# Patient Record
Sex: Male | Born: 1940 | Race: White | Hispanic: No | Smoking: Former smoker
Health system: Southern US, Community
[De-identification: ages and names within clinical notes are randomized; demographics above are authoritative.]

## PROBLEM LIST (undated history)

## (undated) DIAGNOSIS — M1990 Unspecified osteoarthritis, unspecified site: Secondary | ICD-10-CM

## (undated) DIAGNOSIS — C439 Malignant melanoma of skin, unspecified: Secondary | ICD-10-CM

## (undated) DIAGNOSIS — E785 Hyperlipidemia, unspecified: Secondary | ICD-10-CM

## (undated) DIAGNOSIS — C801 Malignant (primary) neoplasm, unspecified: Secondary | ICD-10-CM

## (undated) DIAGNOSIS — F419 Anxiety disorder, unspecified: Secondary | ICD-10-CM

## (undated) DIAGNOSIS — I4821 Permanent atrial fibrillation: Secondary | ICD-10-CM

## (undated) DIAGNOSIS — E663 Overweight: Secondary | ICD-10-CM

## (undated) DIAGNOSIS — K219 Gastro-esophageal reflux disease without esophagitis: Secondary | ICD-10-CM

## (undated) DIAGNOSIS — I700 Atherosclerosis of aorta: Secondary | ICD-10-CM

## (undated) DIAGNOSIS — I10 Essential (primary) hypertension: Secondary | ICD-10-CM

## (undated) DIAGNOSIS — J302 Other seasonal allergic rhinitis: Secondary | ICD-10-CM

## (undated) DIAGNOSIS — I495 Sick sinus syndrome: Secondary | ICD-10-CM

## (undated) HISTORY — DX: Unspecified osteoarthritis, unspecified site: M19.90

## (undated) HISTORY — DX: Gastro-esophageal reflux disease without esophagitis: K21.9

## (undated) HISTORY — PX: ARM SKIN LESION BIOPSY / EXCISION: SUR471

## (undated) HISTORY — DX: Atherosclerosis of aorta: I70.0

## (undated) HISTORY — DX: Permanent atrial fibrillation: I48.21

## (undated) HISTORY — DX: Essential (primary) hypertension: I10

## (undated) HISTORY — DX: Malignant (primary) neoplasm, unspecified: C80.1

## (undated) HISTORY — DX: Other seasonal allergic rhinitis: J30.2

## (undated) HISTORY — DX: Hyperlipidemia, unspecified: E78.5

## (undated) HISTORY — DX: Anxiety disorder, unspecified: F41.9

## (undated) HISTORY — DX: Overweight: E66.3

## (undated) HISTORY — DX: Sick sinus syndrome: I49.5

## (undated) HISTORY — DX: Malignant melanoma of skin, unspecified: C43.9

---

## 1998-10-28 ENCOUNTER — Encounter (INDEPENDENT_AMBULATORY_CARE_PROVIDER_SITE_OTHER): Payer: Self-pay | Admitting: Specialist

## 1998-10-28 ENCOUNTER — Ambulatory Visit (HOSPITAL_COMMUNITY): Admission: RE | Admit: 1998-10-28 | Discharge: 1998-10-28 | Payer: Self-pay | Admitting: Gastroenterology

## 2001-12-19 ENCOUNTER — Ambulatory Visit (HOSPITAL_COMMUNITY): Admission: RE | Admit: 2001-12-19 | Discharge: 2001-12-19 | Payer: Self-pay | Admitting: Gastroenterology

## 2001-12-19 ENCOUNTER — Encounter (INDEPENDENT_AMBULATORY_CARE_PROVIDER_SITE_OTHER): Payer: Self-pay | Admitting: Specialist

## 2003-03-28 ENCOUNTER — Ambulatory Visit (HOSPITAL_COMMUNITY): Admission: RE | Admit: 2003-03-28 | Discharge: 2003-03-29 | Payer: Self-pay | Admitting: Internal Medicine

## 2003-03-28 HISTORY — PX: PACEMAKER INSERTION: SHX728

## 2004-02-07 ENCOUNTER — Ambulatory Visit: Payer: Self-pay | Admitting: Family Medicine

## 2004-03-20 ENCOUNTER — Ambulatory Visit: Payer: Self-pay

## 2004-06-16 ENCOUNTER — Ambulatory Visit: Payer: Self-pay | Admitting: Internal Medicine

## 2004-07-02 ENCOUNTER — Ambulatory Visit: Payer: Self-pay | Admitting: Family Medicine

## 2004-09-02 ENCOUNTER — Ambulatory Visit: Payer: Self-pay | Admitting: Family Medicine

## 2004-09-15 ENCOUNTER — Ambulatory Visit: Payer: Self-pay | Admitting: Internal Medicine

## 2004-10-29 ENCOUNTER — Ambulatory Visit: Payer: Self-pay | Admitting: Family Medicine

## 2004-10-31 ENCOUNTER — Ambulatory Visit: Payer: Self-pay | Admitting: Internal Medicine

## 2004-12-25 ENCOUNTER — Ambulatory Visit: Payer: Self-pay | Admitting: Family Medicine

## 2005-02-02 ENCOUNTER — Ambulatory Visit (HOSPITAL_COMMUNITY): Admission: RE | Admit: 2005-02-02 | Discharge: 2005-02-02 | Payer: Self-pay | Admitting: Gastroenterology

## 2005-02-02 ENCOUNTER — Encounter (INDEPENDENT_AMBULATORY_CARE_PROVIDER_SITE_OTHER): Payer: Self-pay | Admitting: *Deleted

## 2005-03-03 ENCOUNTER — Ambulatory Visit: Payer: Self-pay | Admitting: Family Medicine

## 2005-03-03 ENCOUNTER — Ambulatory Visit: Payer: Self-pay | Admitting: Internal Medicine

## 2005-03-12 ENCOUNTER — Ambulatory Visit (HOSPITAL_COMMUNITY): Admission: RE | Admit: 2005-03-12 | Discharge: 2005-03-12 | Payer: Self-pay | Admitting: Family Medicine

## 2005-03-23 ENCOUNTER — Ambulatory Visit (HOSPITAL_COMMUNITY): Admission: RE | Admit: 2005-03-23 | Discharge: 2005-03-23 | Payer: Self-pay | Admitting: Family Medicine

## 2005-04-01 ENCOUNTER — Ambulatory Visit: Payer: Self-pay | Admitting: Family Medicine

## 2005-06-04 ENCOUNTER — Ambulatory Visit: Payer: Self-pay | Admitting: Internal Medicine

## 2005-08-05 ENCOUNTER — Ambulatory Visit: Payer: Self-pay | Admitting: Family Medicine

## 2005-08-31 ENCOUNTER — Ambulatory Visit: Payer: Self-pay | Admitting: Internal Medicine

## 2005-09-08 ENCOUNTER — Ambulatory Visit: Payer: Self-pay | Admitting: Family Medicine

## 2005-10-06 ENCOUNTER — Ambulatory Visit: Payer: Self-pay | Admitting: Family Medicine

## 2005-10-19 ENCOUNTER — Ambulatory Visit: Payer: Self-pay

## 2006-01-22 ENCOUNTER — Ambulatory Visit: Payer: Self-pay | Admitting: Family Medicine

## 2006-02-04 ENCOUNTER — Ambulatory Visit: Payer: Self-pay | Admitting: Family Medicine

## 2006-02-10 ENCOUNTER — Ambulatory Visit: Payer: Self-pay | Admitting: Family Medicine

## 2006-02-24 ENCOUNTER — Ambulatory Visit: Payer: Self-pay | Admitting: Family Medicine

## 2006-04-13 ENCOUNTER — Ambulatory Visit: Payer: Self-pay | Admitting: Internal Medicine

## 2006-04-16 ENCOUNTER — Ambulatory Visit: Payer: Self-pay

## 2006-05-20 ENCOUNTER — Ambulatory Visit: Payer: Self-pay | Admitting: Internal Medicine

## 2006-07-19 ENCOUNTER — Ambulatory Visit: Payer: Self-pay | Admitting: Internal Medicine

## 2006-08-10 ENCOUNTER — Ambulatory Visit: Payer: Self-pay | Admitting: Internal Medicine

## 2006-08-16 ENCOUNTER — Ambulatory Visit: Payer: Self-pay | Admitting: Internal Medicine

## 2006-09-13 ENCOUNTER — Ambulatory Visit: Payer: Self-pay | Admitting: Internal Medicine

## 2006-10-15 ENCOUNTER — Ambulatory Visit: Payer: Self-pay | Admitting: Internal Medicine

## 2006-11-13 ENCOUNTER — Ambulatory Visit: Payer: Self-pay | Admitting: Internal Medicine

## 2006-12-06 ENCOUNTER — Ambulatory Visit: Payer: Self-pay | Admitting: Internal Medicine

## 2006-12-27 ENCOUNTER — Ambulatory Visit: Payer: Self-pay | Admitting: Internal Medicine

## 2007-01-03 ENCOUNTER — Ambulatory Visit: Payer: Self-pay | Admitting: Internal Medicine

## 2007-01-31 ENCOUNTER — Ambulatory Visit: Payer: Self-pay | Admitting: Internal Medicine

## 2007-02-28 ENCOUNTER — Ambulatory Visit: Payer: Self-pay | Admitting: Internal Medicine

## 2007-03-29 ENCOUNTER — Ambulatory Visit: Payer: Self-pay | Admitting: Internal Medicine

## 2007-07-15 ENCOUNTER — Ambulatory Visit: Payer: Self-pay | Admitting: Internal Medicine

## 2007-10-18 ENCOUNTER — Ambulatory Visit: Payer: Self-pay | Admitting: Internal Medicine

## 2008-01-27 ENCOUNTER — Ambulatory Visit: Payer: Self-pay | Admitting: Internal Medicine

## 2008-02-17 ENCOUNTER — Ambulatory Visit: Payer: Self-pay | Admitting: Internal Medicine

## 2008-06-15 ENCOUNTER — Encounter: Payer: Self-pay | Admitting: Internal Medicine

## 2008-08-10 ENCOUNTER — Ambulatory Visit: Payer: Self-pay | Admitting: Internal Medicine

## 2008-08-17 ENCOUNTER — Ambulatory Visit: Payer: Self-pay | Admitting: Internal Medicine

## 2008-12-05 DIAGNOSIS — Z6833 Body mass index (BMI) 33.0-33.9, adult: Secondary | ICD-10-CM | POA: Insufficient documentation

## 2008-12-05 DIAGNOSIS — I495 Sick sinus syndrome: Secondary | ICD-10-CM | POA: Insufficient documentation

## 2008-12-05 DIAGNOSIS — Z950 Presence of cardiac pacemaker: Secondary | ICD-10-CM | POA: Insufficient documentation

## 2008-12-05 DIAGNOSIS — I10 Essential (primary) hypertension: Secondary | ICD-10-CM | POA: Insufficient documentation

## 2008-12-05 DIAGNOSIS — E663 Overweight: Secondary | ICD-10-CM

## 2008-12-15 ENCOUNTER — Ambulatory Visit: Payer: Self-pay | Admitting: Internal Medicine

## 2008-12-20 ENCOUNTER — Ambulatory Visit: Payer: Self-pay | Admitting: Internal Medicine

## 2009-03-20 ENCOUNTER — Ambulatory Visit: Payer: Self-pay | Admitting: Internal Medicine

## 2009-03-22 ENCOUNTER — Ambulatory Visit (HOSPITAL_COMMUNITY)
Admission: RE | Admit: 2009-03-22 | Discharge: 2009-03-22 | Payer: Self-pay | Source: Home / Self Care | Admitting: Family Medicine

## 2009-04-11 ENCOUNTER — Ambulatory Visit (HOSPITAL_COMMUNITY)
Admission: RE | Admit: 2009-04-11 | Discharge: 2009-04-11 | Payer: Self-pay | Source: Home / Self Care | Admitting: Family Medicine

## 2009-05-01 ENCOUNTER — Encounter: Admission: RE | Admit: 2009-05-01 | Discharge: 2009-05-01 | Payer: Self-pay | Admitting: Gastroenterology

## 2009-06-19 ENCOUNTER — Ambulatory Visit: Payer: Self-pay | Admitting: Internal Medicine

## 2009-09-18 ENCOUNTER — Ambulatory Visit: Payer: Self-pay | Admitting: Internal Medicine

## 2009-11-08 ENCOUNTER — Ambulatory Visit: Payer: Self-pay | Admitting: Cardiology

## 2009-11-20 ENCOUNTER — Encounter: Payer: Self-pay | Admitting: Internal Medicine

## 2009-12-05 ENCOUNTER — Telehealth: Payer: Self-pay | Admitting: Cardiology

## 2009-12-16 ENCOUNTER — Ambulatory Visit: Payer: Self-pay | Admitting: Cardiology

## 2009-12-24 LAB — CONVERTED CEMR LAB
Basophils Absolute: 0 10*3/uL (ref 0.0–0.1)
Eosinophils Absolute: 0.1 10*3/uL (ref 0.0–0.7)
Hemoglobin: 15.5 g/dL (ref 13.0–17.0)
Lymphocytes Relative: 20.3 % (ref 12.0–46.0)
MCHC: 34.9 g/dL (ref 30.0–36.0)
Monocytes Relative: 10.7 % (ref 3.0–12.0)
Neutro Abs: 4.2 10*3/uL (ref 1.4–7.7)
Neutrophils Relative %: 66.4 % (ref 43.0–77.0)
Platelets: 133 10*3/uL — ABNORMAL LOW (ref 150.0–400.0)
RDW: 13.9 % (ref 11.5–14.6)

## 2010-01-02 ENCOUNTER — Encounter: Payer: Self-pay | Admitting: Internal Medicine

## 2010-01-02 ENCOUNTER — Ambulatory Visit: Payer: Self-pay

## 2010-01-02 DIAGNOSIS — I4891 Unspecified atrial fibrillation: Secondary | ICD-10-CM | POA: Insufficient documentation

## 2010-01-03 ENCOUNTER — Encounter (INDEPENDENT_AMBULATORY_CARE_PROVIDER_SITE_OTHER): Payer: Self-pay | Admitting: *Deleted

## 2010-01-18 ENCOUNTER — Ambulatory Visit: Payer: Self-pay | Admitting: Internal Medicine

## 2010-03-30 ENCOUNTER — Encounter: Payer: Self-pay | Admitting: Family Medicine

## 2010-04-08 NOTE — Cardiovascular Report (Signed)
Summary: [facility]/ [facility]  [facility]/ [facility] SUMMARY   Imported By: Dorise Hiss 01/03/2010 12:28:02  _____________________________________________________________________  External Attachment:    Type:   Image     Comment:   External Document

## 2010-04-08 NOTE — Assessment & Plan Note (Signed)
Summary: ESTLAST SEEN 2007  Medications Added COUMADIN 5 MG TABS (WARFARIN SODIUM) as directed AMBIEN 10 MG TABS (ZOLPIDEM TARTRATE) as needed MULTIVITAMINS   TABS (MULTIPLE VITAMIN) 1 by mouth daily      Allergies Added: ! PENICILLIN ! SULFA  Visit Type:  Follow-up Primary Damonta Cossey:  Dr. Ardeen Garland  CC:  Atrial Fibrillation.  History of Present Illness: The patient presents for followup of the above. Since I last saw him and he was last seen by Dr. Graciela Husbands he has had no new cardiovascular problems. He denies any chest pressure, neck or arm discomfort. He has no shortness of breath, PND orthopnea. He is exercising daily. He is watching his diet and has lost 45 pounds. He has rare palpitations but none of the significant symptoms such as he had previously. He is not having any presyncope or syncope. He has had some fluctuating Coumadin levels in some trouble maintaining a therapeutic INR.  Current Medications (verified): 1)  Atenolol 100 Mg Tabs (Atenolol) .... Once Daily 2)  Diovan 80 Mg Tabs (Valsartan) .... Take One Tablet Once Daily 3)  Coumadin 5 Mg Tabs (Warfarin Sodium) .... As Directed 4)  Ambien 10 Mg Tabs (Zolpidem Tartrate) .... As Needed 5)  Multivitamins   Tabs (Multiple Vitamin) .Marland Kitchen.. 1 By Mouth Daily  Allergies (verified): 1)  ! Penicillin 2)  ! Sulfa  Past History:  Past Medical History: Reviewed history from 12/05/2008 and no changes required. 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 Pacer  -- St Jude  Review of Systems       As stated in the HPI and negative for all other systems.   Vital Signs:  Patient profile:   70 year old male Height:      65 inches Weight:      160 pounds BMI:     26.72 Pulse rate:   75 / minute Resp:     16 per minute BP sitting:    108 / 64  (right arm)  Vitals Entered By: Marrion Coy, CNA (November 08, 2009 3:41 PM)  Physical Exam  General:  Well developed, well nourished, in no acute distress. Head:  normocephalic and atraumatic Eyes:  PERRLA/EOM intact; conjunctiva and lids normal. Mouth:  Teeth, gums and palate normal. Oral mucosa normal. Neck:  Neck supple, no JVD. No masses, thyromegaly or abnormal cervical nodes. Chest Wall:  Well-healed pacemaker site Lungs:  Clear bilaterally to auscultation and percussion. Abdomen:  Bowel sounds positive; abdomen soft and non-tender without masses, organomegaly, or hernias noted. No hepatosplenomegaly. Msk:  Back normal, normal gait. Muscle strength and tone normal. Extremities:  No clubbing or cyanosis. Neurologic:  Alert and oriented x 3. Skin:  Intact without lesions or rashes. Cervical Nodes:  no significant adenopathy Inguinal Nodes:  no significant adenopathy Psych:  Normal affect.   Detailed Cardiovascular Exam  Neck    Carotids: Carotids full and equal bilaterally without bruits.      Neck Veins: Normal, no JVD.    Heart    Inspection: no deformities or lifts noted.      Palpation: normal PMI with no thrills palpable.      Auscultation: irregular rate and rhythm, S1, S2 without murmurs, rubs, gallops, or clicks.    Vascular    Abdominal Aorta: no palpable masses, pulsations, or audible bruits.      Femoral Pulses: normal femoral  pulses bilaterally.      Pedal Pulses: diminished right dorsalis pedis pulse, diminished right posterior tibial pulse, diminished left dorsalis pedis pulse, and diminished left posterior tibial pulse.      Radial Pulses: normal radial pulses bilaterally.      Peripheral Circulation: no clubbing, cyanosis, or edema noted with normal capillary refill.     EKG  Procedure date:  11/08/2009  Findings:      Atrial fibrillation, demand ventricular pacemaker, no acute ST-T wave changes  PPM Specifications Following MD:   Sherryl Manges, MD     PPM Vendor:  St Jude     PPM Model Number:  [id number]     PPM Serial Number:  [id number] DOI:  03/28/2003     PPM Implanting MD:  Sherryl Manges, MD  Lead 1    Location: [facility]     DOI: 03/28/1996     Model #: [id number]     Serial #: [id number]     Status: active Lead 2    Location: RV     DOI: 03/28/2003     Model #: 1246T     Serial #: [id number]     Status: active   Indications:  SSS   PPM Follow Up Pacer Dependent:  No      Episodes Coumadin:  Yes  Parameters Mode:  DDDR     Lower Rate Limit:  60     Upper Rate Limit:  105 Paced AV Delay:  275     Sensed AV Delay:  250  Impression & Recommendations:  Problem # 1:  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  I think this would be reasonable. He will check into the cost but his insurance. We will check his renal function. I would then be happy to switch him to this medication as he has otherwise no contraindications. This may be the safer alternative given the difficulty he reports maintaining a therapeutic INR. Orders: EKG w/ Interpretation (93000)  Problem # 2:  PACEMAKER (ICD-V45.Marland Kitchen01) He wants to continue to be followed in the evening and I will arrange this.  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 Make no change to his regimen.  Patient Instructions: 1)  Your physician recommends that you schedule a follow-up appointment in: 12 months with Dr Antoine Poche 2)  Schedule pacer follow up in [facility] office with Dr Johney Frame 3)  Your physician recommends that you continue on your current medications as directed. Please refer to the Current Medication list given to you today.

## 2010-04-08 NOTE — Progress Notes (Signed)
Summary: changed to new meds praxada  Medications Added PRADAXA 150 MG CAPS (DABIGATRAN ETEXILATE MESYLATE) 1 two times a day       Phone Note Call from Patient Call back at Home Phone [phone] Call back at Work Phone [phone]   Caller: Patient Reason for Call: Talk to Nurse Complaint: Headache Summary of Call:  pt wants to changed to new meds that was suggest by jh. pradaxa Initial call taken by: Lorne Skeens,  December 05, 2009 3:39 PM  Follow-up for Phone Call        left message for pt on voicemail that it is ok to start Pradaxa and to call the office back so that we can call in the rx schedule his blood work.  pt returned call -pls call [phone] Glynda Jaeger  December 06, 2009 9:15 AM  Follow-up by: Charolotte Capuchin, RN,  December 05, 2009 6:10 PM  Additional Follow-up for Phone Call Additional follow up Details #1::        lmtcb Scherrie Bateman, LPN  December 06, 2009 10:34 AM  PT AWARE AND LABS  SCHEDULED FOR 12/07/09 AT 9:00 AM Additional Follow-up by: Scherrie Bateman, LPN,  December 06, 2009 10:58 AM    New/Updated Medications: PRADAXA 150 MG CAPS (DABIGATRAN ETEXILATE MESYLATE) 1 two times a day Prescriptions: PRADAXA 150 MG CAPS (DABIGATRAN ETEXILATE MESYLATE) 1 two times a day  #60 x 11   Entered by:   Scherrie Bateman, LPN   Authorized by:   Rollene Rotunda, MD, [facility]   Signed by:   Scherrie Bateman, LPN on 07/37/1062   Method used:   Electronically to        [facility] (retail)       [address]       [place], [place]  [address]       Ph: [phone] or [phone]       Fax: [phone]   RxID:   [id number]

## 2010-04-08 NOTE — Cardiovascular Report (Signed)
Summary: TTM   TTM   Imported By: Roderic Ovens 04/03/2009 10:43:54  _____________________________________________________________________  External Attachment:    Type:   Image     Comment:   External Document

## 2010-04-08 NOTE — Cardiovascular Report (Signed)
Summary: TTM   TTM   Imported By: Roderic Ovens 10/04/2009 08:51:23  _____________________________________________________________________  External Attachment:    Type:   Image     Comment:   External Document

## 2010-04-08 NOTE — Assessment & Plan Note (Signed)
Summary: DEVICE/PT TO SEEN IN [facility]      Allergies Added:   Visit Type:  Pacemaker check Primary Provider:  Dr. Ardeen Garland   History of Present Illness: The patient presents today for routine electrophysiology followup. He reports doing very well since last being seen in our clinic. The patient denies symptoms of palpitations, chest pain, shortness of breath, orthopnea, PND, lower extremity edema, dizziness, presyncope, syncope, or neurologic sequela. The patient is tolerating medications without difficulties and is otherwise without complaint today.   Preventive Screening-Counseling & Management  Alcohol-Tobacco     Smoking Status: quit     Year Quit: 1995  Current Medications (verified): 1)  Atenolol 100 Mg Tabs (Atenolol) .... Once Daily 2)  Diovan 80 Mg Tabs (Valsartan) .... Take One Tablet Once Daily 3)  Pradaxa 150 Mg Caps (Dabigatran Etexilate Mesylate) .Marland Kitchen.. 1 Two Times A Day 4)  Ambien 10 Mg Tabs (Zolpidem Tartrate) .... As Needed 5)  Multivitamins   Tabs (Multiple Vitamin) .Marland Kitchen.. 1 By Mouth Daily  Allergies (verified): 1)  ! Penicillin 2)  ! Sulfa  Comments:  Nurse/Medical Assistant: The patient's medications and allergies were verbally reviewed with the patient and were updated in the Medication and Allergy Lists.  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 Pacer  -- St Jude  Social History: Reviewed history from 12/05/2008 and no changes required. Full Time Widowed  Tobacco Use - No.  Smoking Status:  quit  Review of Systems       All systems are reviewed and negative except as listed in the HPI.   Vital Signs:  Patient profile:   70 year old male Height:      65 inches Weight:       165 pounds Pulse rate:   61 / minute BP sitting:   121 / 79  (left arm) Cuff size:   large  Vitals Entered By: [name] (January 02, 2010 2:22 PM)  Physical Exam  General:  Well developed, well nourished, in no acute distress. Head:  normocephalic and atraumatic Eyes:  PERRLA/EOM intact; conjunctiva and lids normal. Mouth:  Teeth, gums and palate normal. Oral mucosa normal. Neck:  supple Chest Wall:  R sided pacemaker is well healed Lungs:  Clear bilaterally to auscultation and percussion. Heart:  RRR (paced), no m/r/g Abdomen:  Bowel sounds positive; abdomen soft and non-tender without masses, organomegaly, or hernias noted. No hepatosplenomegaly. Msk:  Back normal, normal gait. Muscle strength and tone normal. Pulses:  pulses normal in all 4 extremities Extremities:  No clubbing or cyanosis. Neurologic:  Alert and oriented x 3.   PPM Specifications Following MD:  Sherryl Manges, MD     PPM Vendor:  St Jude     PPM Model Number:  [id number]     PPM Serial Number:  [id number] PPM DOI:  03/28/2003     PPM Implanting MD:  Sherryl Manges, MD  Lead 1    Location: RA     DOI: 03/28/1996     Model #: 1242T     Serial #: [id number]     Status: active Lead 2    Location: RV     DOI: 03/28/2003     Model #: 1246T     Serial #: [id number]     Status: active   Indications:  SSS  PPM Follow Up Battery Voltage:  2.76 V     Battery Est. Longevity:  5-7 yrs     Pacer Dependent:  No       PPM Device Measurements Atrium  Amplitude: 3.9 mV, Impedance: 418 ohms,  Right Ventricle  Amplitude: 16.6 mV, Impedance: 770 ohms, Threshold: 0.375 V at 0.4 msec  Episodes MS Episodes:  18     Percent Mode Switch:  >99%     Coumadin:  No Ventricular High Rate:  0     Ventricular Pacing:  1.1%  Parameters Mode:  DDDR     Lower Rate Limit:  60     Upper Rate Limit:  105 Paced AV Delay:  275     Sensed AV Delay:  250 Next Cardiology Appt Due:  07/08/2010 Tech Comments:  PT IN AF 99% OF TIME. + PRADAXA. NORMAL  DEVICE FUNCTION.  NO CHANGES MADE. ROV IN 6 MTHS W/DEVICE CLINIC. 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

## 2010-04-08 NOTE — Cardiovascular Report (Signed)
Summary: TTM   TTM   Imported By: Roderic Ovens 07/10/2009 09:59:51  _____________________________________________________________________  External Attachment:    Type:   Image     Comment:   External Document

## 2010-04-08 NOTE — Letter (Signed)
Summary: Appointment- Rescheduled  [facility] [address]   [place], [place] [address]   Phone: [phone]  Fax: [phone]     November 20, 2009 MRN: [id number]     Peter Howe [address] [place], [place]  [address]     Dear Mr. PICKUP,   Due to a change in our office schedule, your appointment time on   January 02, 2010 at 1:00 must be changed to January 02, 2010 at 2:15.   We look forward to participating in your health care needs.      Sincerely,  Glass blower/designer

## 2010-04-08 NOTE — Miscellaneous (Signed)
Summary: dx code correction   Clinical Lists Changes  Problems: Changed problem from PACEMAKER (ICD-V45.Marland Kitchen01) to PACEMAKER, PERMANENT (ICD-V45.01)  changed the incorrect dx code to correct dx code Genella Mech  January 03, 2010 11:25 AM

## 2010-04-08 NOTE — Cardiovascular Report (Signed)
Summary: TTM   TTM   Imported By: Roderic Ovens 04/10/2009 10:17:27  _____________________________________________________________________  External Attachment:    Type:   Image     Comment:   External Document

## 2010-05-31 ENCOUNTER — Encounter: Payer: Self-pay | Admitting: Internal Medicine

## 2010-05-31 DIAGNOSIS — I495 Sick sinus syndrome: Secondary | ICD-10-CM

## 2010-07-22 NOTE — Assessment & Plan Note (Signed)
[facility]                         ELECTROPHYSIOLOGY OFFICE NOTE   MARKAIL, DIEKMAN                     MRN:          [id number]  DATE:12/27/2006                            DOB:          11-Oct-1940    SUBJECTIVE:  Mr. Granito is seen for paroxysmal atrial fibrillation and  bradycardia, status post pacemaker implantation.  He has also had  significant fatigue and we tried a variety of different beta blockers  over-the-counter in 2008, and he seemed to like atenolol the best.  He  is currently taking 100 mg daily.   He continues to have short episodes of atrial fibrillation.  They are  modestly symptomatic.   PHYSICAL EXAMINATION:  VITAL SIGNS:  Blood pressure today 120/59, pulse  57.  LUNGS:  Clear.  HEART:  Sounds regular.  EXTREMITIES:  Without edema.   Interrogation of his St. Jude pulse generator demonstrated normal  pacemaker function with a battery voltage of 2.76.  The atrial lead  impedance was 445, ventricular lead impedance was 889.  RA amplitude was  5.8 with an RV amplitude of 16.6 with a threshold in the AV of 1volt at  0.5 and the RV at 0.25 at 0.4.  Battery voltage was 2.76.   IMPRESSION:  1. Paroxysmal atrial fibrillation.  2. Bradycardia.  3. Status post pacer for the above.   PLAN:  I have re-programmed his device to try and track down ventricular  rates during his atrial fibrillation.  I have also told him to take an  extra 1/2 of atenolol, in the event that he has recurrent symptomatic  tachy palpitations.   FOLLOWUP:  We will see him again in six months' time in the Device  Clinic in [place].     Duke Salvia, MD, [facility]  Electronically Signed    SCK/MedQ  DD: 12/27/2006  DT: 12/28/2006  Job #: [id number]   cc:   Delaney Meigs, M.D.

## 2010-07-22 NOTE — Cardiovascular Report (Signed)
[facility]                   [facility] ELECTROPHYSI[facility]Y DEVICE CLINIC NOTE   BILLYJOE, GO                     MRN:          [id number]  DATE:08/10/2008                            DOB:          20-Apr-1940    Mr. Fulop is seen in followup for a pacemaker implanted for sick sinus  syndrome and chronotropic incompetence.  His major complaint is dyspnea  on exertion which he thinks is getting somewhat worse.   MEDICATIONS:  1. Atenolol 100.  2. Diovan 80.  3. Coumadin.  4. Colchicine 0.6.   PHYSICAL EXAMINATION:  VITAL SIGNS:  His weight was 204 which is up 10  pounds, his blood pressure 137/83, and his pulse was 59.  LUNGS:  Clear.  HEART:  Heart sounds were regular.  ABDOMEN:  Soft.  EXTREMITIES:  Without edema.  SKIN:  Warm and dry.   Interrogation of St. Jude pacemaker demonstrated a P-wave of 5.1 with  impedance of 417 and threshold of 0.75 at 0.5.  The R-wave was 16.6 with  impedance of 899 and threshold of 0.25 at 0.5.  Battery voltage is 2.76.  He is atrially paced 100% of the time.  Heart rate excursion was blunted  and 98% of his beats were less than 80 beats per minute.  Because of  this, I decreased his threshold from auto to auto -0.5 and his slope  from auto to auto +1.   IMPRESSION:  1. 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  2. Status post pacer for the above.  3. Hypertension.  4. Obesity with recent increase in weight.   Mr. Schum is doing okay.  I am concerned about his weight.  I am also  concerned about the heart rate response of his pacemaker and he is a  100% Paced.  I do not think changing his beta-blocker will make that  much difference.  To that end, I have reprogrammed his device as noted.  He is to walk around a little bit, and give [place] a call and let us know  how he is doing.  I will plan on seeing him in 6 months' time.     Duke Salvia, MD, [facility]  Electronically Signed   SCK/MedQ  DD: 08/10/2008  DT: 08/11/2008  Job #: [id number]

## 2010-07-22 NOTE — Letter (Signed)
August 10, 2006    Peter Howe, M.D.  [address]  [place], [place] [address]   RE:  Peter Howe, Peter Howe  MRN:  [id number]  /  DOB:  1940-11-07   Dear Peter Howe:   Peter Howe comes in.  He has paroxysmal atrial fibrillation and  hypertension.  He has been having some problems with his medications and  some significant fatigue.  When I saw him last I gave him prescriptions  for alternative beta blockers, of which he tried atenolol only, which  was better than the Toprol.  We also, because of his hypertension, put  him on Diovan, which he is tolerating pretty well at this point.   PHYSICAL EXAMINATION:  VITAL SIGNS:  Blood pressure today was much  better controlled at 138/72, pulse 72.  LUNGS:  Clear.  HEART:  Sounds were regular.  EXTREMITIES:  Without peripheral edema.   Interrogation of his St. Jude Identity pulse generator demonstrates a P-  wave of  3 with impedance of 435 at a threshold of 1 volt at 0.4.  The R-  wave was 8 with impedance of 861, threshold of 0.25 at 0.4.  There were  no inter-current episodes.   IMPRESSION:  1. Atrial fibrillation as in paroxysmal with a rapid ventricular      response.  2. Sinus node dysfunction with previously-implanted pacemaker.  3. Fatigue.  4. Hypertension.  5. Obesity.   PLAN:  Peter Howe secondary risk factor modification is key.  As it  relates to his beta blockers, he seems to be tolerating the atenolol  better than the Toprol.  If the fatigue does not resolve, alternatives  include Inderal and Nadolol or bisoprolol.   His blood pressure is much improved with a diet, and will continue him  on that.   His device was re-programmed.   Will also try to correlate some of his days with his paroxysmal atrial  fibrillation, many of the episodes of which are quite protracted.  He  will take his pulse on a regular basis, and we will plan to see him in  about three or four months and see if there is a correlation between his  atrial  fibrillation and his symptoms.    Sincerely,      Peter Salvia, MD, [facility]  Electronically Signed    SCK/MedQ  DD: 08/10/2006  DT: 08/10/2006  Job #: [id number]

## 2010-07-22 NOTE — Cardiovascular Report (Signed)
[facility]                   [facility] ELECTROPHY[facility] DEVICE CLINIC NOTE   Peter, Howe                     MRN:          [id number]  DATE:07/15/2007                            DOB:          01/25/1941    [name] is seen in followup for pacemaker implanted for bradycardia  that was secondary to paroxysmal atrial fibrillation.  He continues to  have some episodes of atrial fibrillation, indeed 9% of the time.  We do  not have ventricular rates with this.  He does take extra half atenolol  if he is having a prolonged spell.   His other medications include Coumadin and Diovan.   PHYSICAL EXAMINATION:  VITAL SIGNS:  His blood pressure is 134/82.  His  pulse is 60.  LUNGS:  Clear.  HEART:  Sounds were regular.  EXTREMITIES:  Without edema.   Interrogation of his St. Jude Pulse Generator demonstrates a P wave of 5  with impedance of 410, a threshold of 1 volt at 0.4.  The R wave was 16  with impedance of 825, a threshold 0.375 at 0.4.  Battery voltage was  2.76.  He is 99% atrial paced.   IMPRESSION:  1. Tachybrady syndrome.  2. Paroxysmal atrial fibrillation.  3. Status post pacemaker for the above.  4. Hypertension.   [name] is stable.  We will see him again in 6 months' time.     Duke Salvia, MD, [facility]  Electronically Signed    SCK/MedQ  DD: 07/15/2007  DT: 07/15/2007  Job #: [id number]   cc:   Delaney Meigs, M.D.

## 2010-07-24 ENCOUNTER — Encounter: Payer: Self-pay | Admitting: Internal Medicine

## 2010-07-24 ENCOUNTER — Encounter: Payer: Self-pay | Admitting: *Deleted

## 2010-07-24 ENCOUNTER — Ambulatory Visit (INDEPENDENT_AMBULATORY_CARE_PROVIDER_SITE_OTHER): Payer: Medicare Other | Admitting: Internal Medicine

## 2010-07-24 DIAGNOSIS — Z950 Presence of cardiac pacemaker: Secondary | ICD-10-CM

## 2010-07-24 DIAGNOSIS — I4891 Unspecified atrial fibrillation: Secondary | ICD-10-CM

## 2010-07-24 DIAGNOSIS — I10 Essential (primary) hypertension: Secondary | ICD-10-CM

## 2010-07-24 DIAGNOSIS — I495 Sick sinus syndrome: Secondary | ICD-10-CM

## 2010-07-24 NOTE — Assessment & Plan Note (Signed)
Stable No change required today  

## 2010-07-24 NOTE — Assessment & Plan Note (Signed)
Rate controlled permanent atrial fibrillation Continue pradaxa for stroke prevention No changes

## 2010-07-24 NOTE — Assessment & Plan Note (Signed)
Normal pacemaker function See Pace Art report No changes today  

## 2010-07-24 NOTE — Progress Notes (Signed)
The patient presents today for routine electrophysiology followup.  Since last being seen in our clinic, the patient reports doing very well.  Today, he denies symptoms of palpitations, chest pain, shortness of breath, orthopnea, PND, lower extremity edema, dizziness, presyncope, syncope, or neurologic sequela.  The patient feels that he is tolerating medications without difficulties and is otherwise without complaint today.   Past Medical History  Diagnosis Date  . Sick sinus syndrome with tachycardia   . Hypertension     malignant uncontrolled  . Persistent atrial fibrillation     permanent  . Overweight     obesity  . Cardiac pacemaker    Past Surgical History  Procedure Date  . Insert / replace / remove pacemaker     St. Jude-PPM    Current Outpatient Prescriptions  Medication Sig Dispense Refill  . atenolol (TENORMIN) 100 MG tablet Take 100 mg by mouth daily.        . dabigatran (PRADAXA) 150 MG CAPS Take 150 mg by mouth every 12 (twelve) hours.        . Multiple Vitamin (MULTIVITAMIN) tablet Take 1 tablet by mouth daily.        . valsartan (DIOVAN) 80 MG tablet Take 80 mg by mouth daily.        Marland Kitchen zolpidem (AMBIEN) 10 MG tablet Take 10 mg by mouth at bedtime as needed.          Allergies  Allergen Reactions  . Penicillins   . Sulfonamide Derivatives     History   Social History  . Marital Status: Widowed    Spouse Name: N/A    Number of Children: N/A  . Years of Education: N/A   Occupational History  .      FULL TIME   Social History Main Topics  . Smoking status: Former Smoker -- 1.0 packs/day for 30 years    Types: Cigarettes    Quit date: 03/09/1993  . Smokeless tobacco: Not on file  . Alcohol Use: No  . Drug Use: No  . Sexually Active: Not on file   Other Topics Concern  . Not on file   Social History Narrative   Owns a Materials engineer, enjoys golf    Physical Exam: Filed Vitals:   07/24/10 1608  BP: 112/75  Pulse: 67  Height: 5\' 5"  (1.651  m)  Weight: 181 lb (82.101 kg)  SpO2: 97%    GEN- The patient is well appearing, alert and oriented x 3 today.   Head- normocephalic, atraumatic Eyes-  Sclera clear, conjunctiva pink Ears- hearing intact Oropharynx- clear Neck- supple, no JVP Lymph- no cervical lymphadenopathy Lungs- Clear to ausculation bilaterally, normal work of breathing Chest- pacemaker pocket is well healed Heart- Regular rate and rhythm, no murmurs, rubs or gallops, PMI not laterally displaced GI- soft, NT, ND, + BS Extremities- no clubbing, cyanosis, or edema MS- no significant deformity or atrophy Skin- no rash or lesion Psych- euthymic mood, full affect Neuro- strength and sensation are intact  Pacemaker interrogation- reviewed in detail today,  See PACEART report  Assessment and Plan:

## 2010-07-25 NOTE — Op Note (Signed)
Peter Howe, Peter Howe              ACCOUNT NO.:  [id number]   MEDICAL RECORD NO.:  [id number]          PATIENT TYPE:  AMB   LOCATION:  ENDO                         FACILITY:  [facility]   PHYSICIAN:  John C. Madilyn Fireman, M.D.    DATE OF BIRTH:  1940-04-28   DATE OF PROCEDURE:  02/02/2005  DATE OF DISCHARGE:                                 OPERATIVE REPORT   INDICATIONS FOR PROCEDURE:  History of adenomatous colon polyps on previous  study 3 years ago.   PROCEDURE:  The patient was placed in the left lateral decubitus position  then placed on the pulse monitor with continuous low-flow oxygen delivered  by nasal cannula. He was sedated with 50 mcg IV fentanyl and 5 mg IV Versed.  The Olympus video colonoscope was inserted into the rectum and advanced to  the cecum, confirmed by transillumination at McBurney's point and  visualization at the ileocecal valve and appendiceal orifice. The prep was  excellent. The cecum appeared normal with no masses, polyps, diverticula or  other mucosal abnormalities. Within the ascending colon there were seen.  There was an 8 mm polyp that was removed by snare. The remainder of the  ascending, transverse, descending and sigmoid colon appeared normal with no  further polyps, masses, diverticula or other mucosal abnormalities. The  scope was then withdrawn and the patient returned to the recovery room in  stable condition. She tolerated the procedure well and there were no  immediate complications.   IMPRESSION:  Ascending colon polyp, otherwise normal study.   PLAN:  Will await histology and probably repeat colonoscopy in 5 years.           ______________________________  Everardo All Madilyn Fireman, M.D.     JCH/MEDQ  D:  02/02/2005  T:  02/02/2005  Job:  [id number]   cc:   Delaney Meigs, M.D.  Fax: [phone]

## 2010-07-25 NOTE — Op Note (Signed)
   NAME:  Peter Howe, Peter Howe                        ACCOUNT NO.:  [id number]   MEDICAL RECORD NO.:  [id number]                   PATIENT TYPE:  AMB   LOCATION:  ENDO                                 [facility]ACILITY:  [facility]   PHYSICIAN:  John C. Madilyn Fireman, M.D.                 DATE OF BIRTH:  [birth year]   DATE OF PROCEDURE:  12/19/2001  DATE OF DISCHARGE:                                 OPERATIVE REPORT   PROCEDURE:  Colonoscopy with polypectomy.   INDICATIONS FOR PROCEDURE:  History of adenomatous colon polyps three years  ago.   DESCRIPTION OF PROCEDURE:  The patient was placed in the left lateral  decubitus position then placed on the pulse monitor with continuous low flow  oxygen delivered by nasal cannula. He was sedated with 100 mg IV Demerol and  10 mg IV Versed. The Olympus video colonoscope was inserted into the rectum  and advanced as far as possible but despite multiple position changes,  torquing maneuver and abdominal pressure, the cecum could not be reached.  When the scope was advanced to its furthest, I could see the ileocecal valve  in the distance and it was felt that visualization was accomplished to about  the mid ascending colon. The mucosa in this area appeared normal with the  exception of an 8 mm polyp in the distal ascending colon which was removed  by snare. The transverse, descending, sigmoid and rectum appeared normal  with no further polyps, masses, diverticula or other mucosal abnormalities.  The rectum likewise appeared normal and retroflexed view of the anus  revealed no obvious internal hemorrhoids. The colonoscope was then withdrawn  and the patient returned to the recovery room in stable condition. He  tolerated the procedure well and there were no immediate complications.   IMPRESSION:  1. Ascending colon polyp.  2. Incomplete colonoscopy with failure to see the proximal ascending colon     and the cecum.   PLAN:  Await histology and will probably repeat  colonoscopy in three years.                                               John C. Madilyn Fireman, M.D.    JCH/MEDQ  D:  12/19/2001  T:  12/19/2001  Job:  [id number]

## 2010-07-25 NOTE — Letter (Signed)
April 13, 2006    Delaney Meigs, M.D.  [address]  [place], [place] [address]   RE:  ILIAN, WESSELL  MRN:  [id number]  /  DOB:  04-07-1940   Dear Dr. Lysbeth Galas:   Mr. Pitsenbarger comes in.  He had a spell the other day where while playing  golf he was lightheaded.  He was having prolonged indigestion and  irregular heart beats.  There was a spell while he was driving where he  became a little bit confused as to his location.   He has no known coronary artery disease.  He does have hypertension.  His last Cardiolite was about 6 or 7 years ago.   These episodes of indigestion occur periodically and they seem to track  with his rapid heart beats.   Interrogation of his pacemaker demonstrated that he has had a mode  switch about 5.9% of the time and that his ventricular response in the  one electrogram that we have runs about 150 beats per minute.   Reviewing his medications demonstrates that he is no longer on an ARB.  Apparently he developed a dermatitis that was felt to be a drug-related  process.  Medications were gradually eliminated and he is currently only  taking Coumadin, Toprol at 50 mg, and Zegerid.   PHYSICAL EXAMINATION:  VITAL SIGNS:  On examination today, his blood  pressure was pretty good at 120/70 and his pulse is 60.  LUNGS:  Clear.  CARDIAC:  Heart sounds were regular.  EXTREMITIES:  Without edema.   Interrogation of his St. Jude pulse generator demonstrated an R wave of  12.2 and an impedance of 825, a threshold of 0.25 at 0.4 with a P wave  of 5, an impedance of 406, and a threshold of 1 V at 0.4.  Battery  voltage is 2.76.   IMPRESSION:  1. Paroxysmal atrial fibrillation with lightheadedness, shortness of      breath and chest discomfort.  2. Bradycardia, status post pacemaker implantation.  3. Obesity.  4. Hypertension.   Mr. Mcphearson has multiple risk factors.  We do not have recent lipids, but  he otherwise meets criteria for metabolic syndrome.  I  should note  parenthetically, in 1998 his HDL was 90.  Given his chest discomfort  which he describes as indigestion associated with these rapid spells, I  think a Myoview scan is appropriate with these risk factors.   I have also increased his Toprol from 50 mg to 100 mg a day.  I would  like to see him again in 5 weeks' time to see how it is that he is doing  both symptomatically as well as by interrogation of his device, looking  at the rates of his ventricular response.    Sincerely,      Duke Salvia, MD, [facility]  Electronically Signed    SCK/MedQ  DD: 04/13/2006  DT: 04/13/2006  Job #: [id number]

## 2010-07-25 NOTE — Op Note (Signed)
NAME:  Peter Howe, Peter Howe                        ACCOUNT NO.:  [id number]   MEDICAL RECORD NO.:  [id number]                   PATIENT TYPE:  OIB   LOCATION:  3731                                 FACILITY:  [facility]   PHYSICIAN:  Duke Salvia, M.D.               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   OPERATION PERFORMED:  Explantation of a previously implanted device,  implantation of a new pulse generator with insertion of a new ventricular  lead.   DESCRIPTION OF PROCEDURE:  Following the obtaining of informed consent, the  patient was brought to the electrophysiology laboratory and placed on the  fluoroscopic table in supine position.  After routine prep and drape,  lidocaine was infiltrated along the line of the previous incision and  carried down to the layer of the pacemaker pocket using sharp dissection and  electrocautery.  The pocket was opened and with some difficulty, the  pacemaker device was explanted.  While the head had secured medially with a  very loose suture so that the device had rotated 180 degrees and the leads  were at the caudal portion of the pocket, thus freeing up the leads was a  little bit difficult.  Upon inspection, it was clear that there was old  blood in the ventricular lead which was visible over the entire portion of  the visible lead.  At this point it was elected to insert a new ventricular  lead.  At that point attention was turned to gaining access to the  extrathoracic right subclavian vein.  Using the previously implanted leads  as markers, the vein was cannulated on a number of occasions;  however, wires  could not be passed past the midportion of the superior vena cava because  there was a cephalad direction of the junction of the subclavian vein and  the SVC, such that the wire could go up into the bulb as it were, curl up  and I could not thereafter advance the wire.  We tried a Scientist, research (medical), a  standard 035 wire and then a __________ wire and ultimately I was able to  get the __________ wire into the RV with the coil __________  as I went  through the lead the loop was removed.  We then put in a 7 [place] hemostatic  tear-away sheath and through this was placed a St. Jude 52 cm passive  fixation ventricular lead, serial [id number].  Under fluoroscopic guidance it  was manipulated to the right ventricular apex which was actually about 1 cm  lateral to the previously implanted lead.  In this location the bipolar R  wave was 17.5 mV with pacing impedance of 1024 ohms, pacing threshold of 0.5  V at 0.5 msec with a current at threshold of 0.4 MA.  There is no  diaphragmatic pacing at 10 volts.  This lead was then secured to the  prepectoral fascia.  The pocket had to be expanded laterally to allow access  to the vein and hemostasis was then obtained.  The leads were then attached  to a St. Jude Identity XL pulse generator, serial number [id number].  AV pacing  with pseudofusion was identified. The pocket was copiously irrigated with  antibiotic containing saline solution.  Hemostasis was assured and great  attention was given to this, given that the patient's INR was 2.3.  The  pocket was then closed in three layers in  normal fashion. The wound was washed, dried and benzoin and Steri-Strip  dressing was applied.  Sponge, needle and instrument counts were correct at  the end of the procedure according to the staff.  The patient tolerated the  procedure without apparent complication.                                               Duke Salvia, M.D.    SCK/MEDQ  D:  03/28/2003   T:  03/28/2003  Job:  [id number]   cc:   [facility]   Delaney Meigs, M.D.  [address]  [place]  [place] [address]  Fax: [phone]

## 2010-07-25 NOTE — Discharge Summary (Signed)
NAME:  Peter Howe, Peter Howe                        ACCOUNT NO.:  [id number]   MEDICAL RECORD NO.:  [id number]                   PATIENT TYPE:  OIB   LOCATION:  3731                                 FACILITY:  [facility]   PHYSICIAN:  Duke Salvia, M.D.               DATE OF BIRTH:  Apr 25, 1940   DATE OF ADMISSION:  03/28/2003  DATE OF DISCHARGE:  03/29/2003                                 DISCHARGE SUMMARY   PRIMARY DIAGNOSIS:  Pacemaker placement at __________   SECONDARY DIAGNOSES:  1. Tachy/brady syndrome, status post St. Jude pacemaker.  2. Atrial fibrillation.   HISTORY OF PRESENT ILLNESS:  This is a 70 year old gentleman with a past  medical history as stated above, who is status post pacer implant for  bradycardia in the context of PAF.  He has thromboembolic risk factors  notable for hypertension and takes Coumadin therapy.   HOSPITAL COURSE:  He was admitted for a generator change.  Also, the patient  had a right ventricular lead revision.  He tolerated the procedure well and  had no immediate complications.  He was discharged to home the following day  in stable condition on all of his previous medications.   DISCHARGE MEDICATIONS:  1. Toprol XL 75 mg daily.  2. Avapro 150 mg daily.  3. Coumadin 5 mg nightly.  4. Multivitamins daily.  5. Lipitor 5 mg nightly.  6. Aciphex 20 mg daily.  7. Tylenol one to two tablets every four to six hours as needed.   ACTIVITY:  Per pacemaker discharge sheet.  The patient was restricted from  driving for approximately 10 days.   WOUND CARE:  Per pacemaker discharge sheet.   FOLLOWUP:  He is to follow up at the pacemaker clinic at [facility] on April 12, 2003, at 9 a.m. and with Dr. Graciela Husbands on June 29, 2003, at 10:45 a.m.      Chinita Pester, C.R.N.P. [facility]                 Duke Salvia, M.D.    DS/MEDQ  D:  03/29/2003  T:  03/30/2003  Job:  [id number]   cc:   Delaney Meigs, M.D.  [address]  [place]  [place] [address]  Fax:  [phone]

## 2010-07-25 NOTE — Assessment & Plan Note (Signed)
[facility]                           ELECTROPHYSIOLOGY OFFICE NOTE   KERVENS, ROPER                     MRN:          [id number]  DATE:10/19/2005                            DOB:          [birth year]    Peter Howe was seen on October 19, 2005, in the clinic for followup of his  St. Jude Model No. [id number] Identity.  Date of implant was March 28, 2003, for  sick sinus syndrome.  On interrogation of his device today, his battery  voltage was 2.76.  P waves measured greater than 5 mV with an atrial capture  threshold of 1 V at 0.4 msec and an atrial lead impedence of 418 ohms.  R  waves measured greater than 12.5 mV with a ventricular pacing threshold of  0.25 mV at 0.4 msec and a ventricular lead impedence of 785 ohms.  There  were 2463 mode switches noted totally 4.5% of the time and no changes were  made in his parameters today and auto capture is on.  He does do  transtelephonic monitoring on a monthly basis and will continue with a  return office visit in 1 year's time.                                   Altha Harm, LPN                                Duke Salvia, MD, [facility]   PO/MedQ  DD:  10/19/2005  DT:  10/19/2005  Job #:  [id number]

## 2010-07-25 NOTE — Assessment & Plan Note (Signed)
[facility]                         ELECTROPHYSIOLOGY OFFICE NOTE   Peter Howe, Peter Howe                     MRN:          [id number]  DATE:05/20/2006                            DOB:          Jan 04, 1941    Mr. Barocio is seen for paroxysmal atrial fibrillation.  He was having a  drug reaction before, and so his medications were stopped including  Avapro.  We increased his Toprol from 50 to 100 mg a day, and he is  feeling some better on the current medical regimen except he is more  tired.  A review of his medicines demonstrates that he is currently on  metoprolol now at 100 mg a day, Prilosec and Coumadin.   PHYSICAL EXAMINATION:  VITAL SIGNS: Blood pressure 160/88 which is high  for him.  Pulse is 59.  Weight was 205.  LUNGS:  Clear.  CARDIAC:  Heart sounds were regular.   Interrogation of his pacemaker demonstrates that he is in atrial  fibrillation about 6.9% of the time, and the episodes of atrial high  rate are consistent with ventricular high rate at the same time.   IMPRESSION:  1. Paroxysmal atrial fibrillation.  2. Bradycardia status post pacer.  3. Drug reaction, question medication, question Avapro, question      generic metoprolol succinate.  4. Hypertension.  Worse control since discontinuation of Avapro.   We will plan to give Mr. Aiello a prescription for Diovan 80, not to  take yet but to go and get his blood pressure checked at the local  pharmacy and see if it is running high.  If it is running consistently  over 140, I would like him to begin the Diovan 80 and then to follow up  with Dr. Lysbeth Galas in about two weeks' time.   He is going to continue on his current dose of Toprol to see if he can  adjust to the fatigogenic component; in the event that he does not, I  have given him a prescription for atenolol 100 and Inderal LA 120 to  take as an alternative.  He is to let us know if he changes his Toprol  to either one of  these 2-week prescriptions.   I will see him again in 12 weeks' time.     Duke Salvia, MD, [facility]  Electronically Signed   SCK/MedQ  DD: 05/20/2006  DT: 05/22/2006  Job #: [id number]   cc:   Delaney Meigs, M.D.

## 2010-10-04 ENCOUNTER — Encounter: Payer: Self-pay | Admitting: Internal Medicine

## 2010-10-04 DIAGNOSIS — I495 Sick sinus syndrome: Secondary | ICD-10-CM

## 2010-11-11 ENCOUNTER — Encounter: Payer: Self-pay | Admitting: Cardiology

## 2010-11-12 ENCOUNTER — Ambulatory Visit (INDEPENDENT_AMBULATORY_CARE_PROVIDER_SITE_OTHER): Payer: Medicare Other | Admitting: Cardiology

## 2010-11-12 ENCOUNTER — Encounter: Payer: Self-pay | Admitting: Cardiology

## 2010-11-12 DIAGNOSIS — E663 Overweight: Secondary | ICD-10-CM

## 2010-11-12 DIAGNOSIS — I4891 Unspecified atrial fibrillation: Secondary | ICD-10-CM

## 2010-11-12 DIAGNOSIS — I10 Essential (primary) hypertension: Secondary | ICD-10-CM

## 2010-11-12 NOTE — Patient Instructions (Addendum)
Follow up as needed with Dr Antoine Poche  The current medical regimen is effective;  continue present plan and medications.

## 2010-11-12 NOTE — Assessment & Plan Note (Signed)
I discussed his weight with him and we discussed the importance of exercise and calorie control.

## 2010-11-12 NOTE — Assessment & Plan Note (Signed)
The blood pressure is at target. No change in medications is indicated. We will continue with therapeutic lifestyle changes (TLC).  

## 2010-11-12 NOTE — Assessment & Plan Note (Signed)
He tolerates this rhythm and rate control and anticoagulation. We will continue with the meds as listed.  He can follow with Dr. Johney Frame since afib and a pacemaker is the only active issue.

## 2010-11-12 NOTE — Progress Notes (Signed)
HPI The patient presents for followup of hypertension and tachycardia bradycardia syndrome. He does not notice that he is in atrial fibrillation. He tolerates Pradaxa.  He denies any chest pressure, neck or arm discomfort. He has no shortness of breath, PND or orthopnea. He does exercise 3 times per week. His weight is up and he admits to some indiscriminate eating.  Allergies  Allergen Reactions  . Penicillins   . Sulfonamide Derivatives     Current Outpatient Prescriptions  Medication Sig Dispense Refill  . atenolol (TENORMIN) 100 MG tablet Take 100 mg by mouth daily.        . dabigatran (PRADAXA) 150 MG CAPS Take 150 mg by mouth every 12 (twelve) hours.        . Multiple Vitamin (MULTIVITAMIN) tablet Take 1 tablet by mouth daily.        . valsartan (DIOVAN) 80 MG tablet Take 80 mg by mouth daily.        Marland Kitchen zolpidem (AMBIEN) 10 MG tablet Take 10 mg by mouth at bedtime as needed.          Past Medical History  Diagnosis Date  . Sick sinus syndrome with tachycardia   . Hypertension     malignant uncontrolled  . Persistent atrial fibrillation     permanent  . Overweight     obesity  . Cardiac pacemaker     Past Surgical History  Procedure Date  . Insert / replace / remove pacemaker     St. Jude-PPM    ROS:  As stated in the HPI and negative for all other systems.  PHYSICAL EXAM BP 130/88  Pulse 72  Resp 18  Ht 5\' 5"  (1.651 m)  Wt 188 lb (85.276 kg)  BMI 31.28 kg/m2 GENERAL:  Well appearing HEENT:  Pupils equal round and reactive, fundi not visualized, oral mucosa unremarkable, dentures NECK:  No jugular venous distention, waveform within normal limits, carotid upstroke brisk and symmetric, no bruits, no thyromegaly LYMPHATICS:  No cervical, inguinal adenopathy LUNGS:  Clear to auscultation bilaterally BACK:  No CVA tenderness CHEST:  Right pacemaker pocket HEART:  PMI not displaced or sustained,S1 and S2 within normal limits, no S3, no S4, no clicks, no rubs, no  murmurs, irregular ABD:  Flat, positive bowel sounds normal in frequency in pitch, no bruits, no rebound, no guarding, no midline pulsatile mass, no hepatomegaly, no splenomegaly, obese EXT:  2 plus pulses throughout, no edema, no cyanosis no clubbing SKIN:  No rashes no nodules NEURO:  Cranial nerves II through XII grossly intact, motor grossly intact throughout PSYCH:  Cognitively intact, oriented to person place and time  EKG:  Atrial fibrillation, demand pacemaker  ASSESSMENT AND PLAN

## 2010-12-11 ENCOUNTER — Other Ambulatory Visit: Payer: Self-pay

## 2010-12-11 MED ORDER — DABIGATRAN ETEXILATE MESYLATE 150 MG PO CAPS: 150.0000 mg | ORAL_CAPSULE | Freq: Two times a day (BID) | ORAL | Status: DC

## 2011-01-19 ENCOUNTER — Other Ambulatory Visit: Payer: Self-pay

## 2011-01-19 MED ORDER — DABIGATRAN ETEXILATE MESYLATE 150 MG PO CAPS: 150.0000 mg | ORAL_CAPSULE | Freq: Two times a day (BID) | ORAL | Status: DC

## 2011-01-19 NOTE — Telephone Encounter (Signed)
.   Requested Prescriptions   Signed Prescriptions Disp Refills  . dabigatran (PRADAXA) 150 MG CAPS 60 capsule 11    Sig: Take 1 capsule (150 mg total) by mouth 2 (two) times daily.    Authorizing Provider: Rollene Rotunda    Ordering User: Lacie Scotts

## 2011-01-20 ENCOUNTER — Ambulatory Visit (INDEPENDENT_AMBULATORY_CARE_PROVIDER_SITE_OTHER): Payer: Medicare Other | Admitting: *Deleted

## 2011-01-20 DIAGNOSIS — I495 Sick sinus syndrome: Secondary | ICD-10-CM

## 2011-01-20 LAB — PACEMAKER DEVICE OBSERVATION
BAMS-0001: 180 {beats}/min
BAMS-0003: 60 {beats}/min
BATTERY VOLTAGE: 2.76 V
BRDY-0002RV: 60 {beats}/min
BRDY-0003RV: 105 {beats}/min
BRDY-0004RV: 115 {beats}/min
DEVICE MODEL PM: 927381
RV LEAD AMPLITUDE: 16.6 mv
RV LEAD THRESHOLD: 0.25 V

## 2011-01-20 NOTE — Progress Notes (Signed)
PPM check 

## 2011-02-25 ENCOUNTER — Encounter: Payer: Self-pay | Admitting: Internal Medicine

## 2011-04-22 ENCOUNTER — Encounter: Payer: Self-pay | Admitting: Internal Medicine

## 2011-04-22 DIAGNOSIS — I495 Sick sinus syndrome: Secondary | ICD-10-CM

## 2011-04-23 ENCOUNTER — Telehealth: Payer: Self-pay | Admitting: Internal Medicine

## 2011-04-23 NOTE — Telephone Encounter (Signed)
04-23-11 lmm @ 921a for pt to set up pacer ck with device to get it reprogrammed, next available/mt

## 2011-05-13 ENCOUNTER — Encounter: Payer: Self-pay | Admitting: Internal Medicine

## 2011-05-13 ENCOUNTER — Ambulatory Visit (INDEPENDENT_AMBULATORY_CARE_PROVIDER_SITE_OTHER): Payer: Medicare Other | Admitting: *Deleted

## 2011-05-13 DIAGNOSIS — Z950 Presence of cardiac pacemaker: Secondary | ICD-10-CM

## 2011-05-13 DIAGNOSIS — I495 Sick sinus syndrome: Secondary | ICD-10-CM

## 2011-05-13 DIAGNOSIS — I4891 Unspecified atrial fibrillation: Secondary | ICD-10-CM

## 2011-05-13 LAB — PACEMAKER DEVICE OBSERVATION
BAMS-0001: 180 {beats}/min
BAMS-0003: 60 {beats}/min
BATTERY VOLTAGE: 2.76 V
BRDY-0003RV: 105 {beats}/min
RV LEAD AMPLITUDE: 16.6 mv
RV LEAD THRESHOLD: 0.5 V

## 2011-05-13 NOTE — Progress Notes (Signed)
Pacer check in clinic  

## 2011-09-03 ENCOUNTER — Encounter: Payer: Self-pay | Admitting: Internal Medicine

## 2011-09-03 ENCOUNTER — Ambulatory Visit (INDEPENDENT_AMBULATORY_CARE_PROVIDER_SITE_OTHER): Payer: Medicare Other | Admitting: Internal Medicine

## 2011-09-03 VITALS — BP 119/80 | HR 72 | Resp 16 | Ht 64.0 in | Wt 198.0 lb

## 2011-09-03 DIAGNOSIS — E663 Overweight: Secondary | ICD-10-CM

## 2011-09-03 DIAGNOSIS — I4891 Unspecified atrial fibrillation: Secondary | ICD-10-CM

## 2011-09-03 DIAGNOSIS — I10 Essential (primary) hypertension: Secondary | ICD-10-CM

## 2011-09-03 DIAGNOSIS — I495 Sick sinus syndrome: Secondary | ICD-10-CM

## 2011-09-03 LAB — PACEMAKER DEVICE OBSERVATION
AL IMPEDENCE PM: 409 Ohm
BAMS-0001: 180 {beats}/min
BAMS-0003: 60 {beats}/min
BATTERY VOLTAGE: 2.76 V
RV LEAD AMPLITUDE: 16.6 mv
RV LEAD THRESHOLD: 0.5 V

## 2011-09-03 NOTE — Assessment & Plan Note (Signed)
Asymptomatic and rate controlled He is adequately anticoagulated with pradaxa I informed him that creatinine clearance should be checked at least once per year for dose adjustment of pradaxa.  He wishes to have this followed by Dr Lysbeth Galas.

## 2011-09-03 NOTE — Assessment & Plan Note (Signed)
Weight loss advised 

## 2011-09-03 NOTE — Assessment & Plan Note (Signed)
Stable No change required today  

## 2011-09-03 NOTE — Progress Notes (Signed)
PCP: Josue Hector, MD Primary Cardiologist:  Peter Howe is a 71 y.o. male who presents today for routine electrophysiology followup.  Since last being seen in our clinic, the patient reports doing very well.  Today, he denies symptoms of palpitations, chest pain, shortness of breath,  lower extremity edema, dizziness, presyncope, or syncope.  The patient is otherwise without complaint today.   Past Medical History  Diagnosis Date  . Tachycardia-bradycardia     s/p PPM  . Hypertension   . Permanent atrial fibrillation   . Overweight     obesity   Past Surgical History  Procedure Date  . Pacemaker insertion 03/28/03    St. Jude-PPM    Current Outpatient Prescriptions  Medication Sig Dispense Refill  . atenolol (TENORMIN) 100 MG tablet Take 100 mg by mouth daily.        . dabigatran (PRADAXA) 150 MG CAPS Take 1 capsule (150 mg total) by mouth 2 (two) times daily.  60 capsule  11  . Multiple Vitamin (MULTIVITAMIN) tablet Take 1 tablet by mouth daily.        . valsartan (DIOVAN) 80 MG tablet Take 80 mg by mouth daily.        Marland Kitchen zolpidem (AMBIEN) 10 MG tablet Take 10 mg by mouth at bedtime as needed.          Physical Exam: Filed Vitals:   09/03/11 0903  BP: 119/80  Pulse: 72  Resp: 16  Height: 5\' 4"  (1.626 m)  Weight: 198 lb (89.812 kg)    GEN- The patient is well appearing, alert and oriented x 3 today.   Head- normocephalic, atraumatic Eyes-  Sclera clear, conjunctiva pink Ears- hearing intact Oropharynx- clear Lungs- Clear to ausculation bilaterally, normal work of breathing Chest- pacemaker pocket is well healed Heart- Regular rate and rhythm, no murmurs, rubs or gallops, PMI not laterally displaced GI- soft, NT, ND, + BS Extremities- no clubbing, cyanosis, or edema  Pacemaker interrogation- reviewed in detail today,  See PACEART report  Assessment and Plan:

## 2011-09-03 NOTE — Assessment & Plan Note (Signed)
Normal pacemaker function See Peter Howe report Given permanent afib, will reprogram VVIR today to promote battery longevity.

## 2011-10-29 DIAGNOSIS — I495 Sick sinus syndrome: Secondary | ICD-10-CM

## 2012-01-25 ENCOUNTER — Other Ambulatory Visit: Payer: Self-pay | Admitting: Cardiology

## 2012-01-25 NOTE — Telephone Encounter (Signed)
..   Requested Prescriptions   Pending Prescriptions Disp Refills  . PRADAXA 150 MG CAPS [Pharmacy Med Name: PRADAXA 150 MG CAPSULE] 60 capsule 11    Sig: TAKE ONE TABLET TWICE DAILY

## 2012-04-01 DIAGNOSIS — I495 Sick sinus syndrome: Secondary | ICD-10-CM

## 2012-07-01 DIAGNOSIS — I495 Sick sinus syndrome: Secondary | ICD-10-CM

## 2012-08-11 ENCOUNTER — Encounter: Payer: Self-pay | Admitting: Internal Medicine

## 2012-08-15 ENCOUNTER — Emergency Department (HOSPITAL_COMMUNITY)
Admission: EM | Admit: 2012-08-15 | Discharge: 2012-08-15 | Disposition: A | Discharge status: Home / Self Care | Payer: Medicare Other | Attending: Emergency Medicine | Admitting: Emergency Medicine

## 2012-08-15 ENCOUNTER — Emergency Department (HOSPITAL_COMMUNITY): Payer: Medicare Other

## 2012-08-15 ENCOUNTER — Encounter (HOSPITAL_COMMUNITY): Payer: Self-pay

## 2012-08-15 DIAGNOSIS — Z87891 Personal history of nicotine dependence: Secondary | ICD-10-CM | POA: Insufficient documentation

## 2012-08-15 DIAGNOSIS — T07XXXA Unspecified multiple injuries, initial encounter: Secondary | ICD-10-CM

## 2012-08-15 DIAGNOSIS — W19XXXA Unspecified fall, initial encounter: Secondary | ICD-10-CM

## 2012-08-15 DIAGNOSIS — IMO0002 Reserved for concepts with insufficient information to code with codable children: Secondary | ICD-10-CM | POA: Insufficient documentation

## 2012-08-15 DIAGNOSIS — E669 Obesity, unspecified: Secondary | ICD-10-CM | POA: Insufficient documentation

## 2012-08-15 DIAGNOSIS — W1809XA Striking against other object with subsequent fall, initial encounter: Secondary | ICD-10-CM | POA: Insufficient documentation

## 2012-08-15 DIAGNOSIS — I10 Essential (primary) hypertension: Secondary | ICD-10-CM | POA: Insufficient documentation

## 2012-08-15 DIAGNOSIS — Z79899 Other long term (current) drug therapy: Secondary | ICD-10-CM | POA: Insufficient documentation

## 2012-08-15 DIAGNOSIS — Z8679 Personal history of other diseases of the circulatory system: Secondary | ICD-10-CM | POA: Insufficient documentation

## 2012-08-15 DIAGNOSIS — Z880 Allergy status to penicillin: Secondary | ICD-10-CM | POA: Insufficient documentation

## 2012-08-15 DIAGNOSIS — Y9389 Activity, other specified: Secondary | ICD-10-CM | POA: Insufficient documentation

## 2012-08-15 DIAGNOSIS — Y9289 Other specified places as the place of occurrence of the external cause: Secondary | ICD-10-CM | POA: Insufficient documentation

## 2012-08-15 LAB — CBC WITH DIFFERENTIAL/PLATELET
Basophils Absolute: 0 10*3/uL (ref 0.0–0.1)
Basophils Relative: 0 % (ref 0–1)
Eosinophils Relative: 2 % (ref 0–5)
Lymphocytes Relative: 21 % (ref 12–46)
MCHC: 34.2 g/dL (ref 30.0–36.0)
MCV: 98.4 fL (ref 78.0–100.0)
Neutro Abs: 5.1 10*3/uL (ref 1.7–7.7)
Platelets: 157 10*3/uL (ref 150–400)
RDW: 13.3 % (ref 11.5–15.5)
WBC: 7.7 10*3/uL (ref 4.0–10.5)

## 2012-08-15 LAB — URINALYSIS, ROUTINE W REFLEX MICROSCOPIC
Bilirubin Urine: NEGATIVE
Ketones, ur: NEGATIVE mg/dL
Nitrite: NEGATIVE
Protein, ur: NEGATIVE mg/dL
Specific Gravity, Urine: <1.005 — ABNORMAL LOW (ref 1.005–1.030)
Urobilinogen, UA: 0.2 mg/dL (ref 0.0–1.0)

## 2012-08-15 LAB — BASIC METABOLIC PANEL
BUN: 14 mg/dL (ref 6–23)
Calcium: 10 mg/dL (ref 8.4–10.5)
GFR calc Af Amer: 72 mL/min — ABNORMAL LOW (ref >90)
GFR calc non Af Amer: 62 mL/min — ABNORMAL LOW (ref >90)
Glucose, Bld: 129 mg/dL — ABNORMAL HIGH (ref 70–99)
Potassium: 4.1 mEq/L (ref 3.5–5.1)

## 2012-08-15 LAB — PROTIME-INR: Prothrombin Time: 19.2 seconds — ABNORMAL HIGH (ref 11.6–15.2)

## 2012-08-15 MED ORDER — LORAZEPAM 2 MG/ML IJ SOLN
1.0000 mg | Freq: Once | INTRAMUSCULAR | Status: AC
  Administered 2012-08-15: 1 mg via INTRAVENOUS
  Filled 2012-08-15: qty 1

## 2012-08-15 MED ORDER — HYDROCODONE-ACETAMINOPHEN 5-325 MG PO TABS: 2.0000 | ORAL_TABLET | ORAL | Status: DC | PRN

## 2012-08-15 MED ORDER — IOHEXOL 300 MG/ML  SOLN: 100.0000 mL | Freq: Once | INTRAMUSCULAR | Status: DC | PRN
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
## 2012-08-15 NOTE — ED Notes (Signed)
Pt unable to complete ct scan, will be given ativan and attempt the scans later.

## 2012-08-15 NOTE — ED Notes (Signed)
Notified pt of need for urine, verbalized understanding.  Family at bedside.
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
## 2012-08-31 ENCOUNTER — Encounter: Payer: Self-pay | Admitting: *Deleted

## 2012-08-31 ENCOUNTER — Encounter: Payer: Self-pay | Admitting: Internal Medicine

## 2012-08-31 ENCOUNTER — Ambulatory Visit (INDEPENDENT_AMBULATORY_CARE_PROVIDER_SITE_OTHER): Payer: Medicare Other | Admitting: Internal Medicine

## 2012-08-31 ENCOUNTER — Telehealth: Payer: Self-pay | Admitting: Internal Medicine

## 2012-08-31 ENCOUNTER — Other Ambulatory Visit: Payer: Self-pay | Admitting: *Deleted

## 2012-08-31 VITALS — BP 122/82 | HR 77 | Ht 64.0 in | Wt 202.0 lb

## 2012-08-31 DIAGNOSIS — E663 Overweight: Secondary | ICD-10-CM

## 2012-08-31 DIAGNOSIS — I4891 Unspecified atrial fibrillation: Secondary | ICD-10-CM

## 2012-08-31 DIAGNOSIS — R0602 Shortness of breath: Secondary | ICD-10-CM | POA: Insufficient documentation

## 2012-08-31 DIAGNOSIS — I495 Sick sinus syndrome: Secondary | ICD-10-CM

## 2012-08-31 DIAGNOSIS — Z950 Presence of cardiac pacemaker: Secondary | ICD-10-CM

## 2012-08-31 LAB — PACEMAKER DEVICE OBSERVATION
AL AMPLITUDE: 2.5 mv
AL IMPEDENCE PM: 418 Ohm
BATTERY VOLTAGE: 2.76 V
BRDY-0002RV: 60 {beats}/min
RV LEAD AMPLITUDE: 16.6 mv
VENTRICULAR PACING PM: 27

## 2012-08-31 NOTE — Telephone Encounter (Signed)
No precert required 

## 2012-08-31 NOTE — Progress Notes (Signed)
PCP: Josue Hector, MD  Peter Howe is a 72 y.o. male who presents today for routine electrophysiology followup.  Since last being seen in our clinic, the patient reports doing very well.  He remains active and continues to play golf.  He does however notice that he is more SOB with ambulation.  He reports SOB with 1 flight of stairs.  Today, he denies symptoms of palpitations, chest pain,  lower extremity edema, dizziness, presyncope, or syncope.  The patient is otherwise without complaint today.   Past Medical History  Diagnosis Date  . Tachycardia-bradycardia     s/p PPM  . Hypertension   . Permanent atrial fibrillation   . Overweight(278.02)     obesity   Past Surgical History  Procedure Laterality Date  . Pacemaker insertion  03/28/03    St. Jude-PPM    Current Outpatient Prescriptions  Medication Sig Dispense Refill  . atenolol (TENORMIN) 100 MG tablet Take 100 mg by mouth daily.        . dabigatran (PRADAXA) 150 MG CAPS Take 150 mg by mouth every 12 (twelve) hours.      . fluticasone (FLONASE) 50 MCG/ACT nasal spray Place 2 sprays into the nose as needed.       Marland Kitchen HYDROcodone-acetaminophen (NORCO/VICODIN) 5-325 MG per tablet Take 2 tablets by mouth every 4 (four) hours as needed for pain.  10 tablet  0  . losartan (COZAAR) 50 MG tablet Take 50 mg by mouth daily.      Marland Kitchen zolpidem (AMBIEN) 10 MG tablet Take 5 mg by mouth at bedtime as needed for sleep.        No current facility-administered medications for this visit.    Physical Exam: Filed Vitals:   08/31/12 0846  BP: 122/82  Pulse: 77  Height: 5\' 4"  (1.626 m)  Weight: 202 lb (91.627 kg)    GEN- The patient is well appearing, alert and oriented x 3 today.   Head- normocephalic, atraumatic Eyes-  Sclera clear, conjunctiva pink Ears- hearing intact Oropharynx- clear Lungs- Clear to ausculation bilaterally, normal work of breathing Chest- pacemaker pocket is well healed Heart- irregular rate and rhythm,  no murmurs, rubs or gallops, PMI not laterally displaced GI- soft, NT, ND, + BS Extremities- no clubbing, cyanosis, or edema  Pacemaker interrogation- reviewed in detail today,  See PACEART report  Assessment and Plan:  1. SOB I am concerned about worsening SOB.  Exam today is benign.  I will order an echo and stress test to better evaluate this. If low risk, then continue medical therapy long term.  2. afib Permanent afib with undersensing on his PPM. Will switch to VVIR today. Continue pradaxa and follow-up of CrCl by PCP.  3. Symptomatic bradycardia Normal pacemaker function See Pace Art report Changed to VVIR today  Return in 1 year if echo/ stress test are low risk.

## 2012-08-31 NOTE — Telephone Encounter (Signed)
°  lexiscan myoview scheduled for 09-12-12 @ [facility] Checking percert

## 2012-08-31 NOTE — Patient Instructions (Addendum)
Your physician recommends that you schedule a follow-up appointment in: 1 year. You will receive a reminder letter in the mail in about 10months reminding you to call and schedule your appointment. If you don't receive this letter, please contact our office. Your physician recommends that you continue on your current medications as directed. Please refer to the Current Medication list given to you today. Your next home device check is in 3 months. Your physician has requested that you have an echocardiogram. Echocardiography is a painless test that uses sound waves to create images of your heart. It provides your doctor with information about the size and shape of your heart and how well your heart's chambers and valves are working. This procedure takes approximately one hour. There are no restrictions for this procedure. Your physician has requested that you have a lexiscan myoview. For further information please visit [email or web address] Please follow instruction sheet, as given.

## 2012-09-08 ENCOUNTER — Other Ambulatory Visit: Payer: Self-pay

## 2012-09-08 ENCOUNTER — Other Ambulatory Visit (INDEPENDENT_AMBULATORY_CARE_PROVIDER_SITE_OTHER): Payer: Medicare Other

## 2012-09-08 ENCOUNTER — Encounter: Payer: Self-pay | Admitting: Internal Medicine

## 2012-09-08 DIAGNOSIS — I4891 Unspecified atrial fibrillation: Secondary | ICD-10-CM

## 2012-09-08 DIAGNOSIS — I495 Sick sinus syndrome: Secondary | ICD-10-CM

## 2012-09-08 DIAGNOSIS — R0602 Shortness of breath: Secondary | ICD-10-CM

## 2012-09-12 DIAGNOSIS — I4891 Unspecified atrial fibrillation: Secondary | ICD-10-CM

## 2012-09-26 ENCOUNTER — Telehealth: Payer: Self-pay | Admitting: Internal Medicine

## 2012-09-26 ENCOUNTER — Encounter: Payer: Self-pay | Admitting: Internal Medicine

## 2012-09-26 NOTE — Telephone Encounter (Signed)
Patient would like to know his test results.  °

## 2012-09-29 ENCOUNTER — Encounter: Payer: Self-pay | Admitting: *Deleted

## 2013-01-02 DIAGNOSIS — I495 Sick sinus syndrome: Secondary | ICD-10-CM

## 2013-03-07 ENCOUNTER — Other Ambulatory Visit: Payer: Self-pay | Admitting: Cardiology

## 2013-05-05 ENCOUNTER — Encounter: Payer: Self-pay | Admitting: Internal Medicine

## 2013-05-05 DIAGNOSIS — I495 Sick sinus syndrome: Secondary | ICD-10-CM

## 2013-08-28 ENCOUNTER — Ambulatory Visit (INDEPENDENT_AMBULATORY_CARE_PROVIDER_SITE_OTHER): Payer: Medicare Other | Admitting: Internal Medicine

## 2013-08-28 ENCOUNTER — Encounter: Payer: Self-pay | Admitting: Internal Medicine

## 2013-08-28 VITALS — BP 155/72 | HR 89 | Ht 64.0 in | Wt 199.0 lb

## 2013-08-28 DIAGNOSIS — E663 Overweight: Secondary | ICD-10-CM

## 2013-08-28 DIAGNOSIS — I4891 Unspecified atrial fibrillation: Secondary | ICD-10-CM

## 2013-08-28 DIAGNOSIS — I480 Paroxysmal atrial fibrillation: Secondary | ICD-10-CM

## 2013-08-28 DIAGNOSIS — I495 Sick sinus syndrome: Secondary | ICD-10-CM

## 2013-08-28 DIAGNOSIS — Z950 Presence of cardiac pacemaker: Secondary | ICD-10-CM

## 2013-08-28 DIAGNOSIS — I10 Essential (primary) hypertension: Secondary | ICD-10-CM

## 2013-08-28 DIAGNOSIS — R0602 Shortness of breath: Secondary | ICD-10-CM

## 2013-08-28 LAB — MDC_IDC_ENUM_SESS_TYPE_INCLINIC
Battery Voltage: 2.76 V
Implantable Pulse Generator Model: 5376
Lead Channel Pacing Threshold Amplitude: 0.5 V
Lead Channel Pacing Threshold Pulse Width: 0.4 ms
Lead Channel Sensing Intrinsic Amplitude: 16.6 mV
Lead Channel Setting Pacing Pulse Width: 0.4 ms
MDC IDC MSMT BATTERY IMPEDANCE: 2900 Ohm
MDC IDC MSMT LEADCHNL RV IMPEDANCE VALUE: 887 Ohm
MDC IDC PG SERIAL: 927381
MDC IDC SESS DTM: 20150622094556
MDC IDC SET LEADCHNL RV PACING AMPLITUDE: 2.5 V
MDC IDC SET LEADCHNL RV SENSING SENSITIVITY: 2 mV
MDC IDC STAT BRADY RV PERCENT PACED: 25 %

## 2013-08-28 NOTE — Patient Instructions (Signed)
   [employer] checks every 3 months at home. Your physician recommends that you schedule a follow-up appointment in: 1 year. You will receive a reminder letter in the mail in about 10 months reminding you to call and schedule your appointment. If you don't receive this letter, please contact our office. Your physician recommends that you continue on your current medications as directed. Please refer to the Current Medication list given to you today.

## 2013-08-28 NOTE — Progress Notes (Signed)
PCP: Sherrie Mustache, MD  Peter Howe is a 73 y.o. male who presents today for routine electrophysiology followup.  Since last being seen in our clinic, the patient reports doing very well.  He remains active and continues to play golf.  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  He thinks that his SOB is better.  Today, he denies symptoms of palpitations, chest pain,  lower extremity edema, dizziness, presyncope, or syncope.  The patient is otherwise without complaint today.   Past Medical History  Diagnosis Date  . Tachycardia-bradycardia     s/p PPM  . Hypertension   . Permanent atrial fibrillation   . Overweight(278.02)     obesity   Past Surgical History  Procedure Laterality Date  . Pacemaker insertion  03/28/03    St. Jude-PPM    Current Outpatient Prescriptions  Medication Sig Dispense Refill  . atenolol (TENORMIN) 100 MG tablet Take 100 mg by mouth daily.        Marland Kitchen losartan (COZAAR) 50 MG tablet Take 50 mg by mouth daily.      Marland Kitchen PRADAXA 150 MG CAPS capsule TAKE ONE TABLET BY MOUTH TWICE DAILY  60 capsule  6  . zolpidem (AMBIEN) 10 MG tablet Take 5 mg by mouth at bedtime as needed for sleep.        No current facility-administered medications for this visit.    Physical Exam: Filed Vitals:   08/28/13 0918  BP: 155/72  Pulse: 89  Height: 5\' 4"  (1.626 m)  Weight: 199 lb (90.266 kg)    GEN- The patient is well appearing, alert and oriented x 3 today.   Head- normocephalic, atraumatic Eyes-  Sclera clear, conjunctiva pink Ears- hearing intact Oropharynx- clear Lungs- Clear to ausculation bilaterally, normal work of breathing Chest- pacemaker pocket is well healed Heart- irregular rate and rhythm, no murmurs, rubs or gallops, PMI not laterally displaced GI- soft, NT, ND, + BS Extremities- no clubbing, cyanosis, or edema  Pacemaker interrogation- reviewed in detail today,  See PACEART report Myoview/ echo from 2014 was  reviewed with the patient today  Assessment and Plan:  1. SOB Echo and myoview are reviewed Lifestyle modification including weight reduction are encouraged  2. afib Permanent afib  Continue pradaxa and follow-up of CrCl by Dr Edrick Oh.  3. Symptomatic bradycardia Normal pacemaker function See Pace Art report No changes today  4. htn Above goal Salt restriction and weight reduction are encouraged  Return in 1 year  TTMs every 3 months

## 2013-09-12 ENCOUNTER — Other Ambulatory Visit: Payer: Self-pay | Admitting: Cardiology

## 2013-09-18 DIAGNOSIS — I10 Essential (primary) hypertension: Secondary | ICD-10-CM | POA: Insufficient documentation

## 2013-09-26 ENCOUNTER — Encounter: Payer: Self-pay | Admitting: Internal Medicine

## 2013-11-21 ENCOUNTER — Encounter: Payer: Self-pay | Admitting: Internal Medicine

## 2013-11-21 DIAGNOSIS — I495 Sick sinus syndrome: Secondary | ICD-10-CM

## 2014-02-20 ENCOUNTER — Encounter: Payer: Self-pay | Admitting: Internal Medicine

## 2014-02-20 DIAGNOSIS — R001 Bradycardia, unspecified: Secondary | ICD-10-CM

## 2014-05-22 ENCOUNTER — Encounter: Payer: Self-pay | Admitting: Internal Medicine

## 2014-05-29 ENCOUNTER — Telehealth: Payer: Self-pay | Admitting: *Deleted

## 2014-05-29 NOTE — Telephone Encounter (Signed)
Reviewed pt's chart.  CHADS score of 1.  Last Cr I have is from 2014 and at that time, CrCl was 72 mL/min.  Okay to hold Pradaxa x 3 days prior to colonoscopy and restart as soon as possible afterwards.

## 2014-05-29 NOTE — Telephone Encounter (Signed)
Will forward this note to [facility]

## 2014-05-29 NOTE — Telephone Encounter (Signed)
Patient is needing clearance to stop pradaxa for 3 days prior to colonoscopy and hx of colon polyps.  Will forward to sally earl pharm md for clearance.

## 2014-06-11 ENCOUNTER — Telehealth: Payer: Self-pay | Admitting: Cardiology

## 2014-06-11 NOTE — Telephone Encounter (Signed)
INFORMED  Peter Howe, [place] FOR COLONSCOPY SHE WILL INFORM [place]

## 2014-06-11 NOTE — Telephone Encounter (Signed)
Still waiting for clarence to stop Pradaxa.Please fax [phone] [email or web address]:[email or web address].

## 2014-07-18 ENCOUNTER — Other Ambulatory Visit: Payer: Self-pay | Admitting: Cardiology

## 2014-07-19 ENCOUNTER — Other Ambulatory Visit: Payer: Self-pay | Admitting: *Deleted

## 2014-07-19 MED ORDER — DABIGATRAN ETEXILATE MESYLATE 150 MG PO CAPS
150.0000 mg | ORAL_CAPSULE | Freq: Two times a day (BID) | ORAL | Status: DC
Start: 2014-07-19 — End: 2015-07-22

## 2014-08-17 ENCOUNTER — Encounter: Payer: Self-pay | Admitting: Internal Medicine

## 2014-08-17 ENCOUNTER — Ambulatory Visit (INDEPENDENT_AMBULATORY_CARE_PROVIDER_SITE_OTHER): Payer: Self-pay | Admitting: Internal Medicine

## 2014-08-17 VITALS — BP 122/96 | HR 72 | Ht 64.0 in | Wt 197.0 lb

## 2014-08-17 DIAGNOSIS — I495 Sick sinus syndrome: Secondary | ICD-10-CM

## 2014-08-17 DIAGNOSIS — I10 Essential (primary) hypertension: Secondary | ICD-10-CM

## 2014-08-17 DIAGNOSIS — I482 Chronic atrial fibrillation, unspecified: Secondary | ICD-10-CM

## 2014-08-17 LAB — CUP PACEART INCLINIC DEVICE CHECK
Lead Channel Pacing Threshold Amplitude: 0.5 V
Lead Channel Setting Pacing Amplitude: 2.5 V
Lead Channel Setting Sensing Sensitivity: 2 mV
MDC IDC MSMT BATTERY IMPEDANCE: 3400 Ohm
MDC IDC MSMT BATTERY VOLTAGE: 2.76 V
MDC IDC MSMT LEADCHNL RV IMPEDANCE VALUE: 799 Ohm
MDC IDC MSMT LEADCHNL RV PACING THRESHOLD PULSEWIDTH: 0.4 ms
MDC IDC MSMT LEADCHNL RV SENSING INTR AMPL: 16.6 mV
MDC IDC PG SERIAL: 927381
MDC IDC SESS DTM: 20160610120302
MDC IDC SET LEADCHNL RV PACING PULSEWIDTH: 0.4 ms

## 2014-08-17 NOTE — Progress Notes (Signed)
PCP: Sherrie Mustache, MD  Peter Howe is a 74 y.o. male who presents today for routine electrophysiology followup.  Since last being seen in our clinic, the patient reports doing very well.  He remains active and continues to play golf.  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  He is without complaint today.  SOB with moderate exertion.  Today, he denies symptoms of palpitations, chest pain,  lower extremity edema, dizziness, presyncope, or syncope.  The patient is otherwise without complaint today.   Past Medical History  Diagnosis Date  . Tachycardia-bradycardia     s/p PPM  . Hypertension   . Permanent atrial fibrillation   . Overweight(278.02)     obesity   Past Surgical History  Procedure Laterality Date  . Pacemaker insertion  03/28/03    St. Jude-PPM    Current Outpatient Prescriptions  Medication Sig Dispense Refill  . atenolol (TENORMIN) 100 MG tablet Take 100 mg by mouth daily.      . dabigatran (PRADAXA) 150 MG CAPS capsule Take 1 capsule (150 mg total) by mouth 2 (two) times daily. 120 capsule 3  . losartan (COZAAR) 50 MG tablet Take 50 mg by mouth daily.    Marland Kitchen zolpidem (AMBIEN) 10 MG tablet Take 5 mg by mouth at bedtime as needed for sleep.      No current facility-administered medications for this visit.    Physical Exam: Filed Vitals:   08/17/14 1148  BP: 122/96  Pulse: 72  Height: 5\' 4"  (1.626 m)  Weight: 89.359 kg (197 lb)    GEN- The patient is well appearing, alert and oriented x 3 today.   Head- normocephalic, atraumatic Eyes-  Sclera clear, conjunctiva pink Ears- hearing intact Oropharynx- clear Lungs- Clear to ausculation bilaterally, normal work of breathing Chest- pacemaker pocket is well healed Heart- irregular rate and rhythm, no murmurs, rubs or gallops, PMI not laterally displaced GI- soft, NT, ND, + BS Extremities- no clubbing, cyanosis, or edema  Pacemaker interrogation- reviewed in detail  today,  See PACEART report  Assessment and Plan:  1.   afib Permanent afib  Continue pradaxa and follow-up of CrCl by Dr Edrick Oh.  2. Symptomatic bradycardia Normal pacemaker function See Pace Art report No changes today  3. htn Stable No change required today Salt restriction and weight reduction are encouraged  Return in 1 year

## 2014-08-17 NOTE — Patient Instructions (Addendum)
Your physician recommends that you continue on your current medications as directed. Please refer to the Current Medication list given to you today. Your physician recommends that you schedule a follow-up appointment in: 12 months with Dr. Rayann Heman. You will receive a reminder letter in the mail in about 10 months reminding you to call and schedule your appointment. If you don't receive this letter, please contact our office.

## 2014-09-11 ENCOUNTER — Encounter: Payer: Self-pay | Admitting: Internal Medicine

## 2015-02-06 DIAGNOSIS — I495 Sick sinus syndrome: Secondary | ICD-10-CM | POA: Diagnosis not present

## 2015-03-22 ENCOUNTER — Encounter: Payer: Self-pay | Admitting: *Deleted

## 2015-03-26 ENCOUNTER — Ambulatory Visit (INDEPENDENT_AMBULATORY_CARE_PROVIDER_SITE_OTHER): Payer: Medicare HMO | Admitting: Cardiovascular Disease

## 2015-03-26 ENCOUNTER — Encounter: Payer: Self-pay | Admitting: Cardiovascular Disease

## 2015-03-26 ENCOUNTER — Encounter: Payer: Self-pay | Admitting: *Deleted

## 2015-03-26 VITALS — BP 119/81 | HR 69 | Ht 64.0 in | Wt 195.0 lb

## 2015-03-26 DIAGNOSIS — I495 Sick sinus syndrome: Secondary | ICD-10-CM

## 2015-03-26 DIAGNOSIS — I4821 Permanent atrial fibrillation: Secondary | ICD-10-CM

## 2015-03-26 DIAGNOSIS — I482 Chronic atrial fibrillation: Secondary | ICD-10-CM

## 2015-03-26 DIAGNOSIS — I480 Paroxysmal atrial fibrillation: Secondary | ICD-10-CM | POA: Diagnosis not present

## 2015-03-26 DIAGNOSIS — R079 Chest pain, unspecified: Secondary | ICD-10-CM | POA: Diagnosis not present

## 2015-03-26 DIAGNOSIS — I10 Essential (primary) hypertension: Secondary | ICD-10-CM

## 2015-03-26 DIAGNOSIS — Z950 Presence of cardiac pacemaker: Secondary | ICD-10-CM

## 2015-03-26 MED ORDER — NITROGLYCERIN 0.4 MG SL SUBL: 0.4000 mg | SUBLINGUAL_TABLET | SUBLINGUAL | Status: DC | PRN

## 2015-03-26 NOTE — Progress Notes (Signed)
Patient ID: Peter Howe, male   DOB: [birth year], 75 y.o.   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 He was evaluated by Dr. Rayann Heman most recently in June 2016.  ECG performed in the office today demonstrates atrial fibrillation, heart rate 77 bpm, nonspecific T wave abnormalities, with occasional paced ventricular beats.  Normal pacemaker function was demonstrated by interrogation in June 2016.  For the past 6 months, he develops indigestion-like symptoms after walking on the treadmill every morning. Symptoms last for approximately 5 minutes and are relieved with belching. He was prescribed a medication by his PCP but did not experience relief. He underwent a normal nuclear stress test which did not demonstrate any evidence of myocardial ischemia or scar on 09/12/12, LVEF 59%. He walks and plays golf and does not experience the symptoms, but admits that he does not do so as vigorously as he does on the treadmill.  Review of Systems: As per "subjective", otherwise negative.  Allergies  Allergen Reactions  . Penicillins Rash  . Sulfonamide Derivatives Rash    Current Outpatient Prescriptions  Medication Sig Dispense Refill  . atenolol (TENORMIN) 100 MG tablet Take 100 mg by mouth daily.      . dabigatran (PRADAXA) 150 MG CAPS capsule Take 1 capsule (150 mg total) by mouth 2 (two) times daily. 120 capsule 3  . losartan (COZAAR) 50 MG tablet Take 50 mg by mouth daily.    Marland Kitchen zolpidem (AMBIEN) 10 MG tablet Take 5 mg by mouth at bedtime as needed for sleep.      No current facility-administered medications for this visit.    Past Medical History  Diagnosis Date  . Tachycardia-bradycardia ([place])     s/p PPM  . Hypertension   . Permanent atrial fibrillation ([place])   . Overweight(278.02)     obesity    Past Surgical History  Procedure Laterality  Date  . Pacemaker insertion  03/28/03    St. Jude-PPM    Social History   Social History  . Marital Status: Widowed    Spouse Name: N/A  . Number of Children: N/A  . Years of Education: N/A   Occupational History  .      FULL TIME   Social History Main Topics  . Smoking status: Former Smoker -- 1.00 packs/day for 30 years    Types: Cigarettes    Start date: 03/30/1955    Quit date: 03/09/1993  . Smokeless tobacco: Never Used  . Alcohol Use: 0.0 oz/week    0 Standard drinks or equivalent per week  . Drug Use: No  . Sexual Activity: Not on file   Other Topics Concern  . Not on file   Social History Narrative   Owns a Engineer, technical sales, enjoys golf   Lives in [place]:   03/26/15 0836  BP: 119/81  Pulse: 69  Height: 5\' 4"  (1.626 m)  Weight: 195 lb (88.451 kg)    PHYSICAL EXAM General: NAD HEENT: Normal. Neck: No JVD, no thyromegaly. Lungs: Clear to auscultation bilaterally with normal respiratory effort. CV: Irregular rhythm, normal rate, normal S1/S2, no S3, no murmur. No pretibial or periankle edema. No carotid bruits. Abdomen: Soft, nontender, obese, no distention.  Neurologic: Alert and oriented x 3.  Psych: Normal affect. Skin: Normal. Musculoskeletal: Normal range of motion, no gross deformities. Extremities: No clubbing or cyanosis.  ECG: Most recent ECG reviewed.      ASSESSMENT AND PLAN: 1. Permanent atrial fibrillation: HR controlled on atenolol. Continue dabigatran for anticoagulation.  2. Tachy-brady syndrome s/p PPM: Normal pacemaker function in 08/2014. Follows with Dr. Rayann Heman.  3. Essential HTN: Controlled. No changes.  4. "Indigestion" with exertion/chest discomfort: Symptoms suspicious for ischemic heart disease. Will prescribe SL nitroglycerin. Will also obtain Lexiscan Cardiolite stress test for further clarification.  Dispo: fu 1 month   Kate Sable, M.D., F.A.C.C.

## 2015-03-26 NOTE — Patient Instructions (Signed)
Your physician recommends that you schedule a follow-up appointment in: [place]  Your physician has recommended you make the following change in your medication:   START NITROGLYCERIN AS NEEDED FOR CHEST PAIN   Your physician has requested that you have a lexiscan myoview. For further information please visit [email or web address]. Please follow instruction sheet, as given.  Thank you for choosing [facility]!!  Nitroglycerin sublingual tablets What is this medicine? NITROGLYCERIN (nye troe GLI ser in) is a type of vasodilator. It relaxes blood vessels, increasing the blood and oxygen supply to your heart. This medicine is used to relieve chest pain caused by angina. It is also used to prevent chest pain before activities like climbing stairs, going outdoors in cold weather, or sexual activity. This medicine may be used for other purposes; ask your health care provider or pharmacist if you have questions. What should I tell my health care provider before I take this medicine? They need to know if you have any of these conditions: -anemia -head injury, recent stroke, or bleeding in the brain -liver disease -previous heart attack -an unusual or allergic reaction to nitroglycerin, other medicines, foods, dyes, or preservatives -pregnant or trying to get pregnant -breast-feeding How should I use this medicine? Take this medicine by mouth as needed. At the first sign of an angina attack (chest pain or tightness) place one tablet under your tongue. You can also take this medicine 5 to 10 minutes before an event likely to produce chest pain. Follow the directions on the prescription label. Let the tablet dissolve under the tongue. Do not swallow whole. Replace the dose if you accidentally swallow it. It will help if your mouth is not dry. Saliva around the tablet will help it to dissolve more quickly. Do not eat or drink, smoke or chew tobacco while a tablet is  dissolving. If you are not better within 5 minutes after taking ONE dose of nitroglycerin, call 9-1-1 immediately to seek emergency medical care. Do not take more than 3 nitroglycerin tablets over 15 minutes. If you take this medicine often to relieve symptoms of angina, your doctor or health care professional may provide you with different instructions to manage your symptoms. If symptoms do not go away after following these instructions, it is important to call 9-1-1 immediately. Do not take more than 3 nitroglycerin tablets over 15 minutes. Talk to your pediatrician regarding the use of this medicine in children. Special care may be needed. Overdosage: If you think you have taken too much of this medicine contact a poison control center or emergency room at once. NOTE: This medicine is only for you. Do not share this medicine with others. What if I miss a dose? This does not apply. This medicine is only used as needed. What may interact with this medicine? Do not take this medicine with any of the following medications: -certain migraine medicines like ergotamine and dihydroergotamine (DHE) -medicines used to treat erectile dysfunction like sildenafil, tadalafil, and vardenafil -riociguat This medicine may also interact with the following medications: -alteplase -aspirin -heparin -medicines for high blood pressure -medicines for mental depression -other medicines used to treat angina -phenothiazines like chlorpromazine, mesoridazine, prochlorperazine, thioridazine This list may not describe all possible interactions. Give your health care provider a list of all the medicines, herbs, non-prescription drugs, or dietary supplements you use. Also tell them if you smoke, drink alcohol, or use illegal drugs. Some items may interact with your medicine. What should I watch for  while using this medicine? Tell your doctor or health care professional if you feel your medicine is no longer  working. Keep this medicine with you at all times. Sit or lie down when you take your medicine to prevent falling if you feel dizzy or faint after using it. Try to remain calm. This will help you to feel better faster. If you feel dizzy, take several deep breaths and lie down with your feet propped up, or bend forward with your head resting between your knees. You may get drowsy or dizzy. Do not drive, use machinery, or do anything that needs mental alertness until you know how this drug affects you. Do not stand or sit up quickly, especially if you are an older patient. This reduces the risk of dizzy or fainting spells. Alcohol can make you more drowsy and dizzy. Avoid alcoholic drinks. Do not treat yourself for coughs, colds, or pain while you are taking this medicine without asking your doctor or health care professional for advice. Some ingredients may increase your blood pressure. What side effects may I notice from receiving this medicine? Side effects that you should report to your doctor or health care professional as soon as possible: -blurred vision -dry mouth -skin rash -sweating -the feeling of extreme pressure in the head -unusually weak or tired Side effects that usually do not require medical attention (report to your doctor or health care professional if they continue or are bothersome): -flushing of the face or neck -headache -irregular heartbeat, palpitations -nausea, vomiting This list may not describe all possible side effects. Call your doctor for medical advice about side effects. You may report side effects to FDA at 1-800-FDA-1088. Where should I keep my medicine? Keep out of the reach of children. Store at room temperature between 20 and 25 degrees C (68 and 77 degrees F). Store in [facility] [employer]. Protect from light and moisture. Keep tightly closed. Throw away any unused medicine after the expiration date. NOTE: This sheet is a summary. It may not cover all possible  information. If you have questions about this medicine, talk to your doctor, pharmacist, or health care provider.    2016, Elsevier/Gold Standard. (2012-12-22 17:57:36)

## 2015-04-02 ENCOUNTER — Inpatient Hospital Stay (HOSPITAL_COMMUNITY): Admission: RE | Admit: 2015-04-02 | Payer: Medicare HMO | Source: Ambulatory Visit

## 2015-04-02 ENCOUNTER — Encounter (HOSPITAL_COMMUNITY)
Admission: RE | Admit: 2015-04-02 | Discharge: 2015-04-02 | Disposition: A | Discharge status: Home / Self Care | Payer: Medicare HMO | Source: Ambulatory Visit | Attending: Cardiovascular Disease | Admitting: Cardiovascular Disease

## 2015-04-02 ENCOUNTER — Encounter (HOSPITAL_COMMUNITY): Payer: Self-pay

## 2015-04-02 DIAGNOSIS — R079 Chest pain, unspecified: Secondary | ICD-10-CM | POA: Insufficient documentation

## 2015-04-02 LAB — NM MYOCAR MULTI W/SPECT W/WALL MOTION / EF
CHL CUP NUCLEAR SSS: 3
CSEPPHR: 108 {beats}/min
LHR: 0.35
LVDIAVOL: 59 mL
LVSYSVOL: 21 mL
NUC STRESS TID: 0.93
Rest HR: 75 {beats}/min
SDS: 1
SRS: 2

## 2015-04-02 MED ORDER — TECHNETIUM TC 99M SESTAMIBI GENERIC - CARDIOLITE
30.0000 | Freq: Once | INTRAVENOUS | Status: AC | PRN
  Administered 2015-04-02: 31.2 via INTRAVENOUS

## 2015-04-02 MED ORDER — SODIUM CHLORIDE 0.9 % IJ SOLN
INTRAMUSCULAR | Status: AC
  Administered 2015-04-02: 10 mL via INTRAVENOUS
  Filled 2015-04-02: qty 3

## 2015-04-02 MED ORDER — REGADENOSON 0.4 MG/5ML IV SOLN
INTRAVENOUS | Status: AC
  Administered 2015-04-02: 0.4 mg via INTRAVENOUS
  Filled 2015-04-02: qty 5

## 2015-04-02 MED ORDER — TECHNETIUM TC 99M SESTAMIBI - CARDIOLITE
10.0000 | Freq: Once | INTRAVENOUS | Status: AC | PRN
  Administered 2015-04-02: 10.5 via INTRAVENOUS

## 2015-04-03 ENCOUNTER — Telehealth: Payer: Self-pay | Admitting: *Deleted

## 2015-04-03 NOTE — Telephone Encounter (Signed)
Notes Recorded by Laurine Blazer, LPN on [id number] at [id number] PM Left message to return call.

## 2015-04-03 NOTE — Telephone Encounter (Signed)
-----   Message from Bernita Raisin, RN sent at 04/02/2015  4:55 PM EST -----   ----- Message -----    From: Herminio Commons, MD    Sent: 04/02/2015   4:48 PM      To: Bernita Raisin, RN  No evidence of blockages. Low risk study.

## 2015-04-08 NOTE — Telephone Encounter (Signed)
Notes Recorded by Laurine Blazer, LPN on [id number] at 579FGE PM Left message to return call.

## 2015-04-08 NOTE — Telephone Encounter (Signed)
Returned call has been out of town

## 2015-04-10 ENCOUNTER — Encounter: Payer: Self-pay | Admitting: *Deleted

## 2015-04-10 NOTE — Telephone Encounter (Signed)
Notes Recorded by Laurine Blazer, LPN on [id number] at [id number] AM Copy to pmd. Follow up scheduled for 04/24/15 with Dr. Bronson Ing.  Notes Recorded by Laurine Blazer, LPN on [id number] at [id number] AM Patient notified of results by letter.

## 2015-04-23 ENCOUNTER — Ambulatory Visit: Payer: Medicare HMO | Admitting: Cardiovascular Disease

## 2015-04-24 ENCOUNTER — Encounter: Payer: Self-pay | Admitting: Cardiovascular Disease

## 2015-04-24 ENCOUNTER — Ambulatory Visit: Payer: Medicare HMO | Admitting: Cardiovascular Disease

## 2015-04-24 ENCOUNTER — Ambulatory Visit (INDEPENDENT_AMBULATORY_CARE_PROVIDER_SITE_OTHER): Payer: Medicare HMO | Admitting: Cardiovascular Disease

## 2015-04-24 VITALS — BP 132/74 | HR 86 | Ht 64.0 in | Wt 202.0 lb

## 2015-04-24 DIAGNOSIS — R079 Chest pain, unspecified: Secondary | ICD-10-CM

## 2015-04-24 DIAGNOSIS — I10 Essential (primary) hypertension: Secondary | ICD-10-CM

## 2015-04-24 DIAGNOSIS — I482 Chronic atrial fibrillation: Secondary | ICD-10-CM

## 2015-04-24 DIAGNOSIS — Z950 Presence of cardiac pacemaker: Secondary | ICD-10-CM

## 2015-04-24 DIAGNOSIS — K219 Gastro-esophageal reflux disease without esophagitis: Secondary | ICD-10-CM

## 2015-04-24 DIAGNOSIS — I4821 Permanent atrial fibrillation: Secondary | ICD-10-CM

## 2015-04-24 NOTE — Patient Instructions (Signed)
Continue all current medications. Follow up as needed  

## 2015-04-24 NOTE — Progress Notes (Signed)
Patient ID: Peter Howe, male   DOB: 1940/08/02, 75 y.o.   MRN: [id number]      SUBJECTIVE: The patient returns for follow-up after undergoing cardiovascular testing performed for the evaluation of chest pain. He underwent a low risk nuclear stress test on 04/02/15 with no gross ischemic abnormalities, calculated LVEF 65%.  Takes omeprazole without relief. Only experiences symptoms of "indigestion" when walking on treadmill. Has not had to use SL nitroglycerin.  Review of Systems: As per "subjective", otherwise negative.  Allergies  Allergen Reactions  . Penicillins Rash  . Sulfonamide Derivatives Rash    Current Outpatient Prescriptions  Medication Sig Dispense Refill  . atenolol (TENORMIN) 100 MG tablet Take 100 mg by mouth daily.      . dabigatran (PRADAXA) 150 MG CAPS capsule Take 1 capsule (150 mg total) by mouth 2 (two) times daily. 120 capsule 3  . losartan (COZAAR) 50 MG tablet Take 50 mg by mouth daily.    . nitroGLYCERIN (NITROSTAT) 0.4 MG SL tablet Place 1 tablet (0.4 mg total) under the tongue every 5 (five) minutes as needed for chest pain. 25 tablet 3  . zolpidem (AMBIEN) 10 MG tablet Take 5 mg by mouth at bedtime as needed for sleep.      No current facility-administered medications for this visit.    Past Medical History  Diagnosis Date  . Tachycardia-bradycardia ([place])     s/p PPM  . Hypertension   . Permanent atrial fibrillation ([place])   . Overweight(278.02)     obesity    Past Surgical History  Procedure Laterality Date  . Pacemaker insertion  03/28/03    St. Jude-PPM    Social History   Social History  . Marital Status: Widowed    Spouse Name: N/A  . Number of Children: N/A  . Years of Education: N/A   Occupational History  .      FULL TIME   Social History Main Topics  . Smoking status: Former Smoker -- 1.00 packs/day for 30 years    Types: Cigarettes    Start date: 03/30/1955    Quit date: 03/09/1993  . Smokeless tobacco: Never Used    . Alcohol Use: 0.0 oz/week    0 Standard drinks or equivalent per week  . Drug Use: No  . Sexual Activity: Not on file   Other Topics Concern  . Not on file   Social History Narrative   Owns a Engineer, technical sales, enjoys golf   Lives in [place]:   04/24/15 1238  BP: 132/74  Pulse: 86  Height: 5\' 4"  (1.626 m)  Weight: 202 lb (91.627 kg)    PHYSICAL EXAM General: NAD HEENT: Normal. Neck: No JVD, no thyromegaly. Lungs: Clear to auscultation bilaterally with normal respiratory effort. CV: Irregular rhythm, normal rate, normal S1/S2, no S3, no murmur. No pretibial or periankle edema. Abdomen: Soft, nontender, obese, no distention.  Neurologic: Alert and oriented x 3.  Psych: Normal affect. Skin: Normal. Musculoskeletal: Normal range of motion, no gross deformities. Extremities: No clubbing or cyanosis.   ECG: Most recent ECG reviewed.      ASSESSMENT AND PLAN: 1. Permanent atrial fibrillation: HR controlled on atenolol. Continue dabigatran for anticoagulation.  2. Tachy-brady syndrome s/p PPM: Normal pacemaker function in 08/2014. Follows with Dr. Rayann Heman.  3. Essential HTN: Controlled. No changes.  4. "Indigestion" with exertion/chest discomfort: While symptoms were suspicious for ischemic heart disease, Lexiscan Cardiolite stress test was low risk. No further investigations  are warranted at this time. Recommend he f/u with PCP and perhaps GI.  Dispo: f/u with me prn. Follow up with Dr. Rayann Heman as scheduled.   Kate Sable, M.D., F.A.C.C.

## 2015-06-11 ENCOUNTER — Encounter: Payer: Self-pay | Admitting: Internal Medicine

## 2015-07-22 ENCOUNTER — Other Ambulatory Visit: Payer: Self-pay | Admitting: Cardiology

## 2015-09-13 ENCOUNTER — Encounter: Payer: Medicare HMO | Admitting: Internal Medicine

## 2015-09-20 ENCOUNTER — Encounter: Payer: Medicare HMO | Admitting: Internal Medicine

## 2015-10-04 ENCOUNTER — Ambulatory Visit (INDEPENDENT_AMBULATORY_CARE_PROVIDER_SITE_OTHER): Payer: Medicare HMO | Admitting: Internal Medicine

## 2015-10-04 ENCOUNTER — Encounter: Payer: Self-pay | Admitting: Internal Medicine

## 2015-10-04 VITALS — BP 138/88 | HR 79 | Ht 64.0 in | Wt 199.0 lb

## 2015-10-04 DIAGNOSIS — I495 Sick sinus syndrome: Secondary | ICD-10-CM

## 2015-10-04 DIAGNOSIS — Z950 Presence of cardiac pacemaker: Secondary | ICD-10-CM

## 2015-10-04 DIAGNOSIS — I4821 Permanent atrial fibrillation: Secondary | ICD-10-CM

## 2015-10-04 DIAGNOSIS — I482 Chronic atrial fibrillation: Secondary | ICD-10-CM

## 2015-10-04 LAB — CUP PACEART INCLINIC DEVICE CHECK
Battery Impedance: 4200 Ohm
Brady Statistic RV Percent Paced: 31 %
Date Time Interrogation Session: 20170728151805
Implantable Lead Implant Date: 20050119
Lead Channel Impedance Value: 777 Ohm
Lead Channel Setting Pacing Pulse Width: 0.4 ms
Lead Channel Setting Sensing Sensitivity: 2 mV
MDC IDC LEAD IMPLANT DT: 19980120
MDC IDC LEAD LOCATION: 753859
MDC IDC LEAD LOCATION: 753860
MDC IDC MSMT BATTERY VOLTAGE: 2.75 V
MDC IDC MSMT LEADCHNL RV PACING THRESHOLD AMPLITUDE: 0.5 V
MDC IDC MSMT LEADCHNL RV PACING THRESHOLD PULSEWIDTH: 0.4 ms
MDC IDC MSMT LEADCHNL RV SENSING INTR AMPL: 16.6 mV
MDC IDC SET LEADCHNL RV PACING AMPLITUDE: 2.5 V
Pulse Gen Model: 5376
Pulse Gen Serial Number: 927381

## 2015-10-04 NOTE — Patient Instructions (Addendum)
Medication Instructions:   Your physician recommends that you continue on your current medications as directed. Please refer to the Current Medication list given to you today.  Labwork: NONE  Testing/Procedures: NONE  Follow-Up: Your physician recommends that you schedule a follow-up appointment in: 1 year with Dr. Rayann Heman. Please schedule this appointment today before leaving the office.  Any Other Special Instructions Will Be Listed Below (If Applicable).  If you need a refill on your cardiac medications before your next appointment, please call your pharmacy.

## 2015-10-04 NOTE — Progress Notes (Signed)
PCP: Sherrie Mustache, MD  FLORES BARRIERE is a 75 y.o. male who presents today for routine electrophysiology followup.  Since last being seen in our clinic, the patient reports doing very well.  He remains active and continues to play golf.  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  He is without complaint today.   Today, he denies symptoms of palpitations, SOB, chest pain,  lower extremity edema, dizziness, presyncope, or syncope.  The patient is otherwise without complaint today.   Past Medical History:  Diagnosis Date  . Hypertension   . Overweight(278.02)    obesity  . Permanent atrial fibrillation ([place])   . Tachycardia-bradycardia [facility])    s/p PPM   Past Surgical History:  Procedure Laterality Date  . PACEMAKER INSERTION  03/28/03   St. Jude-PPM    Current Outpatient Prescriptions  Medication Sig Dispense Refill  . atenolol (TENORMIN) 100 MG tablet Take 100 mg by mouth daily.      Marland Kitchen losartan (COZAAR) 50 MG tablet Take 50 mg by mouth daily.    . nitroGLYCERIN (NITROSTAT) 0.4 MG SL tablet Place 1 tablet (0.4 mg total) under the tongue every 5 (five) minutes as needed for chest pain. 25 tablet 3  . PRADAXA 150 MG CAPS capsule TAKE 1 CAPSULE BY MOUTH TWICE DAILY 60 capsule 5  . zolpidem (AMBIEN) 10 MG tablet Take 5 mg by mouth at bedtime as needed for sleep.      No current facility-administered medications for this visit.     Physical Exam: Vitals:   10/04/15 1126  BP: 138/88  Pulse: 79  SpO2: 98%  Weight: 199 lb (90.3 kg)  Height: 5\' 4"  (1.626 m)    GEN- The patient is well appearing, alert and oriented x 3 today.   Head- normocephalic, atraumatic Eyes-  Sclera clear, conjunctiva pink Ears- hearing intact Oropharynx- clear Lungs- Clear to ausculation bilaterally, normal work of breathing Chest- pacemaker pocket is well healed Heart- irregular rate and rhythm, no murmurs, rubs or gallops, PMI not laterally displaced GI-  soft, NT, ND, + BS Extremities- no clubbing, cyanosis, or edema  Pacemaker interrogation- reviewed in detail today,  See PACEART report  Assessment and Plan:  1.   afib Permanent afib  Continue pradaxa Dr Edrick Oh follows [facility] per patient  2. Symptomatic bradycardia Normal pacemaker function See Pace Art report No changes today  3. htn Stable No change required today Salt restriction and weight reduction are encouraged  Return in 1 year   [name] MD, [facility] 10/04/2015 11:59 AM

## 2015-10-10 ENCOUNTER — Encounter: Payer: Self-pay | Admitting: Internal Medicine

## 2015-11-05 ENCOUNTER — Emergency Department (HOSPITAL_COMMUNITY): Payer: Medicare HMO

## 2015-11-05 ENCOUNTER — Observation Stay (HOSPITAL_COMMUNITY)
Admission: EM | Admit: 2015-11-05 | Discharge: 2015-11-06 | Disposition: A | Discharge status: Home / Self Care | Payer: Medicare HMO | Attending: Family Medicine | Admitting: Family Medicine

## 2015-11-05 ENCOUNTER — Encounter (HOSPITAL_COMMUNITY): Payer: Self-pay | Admitting: Emergency Medicine

## 2015-11-05 DIAGNOSIS — I4891 Unspecified atrial fibrillation: Secondary | ICD-10-CM | POA: Diagnosis present

## 2015-11-05 DIAGNOSIS — Z79899 Other long term (current) drug therapy: Secondary | ICD-10-CM | POA: Insufficient documentation

## 2015-11-05 DIAGNOSIS — R112 Nausea with vomiting, unspecified: Secondary | ICD-10-CM | POA: Insufficient documentation

## 2015-11-05 DIAGNOSIS — I10 Essential (primary) hypertension: Secondary | ICD-10-CM | POA: Diagnosis not present

## 2015-11-05 DIAGNOSIS — R0602 Shortness of breath: Secondary | ICD-10-CM | POA: Diagnosis not present

## 2015-11-05 DIAGNOSIS — I482 Chronic atrial fibrillation: Secondary | ICD-10-CM

## 2015-11-05 DIAGNOSIS — R1084 Generalized abdominal pain: Principal | ICD-10-CM | POA: Insufficient documentation

## 2015-11-05 DIAGNOSIS — R079 Chest pain, unspecified: Secondary | ICD-10-CM | POA: Diagnosis present

## 2015-11-05 DIAGNOSIS — R109 Unspecified abdominal pain: Secondary | ICD-10-CM | POA: Diagnosis present

## 2015-11-05 DIAGNOSIS — M542 Cervicalgia: Secondary | ICD-10-CM | POA: Diagnosis not present

## 2015-11-05 DIAGNOSIS — Z87891 Personal history of nicotine dependence: Secondary | ICD-10-CM | POA: Insufficient documentation

## 2015-11-05 DIAGNOSIS — R1011 Right upper quadrant pain: Secondary | ICD-10-CM | POA: Diagnosis not present

## 2015-11-05 LAB — BASIC METABOLIC PANEL
Anion gap: 9 (ref 5–15)
BUN: 17 mg/dL (ref 6–20)
CALCIUM: 9.4 mg/dL (ref 8.9–10.3)
CO2: 28 mmol/L (ref 22–32)
CREATININE: 1.19 mg/dL (ref 0.61–1.24)
Chloride: 102 mmol/L (ref 101–111)
GFR calc non Af Amer: 58 mL/min — ABNORMAL LOW (ref >60)
Glucose, Bld: 134 mg/dL — ABNORMAL HIGH (ref 65–99)
Potassium: 4.2 mmol/L (ref 3.5–5.1)
SODIUM: 139 mmol/L (ref 135–145)

## 2015-11-05 LAB — BLOOD GAS, VENOUS
ACID-BASE EXCESS: 3.2 mmol/L — AB (ref 0.0–2.0)
Bicarbonate: 23.7 mmol/L (ref 20.0–28.0)
O2 Saturation: 51.8 %
PH VEN: 7.279 (ref 7.250–7.430)
pCO2, Ven: 64.8 mmHg — ABNORMAL HIGH (ref 44.0–60.0)
pO2, Ven: 33.5 mmHg (ref 32.0–45.0)

## 2015-11-05 LAB — CBC WITH DIFFERENTIAL/PLATELET
BASOS PCT: 0 %
Basophils Absolute: 0 10*3/uL (ref 0.0–0.1)
EOS ABS: 0.1 10*3/uL (ref 0.0–0.7)
EOS PCT: 1 %
HCT: 50.1 % (ref 39.0–52.0)
Hemoglobin: 17.3 g/dL — ABNORMAL HIGH (ref 13.0–17.0)
Lymphocytes Relative: 10 %
Lymphs Abs: 1 10*3/uL (ref 0.7–4.0)
MCH: 33.7 pg (ref 26.0–34.0)
MCHC: 34.5 g/dL (ref 30.0–36.0)
MCV: 97.7 fL (ref 78.0–100.0)
MONO ABS: 0.5 10*3/uL (ref 0.1–1.0)
MONOS PCT: 5 %
NEUTROS PCT: 84 %
Neutro Abs: 8.9 10*3/uL — ABNORMAL HIGH (ref 1.7–7.7)
Platelets: 162 10*3/uL (ref 150–400)
RBC: 5.13 MIL/uL (ref 4.22–5.81)
RDW: 14.5 % (ref 11.5–15.5)
WBC: 10.6 10*3/uL — ABNORMAL HIGH (ref 4.0–10.5)

## 2015-11-05 LAB — HEPATIC FUNCTION PANEL
ALT: 30 U/L (ref 17–63)
AST: 29 U/L (ref 15–41)
Albumin: 4.7 g/dL (ref 3.5–5.0)
Alkaline Phosphatase: 68 U/L (ref 38–126)
BILIRUBIN DIRECT: 0.1 mg/dL (ref 0.1–0.5)
BILIRUBIN INDIRECT: 0.5 mg/dL (ref 0.3–0.9)
BILIRUBIN TOTAL: 0.6 mg/dL (ref 0.3–1.2)
Total Protein: 7.5 g/dL (ref 6.5–8.1)

## 2015-11-05 LAB — TROPONIN I: Troponin I: <0.03 ng/mL (ref <0.03)

## 2015-11-05 LAB — LIPASE, BLOOD: LIPASE: 23 U/L (ref 11–51)

## 2015-11-05 MED ORDER — ATENOLOL 25 MG PO TABS
100.0000 mg | ORAL_TABLET | Freq: Every day | ORAL | Status: DC
  Administered 2015-11-06: 100 mg via ORAL
  Filled 2015-11-05: qty 4

## 2015-11-05 MED ORDER — HYDROMORPHONE HCL 1 MG/ML IJ SOLN
1.0000 mg | Freq: Once | INTRAMUSCULAR | Status: AC
  Administered 2015-11-05: 1 mg via INTRAVENOUS

## 2015-11-05 MED ORDER — ASPIRIN 325 MG PO TABS
325.0000 mg | ORAL_TABLET | Freq: Once | ORAL | Status: AC
  Administered 2015-11-05: 325 mg via ORAL
  Filled 2015-11-05: qty 1

## 2015-11-05 MED ORDER — PANTOPRAZOLE SODIUM 40 MG PO TBEC
40.0000 mg | DELAYED_RELEASE_TABLET | Freq: Every day | ORAL | Status: DC
  Administered 2015-11-06: 40 mg via ORAL
  Filled 2015-11-05: qty 1

## 2015-11-05 MED ORDER — LIDOCAINE-EPINEPHRINE (PF) 2 %-1:200000 IJ SOLN
INTRAMUSCULAR | Status: AC
  Filled 2015-11-05: qty 20

## 2015-11-05 MED ORDER — ENOXAPARIN SODIUM 40 MG/0.4ML ~~LOC~~ SOLN
40.0000 mg | SUBCUTANEOUS | Status: DC
  Administered 2015-11-05: 40 mg via SUBCUTANEOUS
  Filled 2015-11-05: qty 0.4

## 2015-11-05 MED ORDER — SODIUM CHLORIDE 0.9% FLUSH: 3.0000 mL | INTRAVENOUS | Status: DC | PRN

## 2015-11-05 MED ORDER — LOSARTAN POTASSIUM 50 MG PO TABS
50.0000 mg | ORAL_TABLET | Freq: Every day | ORAL | Status: DC
  Administered 2015-11-06: 50 mg via ORAL
  Filled 2015-11-05: qty 1

## 2015-11-05 MED ORDER — POVIDONE-IODINE 10 % EX SOLN
CUTANEOUS | Status: AC
  Filled 2015-11-05: qty 118

## 2015-11-05 MED ORDER — GI COCKTAIL ~~LOC~~: 30.0000 mL | Freq: Three times a day (TID) | ORAL | Status: DC | PRN

## 2015-11-05 MED ORDER — SODIUM CHLORIDE 0.9% FLUSH
3.0000 mL | Freq: Two times a day (BID) | INTRAVENOUS | Status: DC
  Administered 2015-11-05 – 2015-11-06 (×2): 3 mL via INTRAVENOUS

## 2015-11-05 MED ORDER — SODIUM CHLORIDE 0.9 % IV SOLN: 250.0000 mL | INTRAVENOUS | Status: DC | PRN

## 2015-11-05 MED ORDER — IOPAMIDOL (ISOVUE-300) INJECTION 61%
100.0000 mL | Freq: Once | INTRAVENOUS | Status: AC | PRN
  Administered 2015-11-05: 100 mL via INTRAVENOUS

## 2015-11-05 MED ORDER — HYDROCODONE-ACETAMINOPHEN 5-325 MG PO TABS: 1.0000 | ORAL_TABLET | ORAL | Status: DC | PRN

## 2015-11-05 MED ORDER — IOPAMIDOL (ISOVUE-300) INJECTION 61%
INTRAVENOUS | Status: AC
  Filled 2015-11-05: qty 30

## 2015-11-05 MED ORDER — ZOLPIDEM TARTRATE 5 MG PO TABS
5.0000 mg | ORAL_TABLET | Freq: Every evening | ORAL | Status: DC | PRN
Start: 2015-11-05 — End: 2015-11-06
  Administered 2015-11-05: 5 mg via ORAL
  Filled 2015-11-05: qty 1

## 2015-11-05 MED ORDER — GI COCKTAIL ~~LOC~~
30.0000 mL | Freq: Once | ORAL | Status: AC
  Administered 2015-11-05: 30 mL via ORAL
  Filled 2015-11-05: qty 30

## 2015-11-05 MED ORDER — KETOROLAC TROMETHAMINE 15 MG/ML IJ SOLN: 15.0000 mg | Freq: Four times a day (QID) | INTRAMUSCULAR | Status: DC | PRN

## 2015-11-05 MED ORDER — HYDROMORPHONE HCL 1 MG/ML IJ SOLN
INTRAMUSCULAR | Status: AC
  Filled 2015-11-05: qty 1

## 2015-11-05 MED ORDER — ACETAMINOPHEN 650 MG RE SUPP: 650.0000 mg | Freq: Four times a day (QID) | RECTAL | Status: DC | PRN

## 2015-11-05 MED ORDER — ONDANSETRON HCL 4 MG PO TABS: 4.0000 mg | ORAL_TABLET | Freq: Four times a day (QID) | ORAL | Status: DC | PRN

## 2015-11-05 MED ORDER — MORPHINE SULFATE (PF) 4 MG/ML IV SOLN
4.0000 mg | Freq: Once | INTRAVENOUS | Status: AC
  Administered 2015-11-05: 4 mg via INTRAVENOUS
  Filled 2015-11-05: qty 1

## 2015-11-05 MED ORDER — ONDANSETRON HCL 4 MG/2ML IJ SOLN: 4.0000 mg | Freq: Four times a day (QID) | INTRAMUSCULAR | Status: DC | PRN

## 2015-11-05 MED ORDER — ACETAMINOPHEN 325 MG PO TABS
650.0000 mg | ORAL_TABLET | Freq: Four times a day (QID) | ORAL | Status: DC | PRN
  Administered 2015-11-06: 650 mg via ORAL
  Filled 2015-11-05: qty 2

## 2015-11-05 MED ORDER — ASPIRIN EC 81 MG PO TBEC
81.0000 mg | DELAYED_RELEASE_TABLET | Freq: Every day | ORAL | Status: DC
  Administered 2015-11-06: 81 mg via ORAL
  Filled 2015-11-05: qty 1

## 2015-11-05 MED ORDER — SODIUM CHLORIDE 0.9% FLUSH
3.0000 mL | Freq: Two times a day (BID) | INTRAVENOUS | Status: DC
  Administered 2015-11-06: 3 mL via INTRAVENOUS

## 2015-11-05 NOTE — ED Notes (Signed)
Patient transported to CT 

## 2015-11-05 NOTE — ED Notes (Addendum)
Pt reports pain is located in upper abdomen and started last night. Pt reports nausea,some vomiting last night.Tender with palpation to abdomen

## 2015-11-05 NOTE — ED Notes (Signed)
Per Dr Roderic Palau pt may have heart healthy diet

## 2015-11-05 NOTE — ED Provider Notes (Signed)
[facility] DEPT Provider Note   CSN: [id number] Arrival date & time: 11/05/15  [id number] History   Chief Complaint Chief Complaint  Patient presents with  . Chest Pain    HPI Peter Howe is a 75 y.o. male.  HPI  Patient presents with chest pain.   Reports pain began around 8 PM last night. Pain was originally located only abdominally, but then spread to his chest and neck. Denies radiation to arm. Pain is now primarily located in upper abdominal quadrants again. Describes the pain as sharp. Endorses associated nausea, two episodes of emesis, diaphoresis, and SOB. Has not taken anything for his symptoms. Has a history of a.fib on Pradaxa and pacemaker placement for tachy-brady.   Past Medical History:  Diagnosis Date  . Hypertension   . Overweight(278.02)    obesity  . Permanent atrial fibrillation ([place])   . 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 Shortness of breath 08/31/2012  . ATRIAL FIBRILLATION 01/02/2010  . OVERWEIGHT/OBESITY 12/05/2008  . Hypertension 12/05/2008  . 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  . PACEMAKER, PERMANENT 12/05/2008    Past Surgical History:  Procedure Laterality Date  . PACEMAKER INSERTION  03/28/03   St. Jude-PPM    Home Medications    Prior to Admission medications   Medication Sig Start Date End Date Taking? Authorizing Provider  atenolol (TENORMIN) 100 MG tablet Take 100 mg by mouth daily.     Yes Historical Provider, MD  losartan (COZAAR) 50 MG tablet Take 50 mg by mouth daily.   Yes Historical Provider, MD  Multiple Vitamin (MULTIVITAMIN WITH MINERALS) TABS tablet Take 1 tablet by mouth daily.   Yes Historical Provider, MD  nitroGLYCERIN (NITROSTAT) 0.4 MG SL tablet Place 1 tablet (0.4 mg total) under the tongue every 5 (five) minutes as needed for chest pain. 03/26/15  Yes Herminio Commons, MD  PRADAXA 150 MG CAPS capsule TAKE 1 CAPSULE BY MOUTH TWICE DAILY  07/22/15  Yes Herminio Commons, MD  zolpidem (AMBIEN) 10 MG tablet Take 5 mg by mouth at bedtime as needed for sleep.    Yes Historical Provider, MD    Family History History reviewed. No pertinent family history.  Social History Social History  Substance Use Topics  . Smoking status: Former Smoker    Packs/day: 1.00    Years: 30.00    Types: Cigarettes    Start date: 03/30/1955    Quit date: 03/09/1993  . Smokeless tobacco: Never Used  . Alcohol use 0.0 oz/week     Allergies   Penicillins and Sulfonamide derivatives Review of Systems Review of Systems  Constitutional: Positive for diaphoresis.  Respiratory: Positive for shortness of breath. Negative for chest tightness.   Cardiovascular: Positive for chest pain.  Gastrointestinal: Positive for abdominal pain, nausea and vomiting.  Musculoskeletal: Positive for neck pain. Negative for back pain.  Allergic/Immunologic: Negative for immunocompromised state.  Neurological: Negative for headaches.   Physical Exam Updated Vital Signs BP 144/84   Pulse 69   Temp 97.7 F (36.5 C)   Resp 13   Ht 5\' 4"  (1.626 m)   Wt 88.5 kg   SpO2 94%   BMI 33.47 kg/m   Physical Exam  Constitutional: He is oriented to person, place, and time.  Overweight male lying in bed in mild distress intermittently through encounter.   HENT:  Head: Normocephalic and atraumatic.  Right Ear: External ear normal.  Left  Ear: External ear normal.  Nose: Nose normal.  Mouth/Throat: No oropharyngeal exudate.  Dry, cracked lips. Slightly dry MM.  Eyes: Conjunctivae and EOM are normal. Pupils are equal, round, and reactive to light. Right eye exhibits no discharge. Left eye exhibits no discharge. No scleral icterus.  Neck: Normal range of motion. Neck supple.  Cardiovascular: Intact distal pulses.   Irregularly irregular   Pulmonary/Chest: Effort normal and breath sounds normal. No respiratory distress. He has no wheezes.  Abdominal: Soft. Bowel sounds  are normal. He exhibits no distension. There is tenderness (Diffusely). There is no rebound and no guarding.  Musculoskeletal: Normal range of motion.  Neurological: He is alert and oriented to person, place, and time. No cranial nerve deficit.  Skin: Skin is warm. He is diaphoretic.  Psychiatric: He has a normal mood and affect. His behavior is normal.   ED Treatments / Results  Labs (all labs ordered are listed, but only abnormal results are displayed) Labs Reviewed  CBC WITH DIFFERENTIAL/PLATELET - Abnormal; Notable for the following:       Result Value   WBC 10.6 (*)    Hemoglobin 17.3 (*)    Neutro Abs 8.9 (*)    All other components within normal limits  BASIC METABOLIC PANEL - Abnormal; Notable for the following:    Glucose, Bld 134 (*)    GFR calc non Af Amer 58 (*)    All other components within normal limits  BLOOD GAS, VENOUS - Abnormal; Notable for the following:    pCO2, Ven 64.8 (*)    Acid-Base Excess 3.2 (*)    All other components within normal limits  TROPONIN I  LIPASE, BLOOD  HEPATIC FUNCTION PANEL  TROPONIN I    EKG  EKG Interpretation  Date/Time:  Tuesday November 05 2015 06:35:25 EDT Ventricular Rate:  87 PR Interval:    QRS Duration: 80 QT Interval:  509 QTC Calculation: 613 R Axis:   20 Text Interpretation:  Atrial fibrillation that is new compared to prior Borderline repolarization abnormality Prolonged QT interval Confirmed by Kenna Gilbert, KRISTEN [phone]) on 11/05/2015 6:40:12 AM       Radiology Dg Chest 2 View  Result Date: 11/05/2015 CLINICAL DATA:  Central chest pain and upper abdominal pain, onset at 20:00. EXAM: CHEST  2 VIEW COMPARISON:  08/15/2012 FINDINGS: There is unchanged mild cardiomegaly. The lungs are clear except for stable linear scarring in the bases. Hilar and mediastinal contours are unremarkable and unchanged. The pulmonary vasculature is normal. No pleural effusions. There are grossly intact appearances of the transvenous  cardiac leads. IMPRESSION: Stable cardiomegaly.  No acute cardiopulmonary findings. Electronically Signed   By: Andreas Newport M.D.   On: 11/05/2015 06:59   Ct Abdomen W Contrast  Result Date: 11/05/2015 CLINICAL DATA:  Upper abdominal pain with nausea and vomiting. EXAM: CT ABDOMEN WITH CONTRAST TECHNIQUE: Multidetector CT imaging of the abdomen was performed using the standard protocol following bolus administration of intravenous contrast. CONTRAST:  124mL ISOVUE-300 IOPAMIDOL (ISOVUE-300) INJECTION 61% COMPARISON:  08/15/2012 CT abdomen/ pelvis. FINDINGS: Lower chest: No significant pulmonary nodules or acute consolidative airspace disease. Three lead pacemaker is noted with lead tips in the right atrium and right ventricular apex. Hepatobiliary: Diffuse hepatic steatosis. No liver mass. Tiny 3 mm radiodensity in the dependent gallbladder likely represents a tiny gallstone. No gallbladder wall thickening or pericholecystic fluid. No biliary ductal dilatation. Stable small periampullary duodenum diverticulum. Pancreas: Normal, with no mass or duct dilation. Spleen: Normal size. No  mass. Adrenals/Urinary Tract: Normal adrenals. Normal kidneys with no hydronephrosis and no renal mass. Stomach/Bowel: Grossly normal stomach. Visualized small and large bowel is normal caliber, with no bowel wall thickening. Mild diverticulosis scattered throughout the visualized colon. Vascular/Lymphatic: Atherosclerotic nonaneurysmal abdominal aorta. Patent portal, splenic, hepatic and renal veins. No pathologically enlarged lymph nodes in the abdomen. Other: No pneumoperitoneum, ascites or focal fluid collection. Musculoskeletal: No aggressive appearing focal osseous lesions. Moderate thoracolumbar spondylosis. IMPRESSION: 1. No acute abnormality mild colonic diverticulosis, with no evidence of acute diverticulitis. 2. Probable cholelithiasis, with no evidence of acute cholecystitis. 3. Additional findings include aortic  atherosclerosis and diffuse hepatic steatosis. Electronically Signed   By: Ilona Sorrel M.D.   On: 11/05/2015 09:59    Procedures Procedures (including critical care time)  Medications Ordered in ED Medications  iopamidol (ISOVUE-300) 61 % injection (not administered)  morphine 4 MG/ML injection 4 mg (4 mg Intravenous Given 11/05/15 0726)  aspirin tablet 325 mg (325 mg Oral Given 11/05/15 0725)  gi cocktail (Maalox,Lidocaine,Donnatal) (30 mLs Oral Given 11/05/15 0725)  morphine 4 MG/ML injection 4 mg (4 mg Intravenous Given 11/05/15 0809)  HYDROmorphone (DILAUDID) injection 1 mg (1 mg Intravenous Given 11/05/15 0858)  iopamidol (ISOVUE-300) 61 % injection 100 mL (100 mLs Intravenous Contrast Given 11/05/15 0925)   Initial Impression / Assessment and Plan / ED Course  I have reviewed the triage vital signs and the nursing notes.  Pertinent labs & imaging results that were available during my care of the patient were reviewed by me and considered in my medical decision making (see chart for details).  Clinical Course   0720 Initial trop neg. ASA and morphine ordered. Will also give GI cocktail as primary complaint is abdominal pain.   0740 Abdominal US with slightly thickened gallbladder wall. Unable to visualize aorta. Patient still with pain after 4mg  morphine. Will give another 4mg  and proceed with abdominal CT.  1000 CT abd with mild colonic diverticulosis and probable cholelithiasis. Formal US ordered to clarify possible presence of gallstones. Delta trop pending.  Final Clinical Impressions(s) / ED Diagnoses   Final diagnoses:  Abdominal pain   75 yo M presenting with chest and abdominal pain. Patient remains diaphoretic and in distress despite receiving multiple doses of pain medication. Cardiac work-up negative so far, but patient with significant cardiac history. Possible cholelithiasis as cause of pain, however imaging not clear thus far. Consulted hospital, who will admit.    New Prescriptions New Prescriptions   No medications on file     Verner Mould, MD 11/05/15 [place]    Elnora Morrison, MD 11/05/15 [phone]

## 2015-11-05 NOTE — ED Notes (Signed)
Rechecked sat  92% on room air

## 2015-11-05 NOTE — H&P (Signed)
History and Physical    Peter Howe [id number] DOB: Feb 21, 1941 DOA: 11/05/2015  PCP: Sherrie Mustache, MD   Chief Complaint: Chest and abdominal pain   HPI: Peter Howe is a 75 y.o. male with a past medical history significant of afib, HTN, obesity, s/p pacemaker presents with complaints of indigestion characterized by belching and heartburn that began last night at 8:30pm. This progressed to chest and abdominal pain. He states the abdominal pain was sharp and radiated across his upper abdomen. This was associated with nausea and vomiting. He reports that belching did not improve his discomfort. He reports that the discomfort radiated up into his chest and toward his left shoulder. He has not had any worsening SOB. Bowel movements have otherwise been normal. Pain persisted throughout the night and resolved after receiving IV pain medicine in the ER.  ED Course: US abdomen is negative. EKG revealed atrial fibrillation with no acute changes. CXR unrevealing. CT abdomen was unrevealing. Pt received IV pain medicine which did improve symptoms. He's being admitted for further workup.   Review of Systems: As per HPI otherwise 10 point review of systems negative.    Past Medical History:  Diagnosis Date  . Hypertension   . Overweight(278.02)    obesity  . Permanent atrial fibrillation ([place])   . 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 PACEMAKER INSERTION  03/28/03   St. Jude-PPM     reports that he quit smoking about 22 years ago. His smoking use included Cigarettes. He started smoking about 60 years ago. He has a 30.00 pack-year smoking history. He has never used smokeless tobacco. He reports that he drinks alcohol. He reports that he does not use drugs.  Allergies  Allergen Reactions  . Penicillins Rash    Has patient had a PCN reaction causing immediate rash, facial/tongue/throat swelling, SOB or lightheadedness  with hypotension: No Has patient had a PCN reaction causing severe rash involving mucus membranes or skin necrosis: No Has patient had a PCN reaction that required hospitalization No Has patient had a PCN reaction occurring within the last 10 years: No If all of the above answers are "NO", then may proceed with Cephalosporin use.   . Sulfonamide Derivatives Rash   Family history: Family history reviewed and not pertinent.   Prior to Admission medications   Medication Sig Start Date End Date Taking? Authorizing Provider  atenolol (TENORMIN) 100 MG tablet Take 100 mg by mouth daily.     Yes Historical Provider, MD  losartan (COZAAR) 50 MG tablet Take 50 mg by mouth daily.   Yes Historical Provider, MD  Multiple Vitamin (MULTIVITAMIN WITH MINERALS) TABS tablet Take 1 tablet by mouth daily.   Yes Historical Provider, MD  nitroGLYCERIN (NITROSTAT) 0.4 MG SL tablet Place 1 tablet (0.4 mg total) under the tongue every 5 (five) minutes as needed for chest pain. 03/26/15  Yes Herminio Commons, MD  PRADAXA 150 MG CAPS capsule TAKE 1 CAPSULE BY MOUTH TWICE DAILY 07/22/15  Yes Herminio Commons, MD  zolpidem (AMBIEN) 10 MG tablet Take 5 mg by mouth at bedtime as needed for sleep.    Yes Historical Provider, MD    Physical Exam: Vitals:   11/05/15 1630 11/05/15 1700 11/05/15 1730 11/05/15 1800  BP: 133/90 137/93 138/84 132/96  Pulse: (!) 57 64    Resp: 13 18 19 17   Temp:      SpO2: 93% 91%  Weight:      Height:          Constitutional: NAD, calm, comfortable Vitals:   11/05/15 1630 11/05/15 1700 11/05/15 1730 11/05/15 1800  BP: 133/90 137/93 138/84 132/96  Pulse: (!) 57 64    Resp: 13 18 19 17   Temp:      SpO2: 93% 91%    Weight:      Height:       Eyes: PERRL, lids and conjunctivae normal ENMT: Mucous membranes are moist. Posterior pharynx clear of any exudate or lesions.Normal dentition.  Neck: normal, supple, no masses, no thyromegaly Respiratory: clear to auscultation  bilaterally, no wheezing, no crackles. Normal respiratory effort. No accessory muscle use.  Cardiovascular: Irregular rate and rhythm, no murmurs / rubs / gallops. LE extremity edema 1+ edema.  2+ pedal pulses. No carotid bruits.  Abdomen: Tenderness in the RUQ, no masses palpated. No hepatosplenomegaly. Bowel sounds positive.  Musculoskeletal: no clubbing / cyanosis. No joint deformity upper and lower extremities. Good ROM, no contractures. Normal muscle tone.  Skin: no rashes, lesions, ulcers. No induration Neurologic: CN 2-12 grossly intact. Sensation intact, DTR normal. Strength 5/5 in all 4.  Psychiatric: Normal judgment and insight. Alert and oriented x 3. Normal mood.     Labs on Admission: I have personally reviewed following labs and imaging studies  CBC:  Recent Labs Lab 11/05/15 0638  WBC 10.6*  NEUTROABS 8.9*  HGB 17.3*  HCT 50.1  MCV 97.7  PLT 0000000   Basic Metabolic Panel:  Recent Labs Lab 11/05/15 0638  NA 139  K 4.2  CL 102  CO2 28  GLUCOSE 134*  BUN 17  CREATININE 1.19  CALCIUM 9.4   GFR: Estimated Creatinine Clearance: 53.8 mL/min (by C-G formula based on SCr of 1.19 mg/dL). Liver Function Tests:  Recent Labs Lab 11/05/15 0638  AST 29  ALT 30  ALKPHOS 68  BILITOT 0.6  PROT 7.5  ALBUMIN 4.7    Recent Labs Lab 11/05/15 0638  LIPASE 23   No results for input(s): AMMONIA in the last 168 hours. Coagulation Profile: No results for input(s): INR, PROTIME in the last 168 hours. Cardiac Enzymes:  Recent Labs Lab 11/05/15 0638 11/05/15 1030  TROPONINI <0.03 <0.03   BNP (last 3 results) No results for input(s): PROBNP in the last 8760 hours. HbA1C: No results for input(s): HGBA1C in the last 72 hours. CBG: No results for input(s): GLUCAP in the last 168 hours. Lipid Profile: No results for input(s): CHOL, HDL, LDLCALC, TRIG, CHOLHDL, LDLDIRECT in the last 72 hours. Thyroid Function Tests: No results for input(s): TSH, T4TOTAL, FREET4,  T3FREE, THYROIDAB in the last 72 hours. Anemia Panel: No results for input(s): VITAMINB12, FOLATE, FERRITIN, TIBC, IRON, RETICCTPCT in the last 72 hours. Urine analysis:    Component Value Date/Time   COLORURINE YELLOW 08/15/2012 1119   APPEARANCEUR CLEAR 08/15/2012 1119   LABSPEC <1.005 (L) 08/15/2012 1119   PHURINE 6.0 08/15/2012 1119   GLUCOSEU NEGATIVE 08/15/2012 1119   HGBUR SMALL (A) 08/15/2012 1119   BILIRUBINUR NEGATIVE 08/15/2012 1119   KETONESUR NEGATIVE 08/15/2012 1119   PROTEINUR NEGATIVE 08/15/2012 1119   UROBILINOGEN 0.2 08/15/2012 1119   NITRITE NEGATIVE 08/15/2012 1119   LEUKOCYTESUR NEGATIVE 08/15/2012 1119   Sepsis Labs: !!!!!!!!!!!!!!!!!!!!!!!!!!!!!!!!!!!!!!!!!!!! @LABRCNTIP (procalcitonin:4,lacticidven:4) )No results found for this or any previous visit (from the past 240 hour(s)).   Radiological Exams on Admission: Dg Chest 2 View  Result Date: 11/05/2015 CLINICAL DATA:  Central chest pain and upper abdominal  pain, onset at 20:00. EXAM: CHEST  2 VIEW COMPARISON:  08/15/2012 FINDINGS: There is unchanged mild cardiomegaly. The lungs are clear except for stable linear scarring in the bases. Hilar and mediastinal contours are unremarkable and unchanged. The pulmonary vasculature is normal. No pleural effusions. There are grossly intact appearances of the transvenous cardiac leads. IMPRESSION: Stable cardiomegaly.  No acute cardiopulmonary findings. Electronically Signed   By: Andreas Newport M.D.   On: 11/05/2015 06:59   Ct Abdomen W Contrast  Result Date: 11/05/2015 CLINICAL DATA:  Upper abdominal pain with nausea and vomiting. EXAM: CT ABDOMEN WITH CONTRAST TECHNIQUE: Multidetector CT imaging of the abdomen was performed using the standard protocol following bolus administration of intravenous contrast. CONTRAST:  171mL ISOVUE-300 IOPAMIDOL (ISOVUE-300) INJECTION 61% COMPARISON:  08/15/2012 CT abdomen/ pelvis. FINDINGS: Lower chest: No significant pulmonary nodules  or acute consolidative airspace disease. Three lead pacemaker is noted with lead tips in the right atrium and right ventricular apex. Hepatobiliary: Diffuse hepatic steatosis. No liver mass. Tiny 3 mm radiodensity in the dependent gallbladder likely represents a tiny gallstone. No gallbladder wall thickening or pericholecystic fluid. No biliary ductal dilatation. Stable small periampullary duodenum diverticulum. Pancreas: Normal, with no mass or duct dilation. Spleen: Normal size. No mass. Adrenals/Urinary Tract: Normal adrenals. Normal kidneys with no hydronephrosis and no renal mass. Stomach/Bowel: Grossly normal stomach. Visualized small and large bowel is normal caliber, with no bowel wall thickening. Mild diverticulosis scattered throughout the visualized colon. Vascular/Lymphatic: Atherosclerotic nonaneurysmal abdominal aorta. Patent portal, splenic, hepatic and renal veins. No pathologically enlarged lymph nodes in the abdomen. Other: No pneumoperitoneum, ascites or focal fluid collection. Musculoskeletal: No aggressive appearing focal osseous lesions. Moderate thoracolumbar spondylosis. IMPRESSION: 1. No acute abnormality mild colonic diverticulosis, with no evidence of acute diverticulitis. 2. Probable cholelithiasis, with no evidence of acute cholecystitis. 3. Additional findings include aortic atherosclerosis and diffuse hepatic steatosis. Electronically Signed   By: Ilona Sorrel M.D.   On: 11/05/2015 09:59   US Abdomen Limited Ruq  Result Date: 11/05/2015 CLINICAL DATA:  Abdominal pain x1 day. Recent abdomen CT suggested cholelithiasis. EXAM: US ABDOMEN LIMITED - RIGHT UPPER QUADRANT COMPARISON:  CT 11/05/2015 FINDINGS: Gallbladder: No gallstones or wall thickening visualized. No sonographic Murphy sign noted by sonographer. Common bile duct: Diameter: 3 mm, unremarkable Liver: No focal lesion identified. Within normal limits in parenchymal echogenicity. IMPRESSION: Negative.  No gallstones seen.  Electronically Signed   By: Lucrezia Europe M.D.   On: 11/05/2015 11:06    EKG: Independently reviewed. EKG revealed atrial fibrillation with no acute changes   Assessment/Plan Active Problems:   Hypertension   ATRIAL FIBRILLATION   Chest pain   Abdominal pain  1. Chest pain/abdominal pain. Etiology is unclear. Will monitor on telemetry and cycle cardiac enzymes. LFTs and lipase are noted to be normal. Abdominal US was found to be negative. Since he does have significant RUQ tenderness on palpation and has had abd pain with vomiting, will check HIDA scan. Start empirically on PPI and GI cocktail PRN 2. Chronic Afib. EKG revealed atrial fibrillation with no acute changes. Continue atenolol. Hold pradaxa for now until determination can be made about his gallbladder. 3. HTN. Continue losartan and atenolol.  4. Obesity. Noted.    DVT prophylaxis: Lovenox Code Status: Full Family Communication: Family bedside Disposition Plan: Discharge home once improved  Consults called: None  Admission status: Admitted observation telemetry   Kathie Dike, MD [facility] If 7PM-7AM, please contact night-coverage [email or web address] Password [facility]  11/05/2015, 6:19 PM  By signing my name below, I, Collene Leyden, attest that this documentation has been prepared under the direction and in the presence of Kathie Dike, MD. Electronically signed: Collene Leyden, Scribe. 11/05/15 2:08PM  I, Dr. Kathie Dike, personally performed the services described in this documentaiton. All medical record entries made by the scribe were at my direction and in my presence. I have reviewed the chart and agree that the record reflects my personal performance and is accurate and complete  Kathie Dike, MD, 11/05/2015 6:19 PM

## 2015-11-05 NOTE — ED Provider Notes (Addendum)
MSE was initiated and I personally evaluated the patient and placed orders (if any) at  6:40 AM on November 05, 2015.  The patient appears stable so that the remainder of the MSE may be completed by another provider.  Pt is a 75 y.o. M with hypertension, atrial fibrillation on Pradaxa, tachycardia - bradycardia s/p pacemaker who presents emergency department with chest pain, shortness of breath, nausea, diaphoresis. Also complains of some upper abdominal pain. Currently patient is hemodynamically stable. Blood pressure now is 160/90s and a heart rate in the 70s. He is in atrial fibrillation which is chronic for him. Will start cardiac workup. I feel he is stable and can wait for the oncoming provider to finish assessment, workup, disposition.    Vitals:   11/05/15 0634  BP: (!) 172/101  Pulse: 79  Resp: 18  Temp: 97.7 F (36.5 C)     EKG Interpretation  Date/Time:  Tuesday November 05 2015 06:35:25 EDT Ventricular Rate:  87 PR Interval:    QRS Duration: 80 QT Interval:  509 QTC Calculation: 613 R Axis:   20 Text Interpretation:  Atrial fibrillation that is new compared to prior Borderline repolarization abnormality Prolonged QT interval Confirmed by Alisha Burgo,  DO, Aleiya Rye [phone]) on 11/05/2015 6:40:12 AM             [place], DO 11/05/15 [id number]

## 2015-11-05 NOTE — ED Triage Notes (Signed)
Pt c/o chest pain with nausea since last night that radiates into neck.

## 2015-11-06 ENCOUNTER — Observation Stay (HOSPITAL_COMMUNITY): Payer: Medicare HMO

## 2015-11-06 DIAGNOSIS — R079 Chest pain, unspecified: Secondary | ICD-10-CM | POA: Diagnosis not present

## 2015-11-06 DIAGNOSIS — R109 Unspecified abdominal pain: Secondary | ICD-10-CM | POA: Diagnosis not present

## 2015-11-06 LAB — COMPREHENSIVE METABOLIC PANEL
ALT: 22 U/L (ref 17–63)
AST: 22 U/L (ref 15–41)
Albumin: 3.8 g/dL (ref 3.5–5.0)
Alkaline Phosphatase: 53 U/L (ref 38–126)
Anion gap: 6 (ref 5–15)
BUN: 13 mg/dL (ref 6–20)
CHLORIDE: 104 mmol/L (ref 101–111)
CO2: 29 mmol/L (ref 22–32)
CREATININE: 1.02 mg/dL (ref 0.61–1.24)
Calcium: 8.8 mg/dL — ABNORMAL LOW (ref 8.9–10.3)
GFR calc Af Amer: >60 mL/min (ref >60)
GFR calc non Af Amer: >60 mL/min (ref >60)
Glucose, Bld: 103 mg/dL — ABNORMAL HIGH (ref 65–99)
Potassium: 4.3 mmol/L (ref 3.5–5.1)
SODIUM: 139 mmol/L (ref 135–145)
Total Bilirubin: 0.7 mg/dL (ref 0.3–1.2)
Total Protein: 6.3 g/dL — ABNORMAL LOW (ref 6.5–8.1)

## 2015-11-06 LAB — TROPONIN I: Troponin I: <0.03 ng/mL (ref <0.03)

## 2015-11-06 LAB — CBC
HCT: 46.7 % (ref 39.0–52.0)
Hemoglobin: 15.7 g/dL (ref 13.0–17.0)
MCH: 32.9 pg (ref 26.0–34.0)
MCHC: 33.6 g/dL (ref 30.0–36.0)
MCV: 97.9 fL (ref 78.0–100.0)
PLATELETS: 140 10*3/uL — AB (ref 150–400)
RBC: 4.77 MIL/uL (ref 4.22–5.81)
RDW: 14.6 % (ref 11.5–15.5)
WBC: 7.3 10*3/uL (ref 4.0–10.5)

## 2015-11-06 MED ORDER — SODIUM CHLORIDE 0.9% FLUSH
INTRAVENOUS | Status: AC
  Administered 2015-11-06: 07:00:00
  Filled 2015-11-06: qty 20

## 2015-11-06 MED ORDER — TECHNETIUM TC 99M MEBROFENIN IV KIT
5.0000 | PACK | Freq: Once | INTRAVENOUS | Status: AC | PRN
  Administered 2015-11-06: 5.5 via INTRAVENOUS

## 2015-11-06 NOTE — Care Management Obs Status (Signed)
[place] NOTIFICATION   Patient Details  Name: Peter Howe MRN: [id number] Date of Birth: Oct 04, 1940   Medicare Observation Status Notification Given:  Yes    [name], [name], RN 11/06/2015, 11:39 AM

## 2015-11-06 NOTE — Care Management Note (Signed)
Case Management Note  Patient Details  Name: ASAIAH GUMAN MRN: [id number] Date of Birth: 01-13-1941  Subjective/Objective:  Patient adm with CP. Patient is from home alone, ind with ADL's. Family at bedside during assessment. Patient still drives, has PCP, (Dr. Edrick Oh) and has insurance. Reports no issues.                   Action/Plan: Anticipate DC home with self care.    Expected Discharge Date:  11/07/15               Expected Discharge Plan:  Home/Self Care  In-House Referral:  NA  Discharge planning Services  CM Consult  Post Acute Care Choice:  NA Choice offered to:  NA  DME Arranged:    DME Agency:     HH Arranged:    HH Agency:     Status of Service:  Completed, signed off  If discussed at [employer] of Stay Meetings, dates discussed:    Additional Comments:  Guenther Dunshee, [name], RN 11/06/2015, 11:37 AM

## 2015-11-06 NOTE — Discharge Summary (Signed)
Physician Discharge Summary  Peter Howe [id number] DOB: Jul 18, 1940 DOA: 11/05/2015  PCP: Sherrie Mustache, MD  Admit date: 11/05/2015 Discharge date: 11/06/2015  Recommendations for Outpatient Follow-up:  1. Follow-up with PCP as needed   Follow-up Information    Sherrie Mustache, MD Follow up today.   Specialty:  Family Medicine Why:  as needed Contact information: [address] [place]  [address] [phone]          Discharge Diagnoses:  1. Atypical chest pain 2. Abdominal pain 3. Permanent atrial fibrillation  4. HTN 5. Obesity  6. PMH pacemaker, s/p tachy-brady 7. Aortic atherosclerosis.   Discharge Condition: Improved  Disposition: Discharge home.   Diet recommendation: Heart healthy   Filed Weights   11/05/15 [id number] 11/05/15 1829  Weight: 88.5 kg (195 lb) 90.9 kg (200 lb 6.4 oz)    History of present illness:  55 yom with atrial fibrillation presented with indigestion progressing to chest and abdominal pain. Initial evaluation included an unremarkable EKG, negative abdominal ultrasound, unremarkable chest x-ray, nonacute CT abdomen and pelvis. Admitted for further evaluation of chest and abdominal pain. Hospital Course:  Symptoms completely resolve spontaneously. After further discussion, chest pain likely musculoskeletal in nature. Etiology of abdominal pain is unknown. Laboratory studies and imaging were unremarkable. Patient tolerating a diet completely asymptomatic. No further evaluation suggested at this time.  1. Chest/abdominal pain. Completely resolved. Chest pain likely secondary to recently playing golf and reported associated left shoulder, arm and chest pain. Troponins negative. EKG atrial fibrillation, nonacute. Low risk nuclear study 03/2015. In regard to abdominal pain, this is also completely resolved. LFTs and lipase normal. Abdominal US normal. CT abdomen and pelvis unremarkable. HIDA scan normal. Etiology unclear.  Consider reflux. 2. Permanent atrial fibrillation. On Pradaxa. EKG revealed atrial fibrillation with no acute changes.  3. HTN. Stable.  4. Obesity. Noted.  5. PMH pacemaker, s/p tachy-brady 6. Aortic   Consultants:  None   Procedures:  None  Antimicrobials:  None   Discharge Instructions  Discharge Instructions    Activity as tolerated - No restrictions    Complete by:  As directed   Diet general    Complete by:  As directed   Discharge instructions    Complete by:  As directed   Call your physician or seek immediate medical attention for abdominal pain, chest pain, vomiting, shortness of breath or worsening of condition.       Medication List    TAKE these medications   AMBIEN 10 MG tablet Generic drug:  zolpidem Take 5 mg by mouth at bedtime as needed for sleep.   atenolol 100 MG tablet Commonly known as:  TENORMIN Take 100 mg by mouth daily.   losartan 50 MG tablet Commonly known as:  COZAAR Take 50 mg by mouth daily.   multivitamin with minerals Tabs tablet Take 1 tablet by mouth daily.   nitroGLYCERIN 0.4 MG SL tablet Commonly known as:  NITROSTAT Place 1 tablet (0.4 mg total) under the tongue every 5 (five) minutes as needed for chest pain.   PRADAXA 150 MG Caps capsule Generic drug:  dabigatran TAKE 1 CAPSULE BY MOUTH TWICE DAILY      Allergies  Allergen Reactions  . Penicillins Rash    Has patient had a PCN reaction causing immediate rash, facial/tongue/throat swelling, SOB or lightheadedness with hypotension: No Has patient had a PCN reaction causing severe rash involving mucus membranes or skin necrosis: No Has patient had a PCN reaction that required hospitalization No Has  patient had a PCN reaction occurring within the last 10 years: No If all of the above answers are "NO", then may proceed with Cephalosporin use.   . Sulfonamide Derivatives Rash    The results of significant diagnostics from this hospitalization (including imaging,  microbiology, ancillary and laboratory) are listed below for reference.    Significant Diagnostic Studies: Dg Chest 2 View  Result Date: 11/05/2015 CLINICAL DATA:  Central chest pain and upper abdominal pain, onset at 20:00. EXAM: CHEST  2 VIEW COMPARISON:  08/15/2012 FINDINGS: There is unchanged mild cardiomegaly. The lungs are clear except for stable linear scarring in the bases. Hilar and mediastinal contours are unremarkable and unchanged. The pulmonary vasculature is normal. No pleural effusions. There are grossly intact appearances of the transvenous cardiac leads. IMPRESSION: Stable cardiomegaly.  No acute cardiopulmonary findings. Electronically Signed   By: Andreas Newport M.D.   On: 11/05/2015 06:59   Ct Abdomen W Contrast  Result Date: 11/05/2015 CLINICAL DATA:  Upper abdominal pain with nausea and vomiting. EXAM: CT ABDOMEN WITH CONTRAST TECHNIQUE: Multidetector CT imaging of the abdomen was performed using the standard protocol following bolus administration of intravenous contrast. CONTRAST:  123mL ISOVUE-300 IOPAMIDOL (ISOVUE-300) INJECTION 61% COMPARISON:  08/15/2012 CT abdomen/ pelvis. FINDINGS: Lower chest: No significant pulmonary nodules or acute consolidative airspace disease. Three lead pacemaker is noted with lead tips in the right atrium and right ventricular apex. Hepatobiliary: Diffuse hepatic steatosis. No liver mass. Tiny 3 mm radiodensity in the dependent gallbladder likely represents a tiny gallstone. No gallbladder wall thickening or pericholecystic fluid. No biliary ductal dilatation. Stable small periampullary duodenum diverticulum. Pancreas: Normal, with no mass or duct dilation. Spleen: Normal size. No mass. Adrenals/Urinary Tract: Normal adrenals. Normal kidneys with no hydronephrosis and no renal mass. Stomach/Bowel: Grossly normal stomach. Visualized small and large bowel is normal caliber, with no bowel wall thickening. Mild diverticulosis scattered throughout the  visualized colon. Vascular/Lymphatic: Atherosclerotic nonaneurysmal abdominal aorta. Patent portal, splenic, hepatic and renal veins. No pathologically enlarged lymph nodes in the abdomen. Other: No pneumoperitoneum, ascites or focal fluid collection. Musculoskeletal: No aggressive appearing focal osseous lesions. Moderate thoracolumbar spondylosis. IMPRESSION: 1. No acute abnormality mild colonic diverticulosis, with no evidence of acute diverticulitis. 2. Probable cholelithiasis, with no evidence of acute cholecystitis. 3. Additional findings include aortic atherosclerosis and diffuse hepatic steatosis. Electronically Signed   By: Ilona Sorrel M.D.   On: 11/05/2015 09:59   Nm Hepato W/eject Fract  Result Date: 11/06/2015 CLINICAL DATA:  History of abdominal pain EXAM: NUCLEAR MEDICINE HEPATOBILIARY IMAGING WITH GALLBLADDER EF TECHNIQUE: Sequential images of the abdomen were obtained out to 60 minutes following intravenous administration of radiopharmaceutical. After oral ingestion of Ensure, gallbladder ejection fraction was determined. At 60 min, normal ejection fraction is greater than 33%. RADIOPHARMACEUTICALS:  5.5 mCi Tc-69m  Choletec IV COMPARISON:  None. FINDINGS: Prompt uptake and biliary excretion of activity by the liver is seen. Gallbladder activity is visualized, consistent with patency of cystic duct. Biliary activity passes into small bowel, consistent with patent common bile duct. Calculated gallbladder ejection fraction is 80%. (Normal gallbladder ejection fraction with Ensure is greater than 33%.) IMPRESSION: 1. Patent cystic duct without evidence for acute cholecystitis. 2. Normal gallbladder ejection fraction. Electronically Signed   By: Kerby Moors M.D.   On: 11/06/2015 09:53   US Abdomen Limited Ruq  Result Date: 11/05/2015 CLINICAL DATA:  Abdominal pain x1 day. Recent abdomen CT suggested cholelithiasis. EXAM: US ABDOMEN LIMITED - RIGHT UPPER QUADRANT COMPARISON:  CT  11/05/2015  FINDINGS: Gallbladder: No gallstones or wall thickening visualized. No sonographic Murphy sign noted by sonographer. Common bile duct: Diameter: 3 mm, unremarkable Liver: No focal lesion identified. Within normal limits in parenchymal echogenicity. IMPRESSION: Negative.  No gallstones seen. Electronically Signed   By: Lucrezia Europe M.D.   On: 11/05/2015 11:06    Labs: Basic Metabolic Panel:  Recent Labs Lab 11/05/15 0638 11/06/15 0558  NA 139 139  K 4.2 4.3  CL 102 104  CO2 28 29  GLUCOSE 134* 103*  BUN 17 13  CREATININE 1.19 1.02  CALCIUM 9.4 8.8*   Liver Function Tests:  Recent Labs Lab 11/05/15 0638 11/06/15 0558  AST 29 22  ALT 30 22  ALKPHOS 68 53  BILITOT 0.6 0.7  PROT 7.5 6.3*  ALBUMIN 4.7 3.8    Recent Labs Lab 11/05/15 0638  LIPASE 23   CBC:  Recent Labs Lab 11/05/15 0638 11/06/15 0558  WBC 10.6* 7.3  NEUTROABS 8.9*  --   HGB 17.3* 15.7  HCT 50.1 46.7  MCV 97.7 97.9  PLT 162 140*   Cardiac Enzymes:  Recent Labs Lab 11/05/15 0638 11/05/15 1030 11/05/15 1931 11/06/15 0009 11/06/15 0558  TROPONINI <0.03 <0.03 <0.03 <0.03 <0.03    Active Problems:   Hypertension   ATRIAL FIBRILLATION   Chest pain   Abdominal pain   Time coordinating discharge: 35 minutes   Signed:  Murray Hodgkins, MD [facility] 11/06/2015, 4:02 PM  By signing my name below, I, Collene Leyden, attest that this documentation has been prepared under the direction and in the presence of Murray Hodgkins, MD. Electronically signed: Collene Leyden, Scribe. 11/06/15   I personally performed the services described in this documentation. All medical record entries made by the scribe were at my direction. I have reviewed the chart and agree that the record reflects my personal performance and is accurate and complete. Murray Hodgkins, MD

## 2015-11-06 NOTE — Progress Notes (Signed)
PROGRESS NOTE  Peter Howe [id number] DOB: 12-25-1940 DOA: 11/05/2015 PCP: Sherrie Mustache, MD  Brief Narrative: 89 yom with atrial fibrillation presented with indigestion progressing to chest and abdominal pain. Initial evaluation included an unremarkable EKG, negative abdominal ultrasound, unremarkable chest x-ray, nonacute CT abdomen and pelvis. Admitted for further evaluation of chest and abdominal pain.  Assessment/Plan: 1. Chest/abdominal pain. Completely resolved. Chest pain likely secondary to recently playing golf and reported associated left shoulder, arm and chest pain. Troponins negative. EKG atrial fibrillation, nonacute. Low risk nuclear study 03/2015. In regard to abdominal pain, this is also completely resolved. LFTs and lipase normal. Abdominal US normal. CT abdomen and pelvis unremarkable. HIDA scan normal. Etiology unclear. Consider reflux. 2. Permanent atrial fibrillation. On Pradaxa. EKG revealed atrial fibrillation with no acute changes.  3. HTN. Stable.  4. Obesity. Noted.  5. PMH pacemaker, s/p tachy-brady 6. Aortic atherosclerosis   All acute symptoms are resolved. No further evaluation recommended at this time.   Follow up as needed.   DVT prophylaxis: Lovenox Code Status: Full Family Communication: Family bedside Disposition Plan: Discharge home.   Murray Hodgkins, MD  [facility] Direct contact: [phone] --Via amion app OR  --[email or web address]; password [email or web address]  7PM-7AM contact night coverage as above 11/06/2015, 5:45 AM  LOS: 0 days   Consultants:  None   Procedures:  None  Antimicrobials:  None   HPI/Subjective: Feels good. Eating and breathing well. Denies chest pain, nausea, and vomiting. No abdominal pain. No symptoms at this point.  Objective: Vitals:   11/05/15 1730 11/05/15 1800 11/05/15 1829 11/05/15 2123  BP: 138/84 132/96 (!) 160/74 123/70  Pulse:   62 70  Resp: 19 17 20    Temp:   98.2 F (36.8 C) 98.8 F  (37.1 C)  TempSrc:   Oral Oral  SpO2:   97% 99%  Weight:   90.9 kg (200 lb 6.4 oz)   Height:       No intake or output data in the 24 hours ending 11/06/15 0545   Filed Weights   11/05/15 [id number] 11/05/15 1829  Weight: 88.5 kg (195 lb) 90.9 kg (200 lb 6.4 oz)    Exam: Constitutional:  . Appears calm and comfortable Respiratory:  . CTA bilaterally, no w/r/r.  . Respiratory effort normal. No retractions or accessory muscle use Cardiovascular:  . Irregular , no m/r/g . No LE extremity edema   . Telemetry Afib  Abdomen:  . Abdomen appears soft, non tender, and non distended.   I have personally reviewed following labs and imaging studies:  CMP unremarkable   CBC normal.   HIDA scan unremarkable, imaging reviewed.  Scheduled Meds: . aspirin EC  81 mg Oral Daily  . atenolol  100 mg Oral Daily  . enoxaparin (LOVENOX) injection  40 mg Subcutaneous Q24H  . losartan  50 mg Oral Daily  . pantoprazole  40 mg Oral Daily  . sodium chloride flush  3 mL Intravenous Q12H  . sodium chloride flush  3 mL Intravenous Q12H   Continuous Infusions:   Active Problems:   Hypertension   ATRIAL FIBRILLATION   Chest pain   Abdominal pain   LOS: 0 days       By signing my name below, I, Collene Leyden, attest that this documentation has been prepared under the direction and in the presence of Murray Hodgkins, MD. Electronically signed: Collene Leyden, Scribe. 11/06/15    I personally performed the services described in this documentation. All medical record entries made  by the scribe were at my direction. I have reviewed the chart and agree that the record reflects my personal performance and is accurate and complete. Murray Hodgkins, MD

## 2015-11-06 NOTE — Progress Notes (Signed)
Pt being discharged home, vitals stable, no c/o pain, discharge paperwork explained to pt, no questions for the nurse

## 2015-12-09 ENCOUNTER — Encounter: Payer: Self-pay | Admitting: Internal Medicine

## 2015-12-09 DIAGNOSIS — I495 Sick sinus syndrome: Secondary | ICD-10-CM | POA: Diagnosis not present

## 2016-01-08 ENCOUNTER — Other Ambulatory Visit: Payer: Self-pay | Admitting: Gastroenterology

## 2016-02-05 DIAGNOSIS — K219 Gastro-esophageal reflux disease without esophagitis: Secondary | ICD-10-CM | POA: Insufficient documentation

## 2016-03-10 ENCOUNTER — Encounter: Payer: Self-pay | Admitting: Internal Medicine

## 2016-03-25 ENCOUNTER — Emergency Department (HOSPITAL_COMMUNITY): Payer: MEDICARE

## 2016-03-25 ENCOUNTER — Inpatient Hospital Stay (HOSPITAL_COMMUNITY)
Admission: EM | Admit: 2016-03-25 | Discharge: 2016-03-29 | DRG: 390 | Disposition: A | Discharge status: Home / Self Care | Payer: MEDICARE | Attending: Internal Medicine | Admitting: Internal Medicine

## 2016-03-25 ENCOUNTER — Encounter (HOSPITAL_COMMUNITY): Payer: Self-pay

## 2016-03-25 DIAGNOSIS — R9431 Abnormal electrocardiogram [ECG] [EKG]: Secondary | ICD-10-CM

## 2016-03-25 DIAGNOSIS — K5730 Diverticulosis of large intestine without perforation or abscess without bleeding: Secondary | ICD-10-CM | POA: Diagnosis present

## 2016-03-25 DIAGNOSIS — R739 Hyperglycemia, unspecified: Secondary | ICD-10-CM

## 2016-03-25 DIAGNOSIS — K56699 Other intestinal obstruction unspecified as to partial versus complete obstruction: Principal | ICD-10-CM | POA: Diagnosis present

## 2016-03-25 DIAGNOSIS — R112 Nausea with vomiting, unspecified: Secondary | ICD-10-CM | POA: Diagnosis present

## 2016-03-25 DIAGNOSIS — I10 Essential (primary) hypertension: Secondary | ICD-10-CM | POA: Diagnosis present

## 2016-03-25 DIAGNOSIS — Z880 Allergy status to penicillin: Secondary | ICD-10-CM

## 2016-03-25 DIAGNOSIS — Z8249 Family history of ischemic heart disease and other diseases of the circulatory system: Secondary | ICD-10-CM

## 2016-03-25 DIAGNOSIS — Z87891 Personal history of nicotine dependence: Secondary | ICD-10-CM | POA: Diagnosis not present

## 2016-03-25 DIAGNOSIS — Z0189 Encounter for other specified special examinations: Secondary | ICD-10-CM

## 2016-03-25 DIAGNOSIS — Z6835 Body mass index (BMI) 35.0-35.9, adult: Secondary | ICD-10-CM

## 2016-03-25 DIAGNOSIS — Z882 Allergy status to sulfonamides status: Secondary | ICD-10-CM

## 2016-03-25 DIAGNOSIS — Z7902 Long term (current) use of antithrombotics/antiplatelets: Secondary | ICD-10-CM

## 2016-03-25 DIAGNOSIS — I480 Paroxysmal atrial fibrillation: Secondary | ICD-10-CM | POA: Diagnosis not present

## 2016-03-25 DIAGNOSIS — K449 Diaphragmatic hernia without obstruction or gangrene: Secondary | ICD-10-CM | POA: Diagnosis present

## 2016-03-25 DIAGNOSIS — E669 Obesity, unspecified: Secondary | ICD-10-CM | POA: Diagnosis not present

## 2016-03-25 DIAGNOSIS — Z79899 Other long term (current) drug therapy: Secondary | ICD-10-CM | POA: Diagnosis not present

## 2016-03-25 DIAGNOSIS — Z950 Presence of cardiac pacemaker: Secondary | ICD-10-CM | POA: Diagnosis not present

## 2016-03-25 DIAGNOSIS — R140 Abdominal distension (gaseous): Secondary | ICD-10-CM

## 2016-03-25 DIAGNOSIS — R109 Unspecified abdominal pain: Secondary | ICD-10-CM | POA: Diagnosis present

## 2016-03-25 DIAGNOSIS — I4891 Unspecified atrial fibrillation: Secondary | ICD-10-CM | POA: Diagnosis present

## 2016-03-25 DIAGNOSIS — R079 Chest pain, unspecified: Secondary | ICD-10-CM | POA: Diagnosis present

## 2016-03-25 LAB — CBC WITH DIFFERENTIAL/PLATELET
BASOS PCT: 0 %
Basophils Absolute: 0 10*3/uL (ref 0.0–0.1)
EOS ABS: 0 10*3/uL (ref 0.0–0.7)
Eosinophils Relative: 0 %
HEMATOCRIT: 46.7 % (ref 39.0–52.0)
HEMOGLOBIN: 15.8 g/dL (ref 13.0–17.0)
LYMPHS ABS: 1.1 10*3/uL (ref 0.7–4.0)
Lymphocytes Relative: 8 %
MCH: 32.8 pg (ref 26.0–34.0)
MCHC: 33.8 g/dL (ref 30.0–36.0)
MCV: 97.1 fL (ref 78.0–100.0)
MONOS PCT: 6 %
Monocytes Absolute: 0.8 10*3/uL (ref 0.1–1.0)
NEUTROS ABS: 12.1 10*3/uL — AB (ref 1.7–7.7)
NEUTROS PCT: 86 %
Platelets: 146 10*3/uL — ABNORMAL LOW (ref 150–400)
RBC: 4.81 MIL/uL (ref 4.22–5.81)
RDW: 12.6 % (ref 11.5–15.5)
WBC: 14 10*3/uL — AB (ref 4.0–10.5)

## 2016-03-25 LAB — I-STAT TROPONIN, ED: Troponin i, poc: 0 ng/mL (ref 0.00–0.08)

## 2016-03-25 MED ORDER — ONDANSETRON HCL 4 MG/2ML IJ SOLN
4.0000 mg | Freq: Once | INTRAMUSCULAR | Status: AC
  Administered 2016-03-25: 4 mg via INTRAVENOUS
  Filled 2016-03-25: qty 2

## 2016-03-25 MED ORDER — GI COCKTAIL ~~LOC~~
30.0000 mL | Freq: Once | ORAL | Status: AC
  Administered 2016-03-25: 30 mL via ORAL
  Filled 2016-03-25: qty 30

## 2016-03-25 MED ORDER — SODIUM CHLORIDE 0.9 % IV BOLUS (SEPSIS)
1000.0000 mL | Freq: Once | INTRAVENOUS | Status: AC
  Administered 2016-03-25: 1000 mL via INTRAVENOUS

## 2016-03-25 MED ORDER — MORPHINE SULFATE (PF) 4 MG/ML IV SOLN
4.0000 mg | Freq: Once | INTRAVENOUS | Status: AC
  Administered 2016-03-25: 4 mg via INTRAVENOUS
  Filled 2016-03-25: qty 1

## 2016-03-25 MED ORDER — PANTOPRAZOLE SODIUM 40 MG IV SOLR
40.0000 mg | Freq: Once | INTRAVENOUS | Status: AC
  Administered 2016-03-25: 40 mg via INTRAVENOUS
  Filled 2016-03-25: qty 40

## 2016-03-25 NOTE — ED Provider Notes (Signed)
TIME SEEN: 11:20 PM  CHIEF COMPLAINT: Chest pain, abdominal pain  HPI: Pt is a 76 y.o. male with history of hypertension, obesity, paroxysmal atrial fibrillation on Pradaxa, tachycardia/bradycardia syndrome status post pacemaker, hiatal hernia who presents emergency department with upper abdominal pain and chest pain that radiates into his neck that started 4 hours ago while at rest. Described as sharp, constant, severe. No known aggravating or relieving factors. Was given 2 nitroglycerin glycerin with EMS and took one nitroglycerin at home without relief. Denies shortness of breath, dizziness, diaphoresis. States he did feel nauseated and had one episode of emesis and states that he noticed that it was blood streaks. Denies any recent bloody stools or melena but states she has been followed by his PCP closely as he has had Hemoccult-positive stools. He states he has had a colonoscopy and endoscopy years ago. He denies any bitter, sour taste in his mouth. Denies that this is related with food. States that he did not eat much today, only popcorn earlier this afternoon. He denies fevers, cough, diarrhea.  No h/o abdominal surgeries.  States he has been taking a PPI for the last several weeks and took it today.   PCP - Dr. Edrick Oh Cardiologist - Dr. Rayann Heman   Stress test 04/02/15:  No diagnostic ST segment changes to indicate ischemia with intermittent ventricular pacing on baseline atrial fibrillation.  Mid to apical, inferior and inferoseptal defect that is small and mild intensity, fixed and consistent with diaphragmatic attenuation. No clear evidence of ischemia.  This is a low risk study.  Nuclear stress EF: 65%    ROS: See HPI Constitutional: no fever  Eyes: no drainage  ENT: no runny nose   Cardiovascular:   chest pain  Resp: no SOB  GI:  vomiting GU: no dysuria Integumentary: no rash  Allergy: no hives  Musculoskeletal: no leg swelling  Neurological: no slurred speech ROS  otherwise negative  PAST MEDICAL HISTORY/PAST SURGICAL HISTORY:  Past Medical History:  Diagnosis Date  . Hypertension   . Overweight(278.02)    obesity  . Permanent atrial fibrillation ([place])   . Tachycardia-bradycardia [facility])    s/p PPM    MEDICATIONS:  Prior to Admission medications   Medication Sig Start Date End Date Taking? Authorizing Provider  atenolol (TENORMIN) 100 MG tablet Take 100 mg by mouth daily.      Historical Provider, MD  losartan (COZAAR) 50 MG tablet Take 50 mg by mouth daily.    Historical Provider, MD  Multiple Vitamin (MULTIVITAMIN WITH MINERALS) TABS tablet Take 1 tablet by mouth daily.    Historical Provider, MD  nitroGLYCERIN (NITROSTAT) 0.4 MG SL tablet Place 1 tablet (0.4 mg total) under the tongue every 5 (five) minutes as needed for chest pain. 03/26/15   Herminio Commons, MD  PRADAXA 150 MG CAPS capsule TAKE 1 CAPSULE BY MOUTH TWICE DAILY 07/22/15   Herminio Commons, MD  zolpidem (AMBIEN) 10 MG tablet Take 5 mg by mouth at bedtime as needed for sleep.     Historical Provider, MD    ALLERGIES:  Allergies  Allergen Reactions  . Penicillins Rash    Has patient had a PCN reaction causing immediate rash, facial/tongue/throat swelling, SOB or lightheadedness with hypotension: No Has patient had a PCN reaction causing severe rash involving mucus membranes or skin necrosis: No Has patient had a PCN reaction that required hospitalization No Has patient had a PCN reaction occurring within the last 10 years: No If all of the above  answers are "NO", then may proceed with Cephalosporin use.   . Sulfonamide Derivatives Rash    SOCIAL HISTORY:  Social History  Substance Use Topics  . Smoking status: Former Smoker    Packs/day: 1.00    Years: 30.00    Types: Cigarettes    Start date: 03/30/1955    Quit date: 03/09/1993  . Smokeless tobacco: Never Used  . Alcohol use 0.0 oz/week    FAMILY HISTORY: No family history on file.  EXAM: BP 160/92 (BP  Location: Left Arm)   Pulse 73   Temp 98 F (36.7 C) (Oral)   Resp 18   SpO2 97%  CONSTITUTIONAL: Alert and oriented and responds appropriately to questions. Elderly, chronically ill-appearing, appears uncomfortable HEAD: Normocephalic EYES: Conjunctivae clear, PERRL, EOMI ENT: normal nose; no rhinorrhea; dry mucous membranes NECK: Supple, no meningismus, no nuchal rigidity, no LAD  CARD: RRR; S1 and S2 appreciated; no murmurs, no clicks, no rubs, no gallops RESP: Normal chest excursion without splinting or tachypnea; breath sounds clear and equal bilaterally; no wheezes, no rhonchi, no rales, no hypoxia or respiratory distress, speaking full sentences ABD/GI: Normal bowel sounds; non-distended; soft, tender throughout the entire abdomen especially in the epigastric region, right upper quadrant and left upper quadrant with intermittent voluntary guarding, no rebound, no involuntary guarding, no peritoneal signs, no hepatosplenomegaly BACK:  The back appears normal and is non-tender to palpation, there is no CVA tenderness EXT: Normal ROM in all joints; non-tender to palpation; no edema; normal capillary refill; no cyanosis, no calf tenderness or swelling, 2+ radial pulses bilaterally    SKIN: Normal color for age and race; warm; no rash NEURO: Moves all extremities equally, sensation to light touch intact diffusely, cranial nerves II through XII intact, normal speech PSYCH: The patient's mood and manner are appropriate. Grooming and personal hygiene are appropriate.  MEDICAL DECISION MAKING: Patient with abdominal pain that radiates into his chest. Differential diagnosis includes gastritis, peptic ulcer disease, cholecystitis or cholelithiasis, pancreatitis, ACS, dissection. EKG shows atrial fibrillation which patient has a history of what appears to be new T-wave inversions in inferior lateral leads. Patient was admitted to the hospital in August for similar symptoms but states today is more  severe. That time had negative serial troponins, negative CT abdomen and pelvis, negative right upper ultrasound, negative HIDA scan. Pain was thought to be musculoskeletal. Stress test was in January 2017 and was low risk. Will obtain labs, urine, CT of his chest, abdomen and pelvis for further evaluation. Will give morphine, Zofran and IV fluids.  ED PROGRESS: 12:10 AM  Pt still moaning in pain. Labs show leukocytosis with left shift. Troponin negative and this is after 4 hours of consistent pain. I feel like this is less likely ACS. He denies any improvement in pain after morphine, Protonix, GI cocktail. We'll give IV Dilaudid.   12:50 AM  Pt continues to have pain. Blood pressure is rising. We'll give IV hydralazine. CMP, lipase hemolyzed and had to be redrawn. We'll obtain upright, portable chest and abdominal films to evaluate for widened mediastinum, free air while we are waiting his creatinine before CT scan.  1:05 AM  Pt's chest x-ray shows widened mediastinum that appears baseline for patient. No free air. No sign of bowel obstruction. No infiltrate or edema.  On review of patient's CT scan in August 2017, patient had atherosclerosis of his aorta but no aneurysm.  Blood pressure has improved after hydralazine is in the 170s to 180s/80s to 90s. Reports  he is on atenolol and another blood pressure medication which she cannot recall the name -  losartan per records. States he has been taking them regularly. States that this is much higher than his blood pressure normally runs. Discussed with him that this could be because of pain or could this could be what is causing his pain. We'll run an i-STAT Chem-8 to obtain a creatinine immediately so that we can taken for CT scan to rule out dissection.  Pain is still a 7/10.  Will give second round of IV dilaudid.  1:15 AM  Pt's Cr, LFTs, lipase are normal.  Will take over to CT.  1:35 AM  Pt's pain is now a 3/10 in his blood pressure is 170/79. Patient  had received 3 mg of IV Dilaudid, 4 mg of IV morphine to get his pain controlled but now he is mildly hypoxic after all these narcotics. Placed on 2 L nasal cannula. I have reviewed his CT imaging do not see sign of dissection or aneurysm. Awaiting official read.   2:15 AM  Pt's CT scan shows no acute abnormality other than very mild pulmonary edema. BNP 252. We'll hold further IV hydration. Unclear etiology for patient's pain. I do feel this is atypical for ACS but he does have multiple risk factors (age, obesity, HTN, previous tobacco use), appeared to have new T-wave inversions on EKG and given he required multiple rounds of narcotics for pain control I feel it would be reasonable to admit him for observation for a chest pain rule out. Patient is on is completely pain-free at this time. Discussed with patient this could've also been hypertensive urgency causing his symptoms. Blood pressure is now 153/94. Will discuss with hospitalist.   2:30 AM  Discussed patient's case with hospitalist, Dr. Lorin Mercy.  Recommend admission to telemetry, observation bed.  I will place holding orders per their request. Patient and family (if present) updated with plan. Care transferred to hospitalist service.  I reviewed all nursing notes, vitals, pertinent old records, EKGs, labs, imaging (as available).         EKG Interpretation  Date/Time:  Wednesday March 25 2016 23:10:55 EST Ventricular Rate:  73 PR Interval:    QRS Duration: 92 QT Interval:  436 QTC Calculation: 484 R Axis:   24 Text Interpretation:  Afib/flut and V-paced complexes No further rhythm analysis attempted due to paced rhythm Borderline repolarization abnormality Borderline prolonged QT interval Baseline wander in lead(s) V1 New appearing T wave inversions in inferolateral leads Confirmed by Khara Renaud,  DO, Cynthia Cogle [phone]) on 03/25/2016 11:13:45 PM        EKG Interpretation  Date/Time:  Thursday March 26 2016 02:26:32 EST Ventricular  Rate:  87 PR Interval:    QRS Duration: 91 QT Interval:  357 QTC Calculation: 430 R Axis:   39 Text Interpretation:  Atrial fibrillation Nonspecific repol abnormality, diffuse leads Artifact in lead(s) I II III aVR aVL aVF V2 V3 V4 V5 V6 and baseline wander in lead(s) V3 V5 Confirmed by Anzley Dibbern,  DO, Soumya Colson [id number]) on 03/26/2016 2:28:34 AM        CRITICAL CARE Performed by: Nyra Jabs   Total critical care time: 45 minutes  Critical care time was exclusive of separately billable procedures and treating other patients.  Critical care was necessary to treat or prevent imminent or life-threatening deterioration.  Critical care was time spent personally by me on the following activities: development of treatment plan with patient and/or surrogate as well as  nursing, discussions with consultants, evaluation of patient's response to treatment, examination of patient, obtaining history from patient or surrogate, ordering and performing treatments and interventions, ordering and review of laboratory studies, ordering and review of radiographic studies, pulse oximetry and re-evaluation of patient's condition.    [place], DO 03/26/16 [phone]

## 2016-03-25 NOTE — ED Triage Notes (Signed)
Pt in by ems for chest pain that started approx 3 hours ago.  Pt took his own ntg sl x 1 at home without relief and given 2 ntg sl by ems without relief.  Pt states he vomited at home with some blood in it so was not given asa by ems.  Pt reports pain from upper abd into upper chest and neck.  Pt ha demand pacemaker.

## 2016-03-26 ENCOUNTER — Emergency Department (HOSPITAL_COMMUNITY): Payer: MEDICARE

## 2016-03-26 ENCOUNTER — Observation Stay (HOSPITAL_COMMUNITY): Payer: MEDICARE

## 2016-03-26 ENCOUNTER — Encounter (HOSPITAL_COMMUNITY): Payer: Self-pay | Admitting: Internal Medicine

## 2016-03-26 ENCOUNTER — Observation Stay (HOSPITAL_BASED_OUTPATIENT_CLINIC_OR_DEPARTMENT_OTHER): Payer: MEDICARE

## 2016-03-26 DIAGNOSIS — R079 Chest pain, unspecified: Secondary | ICD-10-CM

## 2016-03-26 DIAGNOSIS — I10 Essential (primary) hypertension: Secondary | ICD-10-CM | POA: Diagnosis not present

## 2016-03-26 DIAGNOSIS — I480 Paroxysmal atrial fibrillation: Secondary | ICD-10-CM | POA: Diagnosis not present

## 2016-03-26 DIAGNOSIS — R9431 Abnormal electrocardiogram [ECG] [EKG]: Secondary | ICD-10-CM | POA: Diagnosis not present

## 2016-03-26 DIAGNOSIS — Z950 Presence of cardiac pacemaker: Secondary | ICD-10-CM | POA: Diagnosis not present

## 2016-03-26 DIAGNOSIS — Z882 Allergy status to sulfonamides status: Secondary | ICD-10-CM | POA: Diagnosis not present

## 2016-03-26 DIAGNOSIS — Z6835 Body mass index (BMI) 35.0-35.9, adult: Secondary | ICD-10-CM | POA: Diagnosis not present

## 2016-03-26 DIAGNOSIS — Z79899 Other long term (current) drug therapy: Secondary | ICD-10-CM | POA: Diagnosis not present

## 2016-03-26 DIAGNOSIS — Z880 Allergy status to penicillin: Secondary | ICD-10-CM | POA: Diagnosis not present

## 2016-03-26 DIAGNOSIS — I482 Chronic atrial fibrillation: Secondary | ICD-10-CM

## 2016-03-26 DIAGNOSIS — R1010 Upper abdominal pain, unspecified: Secondary | ICD-10-CM

## 2016-03-26 DIAGNOSIS — E669 Obesity, unspecified: Secondary | ICD-10-CM | POA: Diagnosis not present

## 2016-03-26 DIAGNOSIS — R1013 Epigastric pain: Secondary | ICD-10-CM | POA: Diagnosis not present

## 2016-03-26 DIAGNOSIS — K449 Diaphragmatic hernia without obstruction or gangrene: Secondary | ICD-10-CM | POA: Diagnosis not present

## 2016-03-26 DIAGNOSIS — R739 Hyperglycemia, unspecified: Secondary | ICD-10-CM

## 2016-03-26 DIAGNOSIS — Z7902 Long term (current) use of antithrombotics/antiplatelets: Secondary | ICD-10-CM | POA: Diagnosis not present

## 2016-03-26 DIAGNOSIS — K5730 Diverticulosis of large intestine without perforation or abscess without bleeding: Secondary | ICD-10-CM | POA: Diagnosis not present

## 2016-03-26 DIAGNOSIS — R112 Nausea with vomiting, unspecified: Secondary | ICD-10-CM

## 2016-03-26 DIAGNOSIS — K56699 Other intestinal obstruction unspecified as to partial versus complete obstruction: Secondary | ICD-10-CM | POA: Diagnosis present

## 2016-03-26 DIAGNOSIS — Z87891 Personal history of nicotine dependence: Secondary | ICD-10-CM | POA: Diagnosis not present

## 2016-03-26 DIAGNOSIS — Z8249 Family history of ischemic heart disease and other diseases of the circulatory system: Secondary | ICD-10-CM | POA: Diagnosis not present

## 2016-03-26 LAB — LIPID PANEL
CHOL/HDL RATIO: 2 ratio
Cholesterol: 202 mg/dL — ABNORMAL HIGH (ref 0–200)
HDL: 100 mg/dL (ref >40)
LDL CALC: 93 mg/dL (ref 0–99)
Triglycerides: 47 mg/dL (ref <150)
VLDL: 9 mg/dL (ref 0–40)

## 2016-03-26 LAB — COMPREHENSIVE METABOLIC PANEL
ALBUMIN: 4.3 g/dL (ref 3.5–5.0)
ALK PHOS: 55 U/L (ref 38–126)
ALT: 31 U/L (ref 17–63)
AST: 33 U/L (ref 15–41)
Anion gap: 14 (ref 5–15)
BILIRUBIN TOTAL: 0.8 mg/dL (ref 0.3–1.2)
BUN: 15 mg/dL (ref 6–20)
CALCIUM: 9.1 mg/dL (ref 8.9–10.3)
CO2: 26 mmol/L (ref 22–32)
CREATININE: 1.09 mg/dL (ref 0.61–1.24)
Chloride: 98 mmol/L — ABNORMAL LOW (ref 101–111)
GFR calc Af Amer: >60 mL/min (ref >60)
GFR calc non Af Amer: >60 mL/min (ref >60)
GLUCOSE: 131 mg/dL — AB (ref 65–99)
Potassium: 4 mmol/L (ref 3.5–5.1)
SODIUM: 138 mmol/L (ref 135–145)
TOTAL PROTEIN: 7 g/dL (ref 6.5–8.1)

## 2016-03-26 LAB — I-STAT CHEM 8, ED
BUN: 14 mg/dL (ref 6–20)
CALCIUM ION: 1.11 mmol/L — AB (ref 1.15–1.40)
CHLORIDE: 101 mmol/L (ref 101–111)
Creatinine, Ser: 1.1 mg/dL (ref 0.61–1.24)
GLUCOSE: 125 mg/dL — AB (ref 65–99)
HCT: 47 % (ref 39.0–52.0)
Hemoglobin: 16 g/dL (ref 13.0–17.0)
Potassium: 3.9 mmol/L (ref 3.5–5.1)
Sodium: 139 mmol/L (ref 135–145)
TCO2: 25 mmol/L (ref 0–100)

## 2016-03-26 LAB — URINALYSIS, ROUTINE W REFLEX MICROSCOPIC
BACTERIA UA: NONE SEEN
Bilirubin Urine: NEGATIVE
Glucose, UA: 50 mg/dL — AB
Ketones, ur: 5 mg/dL — AB
LEUKOCYTES UA: NEGATIVE
Nitrite: NEGATIVE
PROTEIN: 30 mg/dL — AB
SPECIFIC GRAVITY, URINE: 1.023 (ref 1.005–1.030)
pH: 7 (ref 5.0–8.0)

## 2016-03-26 LAB — RAPID URINE DRUG SCREEN, HOSP PERFORMED
Amphetamines: NOT DETECTED
BENZODIAZEPINES: NOT DETECTED
Barbiturates: NOT DETECTED
Cocaine: NOT DETECTED
OPIATES: POSITIVE — AB
Tetrahydrocannabinol: NOT DETECTED

## 2016-03-26 LAB — TROPONIN I: Troponin I: <0.03 ng/mL (ref <0.03)

## 2016-03-26 LAB — ECHOCARDIOGRAM COMPLETE
HEIGHTINCHES: 64 in
WEIGHTICAEL: 3276.92 [oz_av]

## 2016-03-26 LAB — TSH: TSH: 1.306 u[IU]/mL (ref 0.350–4.500)

## 2016-03-26 LAB — BRAIN NATRIURETIC PEPTIDE: B Natriuretic Peptide: 252 pg/mL — ABNORMAL HIGH (ref 0.0–100.0)

## 2016-03-26 LAB — LIPASE, BLOOD: Lipase: 23 U/L (ref 11–51)

## 2016-03-26 MED ORDER — DEXTROSE-NACL 5-0.9 % IV SOLN
INTRAVENOUS | Status: DC
  Administered 2016-03-26 – 2016-03-29 (×4): via INTRAVENOUS

## 2016-03-26 MED ORDER — GI COCKTAIL ~~LOC~~: 30.0000 mL | Freq: Four times a day (QID) | ORAL | Status: DC | PRN

## 2016-03-26 MED ORDER — DABIGATRAN ETEXILATE MESYLATE 150 MG PO CAPS
150.0000 mg | ORAL_CAPSULE | Freq: Two times a day (BID) | ORAL | Status: DC
  Administered 2016-03-26 – 2016-03-27 (×2): 150 mg via ORAL
  Filled 2016-03-26 (×9): qty 1

## 2016-03-26 MED ORDER — SIMETHICONE 40 MG/0.6ML PO SUSP
80.0000 mg | Freq: Four times a day (QID) | ORAL | Status: DC
  Administered 2016-03-26 – 2016-03-29 (×11): 80 mg via ORAL
  Filled 2016-03-26: qty 1.2

## 2016-03-26 MED ORDER — HYDRALAZINE HCL 20 MG/ML IJ SOLN: 5.0000 mg | INTRAMUSCULAR | Status: DC | PRN

## 2016-03-26 MED ORDER — ATENOLOL 25 MG PO TABS
100.0000 mg | ORAL_TABLET | Freq: Every day | ORAL | Status: DC
  Administered 2016-03-26 – 2016-03-29 (×4): 100 mg via ORAL
  Filled 2016-03-26 (×4): qty 4

## 2016-03-26 MED ORDER — HYDROMORPHONE HCL 1 MG/ML IJ SOLN
1.0000 mg | Freq: Once | INTRAMUSCULAR | Status: AC
  Administered 2016-03-26: 1 mg via INTRAVENOUS
  Filled 2016-03-26: qty 1

## 2016-03-26 MED ORDER — HYDROMORPHONE HCL 1 MG/ML IJ SOLN
0.5000 mg | INTRAMUSCULAR | Status: DC | PRN
  Administered 2016-03-26 – 2016-03-29 (×16): 0.5 mg via INTRAVENOUS
  Filled 2016-03-26 (×18): qty 1

## 2016-03-26 MED ORDER — NITROGLYCERIN 0.4 MG SL SUBL
0.4000 mg | SUBLINGUAL_TABLET | SUBLINGUAL | Status: DC | PRN
Start: 2016-03-26 — End: 2016-03-29

## 2016-03-26 MED ORDER — ONDANSETRON HCL 4 MG/2ML IJ SOLN
4.0000 mg | Freq: Four times a day (QID) | INTRAMUSCULAR | Status: DC | PRN
  Administered 2016-03-26 (×2): 4 mg via INTRAVENOUS
  Filled 2016-03-26 (×2): qty 2

## 2016-03-26 MED ORDER — ACETAMINOPHEN 325 MG PO TABS: 650.0000 mg | ORAL_TABLET | ORAL | Status: DC | PRN

## 2016-03-26 MED ORDER — LOSARTAN POTASSIUM 50 MG PO TABS
50.0000 mg | ORAL_TABLET | Freq: Every day | ORAL | Status: DC
  Administered 2016-03-26 – 2016-03-29 (×4): 50 mg via ORAL
  Filled 2016-03-26 (×4): qty 1

## 2016-03-26 MED ORDER — ASPIRIN EC 325 MG PO TBEC
325.0000 mg | DELAYED_RELEASE_TABLET | Freq: Every day | ORAL | Status: DC
  Administered 2016-03-28 – 2016-03-29 (×2): 325 mg via ORAL
  Filled 2016-03-26 (×4): qty 1

## 2016-03-26 MED ORDER — HYDRALAZINE HCL 20 MG/ML IJ SOLN
10.0000 mg | Freq: Once | INTRAMUSCULAR | Status: AC
  Administered 2016-03-26: 10 mg via INTRAVENOUS
  Filled 2016-03-26: qty 1

## 2016-03-26 MED ORDER — ZOLPIDEM TARTRATE 5 MG PO TABS
5.0000 mg | ORAL_TABLET | Freq: Every evening | ORAL | Status: DC | PRN
  Administered 2016-03-27 – 2016-03-28 (×2): 5 mg via ORAL
  Filled 2016-03-26 (×2): qty 1

## 2016-03-26 MED ORDER — PANTOPRAZOLE SODIUM 40 MG PO TBEC
80.0000 mg | DELAYED_RELEASE_TABLET | Freq: Two times a day (BID) | ORAL | Status: DC
  Administered 2016-03-26 – 2016-03-29 (×8): 80 mg via ORAL
  Filled 2016-03-26 (×9): qty 2

## 2016-03-26 MED ORDER — MORPHINE SULFATE (PF) 2 MG/ML IV SOLN
2.0000 mg | INTRAVENOUS | Status: DC | PRN
  Administered 2016-03-26: 2 mg via INTRAVENOUS
  Filled 2016-03-26: qty 1

## 2016-03-26 MED ORDER — IOPAMIDOL (ISOVUE-370) INJECTION 76%
100.0000 mL | Freq: Once | INTRAVENOUS | Status: AC | PRN
  Administered 2016-03-26: 100 mL via INTRAVENOUS

## 2016-03-26 NOTE — Consult Note (Signed)
Referring Provider: [facility] Primary Care Physician:  Sherrie Mustache, MD Primary Gastroenterologist:  Teena Irani, MD (2006)  Date of Admission: 03/25/16 Date of Consultation: 03/26/16  Reason for Consultation:  Abdominal pain  HPI:  Peter Howe is a 76 y.o. 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 ER and hospitalist notes reviewed. The patient presented to the emergency department on 03/26/2015 complaining of abdominal pain that radiated into his chest. He was admitted with similar symptoms in August 2017 deemed to be musculoskeletal in nature. However, although symptoms are similar to his previous hospitalization they are worse currently and associated with some nausea, a few episodes of vomiting with blood streaks, blood in stool (per patient) and due to give another stool specimen with primary care next week. States he saw Dr. Amedeo Plenty in November and was started on Prilosec and noted improvement until the day of admission. Noted hypertension and palpitations with blood pressure 202/101. He was admitted for further evaluation.  EKG performed showed atrial fibrillation, troponins negative 3. Chest x-ray with minor pleural effusion. Abdominal x-ray today approximately 1 AM showed no evidence of free intra-abdominal air, mild gaseous gastric distention without bowel obstruction. CT angiogram of the chest, abdomen, and pelvis completed around the same time found no aortic dissection or abnormality, minimal pulmonary edema without acute chest findings, no acute abnormality in the abdomen or pelvis, colonic diverticulosis without diverticulitis, hepatic steatosis. Repeat abdominal x-ray at approximately 2:00 this afternoon showed persistent distended small bowel loops noted to be disproportionate retrospect: Gas and developing small bowel  obstruction is not excluded, recommended close follow-up.  Besides troponins, as noted above, other labs include CMP essentially normal, lipase normal, BNP mildly elevated at 252, CBC yesterday with hemoglobin/hematocrit of 15.8/46.7, white blood cell count 14.0.  Today he confirms the above information. States he was sitting down watching tv when his symptoms began. Had not even for approximately 2 hours. No recent food changes. No on NSAIDs or ASA powders (told not to by cardiology due to Pradaxa). Prilosec initially helped. Has had several episodes of nausea associated with his pain. Vomited x 5, saw small streaks of blood from the first episode of emesis. Denies hematochezia. Has noted intermittent episodes of loose, dark stools of which the last was approximately 1 month ago. Pain is pan-upper abdomen and epigastric with radiation into the chest. No significant changes in stools. Has had increased gas with prilosec. Denies shortness of breath and syncope/near syncope. No other GI complaints.  Past Medical History:  Diagnosis Date  . Hypertension   . Overweight(278.02)    obesity  . Permanent atrial fibrillation ([place])   . Tachycardia-bradycardia [facility])    s/p PPM    Past Surgical History:  Procedure Laterality Date  . PACEMAKER INSERTION  03/28/03   [employer]-PPM    Prior to Admission medications   Medication Sig Start Date End Date Taking? Authorizing Provider  atenolol (TENORMIN) 100 MG tablet Take 100 mg by mouth daily.     Yes Historical Provider, MD  losartan (COZAAR) 50 MG tablet Take 50 mg by mouth daily.   Yes Historical Provider, MD  Multiple Vitamin (MULTIVITAMIN WITH MINERALS) TABS tablet Take 1 tablet by mouth daily.   Yes Historical Provider, MD  nitroGLYCERIN (NITROSTAT) 0.4 MG SL tablet Place 1 tablet (0.4 mg total) under the tongue every 5 (five) minutes as needed for chest pain. 03/26/15  Yes Jamesetta So  Blanchard Mane, MD  omeprazole (PRILOSEC) 40 MG capsule Take 40 mg by mouth  daily.   Yes Historical Provider, MD  PRADAXA 150 MG CAPS capsule TAKE 1 CAPSULE BY MOUTH TWICE DAILY 07/22/15  Yes Herminio Commons, MD  Probiotic Product (ALIGN PO) Take 1 capsule by mouth daily.   Yes Historical Provider, MD  zolpidem (AMBIEN) 10 MG tablet Take 5 mg by mouth at bedtime as needed for sleep.    Yes Historical Provider, MD    Current Facility-Administered Medications  Medication Dose Route Frequency Provider Last Rate Last Dose  . acetaminophen (TYLENOL) tablet 650 mg  650 mg Oral Q4H PRN Karmen Bongo, MD      . aspirin EC tablet 325 mg  325 mg Oral Daily Karmen Bongo, MD      . atenolol (TENORMIN) tablet 100 mg  100 mg Oral Daily Karmen Bongo, MD   100 mg at 03/26/16 0839  . dabigatran (PRADAXA) capsule 150 mg  150 mg Oral BID Karmen Bongo, MD   150 mg at 03/26/16 [id number]  . dextrose 5 %-0.9 % sodium chloride infusion   Intravenous Continuous Orvan Falconer, MD      . gi cocktail (Maalox,Lidocaine,Donnatal)  30 mL Oral QID PRN Karmen Bongo, MD      . hydrALAZINE (APRESOLINE) injection 5 mg  5 mg Intravenous Q4H PRN Karmen Bongo, MD      . HYDROmorphone (DILAUDID) injection 0.5 mg  0.5 mg Intravenous Q3H PRN Orvan Falconer, MD   0.5 mg at 03/26/16 1459  . losartan (COZAAR) tablet 50 mg  50 mg Oral Daily Karmen Bongo, MD   50 mg at 03/26/16 [phone]  . nitroGLYCERIN (NITROSTAT) SL tablet 0.4 mg  0.4 mg Sublingual Q5 min PRN Karmen Bongo, MD      . ondansetron [facility]) injection 4 mg  4 mg Intravenous Q6H PRN Karmen Bongo, MD   4 mg at 03/26/16 1135  . pantoprazole (PROTONIX) EC tablet 80 mg  80 mg Oral BID Karmen Bongo, MD   80 mg at 03/26/16 [id number]  . simethicone (MYLICON) 40 [id number] suspension 80 mg  80 mg Oral QID Orvan Falconer, MD   80 mg at 03/26/16 1519  . zolpidem (AMBIEN) tablet 5 mg  5 mg Oral QHS PRN Karmen Bongo, MD        Allergies as of 03/25/2016 - Review Complete 03/25/2016  Allergen Reaction Noted  . Penicillins Rash   . Sulfonamide derivatives Rash  11/08/2009    Family History  Problem Relation Age of Onset  . Cancer Mother 49  . CAD Father 87    Social History   Social History  . Marital status: Widowed    Spouse name: N/A  . Number of children: N/A  . Years of education: N/A   Occupational History  . runs a furniture store     [place]  . Smoking status: Former Smoker    Packs/day: 1.00    Years: 30.00    Types: Cigarettes    Start date: 03/30/1955    Quit date: 03/09/1993  . Smokeless tobacco: Never Used  . Alcohol use 0.0 oz/week     Comment: 0-3 beers daily with rare excess  . Drug use: No  . Sexual activity: Not on file   Other Topics Concern  . Not on file   Social History Narrative   Owns a Engineer, technical sales, enjoys golf   Lives in [place] of  Systems: General: Negative for anorexia, weight loss, fever, chills, fatigue, weakness. ENT: Negative for hoarseness, nasal congestion. CV: Negative for chest pain, angina, palpitations, peripheral edema.  Respiratory: Negative for dyspnea at rest, cough, sputum, wheezing.  GI: See history of present illness. GU:  Negative for dysuria, hematuria, urinary incontinence, urinary frequency, nocturnal urination.  MS: Negative for joint pain, low back pain.  Derm: Negative for rash or itching.  Neuro: Negative for memory loss, confusion.  Endo: Negative for unusual weight change.  Heme: Negative for bruising or bleeding.  Physical Exam: Vital signs in last 24 hours: Temp:  [97.4 F (36.3 C)-98 F (36.7 C)] 98 F (36.7 C) (01/18 0821) Pulse Rate:  [54-128] 128 (01/18 0821) Resp:  [16-20] 18 (01/18 0821) BP: (153-202)/(80-108) 165/83 (01/18 0821) SpO2:  [82 %-100 %] 99 % (01/18 0821) Weight:  [204 lb 12.9 oz (92.9 kg)] 204 lb 12.9 oz (92.9 kg) (01/18 0341) Last BM Date: 03/25/16 General:   Alert,  Well-developed, well-nourished, pleasant and cooperative in NAD. Appears comfortable at this time. Head:  Normocephalic and  atraumatic. Eyes:  Sclera clear, no icterus. Conjunctiva pink. Ears:  Normal auditory acuity. Neck:  Supple; no masses or thyromegaly. Lungs:  Clear throughout to auscultation. No wheezes, crackles, or rhonchi. No acute distress. Heart:  Regular rate and rhythm; no murmurs, clicks, rubs, or gallops. Abdomen:  Soft, and nondistended. Mild to moderate abdominal tenderness to palpation, worse upper abdomen/epigastric. No masses, hepatosplenomegaly or hernias noted. Normal bowel sounds, without guarding, and without rebound.   Rectal:  Deferred.   Msk:  Symmetrical without gross deformities. Pulses:  Normal bilateral DP pulses noted. Extremities:  Without clubbing or edema. Neurologic:  Alert and  oriented x4;  grossly normal neurologically. Psych:  Alert and cooperative. Normal mood and affect.  Intake/Output from previous day: 01/17 0701 - 01/18 0700 In: 1000 [IV Piggyback:1000] Out: -  Intake/Output this shift: Total I/O In: -  Out: 400 [Urine:400]  Lab Results:  Recent Labs  03/25/16 2325 03/26/16 0113  WBC 14.0*  --   HGB 15.8 16.0  HCT 46.7 47.0  PLT 146*  --    BMET  Recent Labs  03/26/16 0031 03/26/16 0113  NA 138 139  K 4.0 3.9  CL 98* 101  CO2 26  --   GLUCOSE 131* 125*  BUN 15 14  CREATININE 1.09 1.10  CALCIUM 9.1  --    LFT  Recent Labs  03/26/16 0031  PROT 7.0  ALBUMIN 4.3  AST 33  ALT 31  ALKPHOS 55  BILITOT 0.8   PT/INR No results for input(s): LABPROT, INR in the last 72 hours. Hepatitis Panel No results for input(s): HEPBSAG, HCVAB, HEPAIGM, HEPBIGM in the last 72 hours. C-Diff No results for input(s): CDIFFTOX in the last 72 hours.  Studies/Results: Dg Abd 1 View  Result Date: 03/26/2016 CLINICAL DATA:  Upper abdominal pain. EXAM: ABDOMEN - 1 VIEW COMPARISON:  03/26/2016 0052 hours FINDINGS: Distended loops of small bowel are present in the right hemiabdomen. There is some colonic gas. The stomach is gas-filled but nondistended.  There is no obvious free intraperitoneal gas. No pneumatosis. There is contrast within the bladder. IMPRESSION: Persistent distended small bowel loops. They or disproportionate with respect to colonic gas. Developing small bowel obstruction is not excluded. Close follow-up is warranted. Electronically Signed   By: Marybelle Killings M.D.   On: 03/26/2016 14:36   Dg Chest Portable 1 View  Result Date: 03/26/2016 CLINICAL DATA:  Sudden onset  of chest and upper abdominal pain. Vomiting. EXAM: PORTABLE CHEST 1 VIEW COMPARISON:  Radiographs 11/05/2015 FINDINGS: Multi lead right-sided pacemaker remains in place. Cardiomegaly with tortuous thoracic aorta is stable. Indistinct pulmonary vasculature raises concern for pulmonary edema. No pleural fluid. No focal airspace disease. No pneumothorax. IMPRESSION: Cardiomegaly with tortuous thoracic aorta. Indistinct pulmonary vasculature raising concern for pulmonary edema. Electronically Signed   By: Jeb Levering M.D.   On: 03/26/2016 01:09   Dg Abd Portable 1 View  Result Date: 03/26/2016 CLINICAL DATA:  Sudden onset upper abdominal pain. EXAM: PORTABLE ABDOMEN - 1 VIEW COMPARISON:  None. FINDINGS: No evidence of free intra- abdominal air. Mild gaseous gastric distention. No small bowel dilatation. Air in stool throughout the visualized colon. Lower abdomen not included in the field of view. No radiopaque calculi are evident. IMPRESSION: No evidence of free intra-abdominal air. Mild gaseous gastric distension without bowel obstruction. Electronically Signed   By: Jeb Levering M.D.   On: 03/26/2016 01:11   Ct Angio Chest/abd/pel For Dissection W And/or Wo Contrast  Result Date: 03/26/2016 CLINICAL DATA:  Acute onset of chest and abdominal pain. Episode of vomiting. EXAM: CT ANGIOGRAPHY CHEST, ABDOMEN AND PELVIS TECHNIQUE: Multidetector CT imaging through the chest, abdomen and pelvis was performed using the standard protocol during bolus administration of  intravenous contrast. Multiplanar reconstructed images and MIPs were obtained and reviewed to evaluate the vascular anatomy. CONTRAST:  100 cc Isovue-300 IV COMPARISON:  CT abdomen 11/05/2015. Chest and abdominal radiographs earlier this day. FINDINGS: CTA CHEST FINDINGS Cardiovascular: Preferential opacification of the thoracic aorta. No evidence of thoracic aortic aneurysm or dissection. Mild atherosclerosis. No acute aortic syndrome or aortic hematoma. Conventional branching pattern from the aortic arch. Mild multi chamber cardiomegaly. Pacemaker with leads in the right atrium and ventricle. No central pulmonary embolus. No pericardial effusion. Mediastinum/Nodes: No mediastinal or hilar adenopathy. The esophagus is decompressed. Visualized thyroid gland is unremarkable. Lungs/Pleura: No consolidation to suggest pneumonia. Scattered subsegmental atelectasis in both lower lobes. Minimal smooth septal and peribronchial thickening. No pleural fluid. Musculoskeletal: There are no acute or suspicious osseous abnormalities. Degenerative change throughout spine. Review of the MIP images confirms the above findings. CTA ABDOMEN AND PELVIS FINDINGS VASCULAR Aorta: Normal caliber aorta without aneurysm, dissection, vasculitis or significant stenosis. Mild to moderate atherosclerosis. Celiac: Patent without evidence of aneurysm, dissection, vasculitis or significant stenosis. SMA: Patent without evidence of aneurysm, dissection, vasculitis or significant stenosis. Renals: Both renal arteries are patent without evidence of aneurysm, dissection, vasculitis, fibromuscular dysplasia or significant stenosis. IMA: Patent without evidence of aneurysm, dissection, vasculitis or significant stenosis. Inflow: Patent without evidence of aneurysm, dissection, vasculitis or significant stenosis. Veins: No obvious venous abnormality within the limitations of this arterial phase study. Review of the MIP images confirms the above findings.  NON-VASCULAR Hepatobiliary: The liver is prominent size with mild steatosis. No evidence of focal lesion. Gallbladder physiologically distended, no calcified stone. No biliary dilatation. Pancreas: No ductal dilatation or inflammation. Spleen: Normal in size without focal abnormality. Adrenals/Urinary Tract: Normal adrenal glands. No hydronephrosis. Symmetric renal enhancement. Tiny cortical hypodensities in the right kidney are too small to characterize but likely cysts. No perinephric edema. Urinary bladder is distended. Stomach/Bowel: Colonic diverticulosis throughout the entire colon, most prominent in the descending. No acute diverticulitis. Moderate stool burden proximally. No colonic wall thickening or inflammation. No small bowel dilatation. Mild gaseous gastric distention. No gastric wall thickening. Normal appendix. Lymphatic: No adenopathy. Reproductive: Heterogeneous enlarged prostate gland spans 5.9 cm transverse. There is  mass effect on the bladder base. Other: Fat within both inguinal canals, right greater than left. Tiny fat containing umbilical hernia. No free air, free fluid, or intra-abdominal fluid collection. Musculoskeletal: There are no acute or suspicious osseous abnormalities. Multilevel degenerative change throughout the lumbar spine. Review of the MIP images confirms the above findings. IMPRESSION: 1. No aortic dissection or acute aortic abnormality. Diffuse atheromatous change. 2. Minimal pulmonary edema, no additional acute chest finding. 3. No acute abnormality in the abdomen or pelvis. 4. Colonic diverticulosis without diverticulitis. Enlarged prostate gland causing mass effect on the bladder base. Hepatic steatosis. Electronically Signed   By: Jeb Levering M.D.   On: 03/26/2016 02:06    Impression: 76 year old male with a significant cardiac history who last received Pradaxa anticoagulant this morning presented to the emergency department with abdominal pain which was similar  in nature to his previous admission in August 2017 for the same. At that time, it was deemed likely musculoskeletal in nature. He recently saw his GI provider (Dr. Amedeo Plenty) and was started on PPI which improved his symptoms until his presentation to the ER. Essentially he has ruled out for cardiac involvement with the exception of mildly elevated BNP and mild pleural effusion.  On exam his is somewhat sore to palpation, though not as much as I would expect for an obstruction. Tympanic to palpation. He seems to be comfortable, but states he recently received pain medication. Was informed by nursing that an order has been put in for NG tube. He is not on NSAIDs, hgb normal but admits dark stools intermittently. GI differentials include esophagitis/gastritis, PUD, uncontrolled GERD, less likely early SBO.   Plan: 1. Check stool for heme 2. Repeat CBC tomorrow 3. Continue Protonix bid 4. Pain management per hospitalist 5. Pending symptom progression can consider EGD here or as outpatient to further evaluate 6. Supportive measures   LOS: 0 days     03/26/2016, 3:58 PM

## 2016-03-26 NOTE — Progress Notes (Signed)
Patient continues to c/o upper abdominal pain after receiving protonix and morphine.  Patient refuses GI cocktail.  Dr. Marin Comment notified via text page.

## 2016-03-26 NOTE — Progress Notes (Signed)
*  PRELIMINARY RESULTS* Echocardiogram 2D Echocardiogram has been performed.  Leavy Cella 03/26/2016, 11:42 AM

## 2016-03-26 NOTE — Progress Notes (Signed)
FOLLOW UP NOTE FROM EARLIER ADMISSION:  Chart reviewed, patient's seen, spoke to RN. 76 yo with prior episode of abdominal pain in 8/17, another bout in Dec, and admitted yesterday with abdominal pain and distention.  He has normal BM, and work up was negative including abdominal CT and abdominal CTA.  Lipase and LFT were normal.  His prior HIDA scan was negative with EF of 80 percent. On exam, he has hypertypany and suggests significant gas. A KUB confirmed significant gas in the small bowel, can't exclude developing SBO. Exam showed non acute abdomen.  WIll make NPO, place NGT, and follow expectantly.   He sees Dr Amedeo Plenty in [facility] and had negative colonosopy 5 years ago, except for " not cancer polyps". Will consult GI as well here.  Thanks,  Orvan Falconer MD FACP Hopsitalist.

## 2016-03-26 NOTE — Progress Notes (Signed)
Dr. Marin Comment notified of NG placement.  Dr. Marin Comment gave order for patient to be NPO ice chips and to hold off on Ambien po at this time since Dilaudid is ordered.  Continue low intermittent suction.

## 2016-03-26 NOTE — H&P (Signed)
History and Physical    Peter Howe [id number] DOB: 26-Aug-1940 DOA: 03/25/2016  PCP: Sherrie Mustache, MD Consultants:  Allred - cardiology; Amedeo Plenty - GI Patient coming from: home -  Lives alone; NOK: brother, [phone]  Chief Complaint: abdominal pain  HPI: Peter Howe is a 76 y.o. male with medical history significant of tachy-brady syndrome with a pacemaker, afib on anticoagulation with Pradaxa, and HTN presenting with an episode of severe abdominal pain that radiated into his chest.  Patient started having pain from abdomen into the chest between 5-7pm.  Had a similar presentation in August, just wasn't quite as bad then as today.  He was admitted and it was thought to be MSK in nature.  The pain was epigastric, radiated into substernal regiobn.  Watching tv when it started.  Nausea a couple of times, a few episodes of vomiting with streaks of blood in it.  Has had blood in the stool., supposed to give another specimen next week with Dr. Edrick Oh.  He saw Dr. Amedeo Plenty in November and started Prilosec with improvement prior to today.  +palpitations, "kind of out of rhythm."  BP was 202/101.  Prior stress test in 1/07, low risk nuclear study.  Last Echo appears to have been in 2014, moderate LVH, preserved EF, unable to assess diastolic function.   ED Course: Per Dr. Leonides Schanz:  MEDICAL DECISION MAKING: Patient with abdominal pain that radiates into his chest. Differential diagnosis includes gastritis, peptic ulcer disease, cholecystitis or cholelithiasis, pancreatitis, ACS, dissection. EKG shows atrial fibrillation which patient has a history of what appears to be new T-wave inversions in inferior lateral leads. Patient was admitted to the hospital in August for similar symptoms but states today is more severe. That time had negative serial troponins, negative CT abdomen and pelvis, negative right upper ultrasound, negative HIDA scan. Pain was thought to be musculoskeletal. Stress test  was in January 2017 and was low risk. Will obtain labs, urine, CT of his chest, abdomen and pelvis for further evaluation. Will give morphine, Zofran and IV fluids.  ED PROGRESS: 12:10 AM Pt still moaning in pain. Labs show leukocytosis with left shift. Troponin negative and this is after 4 hours of consistent pain. I feel like this is less likely ACS. He denies any improvement in pain after morphine, Protonix, GI cocktail. We'll give IV Dilaudid.  12:50 AM Pt continues to have pain. Blood pressure is rising. We'll give IV hydralazine. CMP, lipase hemolyzed and had to be redrawn. We'll obtain upright, portable chest and abdominal films to evaluate for widened mediastinum, free air while we are waiting his creatinine before CT scan.  1:05 AM Pt's chest x-ray shows widened mediastinum that appears baseline for patient. No free air. No sign of bowel obstruction. No infiltrate or edema. On review of patient's CT scan in August 2017, patient had atherosclerosis of his aorta but no aneurysm. Blood pressure has improved after hydralazine is in the 170s to 180s/80s to 90s. Reports he is on atenolol and another blood pressure medication which she cannot recall the name - losartan per records. States he has been taking them regularly. States that this is much higher than his blood pressure normally runs. Discussed with him that this could be because of pain or could this could be what is causing his pain. We'll run an i-STAT Chem-8 to obtain a creatinine immediately so that we can taken for CT scan to rule out dissection. Pain is still a 7/10. Will give second round of  IV dilaudid.  1:15 AM Pt's Cr, LFTs, lipase are normal. Will take over to CT.  1:35 AM Pt's pain is now a 3/10 in his blood pressure is 170/79. Patient had received 3 mg of IV Dilaudid, 4 mg of IV morphine to get his pain controlled but now he is mildly hypoxic after all these narcotics. Placed on 2 L nasal cannula. I have reviewed his CT imaging do not  see sign of dissection or aneurysm. Awaiting official read.  2:15 AM Pt's CT scan shows no acute abnormality other than very mild pulmonary edema. BNP 252. We'll hold further IV hydration. Unclear etiology for patient's pain. I do feel this is atypical for ACS but he does have multiple risk factors (age, obesity, HTN, previous tobacco use), appeared to have new T-wave inversions on EKG and given he required multiple rounds of narcotics for pain control I feel it would be reasonable to admit him for observation for a chest pain rule out. Patient is on is completely pain-free at this time. Discussed with patient this could've also been hypertensive urgency causing his symptoms. Blood pressure is now 153/94. Will discuss with hospitalist.    Review of Systems: As per HPI; otherwise 10 point review of systems reviewed and negative.   Ambulatory Status:  Ambulates without assistance  Past Medical History:  Diagnosis Date  . Hypertension   . Overweight(278.02)    obesity  . Permanent atrial fibrillation ([place])   . Tachycardia-bradycardia [facility])    s/p PPM    Past Surgical History:  Procedure Laterality Date  . PACEMAKER INSERTION  03/28/03   St. Jude-PPM    Social History   Social History  . Marital status: Widowed    Spouse name: N/A  . Number of children: N/A  . Years of education: N/A   Occupational History  . runs a furniture store     [employer]  . Smoking status: Former Smoker    Packs/day: 1.00    Years: 30.00    Types: Cigarettes    Start date: 03/30/1955    Quit date: 03/09/1993  . Smokeless tobacco: Never Used  . Alcohol use 0.0 oz/week     Comment: 0-3 beers daily with rare excess  . Drug use: No  . Sexual activity: Not on file   Other Topics Concern  . Not on file   Social History Narrative   Owns a Engineer, technical sales, enjoys golf   Lives in [place] Reactions  . Penicillins Rash    Has patient had a PCN  reaction causing immediate rash, facial/tongue/throat swelling, SOB or lightheadedness with hypotension: No Has patient had a PCN reaction causing severe rash involving mucus membranes or skin necrosis: No Has patient had a PCN reaction that required hospitalization No Has patient had a PCN reaction occurring within the last 10 years: No If all of the above answers are "NO", then may proceed with Cephalosporin use.   . Sulfonamide Derivatives Rash    Family History  Problem Relation Age of Onset  . Cancer Mother 32  . CAD Father 61    Prior to Admission medications   Medication Sig Start Date End Date Taking? Authorizing Provider  atenolol (TENORMIN) 100 MG tablet Take 100 mg by mouth daily.      Historical Provider, MD  losartan (COZAAR) 50 MG tablet Take 50 mg by mouth daily.    Historical Provider, MD  Multiple Vitamin (MULTIVITAMIN  WITH MINERALS) TABS tablet Take 1 tablet by mouth daily.    Historical Provider, MD  nitroGLYCERIN (NITROSTAT) 0.4 MG SL tablet Place 1 tablet (0.4 mg total) under the tongue every 5 (five) minutes as needed for chest pain. 03/26/15   Herminio Commons, MD  PRADAXA 150 MG CAPS capsule TAKE 1 CAPSULE BY MOUTH TWICE DAILY 07/22/15   Herminio Commons, MD  zolpidem (AMBIEN) 10 MG tablet Take 5 mg by mouth at bedtime as needed for sleep.     Historical Provider, MD    Physical Exam: Vitals:   03/26/16 0130 03/26/16 0200 03/26/16 0230 03/26/16 0300  BP: 172/94 153/94 172/84 176/85  Pulse: 103 67 70 68  Resp: 17 17 17 18   Temp:      TempSrc:      SpO2: (!) 82% 96% 96% 98%     General: Appears calm and comfortable and is NAD Eyes:  PERRL, EOMI, normal lids, iris ENT:  grossly normal hearing, lips & tongue, mmm Neck:  no LAD, masses or thyromegaly Cardiovascular:  RRR, no m/r/g. No LE edema.  Respiratory:  CTA bilaterally, no w/r/r. Normal respiratory effort. Abdomen:  soft, mild-moderate mid-epigastric TTP, nd, NABS, protuberant abdomen Skin:  no  rash or induration seen on limited exam Musculoskeletal:  grossly normal tone BUE/BLE, good ROM, no bony abnormality Psychiatric:  grossly normal mood and affect, speech fluent and appropriate, AOx3 Neurologic:  CN 2-12 grossly intact, moves all extremities in coordinated fashion, sensation intact  Labs on Admission: I have personally reviewed following labs and imaging studies  CBC:  Recent Labs Lab 03/25/16 2325 03/26/16 0113  WBC 14.0*  --   NEUTROABS 12.1*  --   HGB 15.8 16.0  HCT 46.7 47.0  MCV 97.1  --   PLT 146*  --    Basic Metabolic Panel:  Recent Labs Lab 03/26/16 0031 03/26/16 0113  NA 138 139  K 4.0 3.9  CL 98* 101  CO2 26  --   GLUCOSE 131* 125*  BUN 15 14  CREATININE 1.09 1.10  CALCIUM 9.1  --    GFR: CrCl cannot be calculated (Unknown ideal weight.). Liver Function Tests:  Recent Labs Lab 03/26/16 0031  AST 33  ALT 31  ALKPHOS 55  BILITOT 0.8  PROT 7.0  ALBUMIN 4.3    Recent Labs Lab 03/26/16 0031  LIPASE 23   No results for input(s): AMMONIA in the last 168 hours. Coagulation Profile: No results for input(s): INR, PROTIME in the last 168 hours. Cardiac Enzymes: No results for input(s): CKTOTAL, CKMB, CKMBINDEX, TROPONINI in the last 168 hours. BNP (last 3 results) No results for input(s): PROBNP in the last 8760 hours. HbA1C: No results for input(s): HGBA1C in the last 72 hours. CBG: No results for input(s): GLUCAP in the last 168 hours. Lipid Profile: No results for input(s): CHOL, HDL, LDLCALC, TRIG, CHOLHDL, LDLDIRECT in the last 72 hours. Thyroid Function Tests: No results for input(s): TSH, T4TOTAL, FREET4, T3FREE, THYROIDAB in the last 72 hours. Anemia Panel: No results for input(s): VITAMINB12, FOLATE, FERRITIN, TIBC, IRON, RETICCTPCT in the last 72 hours. Urine analysis:    Component Value Date/Time   COLORURINE YELLOW 03/26/2016 0201   APPEARANCEUR CLEAR 03/26/2016 0201   LABSPEC 1.023 03/26/2016 0201   PHURINE 7.0  03/26/2016 0201   GLUCOSEU 50 (A) 03/26/2016 0201   HGBUR SMALL (A) 03/26/2016 0201   BILIRUBINUR NEGATIVE 03/26/2016 0201   KETONESUR 5 (A) 03/26/2016 0201   PROTEINUR 30 (A)  03/26/2016 0201   UROBILINOGEN 0.2 08/15/2012 1119   NITRITE NEGATIVE 03/26/2016 0201   LEUKOCYTESUR NEGATIVE 03/26/2016 0201    Creatinine Clearance: CrCl cannot be calculated (Unknown ideal weight.).  Sepsis Labs: @LABRCNTIP (procalcitonin:4,lacticidven:4) )No results found for this or any previous visit (from the past 240 hour(s)).   Radiological Exams on Admission: Dg Chest Portable 1 View  Result Date: 03/26/2016 CLINICAL DATA:  Sudden onset of chest and upper abdominal pain. Vomiting. EXAM: PORTABLE CHEST 1 VIEW COMPARISON:  Radiographs 11/05/2015 FINDINGS: Multi lead right-sided pacemaker remains in place. Cardiomegaly with tortuous thoracic aorta is stable. Indistinct pulmonary vasculature raises concern for pulmonary edema. No pleural fluid. No focal airspace disease. No pneumothorax. IMPRESSION: Cardiomegaly with tortuous thoracic aorta. Indistinct pulmonary vasculature raising concern for pulmonary edema. Electronically Signed   By: Jeb Levering M.D.   On: 03/26/2016 01:09   Dg Abd Portable 1 View  Result Date: 03/26/2016 CLINICAL DATA:  Sudden onset upper abdominal pain. EXAM: PORTABLE ABDOMEN - 1 VIEW COMPARISON:  None. FINDINGS: No evidence of free intra- abdominal air. Mild gaseous gastric distention. No small bowel dilatation. Air in stool throughout the visualized colon. Lower abdomen not included in the field of view. No radiopaque calculi are evident. IMPRESSION: No evidence of free intra-abdominal air. Mild gaseous gastric distension without bowel obstruction. Electronically Signed   By: Jeb Levering M.D.   On: 03/26/2016 01:11   Ct Angio Chest/abd/pel For Dissection W And/or Wo Contrast  Result Date: 03/26/2016 CLINICAL DATA:  Acute onset of chest and abdominal pain. Episode of vomiting.  EXAM: CT ANGIOGRAPHY CHEST, ABDOMEN AND PELVIS TECHNIQUE: Multidetector CT imaging through the chest, abdomen and pelvis was performed using the standard protocol during bolus administration of intravenous contrast. Multiplanar reconstructed images and MIPs were obtained and reviewed to evaluate the vascular anatomy. CONTRAST:  100 cc Isovue-300 IV COMPARISON:  CT abdomen 11/05/2015. Chest and abdominal radiographs earlier this day. FINDINGS: CTA CHEST FINDINGS Cardiovascular: Preferential opacification of the thoracic aorta. No evidence of thoracic aortic aneurysm or dissection. Mild atherosclerosis. No acute aortic syndrome or aortic hematoma. Conventional branching pattern from the aortic arch. Mild multi chamber cardiomegaly. Pacemaker with leads in the right atrium and ventricle. No central pulmonary embolus. No pericardial effusion. Mediastinum/Nodes: No mediastinal or hilar adenopathy. The esophagus is decompressed. Visualized thyroid gland is unremarkable. Lungs/Pleura: No consolidation to suggest pneumonia. Scattered subsegmental atelectasis in both lower lobes. Minimal smooth septal and peribronchial thickening. No pleural fluid. Musculoskeletal: There are no acute or suspicious osseous abnormalities. Degenerative change throughout spine. Review of the MIP images confirms the above findings. CTA ABDOMEN AND PELVIS FINDINGS VASCULAR Aorta: Normal caliber aorta without aneurysm, dissection, vasculitis or significant stenosis. Mild to moderate atherosclerosis. Celiac: Patent without evidence of aneurysm, dissection, vasculitis or significant stenosis. SMA: Patent without evidence of aneurysm, dissection, vasculitis or significant stenosis. Renals: Both renal arteries are patent without evidence of aneurysm, dissection, vasculitis, fibromuscular dysplasia or significant stenosis. IMA: Patent without evidence of aneurysm, dissection, vasculitis or significant stenosis. Inflow: Patent without evidence of  aneurysm, dissection, vasculitis or significant stenosis. Veins: No obvious venous abnormality within the limitations of this arterial phase study. Review of the MIP images confirms the above findings. NON-VASCULAR Hepatobiliary: The liver is prominent size with mild steatosis. No evidence of focal lesion. Gallbladder physiologically distended, no calcified stone. No biliary dilatation. Pancreas: No ductal dilatation or inflammation. Spleen: Normal in size without focal abnormality. Adrenals/Urinary Tract: Normal adrenal glands. No hydronephrosis. Symmetric renal enhancement. Tiny cortical hypodensities in the  right kidney are too small to characterize but likely cysts. No perinephric edema. Urinary bladder is distended. Stomach/Bowel: Colonic diverticulosis throughout the entire colon, most prominent in the descending. No acute diverticulitis. Moderate stool burden proximally. No colonic wall thickening or inflammation. No small bowel dilatation. Mild gaseous gastric distention. No gastric wall thickening. Normal appendix. Lymphatic: No adenopathy. Reproductive: Heterogeneous enlarged prostate gland spans 5.9 cm transverse. There is mass effect on the bladder base. Other: Fat within both inguinal canals, right greater than left. Tiny fat containing umbilical hernia. No free air, free fluid, or intra-abdominal fluid collection. Musculoskeletal: There are no acute or suspicious osseous abnormalities. Multilevel degenerative change throughout the lumbar spine. Review of the MIP images confirms the above findings. IMPRESSION: 1. No aortic dissection or acute aortic abnormality. Diffuse atheromatous change. 2. Minimal pulmonary edema, no additional acute chest finding. 3. No acute abnormality in the abdomen or pelvis. 4. Colonic diverticulosis without diverticulitis. Enlarged prostate gland causing mass effect on the bladder base. Hepatic steatosis. Electronically Signed   By: Jeb Levering M.D.   On: 03/26/2016  02:06    EKG: Independently reviewed.  Afib with rate 73; nonspecific ST changes including T wave inversions in the inferolateral leads with no evidence of acute ischemia  Assessment/Plan Principal Problem:   Abdominal pain Active Problems:   Hypertension   ATRIAL FIBRILLATION   Chest pain   Hyperglycemia   Abdominal pain/Chest pain -Patient with epigastric abdominal/substernal chest pain that came on at rest, and resulted in significant pain that has finally subsided after pain medications In the ER -1/3 typical symptoms suggestive of noncardiac chest pain.  -CXR unremarkable.   -Initial cardiac troponin negative.   -EKG not indicative of acute ischemia but does show apparent new T wave inversions.   -TIMI risk score is 1; which predicts a 14 days risk of death, recurrent MI, or urgent revascularization of 4.7%.  -He does have a mildly elevated WBC count, will trend -Will plan to place in observation status on telemetry to rule out ACS by overnight observation.  -cycle troponin q6h x 3 and repeat EKG in AM -Start ASA 325 mg PO daily -morphine given -Will also give Protonix 80 mg PO BID for now -Risk factor stratification with FLP and HgbA1c (no known h/o DM but patient does have hyperglycemia, glucose 152); will also check TSH and UDS -With elevated BNP (252, no prior) and no recent Echo, will order an Echo for tomorrow -If troponins and Echo are normal and the pain does not recur, the patient is likely appropriate for discharge without further intervention on 1/18 afternoon  HTN -Takes Atenolol, Cozaar at home -Patient with suboptimal control while in the ER -Consider addition of a diuretic (change to Hyzaar?) -Will also add prn IV hydralazine for now  Afib -Rate controlled on Atenolol -Continue Pradaxa     DVT prophylaxis: Pradaxa Code Status: Full - confirmed with patient/family Family Communication: Brother present throughout evaluation Disposition Plan:  Home once  clinically improved Consults called: None  Admission status: It is my clinical opinion that referral for OBSERVATION is reasonable and necessary in this patient based on the above information provided. The aforementioned taken together are felt to place the patient at high risk for further clinical deterioration. However it is anticipated that the patient may be medically stable for discharge from the hospital within 24 to 48 hours.     Karmen Bongo MD [facility]  If 7PM-7AM, please contact night-coverage [email or web address] Password [facility]  03/26/2016, 3:13  AM

## 2016-03-26 NOTE — Care Management Note (Signed)
Case Management Note  Patient Details  Name: Peter Howe MRN: [id number] Date of Birth: 16-Sep-1940  Subjective/Objective:     Patient adm from home with CP/abd pain. He lives alone, ind with ADL's. Has PCP, still drives to appointments, reports no issues affording medications. No HH/DME PTA. Currently on 2L, do not anticipate he will need oxygen at time of discharge.  Action/Plan: Anticipate DC home with self care.   Expected Discharge Date:       03/26/2016           Expected Discharge Plan:  Home/Self Care  In-House Referral:  NA  Discharge planning Services  CM Consult  Post Acute Care Choice:  NA Choice offered to:  NA  DME Arranged:    DME Agency:     HH Arranged:    HH Agency:     Status of Service:  In process, will continue to follow  If discussed at Long Length of Stay Meetings, dates discussed:    Additional Comments:  Peter Howe, [name], RN 03/26/2016, 11:33 AM

## 2016-03-26 NOTE — Progress Notes (Signed)
Patient reports upper abdominal pain, indigestion, and nausea are worse.  Dr. Marin Comment notified via text page.  Reports this being the same type of pain that during his recent hospitalization.

## 2016-03-26 NOTE — Care Management Obs Status (Signed)
[place] NOTIFICATION   Patient Details  Name: Peter Howe MRN: [id number] Date of Birth: 11-Jul-1940   Medicare Observation Status Notification Given:  Yes    Elise Gladden, [name], RN 03/26/2016, 11:37 AM

## 2016-03-27 DIAGNOSIS — I480 Paroxysmal atrial fibrillation: Secondary | ICD-10-CM | POA: Diagnosis not present

## 2016-03-27 DIAGNOSIS — I482 Chronic atrial fibrillation: Secondary | ICD-10-CM | POA: Diagnosis not present

## 2016-03-27 DIAGNOSIS — R140 Abdominal distension (gaseous): Secondary | ICD-10-CM | POA: Diagnosis not present

## 2016-03-27 DIAGNOSIS — R079 Chest pain, unspecified: Secondary | ICD-10-CM | POA: Diagnosis not present

## 2016-03-27 DIAGNOSIS — I10 Essential (primary) hypertension: Secondary | ICD-10-CM | POA: Diagnosis not present

## 2016-03-27 DIAGNOSIS — R1010 Upper abdominal pain, unspecified: Secondary | ICD-10-CM | POA: Diagnosis not present

## 2016-03-27 DIAGNOSIS — R112 Nausea with vomiting, unspecified: Secondary | ICD-10-CM | POA: Diagnosis not present

## 2016-03-27 DIAGNOSIS — K56699 Other intestinal obstruction unspecified as to partial versus complete obstruction: Secondary | ICD-10-CM | POA: Diagnosis not present

## 2016-03-27 LAB — CBC WITH DIFFERENTIAL/PLATELET
BASOS ABS: 0 10*3/uL (ref 0.0–0.1)
Basophils Relative: 0 %
EOS PCT: 0 %
Eosinophils Absolute: 0.1 10*3/uL (ref 0.0–0.7)
HCT: 48.8 % (ref 39.0–52.0)
Hemoglobin: 16.3 g/dL (ref 13.0–17.0)
Lymphocytes Relative: 5 %
Lymphs Abs: 1.2 10*3/uL (ref 0.7–4.0)
MCH: 33.1 pg (ref 26.0–34.0)
MCHC: 33.4 g/dL (ref 30.0–36.0)
MCV: 99 fL (ref 78.0–100.0)
MONO ABS: 1.8 10*3/uL — AB (ref 0.1–1.0)
Monocytes Relative: 8 %
Neutro Abs: 20 10*3/uL — ABNORMAL HIGH (ref 1.7–7.7)
Neutrophils Relative %: 87 %
PLATELETS: 129 10*3/uL — AB (ref 150–400)
RBC: 4.93 MIL/uL (ref 4.22–5.81)
RDW: 13.1 % (ref 11.5–15.5)
WBC: 23.1 10*3/uL — ABNORMAL HIGH (ref 4.0–10.5)

## 2016-03-27 LAB — HEMOGLOBIN A1C
HEMOGLOBIN A1C: 5.5 % (ref 4.8–5.6)
MEAN PLASMA GLUCOSE: 111 mg/dL

## 2016-03-27 NOTE — Progress Notes (Signed)
Subjective: Pain a little better today. No bowel movement. NGT is tolerable. Passed some flatus this morning. No further nausea/vomiting with NGT. No further GI complaints.  Objective: Vital signs in last 24 hours: Temp:  [98.2 F (36.8 C)-98.4 F (36.9 C)] 98.2 F (36.8 C) (01/19 0600) Pulse Rate:  [75-91] 87 (01/19 0600) Resp:  [16-20] 16 (01/19 0600) BP: (149-160)/(80-100) 160/100 (01/19 0600) SpO2:  [96 %-97 %] 97 % (01/19 0600) Last BM Date: 03/25/16 General:   Alert and oriented, pleasant Eyes:  No icterus, sclera clear. Conjuctiva pink.  Nose:  NGT in place to left nare, secured. Minimal output in canister. Neck:  Supple, without thyromegaly or masses.  Heart:  Irregularly irregular.  Lungs: Clear to auscultation bilaterally, without wheezing, rales, or rhonchi.  Abdomen:  Bowel sounds present and seem more active today, soft to firm, distended, tympanic, mild TTP generalized abdomen somewhat improved today. No HSM or hernias noted. No rebound or guarding. Msk:  Symmetrical without gross deformities. Pulses:  Normal bilateral DP pulses noted. Extremities:  Without clubbing or edema. Neurologic:  Alert and  oriented x4;  grossly normal neurologically. Psych:  Alert and cooperative. Normal mood and affect.  Intake/Output from previous day: 01/18 0701 - 01/19 0700 In: 508.3 [I.V.:508.3] Out: 1050 [Urine:400; Emesis/NG output:650] Intake/Output this shift: No intake/output data recorded.  Lab Results:  Recent Labs  03/25/16 2325 03/26/16 0113 03/27/16 0522  WBC 14.0*  --  23.1*  HGB 15.8 16.0 16.3  HCT 46.7 47.0 48.8  PLT 146*  --  129*   BMET  Recent Labs  03/26/16 0031 03/26/16 0113  NA 138 139  K 4.0 3.9  CL 98* 101  CO2 26  --   GLUCOSE 131* 125*  BUN 15 14  CREATININE 1.09 1.10  CALCIUM 9.1  --    LFT  Recent Labs  03/26/16 0031  PROT 7.0  ALBUMIN 4.3  AST 33  ALT 31  ALKPHOS 55  BILITOT 0.8   PT/INR No results for input(s):  LABPROT, INR in the last 72 hours. Hepatitis Panel No results for input(s): HEPBSAG, HCVAB, HEPAIGM, HEPBIGM in the last 72 hours.   Studies/Results: Dg Abd 1 View  Result Date: 03/26/2016 CLINICAL DATA:  Upper abdominal pain. EXAM: ABDOMEN - 1 VIEW COMPARISON:  03/26/2016 0052 hours FINDINGS: Distended loops of small bowel are present in the right hemiabdomen. There is some colonic gas. The stomach is gas-filled but nondistended. There is no obvious free intraperitoneal gas. No pneumatosis. There is contrast within the bladder. IMPRESSION: Persistent distended small bowel loops. They or disproportionate with respect to colonic gas. Developing small bowel obstruction is not excluded. Close follow-up is warranted. Electronically Signed   By: Marybelle Killings M.D.   On: 03/26/2016 14:36   Dg Chest Port 1 View  Result Date: 03/26/2016 CLINICAL DATA:  76 year old male status post NG tube placement. EXAM: PORTABLE CHEST 1 VIEW COMPARISON:  Chest x-ray 03/26/2016. FINDINGS: A nasogastric tube is seen extending into the stomach, however, the tip of the nasogastric tube extends below the lower margin of the image. Lung volumes are low. No consolidative airspace disease. No pleural effusions. Cephalization of the pulmonary vasculature, with diffuse peribronchial cuffing and mild indistinctness of the interstitial markings, suggesting mild interstitial pulmonary edema. Mild cardiomegaly. The patient is rotated to the right on today's exam, resulting in distortion of the mediastinal contours and reduced diagnostic sensitivity and specificity for mediastinal pathology. 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 IMPRESSION: 1. Nasogastric tube extends into the stomach (tip is below the lower margin of the image). 2. The appearance the chest suggests mild congestive heart failure, as above. Electronically Signed   By: Vinnie Langton M.D.   On: 03/26/2016 18:18   Dg Chest Portable 1 View  Result Date: 03/26/2016 CLINICAL DATA:  Sudden onset of chest and upper abdominal pain. Vomiting. EXAM: PORTABLE CHEST 1 VIEW COMPARISON:  Radiographs 11/05/2015 FINDINGS: Multi lead right-sided pacemaker remains in place. Cardiomegaly with tortuous thoracic aorta is stable. Indistinct pulmonary vasculature raises concern for pulmonary edema. No pleural fluid. No focal airspace disease. No pneumothorax. IMPRESSION: Cardiomegaly with tortuous thoracic aorta. Indistinct pulmonary vasculature raising concern for pulmonary edema. Electronically Signed   By: Jeb Levering M.D.   On: 03/26/2016 01:09   Dg Abd Portable 1 View  Result Date: 03/26/2016 CLINICAL DATA:  Sudden onset upper abdominal pain. EXAM: PORTABLE ABDOMEN - 1 VIEW COMPARISON:  None. FINDINGS: No evidence of free intra- abdominal air. Mild gaseous gastric distention. No small bowel dilatation. Air in stool throughout the visualized colon. Lower abdomen not included in the field of view. No radiopaque calculi are evident. IMPRESSION: No evidence of free intra-abdominal air. Mild gaseous gastric distension without bowel obstruction. Electronically Signed   By: Jeb Levering M.D.   On: 03/26/2016 01:11   Ct Angio Chest/abd/pel For Dissection W And/or Wo Contrast  Result Date: 03/26/2016 CLINICAL DATA:  Acute onset of chest and abdominal pain. Episode of vomiting. EXAM: CT ANGIOGRAPHY CHEST, ABDOMEN AND PELVIS TECHNIQUE: Multidetector CT imaging through the chest, abdomen and pelvis was performed using the standard protocol during bolus administration of intravenous contrast. Multiplanar reconstructed images and MIPs were obtained and reviewed to evaluate the vascular anatomy. CONTRAST:  100 cc Isovue-300 IV COMPARISON:  CT abdomen 11/05/2015. Chest and abdominal radiographs earlier this day. FINDINGS: CTA CHEST FINDINGS Cardiovascular: Preferential opacification of the thoracic  aorta. No evidence of thoracic aortic aneurysm or dissection. Mild atherosclerosis. No acute aortic syndrome or aortic hematoma. Conventional branching pattern from the aortic arch. Mild multi chamber cardiomegaly. Pacemaker with leads in the right atrium and ventricle. No central pulmonary embolus. No pericardial effusion. Mediastinum/Nodes: No mediastinal or hilar adenopathy. The esophagus is decompressed. Visualized thyroid gland is unremarkable. Lungs/Pleura: No consolidation to suggest pneumonia. Scattered subsegmental atelectasis in both lower lobes. Minimal smooth septal and peribronchial thickening. No pleural fluid. Musculoskeletal: There are no acute or suspicious osseous abnormalities. Degenerative change throughout spine. Review of the MIP images confirms the above findings. CTA ABDOMEN AND PELVIS FINDINGS VASCULAR Aorta: Normal caliber aorta without aneurysm, dissection, vasculitis or significant stenosis. Mild to moderate atherosclerosis. Celiac: Patent without evidence of aneurysm, dissection, vasculitis or significant stenosis. SMA: Patent without evidence of aneurysm, dissection, vasculitis or significant stenosis. Renals: Both renal arteries are patent without evidence of aneurysm, dissection, vasculitis, fibromuscular dysplasia or significant stenosis. IMA: Patent without evidence of aneurysm, dissection, vasculitis or significant stenosis. Inflow: Patent without evidence of aneurysm, dissection, vasculitis or significant stenosis. Veins: No obvious venous abnormality within the limitations of this arterial phase study. Review of the MIP images confirms the above findings. NON-VASCULAR Hepatobiliary: The liver is prominent size with mild steatosis. No evidence of focal lesion. Gallbladder physiologically distended, no calcified stone. No biliary dilatation. Pancreas: No ductal dilatation or inflammation. Spleen: Normal in size without focal abnormality. Adrenals/Urinary Tract: Normal adrenal  glands. No hydronephrosis. Symmetric renal enhancement. Tiny cortical hypodensities  in the right kidney are too small to characterize but likely cysts. No perinephric edema. Urinary bladder is distended. Stomach/Bowel: Colonic diverticulosis throughout the entire colon, most prominent in the descending. No acute diverticulitis. Moderate stool burden proximally. No colonic wall thickening or inflammation. No small bowel dilatation. Mild gaseous gastric distention. No gastric wall thickening. Normal appendix. Lymphatic: No adenopathy. Reproductive: Heterogeneous enlarged prostate gland spans 5.9 cm transverse. There is mass effect on the bladder base. Other: Fat within both inguinal canals, right greater than left. Tiny fat containing umbilical hernia. No free air, free fluid, or intra-abdominal fluid collection. Musculoskeletal: There are no acute or suspicious osseous abnormalities. Multilevel degenerative change throughout the lumbar spine. Review of the MIP images confirms the above findings. IMPRESSION: 1. No aortic dissection or acute aortic abnormality. Diffuse atheromatous change. 2. Minimal pulmonary edema, no additional acute chest finding. 3. No acute abnormality in the abdomen or pelvis. 4. Colonic diverticulosis without diverticulitis. Enlarged prostate gland causing mass effect on the bladder base. Hepatic steatosis. Electronically Signed   By: Jeb Levering M.D.   On: 03/26/2016 02:06    Assessment: 76 year old male with a significant cardiac history who last received Pradaxa anticoagulant this morning presented to the emergency department with abdominal pain which was similar in nature to his previous admission in August 2017 for the same. At that time, it was deemed likely musculoskeletal in nature. He recently saw his GI provider (Dr. Amedeo Plenty) and was started on PPI which improved his symptoms until his presentation to the ER. Essentially he has ruled out for cardiac involvement with the  exception of mildly elevated BNP and mild pleural effusion.  On exam yesterday he was noted to be sore to palpation and tympanic to palpation as well as somewhat distended. He is not on NSAIDs, hgb normal but admits dark stools intermittently. Given progressive changes in KUB and concerns for early/developing SBO he was made NPO and an NGT was placed to low-intermittent suction.  He has not had a bowel movement and no heme stool card collected as of yet. His CBC this morning is reassuring for no significant blood loss with hgb 16.3. Leucocytosis worsened to 23.1 today, not currently on steroids; no antibiotics currently.   Today his abdomen remains distended and soft to firm, continued tympany, pain "a little better than yesterday." Tolerating NGT very well, has passed flatus this morning and bowel sounds more active today. Minimal to no concern about GI bleed given hgb yesterday and today. His WBC count is elevating, no dyspnea or cough. ? Source as KUB and CT abdomen without GI abnormalities indicating infection; possible respiratory with noted minimal pleural effusion on imaging. On sips and chips for comfort. Will follow closely for clinical changes, consider reimaging pending clinical progress.   Plan: 1. Continued bowel rest/NGT 2. Monitor closely 3. May warrant reimaging pending clinical changes 4. Continued PPI 5. No indication for urgent endoscopic evaluation at this time 6. Supportive measures   Thank you for allowing [place] to participate in the care of Peter Neptune, DNP, AGNP-C Adult & Gerontological Nurse Practitioner [facility]     LOS: 1 day    03/27/2016, 10:06 AM  \

## 2016-03-27 NOTE — Progress Notes (Addendum)
PROGRESS NOTE    Peter Howe  [id number] DOB: [birth year] DOA: 03/25/2016 PCP: Sherrie Mustache, MD    Brief Narrative:  76 yo with prior episode of abdominal pain in 8/17, another bout in Dec, and admitted  with abdominal pain and distention.  He has had normal BM, and work up was negative including abdominal CT and abdominal CTA.  Lipase and LFT were normal.  His prior HIDA scan was negative with EF of 80 percent. On exam, he has hypertypany and suggests significant gas. A KUB confirmed significant gas in the small bowel, can't exclude developing SBO. He was seen by GI, agreed with management, recommended CT vs KUB in am.  He is feeling a little better with the NGT.   Assessment & Plan:   Principal Problem:   Abdominal pain Active Problems:   Essential hypertension   ATRIAL FIBRILLATION   Chest pain   Hyperglycemia   Abnormal EKG   Non-intractable vomiting with nausea   Abdominal distension   1. Possible SBO:  Continue with NPO, IVF, and pain meds.  Will repeat KUB in the am.  Will continue with conservative Tx.   2.   HTN:  conitnue with Atenelol, Cozaar, and PRN IV Hydralazine.  ? From abdominal pain.  Will follow.   3.  Afib:  On Pradaxa, with rate controlled. Will d/c Pradaxa and place on SCD in case he requires surgery or EGD per GI 's note.   DVT prophylaxis: Heparin SQ. Code Status: FULL CODE.  Family Communication: daughter.  Disposition Plan: To home when better.   Consultants:   GI.   Procedures:   None.   Antimicrobials: Anti-infectives    None       Subjective:   Feeling a little better.    Objective: Vitals:   03/26/16 2052 03/27/16 0210 03/27/16 0600 03/27/16 1105  BP: (!) 155/95 (!) 153/80 (!) 160/100 (!) 153/87  Pulse: 91 75 87 (!) 103  Resp: 18 18 16 16   Temp: 98.4 F (36.9 C)  98.2 F (36.8 C)   TempSrc: Axillary  Oral   SpO2: 96% 97% 97%   Weight:      Height:        Intake/Output Summary (Last 24 hours) at  03/27/16 1523 Last data filed at 03/27/16 0600  Gross per 24 hour  Intake           508.33 ml  Output              650 ml  Net          -141.67 ml   Filed Weights   03/26/16 0341  Weight: 92.9 kg (204 lb 12.9 oz)    Examination:  General exam: Appears calm and comfortable  Respiratory system: Clear to auscultation. Respiratory effort normal. Cardiovascular system: S1 & S2 heard, RRR. No JVD, murmurs, rubs, gallops or clicks. No pedal edema. Gastrointestinal system: Abdomen is nondistended, soft and nontender. No organomegaly or masses felt. Normal bowel sounds heard. Central nervous system: Alert and oriented. No focal neurological deficits. Extremities: Symmetric 5 x 5 power. Skin: No rashes, lesions or ulcers Psychiatry: Judgement and insight appear normal. Mood & affect appropriate.   Data Reviewed: I have personally reviewed following labs and imaging studies  CBC:  Recent Labs Lab 03/25/16 2325 03/26/16 0113 03/27/16 0522  WBC 14.0*  --  23.1*  NEUTROABS 12.1*  --  20.0*  HGB 15.8 16.0 16.3  HCT 46.7 47.0 48.8  MCV 97.1  --  99.0  PLT 146*  --  [id number]*   Basic Metabolic Panel:  Recent Labs Lab 03/26/16 0031 03/26/16 0113  NA 138 139  K 4.0 3.9  CL 98* 101  CO2 26  --   GLUCOSE 131* 125*  BUN 15 14  CREATININE 1.09 1.10  CALCIUM 9.1  --    GFR: Estimated Creatinine Clearance: 59.7 mL/min (by C-G formula based on SCr of 1.1 mg/dL). Liver Function Tests:  Recent Labs Lab 03/26/16 0031  AST 33  ALT 31  ALKPHOS 55  BILITOT 0.8  PROT 7.0  ALBUMIN 4.3    Recent Labs Lab 03/26/16 0031  LIPASE 23   Cardiac Enzymes:  Recent Labs Lab 03/26/16 0508 03/26/16 0923 03/26/16 1510  TROPONINI <0.03 <0.03 <0.03   HbA1C:  Recent Labs  03/26/16 0031  HGBA1C 5.5   Lipid Profile:  Recent Labs  03/26/16 0031  CHOL 202*  HDL 100  LDLCALC 93  TRIG 47  CHOLHDL 2.0   Thyroid Function Tests:  Recent Labs  03/26/16 0031  TSH 1.306    Radiology Studies: Dg Abd 1 View  Result Date: 03/26/2016 CLINICAL DATA:  Upper abdominal pain. EXAM: ABDOMEN - 1 VIEW COMPARISON:  03/26/2016 0052 hours FINDINGS: Distended loops of small bowel are present in the right hemiabdomen. There is some colonic gas. The stomach is gas-filled but nondistended. There is no obvious free intraperitoneal gas. No pneumatosis. There is contrast within the bladder. IMPRESSION: Persistent distended small bowel loops. They or disproportionate with respect to colonic gas. Developing small bowel obstruction is not excluded. Close follow-up is warranted. Electronically Signed   By: Marybelle Killings M.D.   On: 03/26/2016 14:36   Dg Chest Port 1 View  Result Date: 03/26/2016 CLINICAL DATA:  76 year old male status post NG tube placement. EXAM: PORTABLE CHEST 1 VIEW COMPARISON:  Chest x-ray 03/26/2016. FINDINGS: A nasogastric tube is seen extending into the stomach, however, the tip of the nasogastric tube extends below the lower margin of the image. Lung volumes are low. No consolidative airspace disease. No pleural effusions. Cephalization of the pulmonary vasculature, with diffuse peribronchial cuffing and mild indistinctness of the interstitial markings, suggesting mild interstitial pulmonary edema. Mild cardiomegaly. The patient is rotated to the right on today's exam, resulting in distortion of the mediastinal contours and reduced diagnostic sensitivity and specificity for mediastinal pathology. 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 IMPRESSION: 1. Nasogastric tube extends into the stomach (tip is below the lower margin of the image). 2. The appearance the chest suggests mild congestive heart failure, as above. Electronically Signed   By: Vinnie Langton M.D.   On: 03/26/2016 18:18   Dg Chest Portable 1 View  Result Date: 03/26/2016 CLINICAL DATA:  Sudden onset of chest  and upper abdominal pain. Vomiting. EXAM: PORTABLE CHEST 1 VIEW COMPARISON:  Radiographs 11/05/2015 FINDINGS: Multi lead right-sided pacemaker remains in place. Cardiomegaly with tortuous thoracic aorta is stable. Indistinct pulmonary vasculature raises concern for pulmonary edema. No pleural fluid. No focal airspace disease. No pneumothorax. IMPRESSION: Cardiomegaly with tortuous thoracic aorta. Indistinct pulmonary vasculature raising concern for pulmonary edema. Electronically Signed   By: Jeb Levering M.D.   On: 03/26/2016 01:09   Dg Abd Portable 1 View  Result Date: 03/26/2016 CLINICAL DATA:  Sudden onset upper abdominal pain. EXAM: PORTABLE ABDOMEN - 1 VIEW COMPARISON:  None. FINDINGS: No evidence of free intra- abdominal air. Mild gaseous gastric  distention. No small bowel dilatation. Air in stool throughout the visualized colon. Lower abdomen not included in the field of view. No radiopaque calculi are evident. IMPRESSION: No evidence of free intra-abdominal air. Mild gaseous gastric distension without bowel obstruction. Electronically Signed   By: Jeb Levering M.D.   On: 03/26/2016 01:11   Ct Angio Chest/abd/pel For Dissection W And/or Wo Contrast  Result Date: 03/26/2016 CLINICAL DATA:  Acute onset of chest and abdominal pain. Episode of vomiting. EXAM: CT ANGIOGRAPHY CHEST, ABDOMEN AND PELVIS TECHNIQUE: Multidetector CT imaging through the chest, abdomen and pelvis was performed using the standard protocol during bolus administration of intravenous contrast. Multiplanar reconstructed images and MIPs were obtained and reviewed to evaluate the vascular anatomy. CONTRAST:  100 cc Isovue-300 IV COMPARISON:  CT abdomen 11/05/2015. Chest and abdominal radiographs earlier this day. FINDINGS: CTA CHEST FINDINGS Cardiovascular: Preferential opacification of the thoracic aorta. No evidence of thoracic aortic aneurysm or dissection. Mild atherosclerosis. No acute aortic syndrome or aortic hematoma.  Conventional branching pattern from the aortic arch. Mild multi chamber cardiomegaly. Pacemaker with leads in the right atrium and ventricle. No central pulmonary embolus. No pericardial effusion. Mediastinum/Nodes: No mediastinal or hilar adenopathy. The esophagus is decompressed. Visualized thyroid gland is unremarkable. Lungs/Pleura: No consolidation to suggest pneumonia. Scattered subsegmental atelectasis in both lower lobes. Minimal smooth septal and peribronchial thickening. No pleural fluid. Musculoskeletal: There are no acute or suspicious osseous abnormalities. Degenerative change throughout spine. Review of the MIP images confirms the above findings. CTA ABDOMEN AND PELVIS FINDINGS VASCULAR Aorta: Normal caliber aorta without aneurysm, dissection, vasculitis or significant stenosis. Mild to moderate atherosclerosis. Celiac: Patent without evidence of aneurysm, dissection, vasculitis or significant stenosis. SMA: Patent without evidence of aneurysm, dissection, vasculitis or significant stenosis. Renals: Both renal arteries are patent without evidence of aneurysm, dissection, vasculitis, fibromuscular dysplasia or significant stenosis. IMA: Patent without evidence of aneurysm, dissection, vasculitis or significant stenosis. Inflow: Patent without evidence of aneurysm, dissection, vasculitis or significant stenosis. Veins: No obvious venous abnormality within the limitations of this arterial phase study. Review of the MIP images confirms the above findings. NON-VASCULAR Hepatobiliary: The liver is prominent size with mild steatosis. No evidence of focal lesion. Gallbladder physiologically distended, no calcified stone. No biliary dilatation. Pancreas: No ductal dilatation or inflammation. Spleen: Normal in size without focal abnormality. Adrenals/Urinary Tract: Normal adrenal glands. No hydronephrosis. Symmetric renal enhancement. Tiny cortical hypodensities in the right kidney are too small to characterize  but likely cysts. No perinephric edema. Urinary bladder is distended. Stomach/Bowel: Colonic diverticulosis throughout the entire colon, most prominent in the descending. No acute diverticulitis. Moderate stool burden proximally. No colonic wall thickening or inflammation. No small bowel dilatation. Mild gaseous gastric distention. No gastric wall thickening. Normal appendix. Lymphatic: No adenopathy. Reproductive: Heterogeneous enlarged prostate gland spans 5.9 cm transverse. There is mass effect on the bladder base. Other: Fat within both inguinal canals, right greater than left. Tiny fat containing umbilical hernia. No free air, free fluid, or intra-abdominal fluid collection. Musculoskeletal: There are no acute or suspicious osseous abnormalities. Multilevel degenerative change throughout the lumbar spine. Review of the MIP images confirms the above findings. IMPRESSION: 1. No aortic dissection or acute aortic abnormality. Diffuse atheromatous change. 2. Minimal pulmonary edema, no additional acute chest finding. 3. No acute abnormality in the abdomen or pelvis. 4. Colonic diverticulosis without diverticulitis. Enlarged prostate gland causing mass effect on the bladder base. Hepatic steatosis. Electronically Signed   By: Jeb Levering M.D.   On: 03/26/2016  02:06    Scheduled Meds: . aspirin EC  325 mg Oral Daily  . atenolol  100 mg Oral Daily  . dabigatran  150 mg Oral BID  . losartan  50 mg Oral Daily  . pantoprazole  80 mg Oral BID  . simethicone  80 mg Oral QID   Continuous Infusions: . dextrose 5 % and 0.9% NaCl 50 mL/hr at 03/27/16 1300     LOS: 1 day   Jaishon Krisher, MD FACP Hospitalist.   If 7PM-7AM, please contact night-coverage [email or web address] Password [facility] 03/27/2016, 3:23 PM

## 2016-03-27 NOTE — Progress Notes (Signed)
MD has given verbal order for patient to have his ambien at bedtime. Will inform oncoming nurse.

## 2016-03-28 ENCOUNTER — Inpatient Hospital Stay (HOSPITAL_COMMUNITY): Payer: MEDICARE

## 2016-03-28 DIAGNOSIS — I10 Essential (primary) hypertension: Secondary | ICD-10-CM | POA: Diagnosis not present

## 2016-03-28 DIAGNOSIS — R112 Nausea with vomiting, unspecified: Secondary | ICD-10-CM | POA: Diagnosis not present

## 2016-03-28 DIAGNOSIS — R079 Chest pain, unspecified: Secondary | ICD-10-CM | POA: Diagnosis not present

## 2016-03-28 DIAGNOSIS — R140 Abdominal distension (gaseous): Secondary | ICD-10-CM | POA: Diagnosis not present

## 2016-03-28 DIAGNOSIS — K56699 Other intestinal obstruction unspecified as to partial versus complete obstruction: Secondary | ICD-10-CM | POA: Diagnosis not present

## 2016-03-28 DIAGNOSIS — I480 Paroxysmal atrial fibrillation: Secondary | ICD-10-CM | POA: Diagnosis not present

## 2016-03-28 NOTE — Progress Notes (Signed)
Night meds given PO as ordered. NG suction was stopped for at least an hour. Patient tolerated well.

## 2016-03-28 NOTE — Progress Notes (Signed)
Per daughter, patient had some intermittent confusion through the night. Patient was incontinent of urine x2 onto the floor. Patient thought he was tied up d/t the SCD's on his legs. This RN encouraged daughter to notify staff if patient was to be left alone so bed alarm could be turned on. Daughter agrees and states that she will probably be in room with patient until doctor sees him.

## 2016-03-28 NOTE — Progress Notes (Signed)
PROGRESS NOTE    CHANCE DIPACE  [id number] DOB: [birth year] DOA: 03/25/2016 PCP: Sherrie Mustache, MD    Brief Narrative: 76 yo with prior episode of abdominal pain in 8/17, another bout in Dec, and admitted  with abdominal pain and distention. He has had normal BM, and work up was negative including abdominal CT and abdominal CTA. Lipase and LFT were normal. His prior HIDA scan was negative with EF of 80 percent. On exam, he has hypertypany and suggests significant gas. A KUB confirmed significant gas in the small bowel, can't exclude developing SBO. He was seen by GI, agreed with management, recommended CT vs KUB in am.  He is feeling a little better with the NGT.    Assessment & Plan:   Principal Problem:   Abdominal pain Active Problems:   Essential hypertension   ATRIAL FIBRILLATION   Chest pain   Hyperglycemia   Abnormal EKG   Non-intractable vomiting with nausea   Abdominal distension   1.   Possible SBO:  Continue with NPO, IVF, and pain meds.  KUB improved.  Diet as per GI.  Will continue with conservative Tx.   2.   HTN:  conitnue with Atenelol, Cozaar, and PRN IV Hydralazine.  ? From abdominal pain.  Will follow.   3.  Afib:  On Pradaxa, with rate controlled. Will d/c Pradaxa and place on SCD in case he requires surgery or EGD per GI 's note.   DVT prophylaxis: Heparin SQ. Code Status: FULL CODE.  Family Communication: daughter.  Disposition Plan: To home when better.   Consultants:   GI.   Procedures:   None.    Antimicrobials: Anti-infectives    None       Subjective: Doing a little better.  Passed gas.  No BM.    Objective: Vitals:   03/27/16 1105 03/27/16 1626 03/27/16 1933 03/28/16 0551  BP: (!) 153/87 (!) 154/95 134/71 135/84  Pulse: (!) 103 77 83 87  Resp: 16 20 16 18   Temp:  98.4 F (36.9 C) 99.1 F (37.3 C) 99.5 F (37.5 C)  TempSrc:  Oral Oral Oral  SpO2:  95% 93% 96%  Weight:      Height:         Intake/Output Summary (Last 24 hours) at 03/28/16 1327 Last data filed at 03/28/16 [id number]  Gross per 24 hour  Intake              720 ml  Output              875 ml  Net             -155 ml   Filed Weights   03/26/16 0341  Weight: 92.9 kg (204 lb 12.9 oz)    Examination:  General exam: Appears calm and comfortable  Respiratory system: Clear to auscultation. Respiratory effort normal. Cardiovascular system: S1 & S2 heard, RRR. No JVD, murmurs, rubs, gallops or clicks. No pedal edema. Gastrointestinal system: Abdomen is distended but better, soft and nontender. No organomegaly or masses felt. Normal bowel sounds heard. Central nervous system: Alert and oriented. No focal neurological deficits. Extremities: Symmetric 5 x 5 power. Skin: No rashes, lesions or ulcers Psychiatry: Judgement and insight appear normal. Mood & affect appropriate.   Data Reviewed: I have personally reviewed following labs and imaging studies  CBC:  Recent Labs Lab 03/25/16 2325 03/26/16 0113 03/27/16 0522  WBC 14.0*  --  23.1*  NEUTROABS 12.1*  --  20.0*  HGB 15.8 16.0 16.3  HCT 46.7 47.0 48.8  MCV 97.1  --  99.0  PLT 146*  --  [id number]*   Basic Metabolic Panel:  Recent Labs Lab 03/26/16 0031 03/26/16 0113  NA 138 139  K 4.0 3.9  CL 98* 101  CO2 26  --   GLUCOSE 131* 125*  BUN 15 14  CREATININE 1.09 1.10  CALCIUM 9.1  --    GFR: Estimated Creatinine Clearance: 59.7 mL/min (by C-G formula based on SCr of 1.1 mg/dL). Liver Function Tests:  Recent Labs Lab 03/26/16 0031  AST 33  ALT 31  ALKPHOS 55  BILITOT 0.8  PROT 7.0  ALBUMIN 4.3    Recent Labs Lab 03/26/16 0031  LIPASE 23     Recent Labs Lab 03/26/16 0508 03/26/16 0923 03/26/16 1510  TROPONINI <0.03 <0.03 <0.03   HbA1C:  Recent Labs  03/26/16 0031  HGBA1C 5.5   Lipid Profile:  Recent Labs  03/26/16 0031  CHOL 202*  HDL 100  LDLCALC 93  TRIG 47  CHOLHDL 2.0   Thyroid Function Tests:  Recent  Labs  03/26/16 0031  TSH 1.306    Radiology Studies: Dg Abd 1 View  Result Date: 03/28/2016 CLINICAL DATA:  Nausea up.  Weakness. EXAM: ABDOMEN - 1 VIEW COMPARISON:  03/26/2016 FINDINGS: Nasogastric tube tip is in the gastric antrum. Gas pattern does not suggest ileus or obstruction. No dilated small bowel loops are seen. Overall abdominal opacity secondary to a large amount of intraabdominal fat as shown by CT. IMPRESSION: Nasogastric tube in the gastric antrum. Gas pattern unremarkable today. Electronically Signed   By: Nelson Chimes M.D.   On: 03/28/2016 08:49   Dg Abd 1 View  Result Date: 03/26/2016 CLINICAL DATA:  Upper abdominal pain. EXAM: ABDOMEN - 1 VIEW COMPARISON:  03/26/2016 0052 hours FINDINGS: Distended loops of small bowel are present in the right hemiabdomen. There is some colonic gas. The stomach is gas-filled but nondistended. There is no obvious free intraperitoneal gas. No pneumatosis. There is contrast within the bladder. IMPRESSION: Persistent distended small bowel loops. They or disproportionate with respect to colonic gas. Developing small bowel obstruction is not excluded. Close follow-up is warranted. Electronically Signed   By: Marybelle Killings M.D.   On: 03/26/2016 14:36   Dg Chest Port 1 View  Result Date: 03/26/2016 CLINICAL DATA:  76 year old male status post NG tube placement. EXAM: PORTABLE CHEST 1 VIEW COMPARISON:  Chest x-ray 03/26/2016. FINDINGS: A nasogastric tube is seen extending into the stomach, however, the tip of the nasogastric tube extends below the lower margin of the image. Lung volumes are low. No consolidative airspace disease. No pleural effusions. Cephalization of the pulmonary vasculature, with diffuse peribronchial cuffing and mild indistinctness of the interstitial markings, suggesting mild interstitial pulmonary edema. Mild cardiomegaly. The patient is rotated to the right on today's exam, resulting in distortion of the mediastinal contours and  reduced diagnostic sensitivity and specificity for mediastinal pathology. 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 IMPRESSION: 1. Nasogastric tube extends into the stomach (tip is below the lower margin of the image). 2. The appearance the chest suggests mild congestive heart failure, as above. Electronically Signed   By: Vinnie Langton M.D.   On: 03/26/2016 18:18    Scheduled Meds: . aspirin EC  325 mg Oral Daily  . atenolol  100 mg Oral Daily  . losartan  50 mg  Oral Daily  . pantoprazole  80 mg Oral BID  . simethicone  80 mg Oral QID   Continuous Infusions: . dextrose 5 % and 0.9% NaCl 50 mL/hr at 03/28/16 0906     LOS: 2 days   Taleyah Hillman, MD [facility].   If 7PM-7AM, please contact night-coverage [email or web address] Password [email or web address] 03/28/2016, 1:27 PM

## 2016-03-28 NOTE — Progress Notes (Signed)
Pt's NG tube clamped per order by Dr. Oneida Alar. Residual 5cc.

## 2016-03-28 NOTE — Progress Notes (Signed)
Patient ID: Peter Howe, male   DOB: Jan 20, 1941, 76 y.o.   MRN: [id number]    Assessment/Plan: ADMITTED JAN 17 WITHABDOMINAL PAIN AND VOMITING AND KUB SUGGESTS POSSIBLE PARTIAL SBO V. ILEUS. CLINICALLY IMPROVED. KUB IMPROVED AFTER NG TUBE COMPRESSION.  PLAN: 1. CLAMP NG TUBE. CHECK RESIDUAL IN 4 HOURS. RESTART TO INTERMITTENT LOW WALL SUCTION IF RESIDUAL > 300 MLS. 2. IF TOLERATES CLAMPED NG TUBE, ADVANCE TO CLEAR OR FULL LIQUIDS IN 3-4 HRS.  3. CONTINUE TO MONITOR SYMPTOMS. 4. AMBULATE WITH ASSISTANCE.     Subjective: Since I last evaluated the patient HE IS PASSING GAS. ABDOMINAL PAIN IS BETTER. NO NAUSEA OR VOMITING. WANTS TO AMBULATE.   Objective: Vital signs in last 24 hours: Vitals:   03/27/16 1933 03/28/16 0551  BP: 134/71 135/84  Pulse: 83 87  Resp: 16 18  Temp: 99.1 F (37.3 C) 99.5 F (37.5 C)   General appearance: alert, cooperative and no distress Resp: clear to auscultation bilaterally Cardio: regular rate and rhythm GI: soft, MILD TENDERNESS x4, NO REBOUND OR GUARDING; bowel sounds normal  Lab Results: NONE  Studies/Results: Dg Abd 1 View  Result Date: 03/28/2016 CLINICAL DATA:  Nausea up.  Weakness. EXAM: ABDOMEN - 1 VIEW COMPARISON:  03/26/2016 FINDINGS: Nasogastric tube tip is in the gastric antrum. Gas pattern does not suggest ileus or obstruction. No dilated small bowel loops are seen. Overall abdominal opacity secondary to a large amount of intraabdominal fat as shown by CT. IMPRESSION: Nasogastric tube in the gastric antrum. Gas pattern unremarkable today. Electronically Signed   By: Nelson Chimes M.D.   On: 03/28/2016 08:49    Medications: I have reviewed the patient's current medications. LAST DOSE OF PRADAXA [id number] @0800 .   LOS: 5 days   Roslyn Else 08/17/2013, 2:23 PM

## 2016-03-29 DIAGNOSIS — I480 Paroxysmal atrial fibrillation: Secondary | ICD-10-CM | POA: Diagnosis not present

## 2016-03-29 DIAGNOSIS — R9431 Abnormal electrocardiogram [ECG] [EKG]: Secondary | ICD-10-CM | POA: Diagnosis not present

## 2016-03-29 DIAGNOSIS — R112 Nausea with vomiting, unspecified: Secondary | ICD-10-CM | POA: Diagnosis not present

## 2016-03-29 DIAGNOSIS — R140 Abdominal distension (gaseous): Secondary | ICD-10-CM | POA: Diagnosis not present

## 2016-03-29 DIAGNOSIS — K56699 Other intestinal obstruction unspecified as to partial versus complete obstruction: Secondary | ICD-10-CM | POA: Diagnosis not present

## 2016-03-29 LAB — OCCULT BLOOD X 1 CARD TO LAB, STOOL: FECAL OCCULT BLD: NEGATIVE

## 2016-03-29 NOTE — Progress Notes (Signed)
Pt pulled NG tube out and does not want it replaced at this time.  He would prefer to see the doctor in the am to see if he really needs to have it replaced.  Pt instructed to report any nausea or vomiting.  Nursing staff to continue to monitor.

## 2016-03-29 NOTE — Discharge Summary (Signed)
Physician Discharge Summary  Peter Howe [id number] DOB: 11-10-1940 DOA: 03/25/2016  PCP: Sherrie Mustache, MD  Admit date: 03/25/2016 Discharge date: 03/29/2016  Admitted From: Home.  Disposition:  Home.   Recommendations for Outpatient Follow-up:  1. Follow up with PCP in 1-2 weeks 2. Follow up with Dr Amedeo Plenty of GI next week.    Home Health: None.  Equipment/Devices: none.  Discharge Condition: Improved:  Had normal BM, able to eat dysphagia III lunch with no problem.  CODE STATUS: FULL CODE.  Diet recommendation: Bland GI diet and advance.   Brief/Interim Summary: Patient was admitted by Dr Lorin Mercy for abdominal pain on Mar 26, 2016.  As per her H and P:  " Peter Howe is a 76 y.o. male with medical history significant of tachy-brady syndrome with a pacemaker, afib on anticoagulation with Pradaxa, and HTN presenting with an episode of severe abdominal pain that radiated into his chest.  Patient started having pain from abdomen into the chest between 5-7pm.  Had a similar presentation in August, just wasn't quite as bad then as today.  He was admitted and it was thought to be MSK in nature.  The pain was epigastric, radiated into substernal regiobn.  Watching tv when it started.  Nausea a couple of times, a few episodes of vomiting with streaks of blood in it.  Has had blood in the stool., supposed to give another specimen next week with Dr. Edrick Oh.  He saw Dr. Amedeo Plenty in November and started Prilosec with improvement prior to today.  +palpitations, "kind of out of rhythm."  BP was 202/101.  Prior stress test in 1/07, low risk nuclear study.  Last Echo appears to have been in 2014, moderate LVH, preserved EF, unable to assess diastolic function.   HOSPITAL COURSE:  76 yo with prior episode of abdominal pain in 8/17, another bout in Dec, and admitted yesterday with abdominal pain and distention.  He has normal BM, and work up in the ER initially was negative including abdominal  CT and abdominal CTA. He had no obstruction at that time, and had no evidence of intestinal angina.  Lipase and LFT were normal. We did cycle his troponins, and they were all negative, but clinically, it had little to do cardiac wise.  His prior HIDA scan was negative with EF of 80 percent. On exam, he has hypertypany and suggests significant gas, and thus on the following day, a KUB confirmed significant gas in the small bowel, can't exclude developing SBO.  At that juncture, GI was consulted.  GI agreed with conservative management, and was thinking he has early obstruction.  He was made NPO, and given IVF.  He was given NGT decompression, and the following day, his distention improved.  He had another KUB which now showed significant improvement.  GI felt that he would benefit outpatient EGD, and had deferred to his GI physician, which is Dr Amedeo Plenty in [place].  The evening of the NGT placement, he took his NGT out himself, and did not want to have it placed back.  Given his improvement, his diet was advanced, and he was able to eventually eating regular diet and had a normal BM.  He has been anxious to go home, and will be discharged to home.  During his stay, he was given PPI as well and IV pain meds.  He will be discharged home today, and follow up with his PCP next week and with Dr Amedeo Plenty, as it was recommended to have  a follow up EGD.  During his stay, Pradaxa was held just in case he needed urgent intervention, eitther EGD or surgery, but he did not require it.  He will be discharged back on his Pradaxa.   Thank you so much for allowing me to participate in his care.  Good Day.    Discharge Diagnoses:  Principal Problem:   Abdominal pain Active Problems:   Essential hypertension   ATRIAL FIBRILLATION   Chest pain   Hyperglycemia   Abnormal EKG   Non-intractable vomiting with nausea   Abdominal distension  Discharge Instructions  Discharge Instructions    Diet - low sodium heart healthy     Complete by:  As directed    Discharge instructions    Complete by:  As directed    Take your medications as instructed.  Resume your blood thinner.  Eat bland GI diet and advance tomorrow.  Follow up with Dr Amedeo Plenty as you were recommended to have an upper endoscopy soon.   Increase activity slowly    Complete by:  As directed      Allergies as of 03/29/2016      Reactions   Penicillins Rash   Has patient had a PCN reaction causing immediate rash, facial/tongue/throat swelling, SOB or lightheadedness with hypotension: No Has patient had a PCN reaction causing severe rash involving mucus membranes or skin necrosis: No Has patient had a PCN reaction that required hospitalization No Has patient had a PCN reaction occurring within the last 10 years: No If all of the above answers are "NO", then may proceed with Cephalosporin use.   Sulfonamide Derivatives Rash      Medication List    TAKE these medications   ALIGN PO Take 1 capsule by mouth daily.   AMBIEN 10 MG tablet Generic drug:  zolpidem Take 5 mg by mouth at bedtime as needed for sleep.   atenolol 100 MG tablet Commonly known as:  TENORMIN Take 100 mg by mouth daily.   losartan 50 MG tablet Commonly known as:  COZAAR Take 50 mg by mouth daily.   multivitamin with minerals Tabs tablet Take 1 tablet by mouth daily.   nitroGLYCERIN 0.4 MG SL tablet Commonly known as:  NITROSTAT Place 1 tablet (0.4 mg total) under the tongue every 5 (five) minutes as needed for chest pain.   omeprazole 40 MG capsule Commonly known as:  PRILOSEC Take 40 mg by mouth daily.   PRADAXA 150 MG Caps capsule Generic drug:  dabigatran TAKE 1 CAPSULE BY MOUTH TWICE DAILY       Allergies  Allergen Reactions  . Penicillins Rash    Has patient had a PCN reaction causing immediate rash, facial/tongue/throat swelling, SOB or lightheadedness with hypotension: No Has patient had a PCN reaction causing severe rash involving mucus membranes or  skin necrosis: No Has patient had a PCN reaction that required hospitalization No Has patient had a PCN reaction occurring within the last 10 years: No If all of the above answers are "NO", then may proceed with Cephalosporin use.   . Sulfonamide Derivatives Rash    Consultations:  GI.    Procedures/Studies: Dg Abd 1 View  Result Date: 03/28/2016 CLINICAL DATA:  Nausea up.  Weakness. EXAM: ABDOMEN - 1 VIEW COMPARISON:  03/26/2016 FINDINGS: Nasogastric tube tip is in the gastric antrum. Gas pattern does not suggest ileus or obstruction. No dilated small bowel loops are seen. Overall abdominal opacity secondary to a large amount of intraabdominal fat as shown  by CT. IMPRESSION: Nasogastric tube in the gastric antrum. Gas pattern unremarkable today. Electronically Signed   By: Nelson Chimes M.D.   On: 03/28/2016 08:49   Dg Abd 1 View  Result Date: 03/26/2016 CLINICAL DATA:  Upper abdominal pain. EXAM: ABDOMEN - 1 VIEW COMPARISON:  03/26/2016 0052 hours FINDINGS: Distended loops of small bowel are present in the right hemiabdomen. There is some colonic gas. The stomach is gas-filled but nondistended. There is no obvious free intraperitoneal gas. No pneumatosis. There is contrast within the bladder. IMPRESSION: Persistent distended small bowel loops. They or disproportionate with respect to colonic gas. Developing small bowel obstruction is not excluded. Close follow-up is warranted. Electronically Signed   By: Marybelle Killings M.D.   On: 03/26/2016 14:36   Dg Chest Port 1 View  Result Date: 03/26/2016 CLINICAL DATA:  76 year old male status post NG tube placement. EXAM: PORTABLE CHEST 1 VIEW COMPARISON:  Chest x-ray 03/26/2016. FINDINGS: A nasogastric tube is seen extending into the stomach, however, the tip of the nasogastric tube extends below the lower margin of the image. Lung volumes are low. No consolidative airspace disease. No pleural effusions. Cephalization of the pulmonary vasculature, with  diffuse peribronchial cuffing and mild indistinctness of the interstitial markings, suggesting mild interstitial pulmonary edema. Mild cardiomegaly. The patient is rotated to the right on today's exam, resulting in distortion of the mediastinal contours and reduced diagnostic sensitivity and specificity for mediastinal pathology. 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 IMPRESSION: 1. Nasogastric tube extends into the stomach (tip is below the lower margin of the image). 2. The appearance the chest suggests mild congestive heart failure, as above. Electronically Signed   By: Vinnie Langton M.D.   On: 03/26/2016 18:18   Dg Chest Portable 1 View  Result Date: 03/26/2016 CLINICAL DATA:  Sudden onset of chest and upper abdominal pain. Vomiting. EXAM: PORTABLE CHEST 1 VIEW COMPARISON:  Radiographs 11/05/2015 FINDINGS: Multi lead right-sided pacemaker remains in place. Cardiomegaly with tortuous thoracic aorta is stable. Indistinct pulmonary vasculature raises concern for pulmonary edema. No pleural fluid. No focal airspace disease. No pneumothorax. IMPRESSION: Cardiomegaly with tortuous thoracic aorta. Indistinct pulmonary vasculature raising concern for pulmonary edema. Electronically Signed   By: Jeb Levering M.D.   On: 03/26/2016 01:09   Dg Abd Portable 1 View  Result Date: 03/26/2016 CLINICAL DATA:  Sudden onset upper abdominal pain. EXAM: PORTABLE ABDOMEN - 1 VIEW COMPARISON:  None. FINDINGS: No evidence of free intra- abdominal air. Mild gaseous gastric distention. No small bowel dilatation. Air in stool throughout the visualized colon. Lower abdomen not included in the field of view. No radiopaque calculi are evident. IMPRESSION: No evidence of free intra-abdominal air. Mild gaseous gastric distension without bowel obstruction. Electronically Signed   By: Jeb Levering M.D.   On: 03/26/2016 01:11    Ct Angio Chest/abd/pel For Dissection W And/or Wo Contrast  Result Date: 03/26/2016 CLINICAL DATA:  Acute onset of chest and abdominal pain. Episode of vomiting. EXAM: CT ANGIOGRAPHY CHEST, ABDOMEN AND PELVIS TECHNIQUE: Multidetector CT imaging through the chest, abdomen and pelvis was performed using the standard protocol during bolus administration of intravenous contrast. Multiplanar reconstructed images and MIPs were obtained and reviewed to evaluate the vascular anatomy. CONTRAST:  100 cc Isovue-300 IV COMPARISON:  CT abdomen 11/05/2015. Chest and abdominal radiographs earlier this day. FINDINGS: CTA CHEST FINDINGS Cardiovascular: Preferential opacification of the thoracic aorta. No evidence of  thoracic aortic aneurysm or dissection. Mild atherosclerosis. No acute aortic syndrome or aortic hematoma. Conventional branching pattern from the aortic arch. Mild multi chamber cardiomegaly. Pacemaker with leads in the right atrium and ventricle. No central pulmonary embolus. No pericardial effusion. Mediastinum/Nodes: No mediastinal or hilar adenopathy. The esophagus is decompressed. Visualized thyroid gland is unremarkable. Lungs/Pleura: No consolidation to suggest pneumonia. Scattered subsegmental atelectasis in both lower lobes. Minimal smooth septal and peribronchial thickening. No pleural fluid. Musculoskeletal: There are no acute or suspicious osseous abnormalities. Degenerative change throughout spine. Review of the MIP images confirms the above findings. CTA ABDOMEN AND PELVIS FINDINGS VASCULAR Aorta: Normal caliber aorta without aneurysm, dissection, vasculitis or significant stenosis. Mild to moderate atherosclerosis. Celiac: Patent without evidence of aneurysm, dissection, vasculitis or significant stenosis. SMA: Patent without evidence of aneurysm, dissection, vasculitis or significant stenosis. Renals: Both renal arteries are patent without evidence of aneurysm, dissection, vasculitis, fibromuscular  dysplasia or significant stenosis. IMA: Patent without evidence of aneurysm, dissection, vasculitis or significant stenosis. Inflow: Patent without evidence of aneurysm, dissection, vasculitis or significant stenosis. Veins: No obvious venous abnormality within the limitations of this arterial phase study. Review of the MIP images confirms the above findings. NON-VASCULAR Hepatobiliary: The liver is prominent size with mild steatosis. No evidence of focal lesion. Gallbladder physiologically distended, no calcified stone. No biliary dilatation. Pancreas: No ductal dilatation or inflammation. Spleen: Normal in size without focal abnormality. Adrenals/Urinary Tract: Normal adrenal glands. No hydronephrosis. Symmetric renal enhancement. Tiny cortical hypodensities in the right kidney are too small to characterize but likely cysts. No perinephric edema. Urinary bladder is distended. Stomach/Bowel: Colonic diverticulosis throughout the entire colon, most prominent in the descending. No acute diverticulitis. Moderate stool burden proximally. No colonic wall thickening or inflammation. No small bowel dilatation. Mild gaseous gastric distention. No gastric wall thickening. Normal appendix. Lymphatic: No adenopathy. Reproductive: Heterogeneous enlarged prostate gland spans 5.9 cm transverse. There is mass effect on the bladder base. Other: Fat within both inguinal canals, right greater than left. Tiny fat containing umbilical hernia. No free air, free fluid, or intra-abdominal fluid collection. Musculoskeletal: There are no acute or suspicious osseous abnormalities. Multilevel degenerative change throughout the lumbar spine. Review of the MIP images confirms the above findings. IMPRESSION: 1. No aortic dissection or acute aortic abnormality. Diffuse atheromatous change. 2. Minimal pulmonary edema, no additional acute chest finding. 3. No acute abnormality in the abdomen or pelvis. 4. Colonic diverticulosis without  diverticulitis. Enlarged prostate gland causing mass effect on the bladder base. Hepatic steatosis. Electronically Signed   By: Jeb Levering M.D.   On: 03/26/2016 02:06       Subjective: Feeling much better.   Discharge Exam: Vitals:   03/29/16 0522 03/29/16 1412  BP: 136/86 120/71  Pulse: 85 92  Resp: 18 18  Temp: 99 F (37.2 C) 99.4 F (37.4 C)   Vitals:   03/28/16 1412 03/28/16 2036 03/29/16 0522 03/29/16 1412  BP: (!) 152/89 131/77 136/86 120/71  Pulse: 79 91 85 92  Resp: 18 18 18 18   Temp: 98.7 F (37.1 C) 99.5 F (37.5 C) 99 F (37.2 C) 99.4 F (37.4 C)  TempSrc: Oral Oral Oral Oral  SpO2: 95% 94% 96% 98%  Weight:      Height:        General: Pt is alert, awake, not in acute distress Cardiovascular: RRR, S1/S2 +, no rubs, no gallops Respiratory: CTA bilaterally, no wheezing, no rhonchi Abdominal: Soft, NT, ND, bowel sounds + Extremities: no edema, no cyanosis  The results of significant diagnostics from this hospitalization (including imaging, microbiology, ancillary and laboratory) are listed below for reference.     Labs: BNP (last 3 results)  Recent Labs  03/26/16 0031  BNP [id number]*   Basic Metabolic Panel:  Recent Labs Lab 03/26/16 0031 03/26/16 0113  NA 138 139  K 4.0 3.9  CL 98* 101  CO2 26  --   GLUCOSE 131* 125*  BUN 15 14  CREATININE 1.09 1.10  CALCIUM 9.1  --    Liver Function Tests:  Recent Labs Lab 03/26/16 0031  AST 33  ALT 31  ALKPHOS 55  BILITOT 0.8  PROT 7.0  ALBUMIN 4.3    Recent Labs Lab 03/26/16 0031  LIPASE 23   CBC:  Recent Labs Lab 03/25/16 2325 03/26/16 0113 03/27/16 0522  WBC 14.0*  --  23.1*  NEUTROABS 12.1*  --  20.0*  HGB 15.8 16.0 16.3  HCT 46.7 47.0 48.8  MCV 97.1  --  99.0  PLT 146*  --  129*   Cardiac Enzymes:  Recent Labs Lab 03/26/16 0508 03/26/16 0923 03/26/16 1510  TROPONINI <0.03 <0.03 <0.03   Urinalysis    Component Value Date/Time   COLORURINE YELLOW  03/26/2016 0201   APPEARANCEUR CLEAR 03/26/2016 0201   LABSPEC 1.023 03/26/2016 0201   PHURINE 7.0 03/26/2016 0201   GLUCOSEU 50 (A) 03/26/2016 0201   HGBUR SMALL (A) 03/26/2016 0201   BILIRUBINUR NEGATIVE 03/26/2016 0201   KETONESUR 5 (A) 03/26/2016 0201   PROTEINUR 30 (A) 03/26/2016 0201   UROBILINOGEN 0.2 08/15/2012 1119   NITRITE NEGATIVE 03/26/2016 0201   LEUKOCYTESUR NEGATIVE 03/26/2016 0201    Time coordinating discharge: Over 30 minutes  SIGNED:  Orvan Falconer, MD FACP [facility] 03/29/2016, 4:52 PM   If 7PM-7AM, please contact night-coverage [email or web address] Password [email or web address]

## 2016-03-29 NOTE — Progress Notes (Signed)
Discharge instructions read to patient and his family. All verbalized understanding of instructions.  Discharged to home with family 

## 2016-03-29 NOTE — Progress Notes (Addendum)
Patient ID: Peter Howe, male   DOB: 09-26-1940, 76 y.o.   MRN: [id number]   Assessment/Plan: ADMITTED WITH ABDOMINAL PAIN, NAUSEA, AND VOMITING. KUB SHOWED POSSIBLE SBO V. ILEUS. NG CLAMPED YESTERDAY AND CAME OUT LAST NIGHT. CLINICALLY IMPROVED. TOLERATING CLEAR LIQUIDS.  PLAN: 1. ADVANCE TO DYSPHAGIA 3 DIET. 2. CONTINUE TO MONITOR SYMPTOMS. IF DEVELOPS NAUSEA/VOMITING/ABDOMINAL PAIN, OBTAIN CT ABD/PELVIS W/ IV AND ORAL CONTRAST IF POSSIBLE.    Subjective: Since I last evaluated the patient HE HAD HIS NG CLAMPED/PULLED OUT. NO NAUSEA OR VOMITING OR ABDOMINAL PAIN.   Objective: Vital signs in last 24 hours: Vitals:   03/28/16 2036 03/29/16 0522  BP: 131/77 136/86  Pulse: 91 85  Resp: 18 18  Temp: 99.5 F (37.5 C) 99 F (37.2 C)     General appearance: alert, cooperative and no distress Resp: clear to auscultation bilaterally Cardio: regular rate and rhythm GI: soft, non-tender; bowel sounds normal; no masses,  no organomegaly  Lab Results:  NONE   Studies/Results: No results found.  Medications: I have reviewed the patient's current medications.   LOS: 5 days   Barney Drain 08/17/2013, 2:23 PM

## 2016-04-04 ENCOUNTER — Other Ambulatory Visit: Payer: Self-pay | Admitting: Cardiovascular Disease

## 2016-07-13 ENCOUNTER — Encounter: Payer: Self-pay | Admitting: Internal Medicine

## 2016-07-13 DIAGNOSIS — I495 Sick sinus syndrome: Secondary | ICD-10-CM | POA: Diagnosis not present

## 2016-09-11 ENCOUNTER — Ambulatory Visit (INDEPENDENT_AMBULATORY_CARE_PROVIDER_SITE_OTHER): Payer: Medicare HMO | Admitting: Internal Medicine

## 2016-09-11 ENCOUNTER — Encounter: Payer: Self-pay | Admitting: Internal Medicine

## 2016-09-11 VITALS — BP 122/76 | HR 57 | Ht 64.0 in | Wt 196.0 lb

## 2016-09-11 DIAGNOSIS — M79604 Pain in right leg: Secondary | ICD-10-CM | POA: Diagnosis not present

## 2016-09-11 DIAGNOSIS — M79605 Pain in left leg: Secondary | ICD-10-CM

## 2016-09-11 DIAGNOSIS — I495 Sick sinus syndrome: Secondary | ICD-10-CM | POA: Diagnosis not present

## 2016-09-11 DIAGNOSIS — I10 Essential (primary) hypertension: Secondary | ICD-10-CM | POA: Diagnosis not present

## 2016-09-11 DIAGNOSIS — I482 Chronic atrial fibrillation: Secondary | ICD-10-CM | POA: Diagnosis not present

## 2016-09-11 DIAGNOSIS — I4821 Permanent atrial fibrillation: Secondary | ICD-10-CM

## 2016-09-11 NOTE — Patient Instructions (Signed)
Medication Instructions:  Continue all current medications.  Labwork: none  Testing/Procedures:  Your physician has requested that you have an ankle brachial index (ABI). During this test an ultrasound and blood pressure cuff are used to evaluate the arteries that supply the arms and legs with blood. Allow thirty minutes for this exam. There are no restrictions or special instructions.  Your physician has requested that you have a lower extremity arterial exercise duplex. During this test, exercise and ultrasound are used to evaluate arterial blood flow in the legs. Allow one hour for this exam. There are no restrictions or special instructions.  Office will contact with results via phone or letter.    Follow-Up: Your physician wants you to follow up in:  1 year.  You will receive a reminder letter in the mail one-two months in advance.  If you don't receive a letter, please call our office to schedule the follow up appointment   Any Other Special Instructions Will Be Listed Below (If Applicable).  If you need a refill on your cardiac medications before your next appointment, please call your pharmacy.

## 2016-09-11 NOTE — Addendum Note (Signed)
Addended by: Laurine Blazer on: 09/11/2016 09:21 AM   Modules accepted: Orders

## 2016-09-11 NOTE — Progress Notes (Signed)
PCP: Dione Housekeeper, MD Primary Cardiologist:  Peter Howe is a 76 y.o. male who presents today for routine electrophysiology followup.  Since last being seen in our clinic, the patient reports doing very well.  Today, he denies symptoms of palpitations, chest pain, shortness of breath,  lower extremity edema, dizziness, presyncope, or syncope.  + sharp leg pain (R>L) at night.  Does not feel like leg cramps.  Does not occur with exertion.  The patient is otherwise without complaint today.   Past Medical History:  Diagnosis Date  . Hypertension   . Overweight(278.02)    obesity  . Permanent atrial fibrillation ([place])   . Tachycardia-bradycardia [facility])    s/p PPM   Past Surgical History:  Procedure Laterality Date  . PACEMAKER INSERTION  03/28/03   [employer]-PPM    ROS- all systems are reviewed and negative except as per HPI above  Current Outpatient Prescriptions  Medication Sig Dispense Refill  . atenolol (TENORMIN) 100 MG tablet Take 100 mg by mouth daily.      Marland Kitchen losartan (COZAAR) 50 MG tablet Take 50 mg by mouth daily.    . Multiple Vitamin (MULTIVITAMIN WITH MINERALS) TABS tablet Take 1 tablet by mouth daily.    . nitroGLYCERIN (NITROSTAT) 0.4 MG SL tablet Place 1 tablet (0.4 mg total) under the tongue every 5 (five) minutes as needed for chest pain. 25 tablet 3  . omeprazole (PRILOSEC) 40 MG capsule Take 40 mg by mouth daily.    Marland Kitchen PRADAXA 150 MG CAPS capsule TAKE 1 CAPSULE BY MOUTH TWICE DAILY 60 capsule 3  . Probiotic Product (ALIGN PO) Take 1 capsule by mouth daily.    Marland Kitchen zolpidem (AMBIEN) 10 MG tablet Take 5 mg by mouth at bedtime as needed for sleep.      No current facility-administered medications for this visit.     Physical Exam: Vitals:   09/11/16 0832  BP: 122/76  Pulse: (!) 57  SpO2: 97%  Weight: 196 lb (88.9 kg)  Height: 5\' 4"  (1.626 m)    GEN- The patient is well appearing, alert and oriented x 3 today.   Head- normocephalic, atraumatic Eyes-  Sclera  clear, conjunctiva pink Ears- hearing intact Oropharynx- clear Lungs- Clear to ausculation bilaterally, normal work of breathing Chest- pacemaker pocket is well healed Heart- Regular rate and rhythm, no murmurs, rubs or gallops, PMI not laterally displaced GI- soft, NT, ND, + BS Extremities- no clubbing, cyanosis, + trace R leg edema, diminished DP/PT pulses bilaterally  Pacemaker interrogation- reviewed in detail today,  See PACEART report  Assessment and Plan:  1. Symptomatic bradycardia Normal pacemaker function See Pace Art report No changes today  2. permanent afib On pradaxa PCP following CrCl  3. HTN Stable No change required today  4. Leg pain Given prior tobacco history and diminished pulses on exam, will order ABIs and arterial dopplers.  Return in 1 year  [name] MD, [facility] 09/11/2016 9:07 AM

## 2016-09-14 ENCOUNTER — Other Ambulatory Visit: Payer: Self-pay | Admitting: Internal Medicine

## 2016-09-14 DIAGNOSIS — I739 Peripheral vascular disease, unspecified: Secondary | ICD-10-CM

## 2016-09-14 DIAGNOSIS — R0989 Other specified symptoms and signs involving the circulatory and respiratory systems: Secondary | ICD-10-CM

## 2016-09-15 LAB — CUP PACEART INCLINIC DEVICE CHECK
Brady Statistic RV Percent Paced: 26 %
Date Time Interrogation Session: 20180710104158
Implantable Lead Implant Date: 19980120
Lead Channel Pacing Threshold Amplitude: 0.5 V
Lead Channel Pacing Threshold Pulse Width: 0.4 ms
MDC IDC LEAD IMPLANT DT: 20050119
MDC IDC LEAD LOCATION: 753859
MDC IDC LEAD LOCATION: 753860
MDC IDC MSMT LEADCHNL RV IMPEDANCE VALUE: 615 Ohm
MDC IDC MSMT LEADCHNL RV SENSING INTR AMPL: 16.6 mV
MDC IDC PG IMPLANT DT: 20050119
MDC IDC PG SERIAL: 927381
MDC IDC SET LEADCHNL RV PACING AMPLITUDE: 2.5 V
MDC IDC SET LEADCHNL RV PACING PULSEWIDTH: 0.4 ms
MDC IDC SET LEADCHNL RV SENSING SENSITIVITY: 2 mV
Pulse Gen Model: 5376

## 2016-09-30 ENCOUNTER — Ambulatory Visit: Payer: Medicare HMO

## 2016-09-30 DIAGNOSIS — I739 Peripheral vascular disease, unspecified: Secondary | ICD-10-CM

## 2016-09-30 DIAGNOSIS — R0989 Other specified symptoms and signs involving the circulatory and respiratory systems: Secondary | ICD-10-CM

## 2016-10-02 ENCOUNTER — Telehealth: Payer: Self-pay | Admitting: *Deleted

## 2016-10-02 NOTE — Telephone Encounter (Signed)
-----   Message from [name], MD sent at 10/02/2016  4:50 PM EDT ----- Results reviewed.  Please inform pt of result. I will route to primary care also.  Does not appear to have significant vascular occlusion

## 2016-10-13 NOTE — Telephone Encounter (Signed)
Patient informed. 

## 2016-12-14 DIAGNOSIS — I495 Sick sinus syndrome: Secondary | ICD-10-CM | POA: Diagnosis not present

## 2017-01-06 ENCOUNTER — Other Ambulatory Visit: Payer: Self-pay | Admitting: Cardiovascular Disease

## 2017-02-04 ENCOUNTER — Other Ambulatory Visit: Payer: Self-pay | Admitting: Internal Medicine

## 2017-02-27 ENCOUNTER — Emergency Department (HOSPITAL_COMMUNITY)
Admission: EM | Admit: 2017-02-27 | Discharge: 2017-02-27 | Disposition: A | Discharge status: Home / Self Care | Payer: Medicare HMO | Attending: Emergency Medicine | Admitting: Emergency Medicine

## 2017-02-27 ENCOUNTER — Emergency Department (HOSPITAL_COMMUNITY): Payer: Medicare HMO

## 2017-02-27 ENCOUNTER — Other Ambulatory Visit: Payer: Self-pay

## 2017-02-27 ENCOUNTER — Encounter (HOSPITAL_COMMUNITY): Payer: Self-pay | Admitting: Emergency Medicine

## 2017-02-27 DIAGNOSIS — R1012 Left upper quadrant pain: Secondary | ICD-10-CM | POA: Diagnosis not present

## 2017-02-27 DIAGNOSIS — Z87891 Personal history of nicotine dependence: Secondary | ICD-10-CM | POA: Diagnosis not present

## 2017-02-27 DIAGNOSIS — Z950 Presence of cardiac pacemaker: Secondary | ICD-10-CM | POA: Diagnosis not present

## 2017-02-27 DIAGNOSIS — R109 Unspecified abdominal pain: Secondary | ICD-10-CM | POA: Diagnosis present

## 2017-02-27 DIAGNOSIS — R1013 Epigastric pain: Secondary | ICD-10-CM | POA: Insufficient documentation

## 2017-02-27 DIAGNOSIS — K5792 Diverticulitis of intestine, part unspecified, without perforation or abscess without bleeding: Secondary | ICD-10-CM | POA: Insufficient documentation

## 2017-02-27 DIAGNOSIS — I10 Essential (primary) hypertension: Secondary | ICD-10-CM | POA: Diagnosis not present

## 2017-02-27 DIAGNOSIS — Z79899 Other long term (current) drug therapy: Secondary | ICD-10-CM | POA: Diagnosis not present

## 2017-02-27 LAB — CBC
HCT: 49.5 % (ref 39.0–52.0)
HEMOGLOBIN: 17.3 g/dL — AB (ref 13.0–17.0)
MCH: 34.2 pg — AB (ref 26.0–34.0)
MCHC: 34.9 g/dL (ref 30.0–36.0)
MCV: 97.8 fL (ref 78.0–100.0)
PLATELETS: 147 10*3/uL — AB (ref 150–400)
RBC: 5.06 MIL/uL (ref 4.22–5.81)
RDW: 13.3 % (ref 11.5–15.5)
WBC: 14.9 10*3/uL — ABNORMAL HIGH (ref 4.0–10.5)

## 2017-02-27 LAB — COMPREHENSIVE METABOLIC PANEL
ALBUMIN: 4.2 g/dL (ref 3.5–5.0)
ALK PHOS: 58 U/L (ref 38–126)
ALT: 26 U/L (ref 17–63)
ANION GAP: 9 (ref 5–15)
AST: 28 U/L (ref 15–41)
BILIRUBIN TOTAL: 1.6 mg/dL — AB (ref 0.3–1.2)
BUN: 9 mg/dL (ref 6–20)
CALCIUM: 9.3 mg/dL (ref 8.9–10.3)
CO2: 28 mmol/L (ref 22–32)
CREATININE: 1.01 mg/dL (ref 0.61–1.24)
Chloride: 98 mmol/L — ABNORMAL LOW (ref 101–111)
GFR calc Af Amer: >60 mL/min (ref >60)
GFR calc non Af Amer: >60 mL/min (ref >60)
GLUCOSE: 102 mg/dL — AB (ref 65–99)
Potassium: 4.1 mmol/L (ref 3.5–5.1)
Sodium: 135 mmol/L (ref 135–145)
TOTAL PROTEIN: 6.8 g/dL (ref 6.5–8.1)

## 2017-02-27 LAB — URINALYSIS, ROUTINE W REFLEX MICROSCOPIC
BILIRUBIN URINE: NEGATIVE
Bacteria, UA: NONE SEEN
GLUCOSE, UA: NEGATIVE mg/dL
Ketones, ur: 20 mg/dL — AB
Leukocytes, UA: NEGATIVE
NITRITE: NEGATIVE
PH: 7 (ref 5.0–8.0)
Protein, ur: 30 mg/dL — AB
SPECIFIC GRAVITY, URINE: 1.006 (ref 1.005–1.030)
Squamous Epithelial / LPF: NONE SEEN

## 2017-02-27 LAB — CK: Total CK: 96 U/L (ref 49–397)

## 2017-02-27 LAB — LIPASE, BLOOD: Lipase: 30 U/L (ref 11–51)

## 2017-02-27 MED ORDER — SODIUM CHLORIDE 0.9 % IV BOLUS (SEPSIS)
1000.0000 mL | Freq: Once | INTRAVENOUS | Status: AC
  Administered 2017-02-27: 1000 mL via INTRAVENOUS

## 2017-02-27 MED ORDER — CIPROFLOXACIN HCL 500 MG PO TABS: 500.0000 mg | ORAL_TABLET | Freq: Two times a day (BID) | ORAL | 0 refills | Status: DC

## 2017-02-27 MED ORDER — METRONIDAZOLE 500 MG PO TABS: 500.0000 mg | ORAL_TABLET | Freq: Two times a day (BID) | ORAL | 0 refills | Status: DC

## 2017-02-27 MED ORDER — MORPHINE SULFATE (PF) 4 MG/ML IV SOLN
2.0000 mg | Freq: Once | INTRAVENOUS | Status: AC
  Administered 2017-02-27: 2 mg via INTRAVENOUS
  Filled 2017-02-27: qty 1

## 2017-02-27 MED ORDER — IOPAMIDOL (ISOVUE-300) INJECTION 61%
INTRAVENOUS | Status: AC
  Administered 2017-02-27: 100 mL via INTRAVENOUS
  Filled 2017-02-27: qty 100

## 2017-02-27 MED ORDER — ONDANSETRON HCL 4 MG/2ML IJ SOLN
4.0000 mg | Freq: Once | INTRAMUSCULAR | Status: AC
  Administered 2017-02-27: 4 mg via INTRAVENOUS
  Filled 2017-02-27: qty 2

## 2017-02-27 NOTE — ED Provider Notes (Signed)
[place] EMERGENCY DEPARTMENT Provider Note   CSN: [id number] Arrival date & time: 02/27/17  1402     History   Chief Complaint Chief Complaint  Patient presents with  . Abdominal Pain    right    HPI Peter Howe is a 76 y.o. male.  76 yo M with a chief complaint of abdominal pain.  Patient has a history of the same.  Michela Pitcher it started on his right side just above the iliac crest.  Radiates towards the low back.  Saw his family physician was found to have blood in his urine was thought to be kidney stones and he was discharged home with Flomax.  Since then the patient's pain has persisted and now it has increased to the epigastrium and left upper quadrant.  States he had pain like this in the past when he had to be admitted to the hospital.  Denies chest pain or shortness of breath.  Has had some lower extremity edema but not worse than his baseline.  Denies nausea vomiting or diarrhea.  Denies fevers.  Has been able to eat and drink but has not felt like it today.   The history is provided by the patient.  Abdominal Pain   This is a recurrent problem. The current episode started 2 days ago. The problem occurs constantly. The problem has been gradually worsening. The pain is associated with an unknown factor. The pain is located in the generalized abdominal region. The quality of the pain is sharp and shooting. The pain is at a severity of 8/10. The pain is moderate. Pertinent negatives include fever, diarrhea, nausea, vomiting, headaches, arthralgias and myalgias. Nothing aggravates the symptoms. Nothing relieves the symptoms.    Past Medical History:  Diagnosis Date  . Hypertension   . Overweight(278.02)    obesity  . Permanent atrial fibrillation ([place])   . 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 Abdominal distension   . Hyperglycemia 03/26/2016  . Abnormal EKG   . Non-intractable vomiting  with nausea   . Chest pain 11/05/2015  . Abdominal pain 11/05/2015  . Shortness of breath 08/31/2012  . ATRIAL FIBRILLATION 01/02/2010  . OVERWEIGHT/OBESITY 12/05/2008  . Essential hypertension 12/05/2008  . 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  . PACEMAKER, PERMANENT 12/05/2008    Past Surgical History:  Procedure Laterality Date  . PACEMAKER INSERTION  03/28/03   St. Jude-PPM       Home Medications    Prior to Admission medications   Medication Sig Start Date End Date Taking? Authorizing Provider  acetaminophen (TYLENOL) 325 MG tablet Take 650 mg by mouth every 6 (six) hours as needed for mild pain.   Yes [provider]  albuterol (PROVENTIL HFA) 108 (90 Base) MCG/ACT inhaler Inhale 1 puff into the lungs every 4 (four) hours as needed for wheezing. 04/06/16  Yes [provider]  atenolol (TENORMIN) 100 MG tablet Take 100 mg by mouth daily.     Yes [provider]  HYDROcodone-acetaminophen (NORCO/VICODIN) 5-325 MG tablet Take 1 tablet by mouth every 4 (four) hours as needed for pain. 02/24/17 02/24/18 Yes [provider]  losartan (COZAAR) 50 MG tablet Take 50 mg by mouth daily.   Yes [provider]  Multiple Vitamin (MULTIVITAMIN WITH MINERALS) TABS tablet Take 1 tablet by mouth daily.   Yes [provider]  nitroGLYCERIN (NITROSTAT) 0.4 MG SL tablet Place 1  tablet (0.4 mg total) under the tongue every 5 (five) minutes as needed for chest pain. 03/26/15  Yes Herminio Commons, MD  omeprazole (PRILOSEC) 40 MG capsule Take 40 mg by mouth daily.   Yes [provider]  PRADAXA 150 MG CAPS capsule TAKE 1 CAPSULE BY MOUTH TWICE DAILY Patient taking differently: TAKE 1 CAPSULE (150 MG) BY MOUTH TWICE DAILY 01/07/17  Yes Herminio Commons, MD  tamsulosin (FLOMAX) 0.4 MG CAPS capsule Take 0.4 mg by mouth daily. 02/17/17  Yes [provider]  Vitamins/Minerals TABS Take 1 tablet by mouth daily.   Yes  [provider]  zolpidem (AMBIEN) 10 MG tablet Take 5 mg by mouth at bedtime as needed for sleep.    Yes [provider]  ciprofloxacin (CIPRO) 500 MG tablet Take 1 tablet (500 mg total) by mouth 2 (two) times daily. 02/27/17   Deno Etienne, DO  metroNIDAZOLE (FLAGYL) 500 MG tablet Take 1 tablet (500 mg total) by mouth 2 (two) times daily. 02/27/17   Deno Etienne, DO    Family History Family History  Problem Relation Age of Onset  . Cancer Mother 64  . CAD Father 56    Social History Social History   Tobacco Use  . Smoking status: Former Smoker    Packs/day: 1.00    Years: 30.00    Pack years: 30.00    Types: Cigarettes    Start date: 03/30/1955    Last attempt to quit: 03/09/1993    Years since quitting: 23.9  . Smokeless tobacco: Never Used  Substance Use Topics  . Alcohol use: Yes    Alcohol/week: 0.0 oz    Comment: 0-3 beers daily with rare excess  . Drug use: No     Allergies   Penicillins and Sulfonamide derivatives   Review of Systems Review of Systems  Constitutional: Negative for chills and fever.  HENT: Negative for congestion and facial swelling.   Eyes: Negative for discharge and visual disturbance.  Respiratory: Negative for shortness of breath.   Cardiovascular: Negative for chest pain and palpitations.  Gastrointestinal: Positive for abdominal pain. Negative for diarrhea, nausea and vomiting.  Musculoskeletal: Negative for arthralgias and myalgias.  Skin: Negative for color change and rash.  Neurological: Negative for tremors, syncope and headaches.  Psychiatric/Behavioral: Negative for confusion and dysphoric mood.     Physical Exam Updated Vital Signs BP 138/86 (BP Location: Right Arm)   Pulse 88   Temp 99.3 F (37.4 C) (Oral)   Resp 16   Ht 5\' 4"  (1.626 m)   Wt 89.4 kg (197 lb)   SpO2 97%   BMI 33.81 kg/m   Physical Exam  Constitutional: He is oriented to person, place, and time. He appears well-developed and  well-nourished.  HENT:  Head: Normocephalic and atraumatic.  Eyes: EOM are normal. Pupils are equal, round, and reactive to light.  Neck: Normal range of motion. Neck supple. No JVD present.  Cardiovascular: Normal rate and regular rhythm. Exam reveals no gallop and no friction rub.  No murmur heard. Pulmonary/Chest: No respiratory distress. He has no wheezes.  Abdominal: He exhibits distension ( Tympanitic to percussion). There is generalized tenderness. There is no rebound and no guarding.    Musculoskeletal: Normal range of motion.  Neurological: He is alert and oriented to person, place, and time.  Skin: No rash noted. No pallor.  Psychiatric: He has a normal mood and affect. His behavior is normal.  Nursing note and vitals reviewed.  ED Treatments / Results  Labs (all labs ordered are listed, but only abnormal results are displayed) Labs Reviewed  COMPREHENSIVE METABOLIC PANEL - Abnormal; Notable for the following components:      Result Value   Chloride 98 (*)    Glucose, Bld 102 (*)    Total Bilirubin 1.6 (*)    All other components within normal limits  CBC - Abnormal; Notable for the following components:   WBC 14.9 (*)    Hemoglobin 17.3 (*)    MCH 34.2 (*)    Platelets 147 (*)    All other components within normal limits  URINALYSIS, ROUTINE W REFLEX MICROSCOPIC - Abnormal; Notable for the following components:   Hgb urine dipstick MODERATE (*)    Ketones, ur 20 (*)    Protein, ur 30 (*)    All other components within normal limits  LIPASE, BLOOD  CK    EKG  EKG Interpretation None       Radiology Ct Abdomen Pelvis W Contrast  Result Date: 02/27/2017 CLINICAL DATA:  Initial evaluation for acute abdominal distension, pelvic pain radiating into the lower quadrants bilaterally. EXAM: CT ABDOMEN AND PELVIS WITH CONTRAST TECHNIQUE: Multidetector CT imaging of the abdomen and pelvis was performed using the standard protocol following bolus administration  of intravenous contrast. CONTRAST:  162mL ISOVUE-300 IOPAMIDOL (ISOVUE-300) INJECTION 61% COMPARISON:  Prior radiograph from earlier the same day. FINDINGS: Lower chest: Mild scattered atelectatic changes present within the visualized lung bases. Visualized lungs are otherwise clear. Cardiac pacemaker electrodes partially visualized. Visualized heart mildly enlarged. Hepatobiliary: Liver demonstrates a normal contrast enhanced appearance. Gallbladder is contracted with mucosal enhancement. Hazy inflammatory stranding within the pericholecystic fat favored to be secondary to the inflammatory process in the adjacent colon. No biliary dilatation. Pancreas: Pancreas within normal limits. Spleen: Spleen within normal limits. Adrenals/Urinary Tract: Adrenal glands are normal. Kidneys equal in size with symmetric enhancement. No nephrolithiasis, hydronephrosis, or focal enhancing renal mass. No hydroureter. Bladder within normal limits. Stomach/Bowel: Small hiatal hernia. Stomach within normal limits. No evidence for bowel obstruction. Appendix normal. Hazy inflammatory stranding about the colon and several diverticulae at the level of the hepatic flexure, suggesting acute diverticulitis/colitis. No evidence for perforation or other complication. No other acute inflammatory changes seen about the bowels. Vascular/Lymphatic: Normal intravascular enhancement seen throughout the intra-abdominal aorta. Moderate aorto bi-iliac atherosclerotic disease. Mesenteric vessels patent at their origins. No aneurysm. No adenopathy. Reproductive: Prostate normal. Other: Bilateral fat containing inguinal hernias noted. Additional small fat containing paraumbilical hernia. No free air or fluid. Musculoskeletal: No acute osseus abnormality. No worrisome lytic or blastic osseous lesions. Multilevel facet arthropathy noted within the lower lumbar spine. IMPRESSION: 1. Acute inflammatory stranding about the colon at the level of the hepatic  flexure, consistent with acute diverticulitis and/or colitis. Adjacent gallbladder is contracted with hazy pericholecystic stranding, felt to be likely reactive/secondary. No evidence for perforation or other complication. 2. No other acute intra- abdominal or pelvic process. 3. Moderate atherosclerosis. Electronically Signed   By: Jeannine Boga M.D.   On: 02/27/2017 20:31    Procedures Procedures (including critical care time)  Medications Ordered in ED Medications  morphine 4 MG/ML injection 2 mg (2 mg Intravenous Given 02/27/17 1859)  ondansetron (ZOFRAN) injection 4 mg (4 mg Intravenous Given 02/27/17 1858)  sodium chloride 0.9 % bolus 1,000 mL (1,000 mLs Intravenous New Bag/Given 02/27/17 1900)  iopamidol (ISOVUE-300) 61 % injection (100 mLs Intravenous Contrast Given 02/27/17 1945)     Initial Impression /  Assessment and Plan / ED Course  I have reviewed the triage vital signs and the nursing notes.  Pertinent labs & imaging results that were available during my care of the patient were reviewed by me and considered in my medical decision making (see chart for details).     76 yo M with a chief complaint of abdominal pain.  Last time patient was here with the same he was diagnosed with a possible small bowel obstruction.  He was given an NG tube and had improvement and was transitioned to a regular diet and discharged home.  Will obtain a CT scan.  Labs are unremarkable.  He has a leukocytosis which appears to be chronic for him.  CT with diverticulitis. ? Finding about the gallbladder. Repeat RUQ abdominal exam with no tenderness, no murphys.   Start on abx.    8:56 PM:  I have discussed the diagnosis/risks/treatment options with the patient and family and believe the pt to be eligible for discharge home to follow-up with PCP. We also discussed returning to the ED immediately if new or worsening sx occur. We discussed the sx which are most concerning (e.g., sudden worsening  pain, fever, inability to tolerate by mouth) that necessitate immediate return. Medications administered to the patient during their visit and any new prescriptions provided to the patient are listed below.  Medications given during this visit Medications  morphine 4 MG/ML injection 2 mg (2 mg Intravenous Given 02/27/17 1859)  ondansetron (ZOFRAN) injection 4 mg (4 mg Intravenous Given 02/27/17 1858)  sodium chloride 0.9 % bolus 1,000 mL (1,000 mLs Intravenous New Bag/Given 02/27/17 1900)  iopamidol (ISOVUE-300) 61 % injection (100 mLs Intravenous Contrast Given 02/27/17 1945)     The patient appears reasonably screen and/or stabilized for discharge and I doubt any other medical condition or other [facility] requiring further screening, evaluation, or treatment in the ED at this time prior to discharge.    Final Clinical Impressions(s) / ED Diagnoses   Final diagnoses:  Diverticulitis    ED Discharge Orders        Ordered    ciprofloxacin (CIPRO) 500 MG tablet  2 times daily     02/27/17 2053    metroNIDAZOLE (FLAGYL) 500 MG tablet  2 times daily     02/27/17 2053       Deno Etienne, DO 02/27/17 2056

## 2017-02-27 NOTE — ED Triage Notes (Signed)
Pt. Stated, I've had stomach pain last night all across my stomach.  I did have blood in my urine a couple of weeks ago.

## 2017-03-30 ENCOUNTER — Other Ambulatory Visit: Payer: Self-pay | Admitting: Gastroenterology

## 2017-03-30 ENCOUNTER — Ambulatory Visit
Admission: RE | Admit: 2017-03-30 | Discharge: 2017-03-30 | Disposition: A | Discharge status: Home / Self Care | Payer: Medicare HMO | Source: Ambulatory Visit | Attending: Gastroenterology | Admitting: Gastroenterology

## 2017-03-30 DIAGNOSIS — R10816 Epigastric abdominal tenderness: Secondary | ICD-10-CM

## 2017-03-30 MED ORDER — IOPAMIDOL (ISOVUE-300) INJECTION 61%
100.0000 mL | Freq: Once | INTRAVENOUS | Status: AC | PRN
  Administered 2017-03-30: 100 mL via INTRAVENOUS

## 2017-06-04 DIAGNOSIS — Z950 Presence of cardiac pacemaker: Secondary | ICD-10-CM | POA: Insufficient documentation

## 2017-06-04 DIAGNOSIS — R1013 Epigastric pain: Secondary | ICD-10-CM | POA: Insufficient documentation

## 2017-06-08 DIAGNOSIS — R748 Abnormal levels of other serum enzymes: Secondary | ICD-10-CM | POA: Insufficient documentation

## 2017-06-25 ENCOUNTER — Encounter: Payer: Self-pay | Admitting: Internal Medicine

## 2017-06-25 DIAGNOSIS — I495 Sick sinus syndrome: Secondary | ICD-10-CM | POA: Diagnosis not present

## 2017-08-18 IMAGING — CT CT ABDOMEN W/ CM
2 of 5 series · 14 of 46 positions shown, 16 images · IV contrast (iopamidol)
Comparison: 08/15/2012 CT abdomen/ pelvis.

CLINICAL DATA: Upper abdominal pain with nausea and vomiting.

EXAM:
CT ABDOMEN WITH CONTRAST
TECHNIQUE: Multidetector CT imaging of the abdomen was performed using the
standard protocol following bolus administration of intravenous
contrast.
CONTRAST:  100mL 8HGJDD-833 IOPAMIDOL (8HGJDD-833) INJECTION 61%

[Series 2: routine abd pel with · axial · 0.90mm/px · z∈[-288,-72]mm · 11 of 51 slices shown, 13 images]
[im 4/51  soft-tissue]
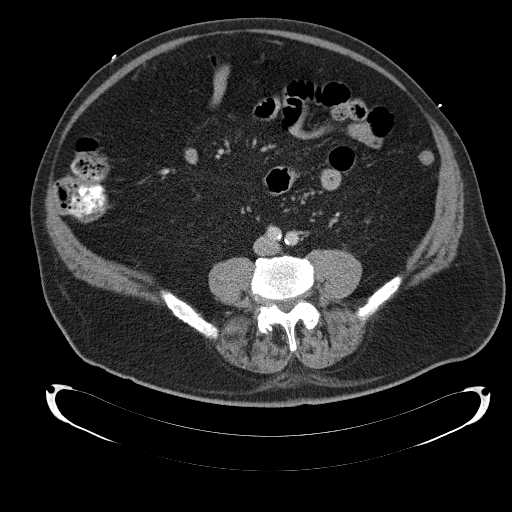
[im 4/51  bone]
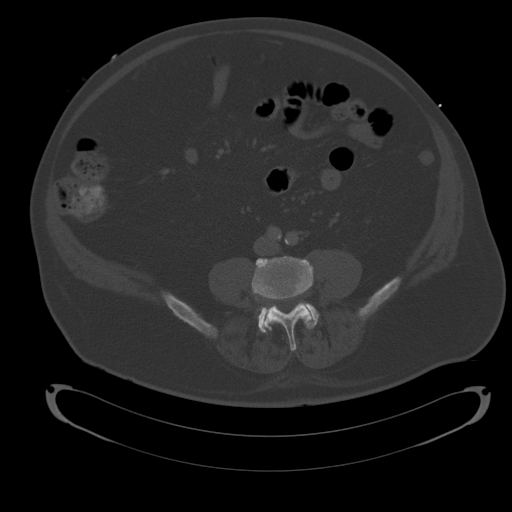
[im 8/51  soft-tissue]
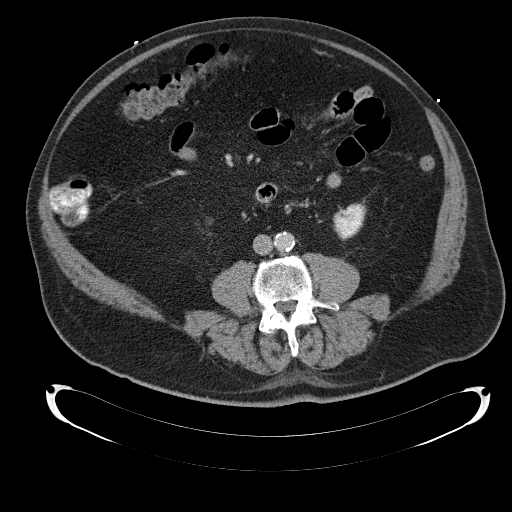
[im 12/51  soft-tissue]
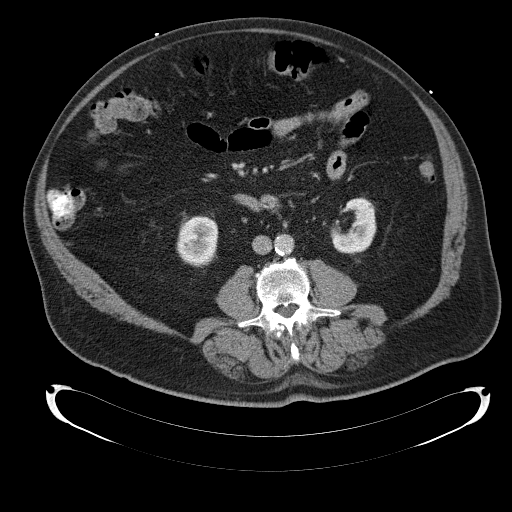
[im 16/51  soft-tissue]
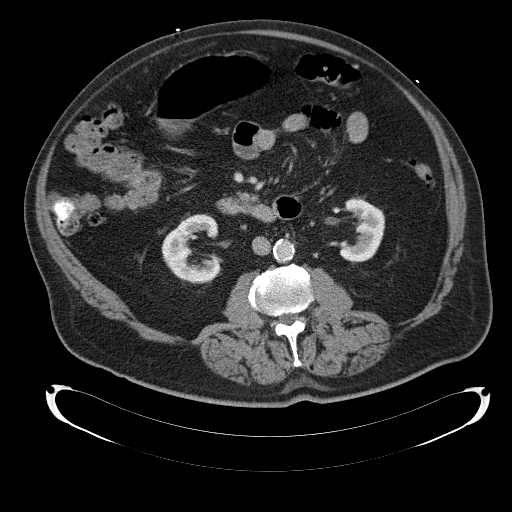
[im 20/51  soft-tissue]
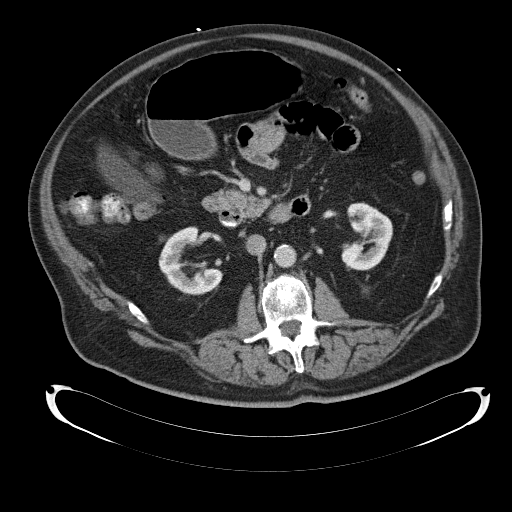
[im 27/51  soft-tissue]
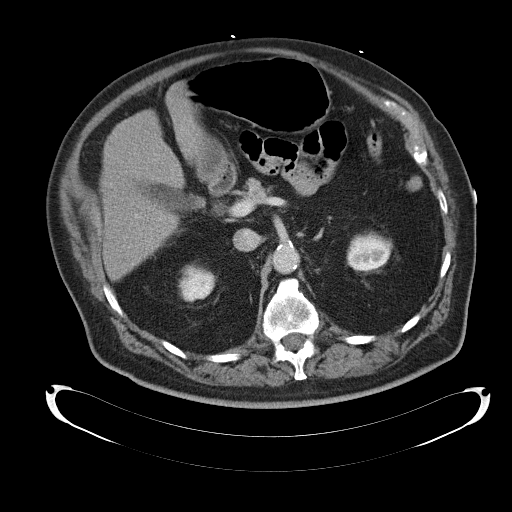
[im 31/51  soft-tissue]
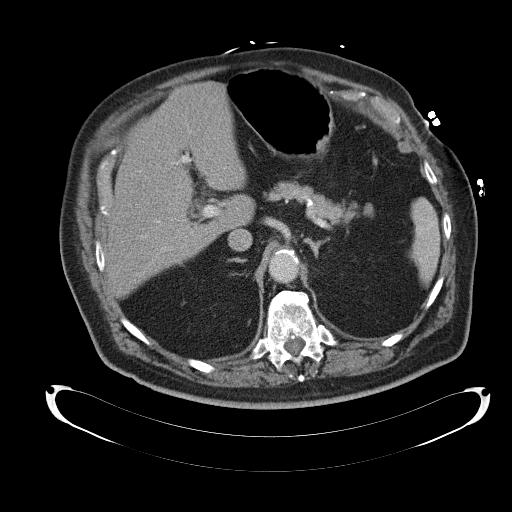
[im 35/51  soft-tissue]
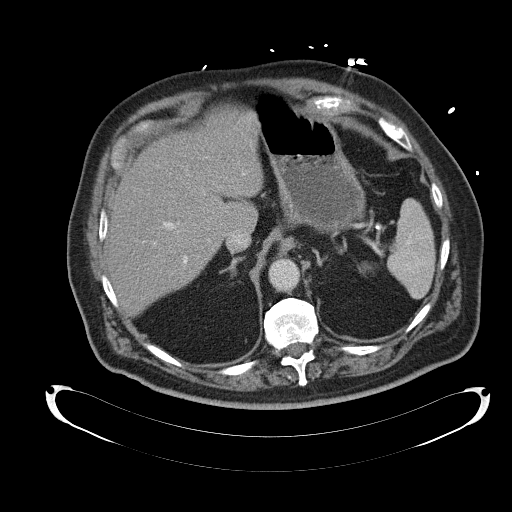
[im 39/51  soft-tissue]
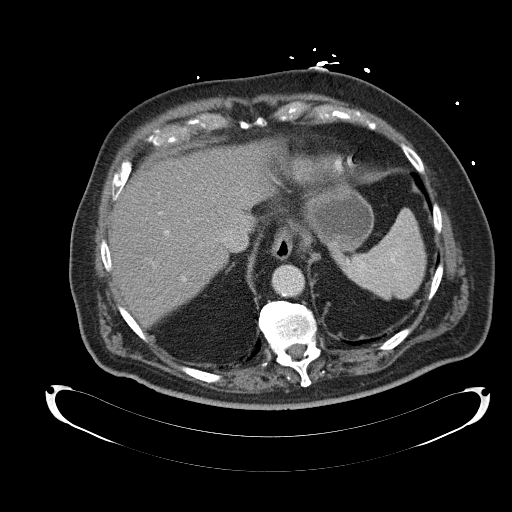
[im 39/51  bone]
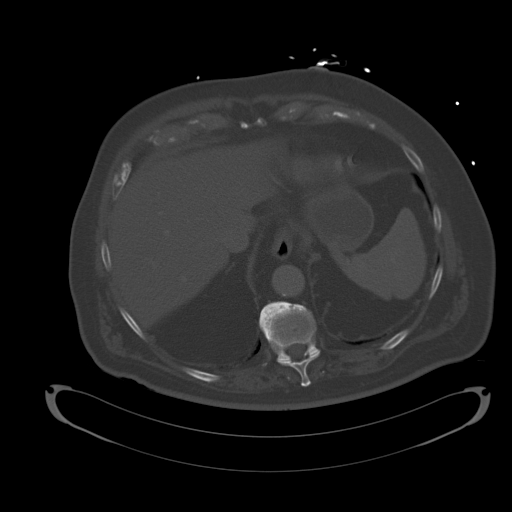
[im 43/51  soft-tissue]
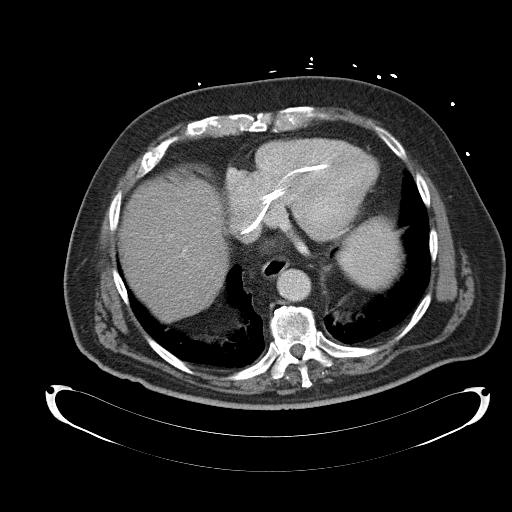
[im 47/51  soft-tissue]
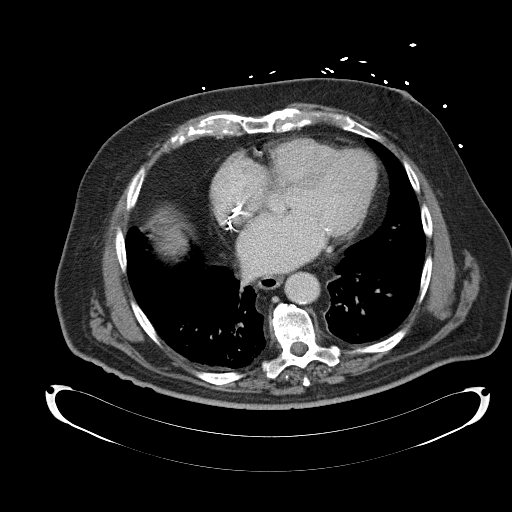

[Series 4: coronal · coronal · 0.52mm/px · 3 of 170 slices shown]
[im 57/170  soft-tissue]
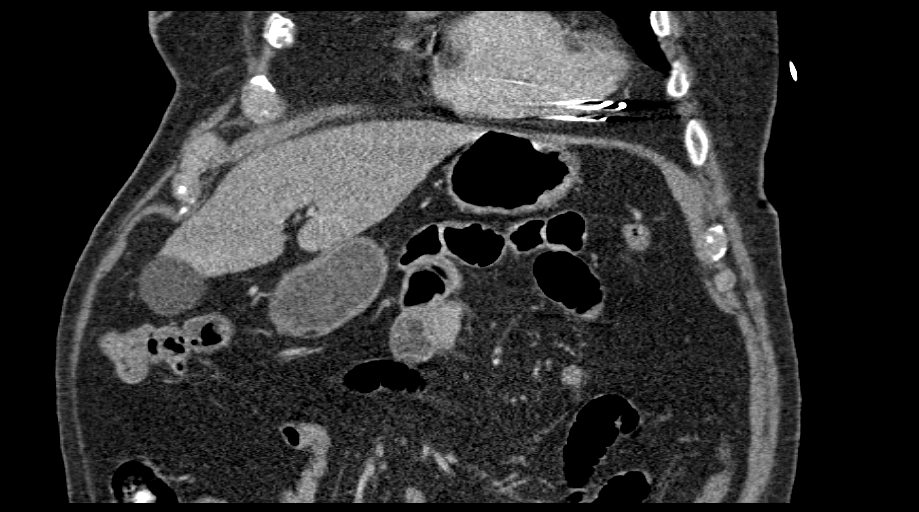
[im 76/170  soft-tissue]
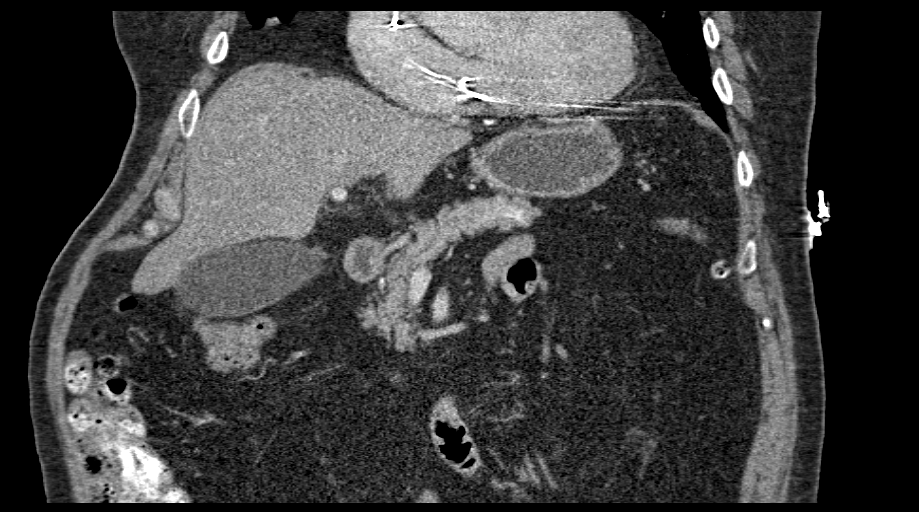
[im 94/170  soft-tissue]
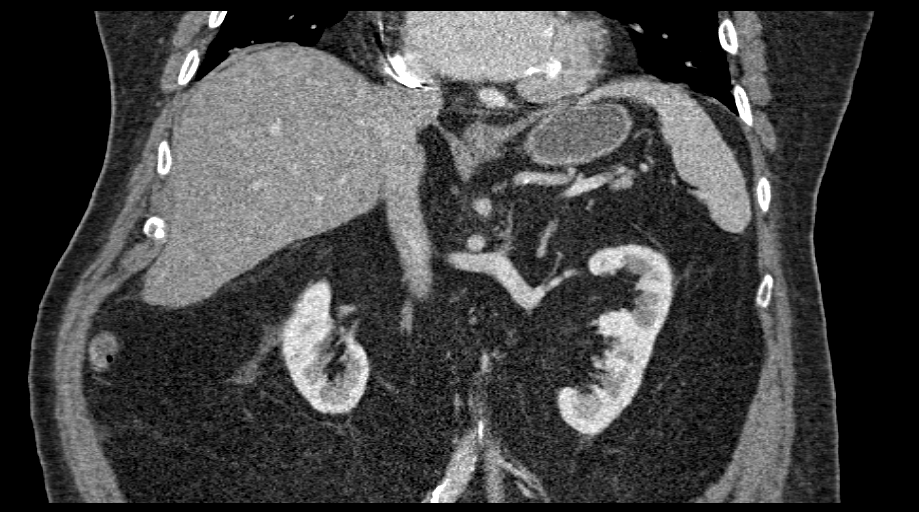

[14 of 46 positions shown; findings below may reference images not displayed]

FINDINGS: Lower chest: No significant pulmonary nodules or acute consolidative
airspace disease. Three lead pacemaker is noted with lead tips in
the right atrium and right ventricular apex.

Hepatobiliary: Diffuse hepatic steatosis. No liver mass. Tiny 3 mm
radiodensity in the dependent gallbladder likely represents a tiny
gallstone. No gallbladder wall thickening or pericholecystic fluid.
No biliary ductal dilatation. Stable small periampullary duodenum
diverticulum.

Pancreas: Normal, with no mass or duct dilation.

Spleen: Normal size. No mass.

Adrenals/Urinary Tract: Normal adrenals. Normal kidneys with no
hydronephrosis and no renal mass.

Stomach/Bowel: Grossly normal stomach. Visualized small and large
bowel is normal caliber, with no bowel wall thickening. Mild
diverticulosis scattered throughout the visualized colon.

Vascular/Lymphatic: Atherosclerotic nonaneurysmal abdominal aorta.
Patent portal, splenic, hepatic and renal veins. No pathologically
enlarged lymph nodes in the abdomen.

Other: No pneumoperitoneum, ascites or focal fluid collection.

Musculoskeletal: No aggressive appearing focal osseous lesions.
Moderate thoracolumbar spondylosis.
IMPRESSION: 1. No acute abnormality mild colonic diverticulosis, with no
evidence of acute diverticulitis.
2. Probable cholelithiasis, with no evidence of acute cholecystitis.
3. Additional findings include aortic atherosclerosis and diffuse
hepatic steatosis.

## 2017-09-03 ENCOUNTER — Other Ambulatory Visit: Payer: Self-pay | Admitting: Cardiovascular Disease

## 2017-09-10 ENCOUNTER — Encounter: Payer: Self-pay | Admitting: Internal Medicine

## 2017-09-10 ENCOUNTER — Ambulatory Visit (INDEPENDENT_AMBULATORY_CARE_PROVIDER_SITE_OTHER): Payer: Medicare HMO | Admitting: Internal Medicine

## 2017-09-10 VITALS — BP 120/72 | HR 68 | Ht 64.0 in | Wt 195.8 lb

## 2017-09-10 DIAGNOSIS — I10 Essential (primary) hypertension: Secondary | ICD-10-CM

## 2017-09-10 DIAGNOSIS — I495 Sick sinus syndrome: Secondary | ICD-10-CM | POA: Diagnosis not present

## 2017-09-10 DIAGNOSIS — I480 Paroxysmal atrial fibrillation: Secondary | ICD-10-CM

## 2017-09-10 LAB — CUP PACEART INCLINIC DEVICE CHECK
Implantable Lead Implant Date: 19980120
Implantable Lead Implant Date: 20050119
Implantable Lead Location: 753859
Implantable Lead Location: 753860
Lead Channel Impedance Value: 619 Ohm
Lead Channel Pacing Threshold Pulse Width: 0.4 ms
Lead Channel Setting Pacing Amplitude: 2.5 V
MDC IDC MSMT LEADCHNL RV PACING THRESHOLD AMPLITUDE: 0.5 V
MDC IDC MSMT LEADCHNL RV SENSING INTR AMPL: 16.6 mV
MDC IDC PG IMPLANT DT: 20050119
MDC IDC PG SERIAL: 927381
MDC IDC SESS DTM: 20190705125323
MDC IDC SET LEADCHNL RV PACING PULSEWIDTH: 0.4 ms
MDC IDC SET LEADCHNL RV SENSING SENSITIVITY: 2 mV

## 2017-09-10 MED ORDER — RIVAROXABAN 20 MG PO TABS: 20.0000 mg | ORAL_TABLET | Freq: Every day | ORAL | 0 refills | Status: DC

## 2017-09-10 MED ORDER — RIVAROXABAN 20 MG PO TABS: 20.0000 mg | ORAL_TABLET | Freq: Every day | ORAL | 11 refills | Status: DC

## 2017-09-10 NOTE — Patient Instructions (Addendum)
Medication Instructions:   Stop Pradaxa.  Begin Xarelto 20mg  daily.  Continue all current medications.  Labwork:  BMET, CBC - orders given today.  Office will contact with results via phone or letter.    Testing/Procedures: none  Follow-Up: Your physician wants you to follow up in:  1 year.  You will receive a reminder letter in the mail one-two months in advance.  If you don't receive a letter, please call our office to schedule the follow up appointment. - Dr. Rayann Heman    Any Other Special Instructions Will Be Listed Below (If Applicable). Your physician wants you to follow up in: 6 months.  You will receive a reminder letter in the mail one-two months in advance.  If you don't receive a letter, please call our office to schedule the follow up appointment.  - device only.   If you need a refill on your cardiac medications before your next appointment, please call your pharmacy.

## 2017-09-10 NOTE — Progress Notes (Signed)
PCP: Dione Housekeeper, MD   Primary EP:  Dr Melton Alar is a 77 y.o. male who presents today for routine electrophysiology followup.  Since last being seen in our clinic, the patient reports doing very well.  Today, he denies symptoms of palpitations, chest pain, shortness of breath,  lower extremity edema, dizziness, presyncope, or syncope.  + rare postural dizziness.  He did have a transient period yesterday while driving where he could not remember where he was.  This was a first event.  He had missed a dose of pradaxa.  Had also been in the heat all day playing golf.   The patient is otherwise without complaint today.   Past Medical History:  Diagnosis Date  . Hypertension   . Overweight(278.02)    obesity  . Permanent atrial fibrillation ([place])   . 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 PACEMAKER INSERTION  03/28/03   St. Jude-PPM    ROS- all systems are reviewed and negative except as per HPI above  Current Outpatient Medications  Medication Sig Dispense Refill  . acetaminophen (TYLENOL) 325 MG tablet Take 650 mg by mouth every 6 (six) hours as needed for mild pain.    Marland Kitchen atenolol (TENORMIN) 100 MG tablet Take 100 mg by mouth daily.      Marland Kitchen losartan (COZAAR) 50 MG tablet Take 50 mg by mouth daily.    . Multiple Vitamin (MULTIVITAMIN WITH MINERALS) TABS tablet Take 1 tablet by mouth daily.    . nitroGLYCERIN (NITROSTAT) 0.4 MG SL tablet Place 1 tablet (0.4 mg total) under the tongue every 5 (five) minutes as needed for chest pain. 25 tablet 3  . omeprazole (PRILOSEC) 40 MG capsule Take 40 mg by mouth as needed.     Marland Kitchen PRADAXA 150 MG CAPS capsule TAKE 1 CAPSULE BY MOUTH TWICE DAILY 60 capsule 0  . zolpidem (AMBIEN) 10 MG tablet Take 5 mg by mouth at bedtime as needed for sleep.      No current facility-administered medications for this visit.     Physical Exam: Vitals:   09/10/17 0827  BP: 120/72    Pulse: 68  SpO2: 97%  Weight: 195 lb 12.8 oz (88.8 kg)  Height: 5\' 4"  (1.626 m)    GEN- The patient is well appearing, alert and oriented x 3 today.   Head- normocephalic, atraumatic Eyes-  Sclera clear, conjunctiva pink Ears- hearing intact Oropharynx- clear Lungs- Clear to ausculation bilaterally, normal work of breathing Chest- pacemaker pocket is well healed Heart- irregular rate and rhythm  GI- soft, NT, ND, + BS Extremities- no clubbing, cyanosis, or edema Neuro- CNII-XII intact, strength/sensation intact, FNF normal  Pacemaker interrogation- reviewed in detail today,  See PACEART report   Assessment and Plan:  1. Symptomatic tachycardia- bradycardia  Normal pacemaker function See Pace Art report No changes today  2. Permanent afib On pradaxa, but frequently forgets to take his second dose during the day. We discussed this at length.  He feels that a once daily medicine would be beneficial for him. Stop pradaxa and start xarelto 20mg  daily Bmet, cbc today PCP to follow labs going forward per patient preference.  3. HTN Stable No change required today  4. Neuro/ transient confusion yesterday He did have a transient period yesterday while driving where he could not remember where he was.  This was a first event.  He had missed a dose of  pradaxa.  Had also been in the heat all day playing golf.  I have advised heat avoidance and adequate hydration.  No focal neuro changes or persistent symptoms currently.  Switch pradaxa to xarelto as above and encouraged compliance. He should follow-up with PCP for any additional episodes.  6-12 months to ERI.  Will return to see EP device nurse in 6 months  [name] MD, [facility] 09/10/2017 8:40 AM

## 2017-09-10 NOTE — Addendum Note (Signed)
Addended by: Laurine Blazer on: 09/10/2017 09:04 AM   Modules accepted: Orders

## 2018-03-14 ENCOUNTER — Ambulatory Visit (INDEPENDENT_AMBULATORY_CARE_PROVIDER_SITE_OTHER): Payer: Medicare HMO | Admitting: Nurse Practitioner

## 2018-03-14 ENCOUNTER — Encounter: Payer: Self-pay | Admitting: Nurse Practitioner

## 2018-03-14 VITALS — BP 132/74 | HR 68 | Ht 64.0 in | Wt 194.0 lb

## 2018-03-14 DIAGNOSIS — I4821 Permanent atrial fibrillation: Secondary | ICD-10-CM

## 2018-03-14 LAB — CUP PACEART INCLINIC DEVICE CHECK
Implantable Lead Implant Date: 20050119
Implantable Lead Location: 753859
MDC IDC LEAD IMPLANT DT: 19980120
MDC IDC LEAD LOCATION: 753860
MDC IDC PG IMPLANT DT: 20050119
MDC IDC PG SERIAL: 927381
MDC IDC SESS DTM: 20200106091945
Pulse Gen Model: 5376

## 2018-03-14 NOTE — Patient Instructions (Signed)
Medication Instructions:   Your physician recommends that you continue on your current medications as directed. Please refer to the Current Medication list given to you today.   If you need a refill on your cardiac medications before your next appointment, please call your pharmacy.   Lab work:  NONE ORDERED  TODAY  If you have labs (blood work) drawn today and your tests are completely normal, you will receive your results only by: Marland Kitchen MyChart Message (if you have MyChart) OR . A paper copy in the mail If you have any lab test that is abnormal or we need to change your treatment, we will call you to review the results.  Testing/Procedures:  NONE ORDERED  TODAY   Follow-Up: At [facility], you and your health needs are our priority.  As part of our continuing mission to provide you with exceptional heart care, we have created designated Provider Care Teams.  These Care Teams include your primary Cardiologist (physician) and Advanced Practice Providers (APPs -  Physician Assistants and Nurse Practitioners) who all work together to provide you with the care you need, when you need it. You will need a follow up appointment in 3 months.  Please call our office 2 months in advance to schedule this appointment.  You may see [name], MD in [facility]    Any Other Special Instructions Will Be Listed Below (If Applicable).

## 2018-03-14 NOTE — Progress Notes (Signed)
Pacemaker check in clinic. Normal device function. Thresholds, sensing, impedances consistent with previous measurements. Device programmed to maximize longevity. Device programmed at appropriate safety margins. Histogram distribution appropriate for patient activity level. Device programmed to optimize intrinsic conduction. Estimated longevity 3-6 months. Follow up with Dr Rayann Heman in [place] in 3 months

## 2018-06-14 ENCOUNTER — Telehealth (INDEPENDENT_AMBULATORY_CARE_PROVIDER_SITE_OTHER): Payer: Medicare HMO | Admitting: Internal Medicine

## 2018-06-14 ENCOUNTER — Encounter: Payer: Self-pay | Admitting: Internal Medicine

## 2018-06-14 ENCOUNTER — Telehealth: Payer: Self-pay

## 2018-06-14 ENCOUNTER — Other Ambulatory Visit: Payer: Self-pay

## 2018-06-14 ENCOUNTER — Telehealth: Payer: Self-pay | Admitting: Internal Medicine

## 2018-06-14 VITALS — BP 138/74

## 2018-06-14 DIAGNOSIS — Z7901 Long term (current) use of anticoagulants: Secondary | ICD-10-CM

## 2018-06-14 DIAGNOSIS — I10 Essential (primary) hypertension: Secondary | ICD-10-CM

## 2018-06-14 DIAGNOSIS — I4821 Permanent atrial fibrillation: Secondary | ICD-10-CM

## 2018-06-14 DIAGNOSIS — Z952 Presence of prosthetic heart valve: Secondary | ICD-10-CM

## 2018-06-14 DIAGNOSIS — I495 Sick sinus syndrome: Secondary | ICD-10-CM

## 2018-06-14 DIAGNOSIS — R001 Bradycardia, unspecified: Secondary | ICD-10-CM | POA: Diagnosis not present

## 2018-06-14 NOTE — Telephone Encounter (Signed)
Encounter not needed

## 2018-06-14 NOTE — Telephone Encounter (Signed)
New Message   Peter Howe is calling to report an abnormal Pacemaker Test  Please call

## 2018-06-14 NOTE — Telephone Encounter (Signed)
Attempted to contact [employer]. Received automated message at main number that "due to unforseen circumstances, we are unable to take calls at this time." LMOVM at [employer] extension requesting call back to my direct number

## 2018-06-14 NOTE — Progress Notes (Signed)
Electrophysiology TeleHealth Note   Due to national recommendations of social distancing due to COVID 19, an audio/video telehealth visit is felt to be most appropriate for this patient at this time.  See MyChart message from today for the patient's consent to telehealth for [facility].   Date:  06/14/2018   ID:  Peter Howe, DOB 1941-01-31, MRN [id number]  Location: patient's home  Provider location: [address], [place]  Evaluation Performed: Follow-up visit  PCP:  Joette Catching, MD  Electrophysiologist:  Dr Johney Frame  Chief Complaint:  htn  History of Present Illness:    Peter Howe is a 78 y.o. male who presents via audio/video conferencing for a telehealth visit today.  Since last being seen in our clinic, the patient reports doing very well.  Today, he denies symptoms of palpitations, chest pain, shortness of breath,  lower extremity edema, dizziness, presyncope, or syncope.  The patient is otherwise without complaint today.  The patient denies symptoms of fevers, chills, cough, or new SOB worrisome for COVID 19.  Past Medical History:  Diagnosis Date  . Hypertension   . Overweight(278.02)    obesity  . Permanent atrial fibrillation   . 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 PACEMAKER INSERTION  03/28/03   St. Jude-PPM    Current Outpatient Medications  Medication Sig Dispense Refill  . acetaminophen (TYLENOL) 325 MG tablet Take 650 mg by mouth every 6 (six) hours as needed for mild pain.    Marland Kitchen atenolol (TENORMIN) 100 MG tablet Take 100 mg by mouth daily.      Marland Kitchen losartan (COZAAR) 50 MG tablet Take 50 mg by mouth daily.    . Multiple Vitamin (MULTIVITAMIN WITH MINERALS) TABS tablet Take 1 tablet by mouth daily.    . nitroGLYCERIN (NITROSTAT) 0.4 MG SL tablet Place 1 tablet (0.4 mg total) under the tongue every 5 (five) minutes as needed for chest pain. 25 tablet 3  . rivaroxaban  (XARELTO) 20 MG TABS tablet Take 1 tablet (20 mg total) by mouth daily with supper. 30 tablet 11  . zolpidem (AMBIEN) 10 MG tablet Take 5 mg by mouth at bedtime as needed for sleep.      No current facility-administered medications for this visit.     Allergies:   Penicillins and Sulfonamide derivatives   Social History:  The patient  reports that he quit smoking about 25 years ago. His smoking use included cigarettes. He started smoking about 63 years ago. He has a 30.00 pack-year smoking history. He has never used smokeless tobacco. He reports current alcohol use. He reports that he does not use drugs.   Family History:  The patient's  family history includes CAD (age of onset: 17) in his father; Cancer (age of onset: 26) in his mother.   ROS:  Please see the history of present illness.   All other systems are personally reviewed and negative.    Exam:    Vital Signs:  BP 138/74   Well appearing, alert and conversant, regular work of breathing,  good skin color Eyes- anicteric, neuro- grossly intact, skin- no apparent rash or lesions or cyanosis, mouth- oral mucosa is pink   Labs/Other Tests and Data Reviewed:    Recent Labs: No results found for requested labs within last 8760 hours.   Wt Readings from Last 3 Encounters:  03/14/18 194 lb (88 kg)  09/10/17 195  lb 12.8 oz (88.8 kg)  02/27/17 197 lb (89.4 kg)     Other studies personally reviewed: Additional studies/ records that were reviewed today include: my prior office visit, prior device interrogations  Review of the above records today demonstrates: as above Prior radiographs: abdominal CT 03/30/17- reviewed, aortic atherosclerosis   TTM sent today is pending from the company.   ASSESSMENT & PLAN:    1.  Symptomatic bradycardia His pacemaker is approaching ERI.  We will obtain TTM to check on his battery status. I would like to defer generator change until after COVID 19 if able.  2. Permanent afib On  xarleto Rate controlled  3. HTN Stable No change required today  4. COVID 19 screen The patient denies symptoms of COVID 19 at this time.  The importance of social distancing was discussed today.  Follow-up:  3 months unless TTMs reveal ERI in the interim. Next remote: monthly TTMs  Current medicines are reviewed at length with the patient today.   The patient does not have concerns regarding his medicines.  The following changes were made today:  none  Labs/ tests ordered today include:  No orders of the defined types were placed in this encounter.  Patient Risk:  after full review of this patients clinical status, I feel that they are at moderate risk at this time.  Today, I have spent 15 minutes with the patient with telehealth technology discussing pacemaker longevity and afib .    Randolm Idol, MD  06/14/2018 3:03 PM     [facility] [address] [place] [address] [phone] (office) [phone] (fax)

## 2018-06-14 NOTE — Telephone Encounter (Signed)
Attempted to call pt to request fax and details about report. No answer.

## 2018-06-14 NOTE — Telephone Encounter (Signed)
Spoke with pt regarding  MyChart video visit on 06/14/18. Pt stated he is unable check BP, pulse, and is unable to upload EKG. Pt questions and concerns were address.

## 2018-06-15 NOTE — Telephone Encounter (Signed)
[employer] phones still down. Will see if report was faxed.

## 2018-06-15 NOTE — Telephone Encounter (Addendum)
Faxed TTM report reviewed, device noted to be nearing ERI, decrease in magnet rate noted. Current magnet rate 90, ERI magnet rate 86.3. Routed to Dr. Rayann Heman for review.

## 2018-06-20 ENCOUNTER — Encounter: Payer: Medicare HMO | Admitting: Internal Medicine

## 2018-08-24 ENCOUNTER — Other Ambulatory Visit: Payer: Self-pay

## 2018-08-24 ENCOUNTER — Telehealth: Payer: Self-pay | Admitting: Internal Medicine

## 2018-08-24 ENCOUNTER — Ambulatory Visit (INDEPENDENT_AMBULATORY_CARE_PROVIDER_SITE_OTHER): Payer: Medicare HMO | Admitting: *Deleted

## 2018-08-24 VITALS — BP 129/81 | HR 61

## 2018-08-24 DIAGNOSIS — Z950 Presence of cardiac pacemaker: Secondary | ICD-10-CM | POA: Diagnosis not present

## 2018-08-24 DIAGNOSIS — I4821 Permanent atrial fibrillation: Secondary | ICD-10-CM | POA: Diagnosis not present

## 2018-08-24 LAB — CUP PACEART INCLINIC DEVICE CHECK
Battery Impedance: 31800 Ohm
Battery Remaining Longevity: 0 mo
Battery Voltage: 2.6 V
Brady Statistic RV Percent Paced: 42 %
Date Time Interrogation Session: 20200617150608
Implantable Lead Implant Date: 19980120
Implantable Lead Implant Date: 20050119
Implantable Lead Location: 753859
Implantable Lead Location: 753860
Implantable Pulse Generator Implant Date: 20050119
Lead Channel Impedance Value: 566 Ohm
Lead Channel Pacing Threshold Amplitude: 0.5 V
Lead Channel Pacing Threshold Pulse Width: 0.4 ms
Lead Channel Sensing Intrinsic Amplitude: 16.6 mV
Lead Channel Setting Pacing Amplitude: 2.5 V
Lead Channel Setting Pacing Pulse Width: 0.4 ms
Lead Channel Setting Sensing Sensitivity: 2 mV
Pulse Gen Model: 5376
Pulse Gen Serial Number: 927381

## 2018-08-24 NOTE — Telephone Encounter (Signed)
New message   Patient states that he thinks that his pacemaker batteries are running out. He states that he does not have any energy and feels dizzy. Please call.

## 2018-08-24 NOTE — Telephone Encounter (Signed)
Pt c/o fatigue x1 week and dizziness with activity x1 week. Pt scheduled for appt in [facility] office 08/24/18 to have device checked in DC clinic.

## 2018-08-24 NOTE — Progress Notes (Signed)
Pacemaker check in clinic, add-on for battery check. Normal device function. Threshold, sensing, impedances consistent with previous measurements. Device programmed to maximize longevity. Permanent AF, +Xarelto. No episode triggers enabled. Device programmed at appropriate safety margins. Histogram distribution appropriate for patient activity level. Device programmed to optimize intrinsic conduction. Estimated longevity <3 months. Patient enrolled in TTM's with [employer]. Patient education completed. ROV with JA on 09/12/18.

## 2018-08-26 ENCOUNTER — Telehealth: Payer: Self-pay | Admitting: *Deleted

## 2018-08-26 NOTE — Telephone Encounter (Signed)
Per Dr. Rayann Heman, patient can keep f/u on 09/12/18 at [facility] office, or reschedule for an August appointment in [place]. PPM interrogated in [facility] on 08/24/18, remaining longevity <3 months, not pacer-dependent. Discussed options with patient. He reports he would rather be seen in [place]. Agreeable to appointment on 10/14/18 at 12:00pm. Pt agrees to call back sooner with any concerns.

## 2018-09-05 ENCOUNTER — Other Ambulatory Visit: Payer: Self-pay | Admitting: Internal Medicine

## 2018-09-06 NOTE — Telephone Encounter (Signed)
Prescription refill request for Xarelto received.   Last office visit: Dr. Rayann Heman (06-14-2018) Weight: 88.8kg (09-10-2017) Age: 78 y.o. Scr: 1.26 (09-14-2017) CrCl: 61 ml/min  Prescription refill sent.

## 2018-09-12 ENCOUNTER — Encounter: Payer: Medicare HMO | Admitting: Internal Medicine

## 2018-10-12 ENCOUNTER — Telehealth: Payer: Self-pay | Admitting: *Deleted

## 2018-10-12 NOTE — Telephone Encounter (Signed)
Noted patient's appointments on 09/12/18 and 10/14/18 were canceled due to DC appointment on 6/17 and MD schedule change. PPM battery as of 08/24/18 was estimated at <3 months remaining, battery impedance 31.6kohms. Patient is not pacer-dependent (Vp 42% at last check). Upcoming f/u with Dr. Rayann Heman in [place] on 11/11/18. Will offer additional DC appointment for battery check in the interim.  LMOVM requesting call back to DC. Gave direct DC number for return call.

## 2018-10-14 ENCOUNTER — Encounter: Payer: Medicare HMO | Admitting: Internal Medicine

## 2018-10-14 NOTE — Telephone Encounter (Signed)
Pt agreed to an appt on 11/01/2018 at 10:00 AM.

## 2018-10-31 ENCOUNTER — Telehealth: Payer: Self-pay

## 2018-10-31 NOTE — Telephone Encounter (Signed)
    COVID-19 Pre-Screening Questions:  . In the past 7 to 10 days have you had a cough,  shortness of breath, headache, congestion, fever (100 or greater) body aches, chills, sore throat, or sudden loss of taste or sense of smell? No . Have you been around anyone with known Covid 19. No . Have you been around anyone who is awaiting Covid 19 test results in the past 7 to 10 days? No . Have you been around anyone who has been exposed to Covid 19, or has mentioned symptoms of Covid 19 within the past 7 to 10 days? No  If you have any concerns/questions about symptoms patients report during screening (either on the phone or at threshold). Contact the provider seeing the patient or DOD for further guidance.  If neither are available contact a member of the leadership team.           Pt answered no to all covid-19 prescreening questions. I asked the pt to wear a mask to his appointment. I let the pt know we are reducing the number of people coming into the office and if he can physically come to his appointment alone to please do so. I told him if anything changes between now and his appointment time to please give [place] a call. The pt verbalized understanding.

## 2018-11-01 ENCOUNTER — Ambulatory Visit (INDEPENDENT_AMBULATORY_CARE_PROVIDER_SITE_OTHER): Payer: Medicare HMO

## 2018-11-01 ENCOUNTER — Other Ambulatory Visit: Payer: Self-pay

## 2018-11-01 DIAGNOSIS — I495 Sick sinus syndrome: Secondary | ICD-10-CM

## 2018-11-02 LAB — CUP PACEART INCLINIC DEVICE CHECK
Battery Impedance: 31600 Ohm
Battery Remaining Longevity: 0 mo
Battery Voltage: 2.58 V
Brady Statistic RV Percent Paced: 42 %
Date Time Interrogation Session: 20200825135554
Implantable Lead Implant Date: 19980120
Implantable Lead Implant Date: 20050119
Implantable Lead Location: 753859
Implantable Lead Location: 753860
Implantable Pulse Generator Implant Date: 20050119
Lead Channel Impedance Value: 629 Ohm
Lead Channel Pacing Threshold Amplitude: 0.75 V
Lead Channel Pacing Threshold Pulse Width: 0.4 ms
Lead Channel Sensing Intrinsic Amplitude: 16.6 mV
Lead Channel Setting Pacing Amplitude: 2.5 V
Lead Channel Setting Pacing Pulse Width: 0.4 ms
Lead Channel Setting Sensing Sensitivity: 2 mV
Pulse Gen Model: 5376
Pulse Gen Serial Number: 927381

## 2018-11-02 NOTE — Progress Notes (Signed)
Pacemaker check in clinic. Normal device function. Thresholds, sensing, impedances consistent with previous measurements. Device programmed to maximize longevity. No mode switch or high ventricular rates noted. Device programmed at appropriate safety margins. Histogram distribution appropriate for patient activity level. Device programmed to optimize intrinsic conduction. Estimated longevity ERI. Patient does not participate in remote f/u. F/u with JA 11/11/18 to discuss gen change. Patient education completed.

## 2018-11-03 ENCOUNTER — Telehealth: Payer: Self-pay

## 2018-11-03 NOTE — Telephone Encounter (Signed)
-----   Message from [name], MD sent at 11/02/2018 12:13 PM EDT ----- Remote device check reviewed.   Device report notable for:  ERI battery status noted  Ok to schedule generator change

## 2018-11-11 ENCOUNTER — Encounter: Payer: Self-pay | Admitting: *Deleted

## 2018-11-11 ENCOUNTER — Encounter: Payer: Self-pay | Admitting: Internal Medicine

## 2018-11-11 ENCOUNTER — Other Ambulatory Visit: Payer: Self-pay

## 2018-11-11 ENCOUNTER — Ambulatory Visit: Payer: Medicare HMO | Admitting: Internal Medicine

## 2018-11-11 VITALS — BP 130/77 | HR 75 | Temp 98.5°F | Ht 64.0 in | Wt 190.0 lb

## 2018-11-11 DIAGNOSIS — I10 Essential (primary) hypertension: Secondary | ICD-10-CM | POA: Diagnosis not present

## 2018-11-11 DIAGNOSIS — Z01812 Encounter for preprocedural laboratory examination: Secondary | ICD-10-CM | POA: Diagnosis not present

## 2018-11-11 DIAGNOSIS — I4821 Permanent atrial fibrillation: Secondary | ICD-10-CM

## 2018-11-11 DIAGNOSIS — Z45010 Encounter for checking and testing of cardiac pacemaker pulse generator [battery]: Secondary | ICD-10-CM

## 2018-11-11 DIAGNOSIS — R001 Bradycardia, unspecified: Secondary | ICD-10-CM

## 2018-11-11 NOTE — Patient Instructions (Signed)
Medication Instructions:  Continue all current medications.  Labwork: BMET, CBC - orders given today.  Testing/Procedures: Generator change out - scheduled for 11/24/2018.  Follow-Up: Given at time of discharge.    Any Other Special Instructions Will Be Listed Below (If Applicable).  If you need a refill on your cardiac medications before your next appointment, please call your pharmacy.

## 2018-11-11 NOTE — Progress Notes (Signed)
    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  Since last being seen in our clinic, the patient reports doing very well.  His pacemaker has reached ERI.  Today, he denies symptoms of palpitations, chest pain, shortness of breath,  lower extremity edema, presyncope, or syncope.  + rare dizziness.  The patient is otherwise without complaint today.   Past Medical History:  Diagnosis Date  . Hypertension   . Overweight(278.02)    obesity  . Permanent atrial fibrillation   . 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 PACEMAKER INSERTION  03/28/03   St. Jude-PPM    ROS- all systems are reviewed and negative except as per HPI above  Current Outpatient Medications  Medication Sig Dispense Refill  . acetaminophen (TYLENOL) 325 MG tablet Take 650 mg by mouth every 6 (six) hours as needed for mild pain.    Marland Kitchen atenolol (TENORMIN) 100 MG tablet Take 100 mg by mouth daily.      Marland Kitchen losartan (COZAAR) 50 MG tablet Take 50 mg by mouth daily.    . Multiple Vitamin (MULTIVITAMIN WITH MINERALS) TABS tablet Take 1 tablet by mouth daily.    . nitroGLYCERIN (NITROSTAT) 0.4 MG SL tablet Place 1 tablet (0.4 mg total) under the tongue every 5 (five) minutes as needed for chest pain. 25 tablet 3  . XARELTO 20 MG TABS tablet Take 1 tablet (20 mg total) by mouth daily with supper. 30 tablet 5  . zolpidem (AMBIEN) 10 MG tablet Take 5 mg by mouth at bedtime as needed for sleep.      No current facility-administered medications for this visit.     Physical Exam: Vitals:   11/11/18 0937  BP: 130/77  Pulse: 75  Temp: 98.5 F (36.9 C)  SpO2: 95%  Weight: 190 lb (86.2 kg)  Height: 5\' 4"  (1.626 m)    GEN- The patient is well appearing, alert and oriented x 3 today.   Head- normocephalic, atraumatic Eyes-  Sclera clear, conjunctiva pink Ears-  hearing intact Oropharynx- clear Lungs-  normal work of breathing Chest- R sided pacemaker pocket is well healed Heart- Regular rate and rhythm, no murmurs  GI- soft, NT, ND, + BS Extremities- no clubbing, cyanosis, or edema  Pacemaker interrogation- reviewed in detail today,  See PACEART report   Assessment and Plan:  1. Symptomatic bradycardia  Normal pacemaker function, though he has reached ERI See Pace Art report No changes today he is not device dependant today He has a previous RV lead from 1998 which is capped and a second RV lead from 2005 which is working well but programmed unipolar chronically Risks, benefits, and alternatives to pacemaker pulse generator replacement were discussed in detail today.  The patient understands that risks include but are not limited to bleeding, infection, pneumothorax, perforation, tamponade, vascular damage, renal failure, MI, stroke, death, damage to his existing leads, and lead dislodgement and wishes to proceed.  We will therefore schedule the procedure at the next available time. Hold xarleto 24 hours prior to the procedure  2. HTN Stable No change required today  3. Permanent afib On xarelto Hold xarelto 24 hours prior to the procedure     [name] MD, [facility] 11/11/2018 9:59 AM

## 2018-11-11 NOTE — H&P (View-Only) (Signed)
    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  Since last being seen in our clinic, the patient reports doing very well.  His pacemaker has reached ERI.  Today, he denies symptoms of palpitations, chest pain, shortness of breath,  lower extremity edema, presyncope, or syncope.  + rare dizziness.  The patient is otherwise without complaint today.   Past Medical History:  Diagnosis Date  . Hypertension   . Overweight(278.02)    obesity  . Permanent atrial fibrillation   . 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 PACEMAKER INSERTION  03/28/03   St. Jude-PPM    ROS- all systems are reviewed and negative except as per HPI above  Current Outpatient Medications  Medication Sig Dispense Refill  . acetaminophen (TYLENOL) 325 MG tablet Take 650 mg by mouth every 6 (six) hours as needed for mild pain.    Marland Kitchen atenolol (TENORMIN) 100 MG tablet Take 100 mg by mouth daily.      Marland Kitchen losartan (COZAAR) 50 MG tablet Take 50 mg by mouth daily.    . Multiple Vitamin (MULTIVITAMIN WITH MINERALS) TABS tablet Take 1 tablet by mouth daily.    . nitroGLYCERIN (NITROSTAT) 0.4 MG SL tablet Place 1 tablet (0.4 mg total) under the tongue every 5 (five) minutes as needed for chest pain. 25 tablet 3  . XARELTO 20 MG TABS tablet Take 1 tablet (20 mg total) by mouth daily with supper. 30 tablet 5  . zolpidem (AMBIEN) 10 MG tablet Take 5 mg by mouth at bedtime as needed for sleep.      No current facility-administered medications for this visit.     Physical Exam: Vitals:   11/11/18 0937  BP: 130/77  Pulse: 75  Temp: 98.5 F (36.9 C)  SpO2: 95%  Weight: 190 lb (86.2 kg)  Height: 5\' 4"  (1.626 m)    GEN- The patient is well appearing, alert and oriented x 3 today.   Head- normocephalic, atraumatic Eyes-  Sclera clear, conjunctiva pink Ears-  hearing intact Oropharynx- clear Lungs-  normal work of breathing Chest- R sided pacemaker pocket is well healed Heart- Regular rate and rhythm, no murmurs  GI- soft, NT, ND, + BS Extremities- no clubbing, cyanosis, or edema  Pacemaker interrogation- reviewed in detail today,  See PACEART report   Assessment and Plan:  1. Symptomatic bradycardia  Normal pacemaker function, though he has reached ERI See Pace Art report No changes today he is not device dependant today He has a previous RV lead from 1998 which is capped and a second RV lead from 2005 which is working well but programmed unipolar chronically Risks, benefits, and alternatives to pacemaker pulse generator replacement were discussed in detail today.  The patient understands that risks include but are not limited to bleeding, infection, pneumothorax, perforation, tamponade, vascular damage, renal failure, MI, stroke, death, damage to his existing leads, and lead dislodgement and wishes to proceed.  We will therefore schedule the procedure at the next available time. Hold xarleto 24 hours prior to the procedure  2. HTN Stable No change required today  3. Permanent afib On xarelto Hold xarelto 24 hours prior to the procedure     [name] MD, [facility] 11/11/2018 9:59 AM

## 2018-11-21 ENCOUNTER — Other Ambulatory Visit (HOSPITAL_COMMUNITY)
Admission: RE | Admit: 2018-11-21 | Discharge: 2018-11-21 | Disposition: A | Discharge status: Home / Self Care | Payer: Medicare HMO | Source: Ambulatory Visit | Attending: Internal Medicine | Admitting: Internal Medicine

## 2018-11-21 ENCOUNTER — Other Ambulatory Visit: Payer: Self-pay

## 2018-11-21 DIAGNOSIS — R001 Bradycardia, unspecified: Secondary | ICD-10-CM | POA: Insufficient documentation

## 2018-11-21 DIAGNOSIS — I10 Essential (primary) hypertension: Secondary | ICD-10-CM | POA: Diagnosis not present

## 2018-11-21 DIAGNOSIS — Z136 Encounter for screening for cardiovascular disorders: Secondary | ICD-10-CM

## 2018-11-21 DIAGNOSIS — Z20828 Contact with and (suspected) exposure to other viral communicable diseases: Secondary | ICD-10-CM | POA: Diagnosis not present

## 2018-11-21 DIAGNOSIS — Z950 Presence of cardiac pacemaker: Secondary | ICD-10-CM | POA: Diagnosis not present

## 2018-11-21 DIAGNOSIS — Z01812 Encounter for preprocedural laboratory examination: Secondary | ICD-10-CM | POA: Diagnosis present

## 2018-11-21 LAB — BASIC METABOLIC PANEL
Anion gap: 8 (ref 5–15)
BUN: 20 mg/dL (ref 8–23)
CO2: 26 mmol/L (ref 22–32)
Calcium: 8.9 mg/dL (ref 8.9–10.3)
Chloride: 102 mmol/L (ref 98–111)
Creatinine, Ser: 1.18 mg/dL (ref 0.61–1.24)
GFR calc Af Amer: >60 mL/min (ref >60)
GFR calc non Af Amer: 59 mL/min — ABNORMAL LOW (ref >60)
Glucose, Bld: 107 mg/dL — ABNORMAL HIGH (ref 70–99)
Potassium: 4.8 mmol/L (ref 3.5–5.1)
Sodium: 136 mmol/L (ref 135–145)

## 2018-11-21 LAB — CBC
HCT: 48.4 % (ref 39.0–52.0)
Hemoglobin: 15.6 g/dL (ref 13.0–17.0)
MCH: 32.1 pg (ref 26.0–34.0)
MCHC: 32.2 g/dL (ref 30.0–36.0)
MCV: 99.6 fL (ref 80.0–100.0)
Platelets: 147 10*3/uL — ABNORMAL LOW (ref 150–400)
RBC: 4.86 MIL/uL (ref 4.22–5.81)
RDW: 13.2 % (ref 11.5–15.5)
WBC: 7.2 10*3/uL (ref 4.0–10.5)
nRBC: 0 % (ref 0.0–0.2)

## 2018-11-21 LAB — SARS CORONAVIRUS 2 (TAT 6-24 HRS): SARS Coronavirus 2: NEGATIVE

## 2018-11-24 ENCOUNTER — Ambulatory Visit (HOSPITAL_COMMUNITY)
Admission: RE | Admit: 2018-11-24 | Discharge: 2018-11-24 | Disposition: A | Discharge status: Home / Self Care | Payer: Medicare HMO | Attending: Internal Medicine | Admitting: Internal Medicine

## 2018-11-24 ENCOUNTER — Other Ambulatory Visit: Payer: Self-pay

## 2018-11-24 ENCOUNTER — Ambulatory Visit (HOSPITAL_COMMUNITY)
Admission: RE | Disposition: A | Discharge status: Home / Self Care | Payer: Medicare HMO | Source: Home / Self Care | Attending: Internal Medicine

## 2018-11-24 DIAGNOSIS — Z79899 Other long term (current) drug therapy: Secondary | ICD-10-CM | POA: Diagnosis not present

## 2018-11-24 DIAGNOSIS — I441 Atrioventricular block, second degree: Secondary | ICD-10-CM | POA: Insufficient documentation

## 2018-11-24 DIAGNOSIS — Z6832 Body mass index (BMI) 32.0-32.9, adult: Secondary | ICD-10-CM | POA: Diagnosis not present

## 2018-11-24 DIAGNOSIS — I4821 Permanent atrial fibrillation: Secondary | ICD-10-CM

## 2018-11-24 DIAGNOSIS — E669 Obesity, unspecified: Secondary | ICD-10-CM | POA: Insufficient documentation

## 2018-11-24 DIAGNOSIS — Z7901 Long term (current) use of anticoagulants: Secondary | ICD-10-CM | POA: Insufficient documentation

## 2018-11-24 DIAGNOSIS — Z45010 Encounter for checking and testing of cardiac pacemaker pulse generator [battery]: Secondary | ICD-10-CM

## 2018-11-24 DIAGNOSIS — I10 Essential (primary) hypertension: Secondary | ICD-10-CM | POA: Insufficient documentation

## 2018-11-24 HISTORY — PX: PPM GENERATOR CHANGEOUT: EP1233

## 2018-11-24 LAB — SURGICAL PCR SCREEN
MRSA, PCR: NEGATIVE
Staphylococcus aureus: NEGATIVE

## 2018-11-24 SURGERY — PPM GENERATOR CHANGEOUT

## 2018-11-24 MED ORDER — MUPIROCIN 2 % EX OINT
TOPICAL_OINTMENT | CUTANEOUS | Status: AC
  Administered 2018-11-24: 1 via NASAL
  Filled 2018-11-24: qty 22

## 2018-11-24 MED ORDER — VANCOMYCIN HCL IN DEXTROSE 1-5 GM/200ML-% IV SOLN
INTRAVENOUS | Status: AC
  Filled 2018-11-24: qty 200

## 2018-11-24 MED ORDER — SODIUM CHLORIDE 0.9 % IV SOLN
80.0000 mg | INTRAVENOUS | Status: AC
  Administered 2018-11-24: 80 mg

## 2018-11-24 MED ORDER — SODIUM CHLORIDE 0.9 % IV SOLN
INTRAVENOUS | Status: DC
  Administered 2018-11-24: 13:00:00 via INTRAVENOUS

## 2018-11-24 MED ORDER — LIDOCAINE HCL 1 % IJ SOLN
INTRAMUSCULAR | Status: AC
  Filled 2018-11-24: qty 60

## 2018-11-24 MED ORDER — SODIUM CHLORIDE 0.9 % IV SOLN: 250.0000 mL | INTRAVENOUS | Status: DC | PRN

## 2018-11-24 MED ORDER — SODIUM CHLORIDE 0.9% FLUSH: 3.0000 mL | INTRAVENOUS | Status: DC | PRN

## 2018-11-24 MED ORDER — SODIUM CHLORIDE 0.9% FLUSH: 3.0000 mL | Freq: Two times a day (BID) | INTRAVENOUS | Status: DC

## 2018-11-24 MED ORDER — SODIUM CHLORIDE 0.9 % IV SOLN
INTRAVENOUS | Status: AC
  Filled 2018-11-24: qty 2

## 2018-11-24 MED ORDER — ACETAMINOPHEN 325 MG PO TABS
325.0000 mg | ORAL_TABLET | ORAL | Status: DC | PRN
  Filled 2018-11-24: qty 2

## 2018-11-24 MED ORDER — ONDANSETRON HCL 4 MG/2ML IJ SOLN: 4.0000 mg | Freq: Four times a day (QID) | INTRAMUSCULAR | Status: DC | PRN

## 2018-11-24 MED ORDER — LIDOCAINE HCL (PF) 1 % IJ SOLN
INTRAMUSCULAR | Status: DC | PRN
  Administered 2018-11-24: 60 mL

## 2018-11-24 MED ORDER — CHLORHEXIDINE GLUCONATE 4 % EX LIQD: 60.0000 mL | Freq: Once | CUTANEOUS | Status: DC

## 2018-11-24 MED ORDER — VANCOMYCIN HCL IN DEXTROSE 1-5 GM/200ML-% IV SOLN
1000.0000 mg | INTRAVENOUS | Status: AC
  Administered 2018-11-24: 1000 mg via INTRAVENOUS

## 2018-11-24 SURGICAL SUPPLY — 4 items
CABLE SURGICAL S-101-97-12 (CABLE) ×2 IMPLANT
PACEMAKER ASSURITY DR-RF (Pacemaker) ×1 IMPLANT
PAD PRO RADIOLUCENT 2001M-C (PAD) ×2 IMPLANT
TRAY PACEMAKER INSERTION (PACKS) ×2 IMPLANT

## 2018-11-24 NOTE — Discharge Instructions (Signed)
Resume xarelto on 11/26/2018  Implantable Cardiac Device Battery Change, Care After This sheet gives you information about how to care for yourself after your procedure. Your health care provider may also give you more specific instructions. If you have problems or questions, contact your health care provider. What can I expect after the procedure? After your procedure, it is common to have:  Pain or soreness at the site where the cardiac device was inserted.  Swelling at the site where the cardiac device was inserted.  You should received an information card for your new device in 4-8 weeks. Follow these instructions at home: Incision care   Keep the incision clean and dry. ? Do not take baths, swim, or use a hot tub until after your wound check.  ? You may shower the day after your procedure, or as directed by your health care provider. ? Pat the area dry with a clean towel. Do not rub the area. This may cause bleeding.  Follow instructions from your health care provider about how to take care of your incision. Make sure you: ? Leave stitches (sutures), skin glue, or adhesive strips in place. These skin closures may need to stay in place for 2 weeks or longer. If adhesive strip edges start to loosen and curl up, you may trim the loose edges. Do not remove adhesive strips completely unless your health care provider tells you to do that.  Check your incision area every day for signs of infection. Check for: ? More redness, swelling, or pain. ? More fluid or blood. ? Warmth. ? Pus or a bad smell. Activity  Do not lift anything that is heavier than 10 lb (4.5 kg) until your health care provider says it is okay to do so.  For the first week, or as long as told by your health care provider: ? Avoid lifting your affected arm higher than your shoulder. ? After 1 week, Be gentle when you move your arms over your head. It is okay to raise your arm to comb your hair. ? Avoid strenuous  exercise.  Ask your health care provider when it is okay to: ? Resume your normal activities. ? Return to work or school. ? Resume sexual activity. Eating and drinking  Eat a heart-healthy diet. This should include plenty of fresh fruits and vegetables, whole grains, low-fat dairy products, and lean protein like chicken and fish.  Limit alcohol intake to no more than 1 drink a day for non-pregnant women and 2 drinks a day for men. One drink equals 12 oz of beer, 5 oz of wine, or 1 oz of hard liquor.  Check ingredients and nutrition facts on packaged foods and beverages. Avoid the following types of food: ? Food that is high in salt (sodium). ? Food that is high in saturated fat, like full-fat dairy or red meat. ? Food that is high in trans fat, like fried food. ? Food and drinks that are high in sugar. Lifestyle  Do not use any products that contain nicotine or tobacco, such as cigarettes and e-cigarettes. If you need help quitting, ask your health care provider.  Take steps to manage and control your weight.  Once cleared, get regular exercise. Aim for 150 minutes of moderate-intensity exercise (such as walking or yoga) or 75 minutes of vigorous exercise (such as running or swimming) each week.  Manage other health problems, such as diabetes or high blood pressure. Ask your health care provider how you can manage these conditions.  General instructions  Do not drive for 24 hours after your procedure if you were given a medicine to help you relax (sedative).  Take over-the-counter and prescription medicines only as told by your health care provider.  Avoid putting pressure on the area where the cardiac device was placed.  If you need an MRI after your cardiac device has been placed, be sure to tell the health care provider who orders the MRI that you have a cardiac device.  Avoid close and prolonged exposure to electrical devices that have strong magnetic fields. These  include: ? Cell phones. Avoid keeping them in a pocket near the cardiac device, and try using the ear opposite the cardiac device. ? MP3 players. ? Household appliances, like microwaves. ? Metal detectors. ? Electric generators. ? High-tension wires.  Keep all follow-up visits as directed by your health care provider. This is important. Contact a health care provider if:  You have pain at the incision site that is not relieved by over-the-counter or prescription medicines.  You have any of these around your incision site or coming from it: ? More redness, swelling, or pain. ? Fluid or blood. ? Warmth to the touch. ? Pus or a bad smell.  You have a fever.  You feel brief, occasional palpitations, light-headedness, or any symptoms that you think might be related to your heart. Get help right away if:  You experience chest pain that is different from the pain at the cardiac device site.  You develop a red streak that extends above or below the incision site.  You experience shortness of breath.  You have palpitations or an irregular heartbeat.  You have light-headedness that does not go away quickly.  You faint or have dizzy spells.  Your pulse suddenly drops or increases rapidly and does not return to normal.  You begin to gain weight and your legs and ankles swell. Summary  After your procedure, it is common to have pain, soreness, and some swelling where the cardiac device was inserted.  Make sure to keep your incision clean and dry. Follow instructions from your health care provider about how to take care of your incision.  Check your incision every day for signs of infection, such as more pain or swelling, pus or a bad smell, warmth, or leaking fluid and blood.  Avoid strenuous exercise and lifting your left arm higher than your shoulder for 2 weeks, or as long as told by your health care provider. This information is not intended to replace advice given to you by  your health care provider. Make sure you discuss any questions you have with your health care provider.

## 2018-11-24 NOTE — Interval H&P Note (Signed)
History and Physical Interval Note:  11/24/2018 1:07 PM  Peter Howe  has presented today for surgery, with the diagnosis of ERI.  The various methods of treatment have been discussed with the patient and family. After consideration of risks, benefits and other options for treatment, the patient has consented to  Procedure(s): PPM GENERATOR CHANGEOUT (N/A) as a surgical intervention.  The patient's history has been reviewed, patient examined, no change in status, stable for surgery.  I have reviewed the patient's chart and labs.  Questions were answered to the patient's satisfaction.     [name]

## 2018-11-25 ENCOUNTER — Encounter (HOSPITAL_COMMUNITY): Payer: Self-pay | Admitting: Internal Medicine

## 2018-11-25 MED FILL — Mupirocin Oint 2%: CUTANEOUS | Qty: 22 | Status: AC

## 2018-11-25 MED FILL — Lidocaine HCl Local Inj 1%: INTRAMUSCULAR | Qty: 60 | Status: AC

## 2018-12-08 ENCOUNTER — Ambulatory Visit (INDEPENDENT_AMBULATORY_CARE_PROVIDER_SITE_OTHER): Payer: Medicare HMO | Admitting: Student

## 2018-12-08 ENCOUNTER — Other Ambulatory Visit: Payer: Self-pay

## 2018-12-08 DIAGNOSIS — I4821 Permanent atrial fibrillation: Secondary | ICD-10-CM | POA: Diagnosis not present

## 2018-12-08 DIAGNOSIS — R001 Bradycardia, unspecified: Secondary | ICD-10-CM

## 2018-12-08 LAB — CUP PACEART INCLINIC DEVICE CHECK
Battery Remaining Longevity: 117 mo
Battery Voltage: 3.07 V
Brady Statistic RA Percent Paced: 0 %
Brady Statistic RV Percent Paced: 32 %
Date Time Interrogation Session: 20201001154547
Implantable Lead Implant Date: 19980120
Implantable Lead Implant Date: 20050119
Implantable Lead Location: 753859
Implantable Lead Location: 753860
Implantable Pulse Generator Implant Date: 20200917
Lead Channel Impedance Value: 687.5 Ohm
Lead Channel Pacing Threshold Amplitude: 0.5 V
Lead Channel Pacing Threshold Amplitude: 0.5 V
Lead Channel Pacing Threshold Pulse Width: 0.4 ms
Lead Channel Pacing Threshold Pulse Width: 0.4 ms
Lead Channel Sensing Intrinsic Amplitude: 1.8 mV
Lead Channel Sensing Intrinsic Amplitude: 12 mV
Lead Channel Setting Pacing Amplitude: 2.5 V
Lead Channel Setting Pacing Pulse Width: 0.4 ms
Lead Channel Setting Sensing Sensitivity: 2 mV
Pulse Gen Model: 2272
Pulse Gen Serial Number: 9154140

## 2018-12-08 NOTE — Progress Notes (Signed)
Wound check appointment. Steri-strips removed. Wound without redness or edema. Incision edges approximated, wound well healed. Normal device function. Thresholds, sensing, and impedances consistent with implant measurements. Device programmed at appropriate safety margin for chronic lead. Histogram distribution appropriate for patient and level of activity. No atrial data with permanent Afib. No high ventricular rates noted. Plan to RTC in 3 months for assessment with Dr. Rayann Heman or EP APP.  Legrand Como [address] [place], PA-C  12/08/2018 3:45 PM

## 2019-01-24 ENCOUNTER — Other Ambulatory Visit: Payer: Self-pay | Admitting: *Deleted

## 2019-01-24 DIAGNOSIS — Z20822 Contact with and (suspected) exposure to covid-19: Secondary | ICD-10-CM

## 2019-01-26 LAB — NOVEL CORONAVIRUS, NAA: SARS-CoV-2, NAA: NOT DETECTED

## 2019-02-07 ENCOUNTER — Ambulatory Visit (INDEPENDENT_AMBULATORY_CARE_PROVIDER_SITE_OTHER): Payer: Medicare HMO | Admitting: *Deleted

## 2019-02-07 ENCOUNTER — Other Ambulatory Visit: Payer: Self-pay

## 2019-02-07 DIAGNOSIS — Z950 Presence of cardiac pacemaker: Secondary | ICD-10-CM

## 2019-02-08 LAB — CUP PACEART REMOTE DEVICE CHECK
Battery Remaining Longevity: 137 mo
Battery Remaining Percentage: 95.5 %
Battery Voltage: 3.04 V
Brady Statistic RV Percent Paced: 31 %
Date Time Interrogation Session: 20201201135550
Implantable Lead Implant Date: 19980120
Implantable Lead Implant Date: 20050119
Implantable Lead Location: 753859
Implantable Lead Location: 753860
Implantable Pulse Generator Implant Date: 20200917
Lead Channel Impedance Value: 740 Ohm
Lead Channel Pacing Threshold Amplitude: 0.5 V
Lead Channel Pacing Threshold Pulse Width: 0.4 ms
Lead Channel Sensing Intrinsic Amplitude: 12 mV
Lead Channel Setting Pacing Amplitude: 2.5 V
Lead Channel Setting Pacing Pulse Width: 0.4 ms
Lead Channel Setting Sensing Sensitivity: 2 mV
Pulse Gen Model: 2272
Pulse Gen Serial Number: 9154140

## 2019-02-23 ENCOUNTER — Ambulatory Visit: Payer: Medicare HMO | Attending: Internal Medicine

## 2019-02-23 ENCOUNTER — Other Ambulatory Visit: Payer: Self-pay

## 2019-02-23 DIAGNOSIS — Z20822 Contact with and (suspected) exposure to covid-19: Secondary | ICD-10-CM

## 2019-02-24 LAB — NOVEL CORONAVIRUS, NAA: SARS-CoV-2, NAA: NOT DETECTED

## 2019-03-06 ENCOUNTER — Other Ambulatory Visit: Payer: Self-pay | Admitting: Internal Medicine

## 2019-03-06 ENCOUNTER — Encounter: Payer: Self-pay | Admitting: Internal Medicine

## 2019-03-06 ENCOUNTER — Telehealth (INDEPENDENT_AMBULATORY_CARE_PROVIDER_SITE_OTHER): Payer: Medicare HMO | Admitting: Internal Medicine

## 2019-03-06 ENCOUNTER — Encounter: Payer: Medicare HMO | Admitting: Internal Medicine

## 2019-03-06 VITALS — BP 139/74 | Ht 64.0 in | Wt 189.0 lb

## 2019-03-06 DIAGNOSIS — D6869 Other thrombophilia: Secondary | ICD-10-CM | POA: Diagnosis not present

## 2019-03-06 DIAGNOSIS — I4821 Permanent atrial fibrillation: Secondary | ICD-10-CM

## 2019-03-06 DIAGNOSIS — I10 Essential (primary) hypertension: Secondary | ICD-10-CM

## 2019-03-06 DIAGNOSIS — R001 Bradycardia, unspecified: Secondary | ICD-10-CM

## 2019-03-06 NOTE — Progress Notes (Signed)
Electrophysiology TeleHealth Note   Due to national recommendations of social distancing due to COVID 19, an audio/video telehealth visit is felt to be most appropriate for this patient at this time.  See MyChart message from today for the patient's consent to telehealth for [facility].   Date:  03/06/2019   ID:  Peter Howe, DOB 1940/11/26, MRN [id number]  Location: patient's home  Provider location:  [facility]  Evaluation Performed: Follow-up visit  PCP:  Adaline Sill, NP   Electrophysiologist:  Dr Rayann Heman  Chief Complaint:  palpitations  History of Present Illness:    Peter Howe is a 78 y.o. male who presents via telehealth conferencing today.  Since his pacemaker generator change, the patient reports doing very well.  He has permanent atrial fibrillation with occasional palpitations.  He has been active, without difficulty.  Playing golf.  Today, he denies symptoms of palpitations, chest pain, shortness of breath,  lower extremity edema, dizziness, presyncope, or syncope.  The patient is otherwise without complaint today.  The patient denies symptoms of fevers, chills, cough, or new SOB worrisome for COVID 19.  Past Medical History:  Diagnosis Date  . Hypertension   . Overweight(278.02)    obesity  . Permanent atrial fibrillation ([place])   . 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 PACEMAKER INSERTION  03/28/03   St. Jude-PPM  . PPM GENERATOR CHANGEOUT N/A 11/24/2018   Procedure: PPM GENERATOR CHANGEOUT;  Surgeon: [name], MD;  Location: [place] [facility];  Service: Cardiovascular;  Laterality: N/A;    Current Outpatient Medications  Medication Sig Dispense Refill  . acetaminophen (TYLENOL) 325 MG tablet Take 650 mg by mouth every 6 (six) hours as needed for mild pain.    Marland Kitchen atenolol (TENORMIN) 100 MG tablet Take 100 mg by mouth daily.      Marland Kitchen losartan (COZAAR) 100 MG tablet Take 50  mg by mouth daily.     . Multiple Vitamin (MULTIVITAMIN WITH MINERALS) TABS tablet Take 1 tablet by mouth daily.    . nitroGLYCERIN (NITROSTAT) 0.4 MG SL tablet Place 1 tablet (0.4 mg total) under the tongue every 5 (five) minutes as needed for chest pain. 25 tablet 3  . XARELTO 20 MG TABS tablet Take 1 tablet (20 mg total) by mouth daily with supper. (Patient taking differently: Take 20 mg by mouth daily with supper. ) 30 tablet 5  . zolpidem (AMBIEN) 10 MG tablet Take 5 mg by mouth at bedtime as needed for sleep.      No current facility-administered medications for this visit.    Allergies:   Penicillins and Sulfonamide derivatives   Social History:  The patient  reports that he quit smoking about 26 years ago. His smoking use included cigarettes. He started smoking about 63 years ago. He has a 30.00 pack-year smoking history. He has never used smokeless tobacco. He reports current alcohol use. He reports that he does not use drugs.   Family History:  The patient's family history includes CAD (age of onset: 68) in his father; Cancer (age of onset: 60) in his mother.   ROS:  Please see the history of present illness.   All other systems are personally reviewed and negative.    Exam:    Vital Signs:  BP 139/74   Ht 5\' 4"  (1.626 m)   Wt 189 lb (85.7 kg)   BMI 32.44 kg/m  Well sounding and appearing, alert and conversant, regular work of breathing,  good skin color Eyes- anicteric, neuro- grossly intact, skin- no apparent rash or lesions or cyanosis, mouth- oral mucosa is pink  Labs/Other Tests and Data Reviewed:    Recent Labs: 11/21/2018: BUN 20; Creatinine, Ser 1.18; Hemoglobin 15.6; Platelets 147; Potassium 4.8; Sodium 136   Wt Readings from Last 3 Encounters:  03/06/19 189 lb (85.7 kg)  11/24/18 190 lb (86.2 kg)  11/11/18 190 lb (86.2 kg)     Last device remote is reviewed from [facility] PDF which reveals normal device function    ASSESSMENT & PLAN:    1.  Symptomatic  bradycardia Normal pacemaker function by recent remotes No changes indicated at this time  2. Permanent afib Rate controlled On xarelto for chads2vasc score of 3  3. HTN Stable No change required today  Follow-up:  Remotes,  Return to see EP APP in a year   Patient Risk:  after full review of this patients clinical status, I feel that they are at moderate risk at this time.  Today, I have spent 15 minutes with the patient with telehealth technology discussing arrhythmia management .    Army Fossa, MD  03/06/2019 8:10 AM     [facility] [address] [place] [address] [phone] (office) [phone] (fax)

## 2019-03-07 NOTE — Telephone Encounter (Signed)
Xarelto 20mg  refill request received. Pt is 78 years old, weight-85.7kg, Crea-1.18 on 11/21/2018, last seen by Dr. Rayann Heman on 03/06/2019 via Telemedicine, Diagnosis-Afib, CrCl-62.64ml/min; Dose is appropriate based on dosing criteria. Will send in refill to requested pharmacy.

## 2019-04-10 ENCOUNTER — Other Ambulatory Visit: Payer: Self-pay | Admitting: Internal Medicine

## 2019-04-14 ENCOUNTER — Other Ambulatory Visit: Payer: Self-pay | Admitting: Internal Medicine

## 2019-04-14 MED ORDER — ATENOLOL 100 MG PO TABS: 100.0000 mg | ORAL_TABLET | Freq: Every day | ORAL | 3 refills | Status: DC

## 2019-04-18 ENCOUNTER — Other Ambulatory Visit: Payer: Self-pay

## 2019-04-19 ENCOUNTER — Encounter: Payer: Self-pay | Admitting: Family Medicine

## 2019-04-19 ENCOUNTER — Ambulatory Visit (INDEPENDENT_AMBULATORY_CARE_PROVIDER_SITE_OTHER): Payer: Medicare HMO | Admitting: Family Medicine

## 2019-04-19 VITALS — BP 116/65 | HR 85 | Temp 98.4°F | Resp 18 | Ht 64.0 in | Wt 195.4 lb

## 2019-04-19 DIAGNOSIS — I4811 Longstanding persistent atrial fibrillation: Secondary | ICD-10-CM

## 2019-04-19 DIAGNOSIS — I10 Essential (primary) hypertension: Secondary | ICD-10-CM | POA: Diagnosis not present

## 2019-04-19 DIAGNOSIS — Z6833 Body mass index (BMI) 33.0-33.9, adult: Secondary | ICD-10-CM | POA: Diagnosis not present

## 2019-04-19 DIAGNOSIS — K219 Gastro-esophageal reflux disease without esophagitis: Secondary | ICD-10-CM | POA: Diagnosis not present

## 2019-04-19 MED ORDER — ATENOLOL 100 MG PO TABS: 100.0000 mg | ORAL_TABLET | Freq: Every day | ORAL | 3 refills | Status: DC

## 2019-04-19 NOTE — Patient Instructions (Signed)
It was a pleasure seeing you today, Peter Howe.  Information regarding what we discussed is included in this packet.  Please make an appointment to see me in 3 months.    If you had labs performed today, you will be contacted with the abnormal results once they are available, usually in the next 3 business days for routine lab work.  If you had STI testing, a pap smear, or a biopsy performed, expect to be contacted in about 7-10 days. If results are normal, you will not be notified.    In a few days you may receive a survey in the mail or online from [employer] regarding your visit with [place] today. Please take a moment to fill this out. Your feedback is very important to our office. It can help [place] better understand your needs as well as improve your experience and satisfaction. Thank you for taking your time to complete it. We care about you.  Because of recent events of COVID-19 ("Coronavirus"), please follow CDC recommendations:   1. Wash your hand frequently 2. Avoid touching your face 3. Stay away from people who are sick 4. If you have symptoms such as fever, cough, shortness of breath then call your healthcare provider for further guidance 5. If you are sick, STAY AT HOME, unless otherwise directed by your healthcare provider. 6. Follow directions from state and national officials regarding staying safe    Please feel free to call our office if any questions or concerns arise.  Warm Regards, Peter Pouch, FNP-C [place] [address] [place],  [address] [phone]

## 2019-04-19 NOTE — Progress Notes (Signed)
Subjective:  Patient ID: Peter Howe, male    DOB: [birth year], 79 y.o.   MRN: [id number]  Patient Care Team: Adaline Sill, NP as PCP - General (Internal Medicine) [name], MD as PCP - Electrophysiology (Cardiology)   Chief Complaint:  Establish Care   HPI: Peter Howe is a 79 y.o. male presenting on 04/19/2019 for Establish Care  Patient presents today to establish care with new PCP.  Patient states he is doing fairly well overall.  He does have a history of hypertension that is well controlled with current medication regimen and diet.  He denies associated side effects from medications.  No chest pain, shortness of breath, palpitations, dizziness, weakness, confusion, or syncope.  No leg swelling.  He does have a history of atrial fibrillation and is followed by cardiology on a regular basis.  He recently developed symptomatic bradycardia and Dr. Rayann Heman placed a pacemaker.  Patient states he has been doing very well since procedure.  Has followed up with Dr. Are read via video visit since pacemaker placed.  He denies palpitations, chest pain, weakness, dizziness, confusion, fatigue, or syncope. Has a history of GERD.  States symptoms are well controlled with diet.  No trouble swallowing, voice change, cough, hemoptysis, hematochezia, or melena.  No abdominal pain or weight changes.  He does reports alcohol on a regular basis.  States 8 alcoholic beverages per week.  Relevant past medical, surgical, family, and social history reviewed and updated as indicated.  Allergies and medications reviewed and updated. Date reviewed: Chart in Epic.   Past Medical History:  Diagnosis Date  . Hypertension   . Overweight(278.02)    obesity  . Permanent atrial fibrillation ([address])   . 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 PACEMAKER INSERTION  03/28/03   St. Jude-PPM  . PPM GENERATOR CHANGEOUT N/A 11/24/2018   Procedure: PPM GENERATOR CHANGEOUT;  Surgeon: [name], MD;  Location: [place] [facility];  Service: Cardiovascular;  Laterality: N/A;    Social History   Socioeconomic History  . Marital status: Widowed    Spouse name: Not on file  . Number of children: Not on file  . Years of education: Not on file  . Highest education level: Not on file  Occupational History  . Occupation: runs a Engineer, technical sales    Comment: part time  Tobacco Use  . Smoking status: Former Smoker    Packs/day: 1.00    Years: 30.00    Pack years: 30.00    Types: Cigarettes    Start date: 03/30/1955    Quit date: 03/09/1993    Years since quitting: 26.1  . Smokeless tobacco: Never Used  Substance and Sexual Activity  . Alcohol use: Yes    Alcohol/week: 0.0 standard drinks    Comment: 0-3 beers daily with rare excess  . Drug use: No  . Sexual activity: Not on file  Other Topics Concern  . Not on file  Social History Narrative   Owns a Engineer, technical sales, enjoys golf   Lives in [place] Strain:   . Difficulty of Paying Living Expenses: Not on file  Food Insecurity:   . Worried About Charity fundraiser in the Last Year: Not on file  . Ran Out of Food in the Last Year: Not on file  Transportation Needs:   . Lack of Transportation (  Medical): Not on file  . Lack of Transportation (Non-Medical): Not on file  Physical Activity:   . Days of Exercise per Week: Not on file  . Minutes of Exercise per Session: Not on file  Stress:   . Feeling of Stress : Not on file  Social Connections:   . Frequency of Communication with Friends and Family: Not on file  . Frequency of Social Gatherings with Friends and Family: Not on file  . Attends Religious Services: Not on file  . Active Member of Clubs or Organizations: Not on file  . Attends Archivist Meetings: Not on file  . Marital Status: Not on file  Intimate Partner Violence:   . Fear of Current or  Ex-Partner: Not on file  . Emotionally Abused: Not on file  . Physically Abused: Not on file  . Sexually Abused: Not on file    Outpatient Encounter Medications as of 04/19/2019  Medication Sig  . acetaminophen (TYLENOL) 325 MG tablet Take 650 mg by mouth every 6 (six) hours as needed for mild pain.  Marland Kitchen atenolol (TENORMIN) 100 MG tablet Take 1 tablet (100 mg total) by mouth daily.  . colchicine 0.6 MG tablet Take 0.6 mg by mouth 2 (two) times daily as needed.  Marland Kitchen losartan (COZAAR) 100 MG tablet Take 50 mg by mouth daily.   . Multiple Vitamin (MULTIVITAMIN WITH MINERALS) TABS tablet Take 1 tablet by mouth daily.  . nitroGLYCERIN (NITROSTAT) 0.4 MG SL tablet Place 1 tablet (0.4 mg total) under the tongue every 5 (five) minutes as needed for chest pain.  Marland Kitchen XARELTO 20 MG TABS tablet Take 1 tablet (20 mg total) by mouth daily with supper.  . zolpidem (AMBIEN) 10 MG tablet Take 5 mg by mouth at bedtime as needed for sleep.   . [DISCONTINUED] atenolol (TENORMIN) 100 MG tablet Take 1 tablet (100 mg total) by mouth daily.   No facility-administered encounter medications on file as of 04/19/2019.    Allergies  Allergen Reactions  . Penicillins Rash    Has patient had a PCN reaction causing immediate rash, facial/tongue/throat swelling, SOB or lightheadedness with hypotension: No Has patient had a PCN reaction causing severe rash involving mucus membranes or skin necrosis: No Has patient had a PCN reaction that required hospitalization No Has patient had a PCN reaction occurring within the last 10 years: No If all of the above answers are "NO", then may proceed with Cephalosporin use.   . Sulfonamide Derivatives Rash    Review of Systems  Constitutional: Negative for activity change, appetite change, chills, diaphoresis, fatigue, fever and unexpected weight change.  HENT: Negative.  Negative for sore throat, trouble swallowing and voice change.   Eyes: Negative.  Negative for photophobia and  visual disturbance.  Respiratory: Negative for cough, chest tightness and shortness of breath.   Cardiovascular: Negative for chest pain, palpitations and leg swelling.  Gastrointestinal: Negative for abdominal pain, blood in stool, constipation, diarrhea, nausea and vomiting.  Endocrine: Negative.  Negative for cold intolerance, heat intolerance, polydipsia, polyphagia and polyuria.  Genitourinary: Negative for difficulty urinating, dysuria, frequency and urgency.  Musculoskeletal: Negative for arthralgias and myalgias.  Skin: Negative.   Allergic/Immunologic: Negative.   Neurological: Negative for dizziness, tremors, seizures, syncope, facial asymmetry, speech difficulty, weakness, light-headedness, numbness and headaches.  Hematological: Negative.   Psychiatric/Behavioral: Negative for confusion, hallucinations, sleep disturbance and suicidal ideas.  All other systems reviewed and are negative.       Objective:  BP 116/65  Pulse 85   Temp 98.4 F (36.9 C)   Resp 18   Ht '5\' 4"'$  (1.626 m)   Wt 195 lb 6.4 oz (88.6 kg)   SpO2 98%   BMI 33.54 kg/m    Wt Readings from Last 3 Encounters:  04/19/19 195 lb 6.4 oz (88.6 kg)  03/06/19 189 lb (85.7 kg)  11/24/18 190 lb (86.2 kg)    Physical Exam Vitals and nursing note reviewed.  Constitutional:      General: He is not in acute distress.    Appearance: Normal appearance. He is well-developed and well-groomed. He is obese. He is not ill-appearing, toxic-appearing or diaphoretic.  HENT:     Head: Normocephalic and atraumatic.     Jaw: There is normal jaw occlusion.     Right Ear: Hearing, tympanic membrane, ear canal and external ear normal.     Left Ear: Hearing, tympanic membrane, ear canal and external ear normal.     Nose: Nose normal.     Mouth/Throat:     Lips: Pink.     Mouth: Mucous membranes are moist.     Pharynx: Oropharynx is clear. Uvula midline.  Eyes:     General: Lids are normal.     Extraocular Movements:  Extraocular movements intact.     Conjunctiva/sclera: Conjunctivae normal.     Pupils: Pupils are equal, round, and reactive to light.  Neck:     Thyroid: No thyroid mass, thyromegaly or thyroid tenderness.     Vascular: No carotid bruit or JVD.     Trachea: Trachea and phonation normal.  Cardiovascular:     Rate and Rhythm: Normal rate and regular rhythm.     Chest Wall: PMI is not displaced.     Pulses: Normal pulses.     Heart sounds: Normal heart sounds. No murmur. No friction rub. No gallop.   Pulmonary:     Effort: Pulmonary effort is normal. No respiratory distress.     Breath sounds: Normal breath sounds. No wheezing.  Chest:     Comments: Well-healed surgical scar from recent pacemaker placement. Abdominal:     General: Abdomen is protuberant. Bowel sounds are normal. There is no distension or abdominal bruit.     Palpations: Abdomen is soft. There is no hepatomegaly or splenomegaly.     Tenderness: There is no abdominal tenderness. There is no right CVA tenderness or left CVA tenderness.     Hernia: No hernia is present.  Musculoskeletal:        General: Normal range of motion.     Cervical back: Normal range of motion and neck supple.     Right lower leg: No edema.     Left lower leg: No edema.  Lymphadenopathy:     Cervical: No cervical adenopathy.  Skin:    General: Skin is warm and dry.     Capillary Refill: Capillary refill takes less than 2 seconds.     Coloration: Skin is not cyanotic, jaundiced or pale.     Findings: No rash.  Neurological:     General: No focal deficit present.     Mental Status: He is alert and oriented to person, place, and time.     Cranial Nerves: Cranial nerves are intact. No cranial nerve deficit.     Sensory: Sensation is intact. No sensory deficit.     Motor: Motor function is intact. No weakness.     Coordination: Coordination is intact. Coordination normal.     Gait: Gait is  intact. Gait normal.     Deep Tendon Reflexes: Reflexes  are normal and symmetric. Reflexes normal.  Psychiatric:        Attention and Perception: Attention and perception normal.        Mood and Affect: Mood and affect normal.        Speech: Speech normal.        Behavior: Behavior normal. Behavior is cooperative.        Thought Content: Thought content normal.        Cognition and Memory: Cognition and memory normal.        Judgment: Judgment normal.     Results for orders placed or performed in visit on 02/23/19  Novel Coronavirus, NAA ([facility])   Specimen: Nasopharyngeal(NP) swabs in vial transport medium   NASOPHARYNGE  TESTING  Result Value Ref Range   SARS-CoV-2, NAA Not Detected Not Detected       Pertinent labs & imaging results that were available during my care of the patient were reviewed by me and considered in my medical decision making.  Assessment & Plan:  [place] was seen today for establish care.  Diagnoses and all orders for this visit:  Essential hypertension with goal blood pressure less than 130/80 BP well controlled. Changes were not made in regimen today. Goal BP is 130/80. Pt aware to report any persistent high or low readings. DASH diet and exercise encouraged. Exercise at least 150 minutes per week and increase as tolerated. Goal BMI > 25. Stress management encouraged. Avoid nicotine and tobacco product use. Avoid excessive alcohol and NSAID's. Avoid more than 2000 mg of sodium daily. Medications as prescribed. Follow up as scheduled.  -     atenolol (TENORMIN) 100 MG tablet; Take 1 tablet (100 mg total) by mouth daily. -     CBC with Differential/Platelet -     CMP14+EGFR -     Lipid panel -     Thyroid Panel With TSH  Gastroesophageal reflux disease without esophagitis No red flags present. Diet discussed. Avoid fried, spicy, fatty, greasy, and acidic foods. Avoid caffeine, nicotine, and alcohol. Do not eat 2-3 hours before bedtime and stay upright for at least 1-2 hours after eating. Eat small frequent  meals. Avoid NSAID's like motrin and aleve. Medications as prescribed. Report any new or worsening symptoms. Follow up as discussed or sooner if needed.   -     CBC with Differential/Platelet  Longstanding persistent atrial fibrillation (HCC) Well controlled. Keep follow up with cardiology as scheduled.  -     CBC with Differential/Platelet -     Thyroid Panel With TSH  BMI 33.0-33.9,adult Diet and exercise encouraged. Labs pending.  -     CBC with Differential/Platelet -     CMP14+EGFR -     Lipid panel -     Thyroid Panel With TSH     Continue all other maintenance medications.  Follow up plan: Return in about 3 months (around 07/17/2019), or if symptoms worsen or fail to improve.  Continue healthy lifestyle choices, including diet (rich in fruits, vegetables, and lean proteins, and low in salt and simple carbohydrates) and exercise (at least 30 minutes of moderate physical activity daily).  Educational handout given for survey, COVID-19  The above assessment and management plan was discussed with the patient. The patient verbalized understanding of and has agreed to the management plan. Patient is aware to call the clinic if they develop any new symptoms or if symptoms persist or worsen.  Patient is aware when to return to the clinic for a follow-up visit. Patient educated on when it is appropriate to go to the emergency department.   Monia Pouch, FNP-C [facility] [phone]

## 2019-04-20 LAB — CBC WITH DIFFERENTIAL/PLATELET
Basophils Absolute: 0 10*3/uL (ref 0.0–0.2)
Basos: 1 %
EOS (ABSOLUTE): 0.1 10*3/uL (ref 0.0–0.4)
Eos: 2 %
Hematocrit: 49.7 % (ref 37.5–51.0)
Hemoglobin: 16.7 g/dL (ref 13.0–17.7)
Immature Grans (Abs): 0 10*3/uL (ref 0.0–0.1)
Immature Granulocytes: 0 %
Lymphocytes Absolute: 1.4 10*3/uL (ref 0.7–3.1)
Lymphs: 24 %
MCH: 32.4 pg (ref 26.6–33.0)
MCHC: 33.6 g/dL (ref 31.5–35.7)
MCV: 97 fL (ref 79–97)
Monocytes Absolute: 0.7 10*3/uL (ref 0.1–0.9)
Monocytes: 12 %
Neutrophils Absolute: 3.7 10*3/uL (ref 1.4–7.0)
Neutrophils: 61 %
Platelets: 149 10*3/uL — ABNORMAL LOW (ref 150–450)
RBC: 5.15 x10E6/uL (ref 4.14–5.80)
RDW: 12.6 % (ref 11.6–15.4)
WBC: 6.1 10*3/uL (ref 3.4–10.8)

## 2019-04-20 LAB — LIPID PANEL
Chol/HDL Ratio: 2 ratio (ref 0.0–5.0)
Cholesterol, Total: 207 mg/dL — ABNORMAL HIGH (ref 100–199)
HDL: 102 mg/dL (ref >39)
LDL Chol Calc (NIH): 94 mg/dL (ref 0–99)
Triglycerides: 62 mg/dL (ref 0–149)
VLDL Cholesterol Cal: 11 mg/dL (ref 5–40)

## 2019-04-20 LAB — CMP14+EGFR
ALT: 37 IU/L (ref 0–44)
AST: 40 IU/L (ref 0–40)
Albumin/Globulin Ratio: 1.8 (ref 1.2–2.2)
Albumin: 4.3 g/dL (ref 3.7–4.7)
Alkaline Phosphatase: 81 IU/L (ref 39–117)
BUN/Creatinine Ratio: 15 (ref 10–24)
BUN: 17 mg/dL (ref 8–27)
Bilirubin Total: 0.8 mg/dL (ref 0.0–1.2)
CO2: 22 mmol/L (ref 20–29)
Calcium: 9.3 mg/dL (ref 8.6–10.2)
Chloride: 98 mmol/L (ref 96–106)
Creatinine, Ser: 1.16 mg/dL (ref 0.76–1.27)
GFR calc Af Amer: 69 mL/min/{1.73_m2} (ref >59)
GFR calc non Af Amer: 60 mL/min/{1.73_m2} (ref >59)
Globulin, Total: 2.4 g/dL (ref 1.5–4.5)
Glucose: 93 mg/dL (ref 65–99)
Potassium: 4.6 mmol/L (ref 3.5–5.2)
Sodium: 139 mmol/L (ref 134–144)
Total Protein: 6.7 g/dL (ref 6.0–8.5)

## 2019-04-20 LAB — THYROID PANEL WITH TSH
Free Thyroxine Index: 1.7 (ref 1.2–4.9)
T3 Uptake Ratio: 27 % (ref 24–39)
T4, Total: 6.2 ug/dL (ref 4.5–12.0)
TSH: 0.825 u[IU]/mL (ref 0.450–4.500)

## 2019-04-27 ENCOUNTER — Other Ambulatory Visit: Payer: Self-pay

## 2019-04-27 ENCOUNTER — Ambulatory Visit: Payer: Medicare HMO | Admitting: Family Medicine

## 2019-05-02 ENCOUNTER — Ambulatory Visit (INDEPENDENT_AMBULATORY_CARE_PROVIDER_SITE_OTHER): Payer: Medicare HMO | Admitting: *Deleted

## 2019-05-02 DIAGNOSIS — Z0000 Encounter for general adult medical examination without abnormal findings: Secondary | ICD-10-CM

## 2019-05-02 NOTE — Patient Instructions (Signed)
Preventive Care 75 Years and Older, Male Preventive care refers to lifestyle choices and visits with your health care provider that can promote health and wellness. This includes:  A yearly physical exam. This is also called an annual well check.  Regular dental and eye exams.  Immunizations.  Screening for certain conditions.  Healthy lifestyle choices, such as diet and exercise. What can I expect for my preventive care visit? Physical exam Your health care provider will check:  Height and weight. These may be used to calculate body mass index (BMI), which is a measurement that tells if you are at a healthy weight.  Heart rate and blood pressure.  Your skin for abnormal spots. Counseling Your health care provider may ask you questions about:  Alcohol, tobacco, and drug use.  Emotional well-being.  Home and relationship well-being.  Sexual activity.  Eating habits.  History of falls.  Memory and ability to understand (cognition).  Work and work Statistician. What immunizations do I need?  Influenza (flu) vaccine  This is recommended every year. Tetanus, diphtheria, and pertussis (Tdap) vaccine  You may need a Td booster every 10 years. Varicella (chickenpox) vaccine  You may need this vaccine if you have not already been vaccinated. Zoster (shingles) vaccine  You may need this after age 50. Pneumococcal conjugate (PCV13) vaccine  One dose is recommended after age 24. Pneumococcal polysaccharide (PPSV23) vaccine  One dose is recommended after age 33. Measles, mumps, and rubella (MMR) vaccine  You may need at least one dose of MMR if you were born in 1957 or later. You may also need a second dose. Meningococcal conjugate (MenACWY) vaccine  You may need this if you have certain conditions. Hepatitis A vaccine  You may need this if you have certain conditions or if you travel or work in places where you may be exposed to hepatitis A. Hepatitis B vaccine   You may need this if you have certain conditions or if you travel or work in places where you may be exposed to hepatitis B. Haemophilus influenzae type b (Hib) vaccine  You may need this if you have certain conditions. You may receive vaccines as individual doses or as more than one vaccine together in one shot (combination vaccines). Talk with your health care provider about the risks and benefits of combination vaccines. What tests do I need? Blood tests  Lipid and cholesterol levels. These may be checked every 5 years, or more frequently depending on your overall health.  Hepatitis C test.  Hepatitis B test. Screening  Lung cancer screening. You may have this screening every year starting at age 74 if you have a 30-pack-year history of smoking and currently smoke or have quit within the past 15 years.  Colorectal cancer screening. All adults should have this screening starting at age 57 and continuing until age 54. Your health care provider may recommend screening at age 47 if you are at increased risk. You will have tests every 1-10 years, depending on your results and the type of screening test.  Prostate cancer screening. Recommendations will vary depending on your family history and other risks.  Diabetes screening. This is done by checking your blood sugar (glucose) after you have not eaten for a while (fasting). You may have this done every 1-3 years.  Abdominal aortic aneurysm (AAA) screening. You may need this if you are a current or former smoker.  Sexually transmitted disease (STD) testing. Follow these instructions at home: Eating and drinking  Eat  a diet that includes fresh fruits and vegetables, whole grains, lean protein, and low-fat dairy products. Limit your intake of foods with high amounts of sugar, saturated fats, and salt.  Take vitamin and mineral supplements as recommended by your health care provider.  Do not drink alcohol if your health care  provider tells you not to drink.  If you drink alcohol: ? Limit how much you have to 0-2 drinks a day. ? Be aware of how much alcohol is in your drink. In the [place] one drink equals one 12 oz bottle of beer (355 mL), one 5 oz glass of wine (148 mL), or one 1 oz glass of hard liquor (44 mL). Lifestyle  Take daily care of your teeth and gums.  Stay active. Exercise for at least 30 minutes on 5 or more days each week.  Do not use any products that contain nicotine or tobacco, such as cigarettes, e-cigarettes, and chewing tobacco. If you need help quitting, ask your health care provider.  If you are sexually active, practice safe sex. Use a condom or other form of protection to prevent STIs (sexually transmitted infections).  Talk with your health care provider about taking a low-dose aspirin or statin. What's next?  Visit your health care provider once a year for a well check visit.  Ask your health care provider how often you should have your eyes and teeth checked.  Stay up to date on all vaccines. This information is not intended to replace advice given to you by your health care provider. Make sure you discuss any questions you have with your health care provider. Document Revised: 02/17/2018 Document Reviewed: 02/17/2018 Elsevier Patient Education  2020 Elsevier Inc.  

## 2019-05-02 NOTE — Progress Notes (Signed)
MEDICARE ANNUAL WELLNESS VISIT  05/02/2019  Telephone Visit Disclaimer This Medicare AWV was conducted by telephone due to national recommendations for restrictions regarding the COVID-19 Pandemic (e.g. social distancing).  I verified, using two identifiers, that I am speaking with Peter Howe or their authorized healthcare agent. I discussed the limitations, risks, security, and privacy concerns of performing an evaluation and management service by telephone and the potential availability of an in-person appointment in the future. The patient expressed understanding and agreed to proceed.   Subjective:  Peter Howe is a 79 y.o. male patient of Rakes, Connye Burkitt, FNP who had a Medicare Annual Wellness Visit today via telephone. 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 he had 1 daughter but she passed away but he has 1 grand-daughter that he is close with. he reports that he is socially active and does interact with friends/family regularly. he is moderately physically active and enjoys playing golf 3-4 times a week and working at [employer]  Patient Care Team: Baruch Gouty, FNP as PCP - General (Family Medicine) [name], MD as PCP - Electrophysiology (Cardiology)  Advanced Directives 05/02/2019 11/24/2018 03/26/2016 11/05/2015 11/05/2015  Does Patient Have a Medical Advance Directive? No No No No;Yes No  Would patient like information on creating a medical advance directive? No - Patient declined No - Patient declined No - Patient declined - [facility] Utilization Over the Past 12 Months: # of hospitalizations or ER visits: 0 # of surgeries: 1  Review of Systems    Patient reports that his overall health is unchanged compared to last year.  History obtained from chart review  Patient Reported Readings (BP, Pulse, CBG, Weight, etc) none  Pain Assessment Pain : No/denies pain     Current  Medications & Allergies (verified) Allergies as of 05/02/2019      Reactions   Penicillins Rash   Has patient had a PCN reaction causing immediate rash, facial/tongue/throat swelling, SOB or lightheadedness with hypotension: No Has patient had a PCN reaction causing severe rash involving mucus membranes or skin necrosis: No Has patient had a PCN reaction that required hospitalization No Has patient had a PCN reaction occurring within the last 10 years: No If all of the above answers are "NO", then may proceed with Cephalosporin use.   Sulfonamide Derivatives Rash      Medication List       Accurate as of May 02, 2019  9:02 AM. If you have any questions, ask your nurse or doctor.        acetaminophen 325 MG tablet Commonly known as: TYLENOL Take 650 mg by mouth every 6 (six) hours as needed for mild pain.   Ambien 10 MG tablet Generic drug: zolpidem Take 5 mg by mouth at bedtime as needed for sleep.   atenolol 100 MG tablet Commonly known as: TENORMIN Take 1 tablet (100 mg total) by mouth daily.   colchicine 0.6 MG tablet Take 0.6 mg by mouth 2 (two) times daily as needed.   losartan 100 MG tablet Commonly known as: COZAAR Take 50 mg by mouth daily.   multivitamin with minerals Tabs tablet Take 1 tablet by mouth daily.   nitroGLYCERIN 0.4 MG SL tablet Commonly known as: NITROSTAT Place 1 tablet (0.4 mg total) under the tongue every 5 (five) minutes as needed for chest pain.   Xarelto 20 MG Tabs tablet  Generic drug: rivaroxaban Take 1 tablet (20 mg total) by mouth daily with supper.       History (reviewed): Past Medical History:  Diagnosis Date  . Hypertension   . Overweight(278.02)    obesity  . Permanent atrial fibrillation ([place])   . 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 PACEMAKER INSERTION  03/28/03   St. Jude-PPM  . PPM GENERATOR CHANGEOUT N/A 11/24/2018   Procedure: PPM GENERATOR  CHANGEOUT;  Surgeon: [name], MD;  Location: [facility];  Service: Cardiovascular;  Laterality: N/A;   Family History  Problem Relation Age of Onset  . Cancer Mother 43  . CAD Father 43  . Heart disease Daughter   . Stroke Sister   . Cancer Sister   . Cancer Sister   . Diabetes Brother   . Cancer Brother   . Alcohol abuse Brother    Social History   Socioeconomic History  . Marital status: Widowed    Spouse name: Not on file  . Number of children: 1  . Years of education: 73  . Highest education level: High school graduate  Occupational History  . Occupation: runs a Engineer, technical sales    Comment: part time  Tobacco Use  . Smoking status: Former Smoker    Packs/day: 1.00    Years: 30.00    Pack years: 30.00    Types: Cigarettes    Start date: 03/30/1955    Quit date: 03/09/1993    Years since quitting: 26.1  . Smokeless tobacco: Never Used  Substance and Sexual Activity  . Alcohol use: Yes    Alcohol/week: 7.0 standard drinks    Types: 1 Glasses of wine, 2 Shots of liquor, 4 Cans of beer per week  . Drug use: No  . Sexual activity: Not Currently  Other Topics Concern  . Not on file  Social History Narrative   Owns a Engineer, technical sales, enjoys golf   Lives in [place]: [place]   . Difficulty of Paying Living Expenses: Not hard at all  Food Insecurity: No Food Insecurity  . Worried About Charity fundraiser in the Last Year: Never true  . Ran Out of Food in the Last Year: Never true  Transportation Needs: No Transportation Needs  . Lack of Transportation (Medical): No  . Lack of Transportation (Non-Medical): No  Physical Activity: Sufficiently Active  . Days of Exercise per Week: 7 days  . Minutes of Exercise per Session: 30 min  Stress: No Stress Concern Present  . Feeling of Stress : Not at all  Social Connections: Slightly Isolated  . Frequency of Communication with Friends and Family: More  than three times a week  . Frequency of Social Gatherings with Friends and Family: More than three times a week  . Attends Religious Services: More than 4 times per year  . Active Member of Clubs or Organizations: Yes  . Attends Archivist Meetings: More than 4 times per year  . Marital Status: Widowed    Activities of Daily Living In your present state of health, do you have any difficulty performing the following activities: 05/02/2019  Hearing? N  Vision? N  Comment wears OTC readers for fine print-gets yearly eye exam  Difficulty concentrating or making decisions? N  Walking or climbing stairs? 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 N  Doing errands, shopping? N  Preparing Food and eating ? N  Using the Toilet? N  In the past six months, have you accidently leaked urine? N  Do you have problems with loss of bowel control? N  Managing your Medications? N  Managing your Finances? N  Housekeeping or managing your Housekeeping? N  Some recent data might be hidden    Patient Education/ Literacy How often do you need to have someone help you when you read instructions, pamphlets, or other written materials from your doctor or pharmacy?: 1 - Never What is the last grade level you completed in school?: 12th grade  Exercise Current Exercise Habits: Home exercise routine, Type of exercise: walking, Time (Minutes): 30, Frequency (Times/Week): 7, Weekly Exercise (Minutes/Week): 210, Intensity: Mild, Exercise limited by: cardiac condition(s)  Diet Patient reports consuming 3 meals a day and 2 snack(s) a day Patient reports that his primary diet is: Regular Patient reports that she does have regular access to food.   Depression Screen PHQ 2/9 Scores 05/02/2019 04/19/2019  PHQ - 2 Score 0 0     Fall Risk Fall Risk  05/02/2019 04/19/2019  Falls in the past year? 0 0     Objective:  BILLION MCVICKER seemed alert and  oriented and he participated appropriately during our telephone visit.  Blood Pressure Weight BMI  BP Readings from Last 3 Encounters:  04/19/19 116/65  03/06/19 139/74  11/24/18 118/75   Wt Readings from Last 3 Encounters:  04/19/19 195 lb 6.4 oz (88.6 kg)  03/06/19 189 lb (85.7 kg)  11/24/18 190 lb (86.2 kg)   BMI Readings from Last 1 Encounters:  04/19/19 33.54 kg/m    *Unable to obtain current vital signs, weight, and BMI due to telephone visit type  Hearing/Vision  . Joson did not seem to have difficulty with hearing/understanding during the telephone conversation . Reports that he has had a formal eye exam by an eye care professional within the past year . Reports that he has not had a formal hearing evaluation within the past year *Unable to fully assess hearing and vision during telephone visit type  Cognitive Function: 6CIT Screen 05/02/2019  What Year? 0 points  What month? 0 points  What time? 0 points  Count back from 20 0 points  Months in reverse 0 points  Repeat phrase 0 points  Total Score 0   (Normal:0-7, Significant for Dysfunction: >8)  Normal Cognitive Function Screening: Yes   Immunization & Health Maintenance Record Immunization History  Administered Date(s) Administered  . Influenza-Unspecified 01/31/2018, 12/08/2018  . Pneumococcal Conjugate-13 12/15/2018    Health Maintenance  Topic Date Due  . TETANUS/TDAP  04/18/2020 (Originally 03/30/1959)  . PNA vac Low Risk Adult (2 of 2 - PPSV23) 04/18/2020 (Originally 12/15/2019)  . INFLUENZA VACCINE  Completed       Assessment  This is a routine wellness examination for ACHINTYA TERRERO.  Health Maintenance: Due or Overdue There are no preventive care reminders to display for this patient.  Peter Howe does not need a referral for Community Assistance: Care Management:   no Social Work:    no Prescription Assistance:  no Nutrition/Diabetes Education:  no   Plan:  Personalized  Goals Goals Addressed            This Visit's Progress   . DIET - INCREASE WATER INTAKE       Try to drink 6-8 glasses  of water daily      Personalized Health Maintenance & Screening Recommendations  Td vaccine Shingles vaccine  Lung Cancer Screening Recommended: no (Low Dose CT Chest recommended if Age 48-80 years, 30 pack-year currently smoking OR have quit w/in past 15 years) Hepatitis C Screening recommended: no HIV Screening recommended: no  Advanced Directives: Written information was not prepared per patient's request.  Referrals & Orders No orders of the defined types were placed in this encounter.   Follow-up Plan . Follow-up with Baruch Gouty, FNP as planned . Consider TDAP and Shingles vaccines at your next visit with your PCP   I have personally reviewed and noted the following in the patient's chart:   . Medical and social history . Use of alcohol, tobacco or illicit drugs  . Current medications and supplements . Functional ability and status . Nutritional status . Physical activity . Advanced directives . List of other physicians . Hospitalizations, surgeries, and ER visits in previous 12 months . Vitals . Screenings to include cognitive, depression, and falls . Referrals and appointments  In addition, I have reviewed and discussed with Peter Howe certain preventive protocols, quality metrics, and best practice recommendations. A written personalized care plan for preventive services as well as general preventive health recommendations is available and can be mailed to the patient at his request.      Milas Hock, LPN  [id number]

## 2019-05-09 ENCOUNTER — Ambulatory Visit (INDEPENDENT_AMBULATORY_CARE_PROVIDER_SITE_OTHER): Payer: Medicare HMO | Admitting: *Deleted

## 2019-05-09 DIAGNOSIS — Z950 Presence of cardiac pacemaker: Secondary | ICD-10-CM | POA: Diagnosis not present

## 2019-05-09 LAB — CUP PACEART REMOTE DEVICE CHECK
Battery Remaining Longevity: 134 mo
Battery Remaining Percentage: 95.5 %
Battery Voltage: 3.04 V
Brady Statistic RV Percent Paced: 34 %
Date Time Interrogation Session: 20210302081823
Implantable Lead Implant Date: 19980120
Implantable Lead Implant Date: 20050119
Implantable Lead Location: 753859
Implantable Lead Location: 753860
Implantable Pulse Generator Implant Date: 20200917
Lead Channel Impedance Value: 700 Ohm
Lead Channel Pacing Threshold Amplitude: 0.5 V
Lead Channel Pacing Threshold Pulse Width: 0.4 ms
Lead Channel Sensing Intrinsic Amplitude: 12 mV
Lead Channel Setting Pacing Amplitude: 2.5 V
Lead Channel Setting Pacing Pulse Width: 0.4 ms
Lead Channel Setting Sensing Sensitivity: 2 mV
Pulse Gen Model: 2272
Pulse Gen Serial Number: 9154140

## 2019-05-10 NOTE — Progress Notes (Signed)
PPM Remote  

## 2019-05-18 ENCOUNTER — Other Ambulatory Visit: Payer: Self-pay

## 2019-05-19 ENCOUNTER — Ambulatory Visit (INDEPENDENT_AMBULATORY_CARE_PROVIDER_SITE_OTHER): Payer: Medicare HMO | Admitting: Family Medicine

## 2019-05-19 ENCOUNTER — Encounter: Payer: Self-pay | Admitting: Family Medicine

## 2019-05-19 VITALS — BP 132/83 | HR 66 | Temp 99.0°F | Ht 64.0 in | Wt 194.0 lb

## 2019-05-19 DIAGNOSIS — Z79899 Other long term (current) drug therapy: Secondary | ICD-10-CM | POA: Diagnosis not present

## 2019-05-19 DIAGNOSIS — F5101 Primary insomnia: Secondary | ICD-10-CM

## 2019-05-19 DIAGNOSIS — I10 Essential (primary) hypertension: Secondary | ICD-10-CM

## 2019-05-19 MED ORDER — LOSARTAN POTASSIUM 100 MG PO TABS: 50.0000 mg | ORAL_TABLET | Freq: Every day | ORAL | 3 refills | Status: DC

## 2019-05-19 MED ORDER — ZOLPIDEM TARTRATE 10 MG PO TABS: 5.0000 mg | ORAL_TABLET | Freq: Every evening | ORAL | 5 refills | Status: DC | PRN

## 2019-05-19 NOTE — Progress Notes (Signed)
Subjective:  Patient ID: Peter Howe, male    DOB: 27-Jul-1940, 79 y.o.   MRN: [id number]  Patient Care Team: Baruch Gouty, FNP as PCP - General (Family Medicine) [name], MD as PCP - Electrophysiology (Cardiology)   Chief Complaint:  Medical Management of Chronic Issues and Hypertension   HPI: Peter Howe is a 79 y.o. male presenting on 05/19/2019 for Medical Management of Chronic Issues and Hypertension   1. Essential hypertension with goal blood pressure less than 130/80 Compliant with medications and tolerating well. No chest pain, headaches, shortness of breath, leg swelling, confusion, or weakness.   2. Primary insomnia 3. Controlled substance agreement signed Pt has been on Ambien 5 mg nightly for several years and has tolerated well. No excessive caffeine use or stimulant use. No adverse effects from the medications.      Relevant past medical, surgical, family, and social history reviewed and updated as indicated.  Allergies and medications reviewed and updated. Date reviewed: Chart in Epic.   Past Medical History:  Diagnosis Date  . Hypertension   . Overweight(278.02)    obesity  . Permanent atrial fibrillation ([place])   . 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 ARM SKIN LESION BIOPSY / EXCISION Left    Melanoma -removed by Dr. Nevada Crane  . PACEMAKER INSERTION  03/28/03   [employer]-PPM  . PPM GENERATOR CHANGEOUT N/A 11/24/2018   Procedure: PPM GENERATOR CHANGEOUT;  Surgeon: [name], MD;  Location: [facility];  Service: Cardiovascular;  Laterality: N/A;    Social History   Socioeconomic History  . Marital status: Widowed    Spouse name: Not on file  . Number of children: 1  . Years of education: 30  . Highest education level: High school graduate  Occupational History  . Occupation: runs a Engineer, technical sales    Comment: part time  Tobacco Use  . Smoking status: Former  Smoker    Packs/day: 1.00    Years: 30.00    Pack years: 30.00    Types: Cigarettes    Start date: 03/30/1955    Quit date: 03/09/1993    Years since quitting: 26.2  . Smokeless tobacco: Never Used  Substance and Sexual Activity  . Alcohol use: Yes    Alcohol/week: 7.0 standard drinks    Types: 1 Glasses of wine, 2 Shots of liquor, 4 Cans of beer per week  . Drug use: No  . Sexual activity: Not Currently  Other Topics Concern  . Not on file  Social History Narrative   Owns a Engineer, technical sales, enjoys golf   Lives in [place] Strain: [place]   . Difficulty of Paying Living Expenses: Not hard at all  Food Insecurity: No Food Insecurity  . Worried About Charity fundraiser in the Last Year: Never true  . Ran Out of Food in the Last Year: Never true  Transportation Needs: No Transportation Needs  . Lack of Transportation (Medical): No  . Lack of Transportation (Non-Medical): No  Physical Activity: Sufficiently Active  . Days of Exercise per Week: 7 days  . Minutes of Exercise per Session: 30 min  Stress: No Stress Concern Present  . Feeling of Stress : Not at all  Social Connections: Slightly Isolated  . Frequency of Communication with Friends and Family: More than three times a week  .  Frequency of Social Gatherings with Friends and Family: More than three times a week  . Attends Religious Services: More than 4 times per year  . Active Member of Clubs or Organizations: Yes  . Attends Archivist Meetings: More than 4 times per year  . Marital Status: Widowed  Intimate Partner Violence: Not At Risk  . Fear of Current or Ex-Partner: No  . Emotionally Abused: No  . Physically Abused: No  . Sexually Abused: No    Outpatient Encounter Medications as of 05/19/2019  Medication Sig  . acetaminophen (TYLENOL) 325 MG tablet Take 650 mg by mouth every 6 (six) hours as needed for mild pain.  Marland Kitchen atenolol (TENORMIN) 100 MG  tablet Take 1 tablet (100 mg total) by mouth daily.  . colchicine 0.6 MG tablet Take 0.6 mg by mouth 2 (two) times daily as needed.  Marland Kitchen losartan (COZAAR) 100 MG tablet Take 0.5 tablets (50 mg total) by mouth daily.  . Multiple Vitamin (MULTIVITAMIN WITH MINERALS) TABS tablet Take 1 tablet by mouth daily.  . nitroGLYCERIN (NITROSTAT) 0.4 MG SL tablet Place 1 tablet (0.4 mg total) under the tongue every 5 (five) minutes as needed for chest pain.  Marland Kitchen XARELTO 20 MG TABS tablet Take 1 tablet (20 mg total) by mouth daily with supper.  . zolpidem (AMBIEN) 10 MG tablet Take 0.5 tablets (5 mg total) by mouth at bedtime as needed for sleep.  . [DISCONTINUED] losartan (COZAAR) 100 MG tablet Take 50 mg by mouth daily.   . [DISCONTINUED] zolpidem (AMBIEN) 10 MG tablet Take 5 mg by mouth at bedtime as needed for sleep.    No facility-administered encounter medications on file as of 05/19/2019.    Allergies  Allergen Reactions  . Penicillins Rash    Has patient had a PCN reaction causing immediate rash, facial/tongue/throat swelling, SOB or lightheadedness with hypotension: No Has patient had a PCN reaction causing severe rash involving mucus membranes or skin necrosis: No Has patient had a PCN reaction that required hospitalization No Has patient had a PCN reaction occurring within the last 10 years: No If all of the above answers are "NO", then may proceed with Cephalosporin use.   . Sulfonamide Derivatives Rash    Review of Systems  Constitutional: Negative for activity change, appetite change, chills, diaphoresis, fatigue, fever and unexpected weight change.  HENT: Negative.   Eyes: Negative.  Negative for photophobia and visual disturbance.  Respiratory: Negative for cough, chest tightness and shortness of breath.   Cardiovascular: Negative for chest pain, palpitations and leg swelling.  Gastrointestinal: Negative for abdominal pain, blood in stool, constipation, diarrhea, nausea and vomiting.    Endocrine: Negative.   Genitourinary: Negative for decreased urine volume, difficulty urinating, dysuria, frequency and urgency.  Musculoskeletal: Negative for arthralgias and myalgias.  Skin: Negative.   Allergic/Immunologic: Negative.   Neurological: Negative for dizziness, tremors, seizures, syncope, facial asymmetry, speech difficulty, weakness, light-headedness, numbness and headaches.  Hematological: Negative.   Psychiatric/Behavioral: Positive for sleep disturbance. Negative for agitation, behavioral problems, confusion, decreased concentration, dysphoric mood, hallucinations, self-injury and suicidal ideas. The patient is not nervous/anxious and is not hyperactive.   All other systems reviewed and are negative.       Objective:  BP 132/83   Pulse 66   Temp 99 F (37.2 C)   Ht 5\' 4"  (1.626 m)   Wt 194 lb (88 kg)   SpO2 96%   BMI 33.30 kg/m    Wt Readings from Last 3 Encounters:  05/19/19 194 lb (88 kg)  04/19/19 195 lb 6.4 oz (88.6 kg)  03/06/19 189 lb (85.7 kg)    Physical Exam Vitals and nursing note reviewed.  Constitutional:      General: He is not in acute distress.    Appearance: Normal appearance. He is well-developed and well-groomed. He is obese. He is not ill-appearing, toxic-appearing or diaphoretic.  HENT:     Head: Normocephalic and atraumatic.     Jaw: There is normal jaw occlusion.     Right Ear: Hearing normal.     Left Ear: Hearing normal.     Nose: Nose normal.     Mouth/Throat:     Lips: Pink.     Mouth: Mucous membranes are moist.     Pharynx: Oropharynx is clear. Uvula midline.  Eyes:     General: Lids are normal.     Extraocular Movements: Extraocular movements intact.     Conjunctiva/sclera: Conjunctivae normal.     Pupils: Pupils are equal, round, and reactive to light.  Neck:     Thyroid: No thyroid mass, thyromegaly or thyroid tenderness.     Vascular: No carotid bruit or JVD.     Trachea: Trachea and phonation normal.   Cardiovascular:     Rate and Rhythm: Normal rate and regular rhythm.     Chest Wall: PMI is not displaced.     Pulses: Normal pulses.     Heart sounds: Normal heart sounds. No murmur. No friction rub. No gallop.   Pulmonary:     Effort: Pulmonary effort is normal. No respiratory distress.     Breath sounds: Normal breath sounds. No wheezing.  Abdominal:     General: Bowel sounds are normal. There is no distension or abdominal bruit.     Palpations: Abdomen is soft. There is no hepatomegaly or splenomegaly.     Tenderness: There is no abdominal tenderness. There is no right CVA tenderness or left CVA tenderness.     Hernia: No hernia is present.  Musculoskeletal:        General: Normal range of motion.     Cervical back: Normal range of motion and neck supple.     Right lower leg: No edema.     Left lower leg: No edema.  Lymphadenopathy:     Cervical: No cervical adenopathy.  Skin:    General: Skin is warm and dry.     Capillary Refill: Capillary refill takes less than 2 seconds.     Coloration: Skin is not cyanotic, jaundiced or pale.     Findings: No rash.  Neurological:     General: No focal deficit present.     Mental Status: He is alert and oriented to person, place, and time.     Cranial Nerves: Cranial nerves are intact. No cranial nerve deficit.     Sensory: Sensation is intact. No sensory deficit.     Motor: Motor function is intact. No weakness.     Coordination: Coordination is intact. Coordination normal.     Gait: Gait is intact. Gait normal.     Deep Tendon Reflexes: Reflexes are normal and symmetric. Reflexes normal.  Psychiatric:        Attention and Perception: Attention and perception normal.        Mood and Affect: Mood and affect normal.        Speech: Speech normal.        Behavior: Behavior normal. Behavior is cooperative.        Thought  Content: Thought content normal.        Cognition and Memory: Cognition and memory normal.        Judgment: Judgment  normal.     Results for orders placed or performed in visit on 05/09/19  CUP PACEART REMOTE DEVICE CHECK  Result Value Ref Range   Date Time Interrogation Session [id number]    Pulse Generator Manufacturer [employer]    Pulse Gen Model 2272 Assurity MRI    Pulse Gen Serial Number [id number]    Clinic Name [facility] Pulse Generator Type Implantable Pulse Generator    Implantable Pulse Generator Implant Date [id number]    Implantable Lead Manufacturer [facility]    Implantable Lead Model 1646T IsoFlex S    Implantable Lead Serial Number [id number]    Implantable Lead Implant Date IO:[id number]    Implantable Lead Location Detail 1 UNKNOWN    Implantable Lead Location [id number]    Implantable Lead Manufacturer [employer]    Implantable Lead Model 1242T Passive Plus    Implantable Lead Serial Number [id number]    Implantable Lead Implant Date UW:[id number]    Implantable Lead Location Detail 1 UNKNOWN    Implantable Lead Location [id number]    Lead Channel Setting Sensing Sensitivity 2.0 mV   Lead Channel Setting Sensing Adaptation Mode Fixed Pacing    Lead Channel Setting Pacing Pulse Width 0.4 ms   Lead Channel Setting Pacing Amplitude 2.5 V   Lead Channel Status NULL    Lead Channel Impedance Value 700 ohm   Lead Channel Sensing Intrinsic Amplitude 12.0 mV   Lead Channel Pacing Threshold Amplitude 0.5 V   Lead Channel Pacing Threshold Pulse Width 0.4 ms   Battery Status MOS    Battery Remaining Longevity 134 mo   Battery Remaining Percentage 95.5 %   Battery Voltage 3.04 V   Brady Statistic RV Percent Paced 34.0 %       Pertinent labs & imaging results that were available during my care of the patient were reviewed by me and considered in my medical decision making.  Assessment & Plan:  Taesean was seen today for medical management of chronic issues and hypertension.  Diagnoses and all orders for this visit:  Essential hypertension with goal blood pressure less than 130/80 BP well  controlled. Changes were not made in regimen today. Goal BP is 130/80. Pt aware to report any persistent high or low readings. DASH diet and exercise encouraged. Exercise at least 150 minutes per week and increase as tolerated. Goal BMI > 25. Stress management encouraged. Avoid nicotine and tobacco product use. Avoid excessive alcohol and NSAID's. Avoid more than 2000 mg of sodium daily. Medications as prescribed. Follow up as scheduled.  -     losartan (COZAAR) 100 MG tablet; Take 0.5 tablets (50 mg total) by mouth daily.  Primary insomnia Controlled substance agreement signed Doing well with below, will continue.  -     zolpidem (AMBIEN) 10 MG tablet; Take 0.5 tablets (5 mg total) by mouth at bedtime as needed for sleep. -     ToxASSURE Select 13 (MW), Urine     Continue all other maintenance medications.  Follow up plan: Return in about 3 months (around 08/19/2019), or if symptoms worsen or fail to improve, for HTN.  Continue healthy lifestyle choices, including diet (rich in fruits, vegetables, and lean proteins, and low in salt and simple carbohydrates) and exercise (at least 30 minutes of moderate physical activity daily).  Educational handout given for  insomnia  The above assessment and management plan was discussed with the patient. The patient verbalized understanding of and has agreed to the management plan. Patient is aware to call the clinic if they develop any new symptoms or if symptoms persist or worsen. Patient is aware when to return to the clinic for a follow-up visit. Patient educated on when it is appropriate to go to the emergency department.   Monia Pouch, FNP-C [facility] [phone]

## 2019-05-19 NOTE — Patient Instructions (Signed)

## 2019-05-24 LAB — TOXASSURE SELECT 13 (MW), URINE

## 2019-06-05 ENCOUNTER — Encounter: Payer: Self-pay | Admitting: Family Medicine

## 2019-06-05 ENCOUNTER — Ambulatory Visit (INDEPENDENT_AMBULATORY_CARE_PROVIDER_SITE_OTHER): Payer: Medicare HMO

## 2019-06-05 ENCOUNTER — Other Ambulatory Visit: Payer: Self-pay

## 2019-06-05 ENCOUNTER — Ambulatory Visit (INDEPENDENT_AMBULATORY_CARE_PROVIDER_SITE_OTHER): Payer: Medicare HMO | Admitting: Family Medicine

## 2019-06-05 VITALS — BP 135/84 | HR 68 | Temp 99.6°F | Ht 64.0 in | Wt 195.2 lb

## 2019-06-05 DIAGNOSIS — M79632 Pain in left forearm: Secondary | ICD-10-CM

## 2019-06-05 DIAGNOSIS — R937 Abnormal findings on diagnostic imaging of other parts of musculoskeletal system: Secondary | ICD-10-CM | POA: Diagnosis not present

## 2019-06-05 DIAGNOSIS — L03114 Cellulitis of left upper limb: Secondary | ICD-10-CM

## 2019-06-05 MED ORDER — DOXYCYCLINE HYCLATE 100 MG PO TABS: 100.0000 mg | ORAL_TABLET | Freq: Two times a day (BID) | ORAL | 0 refills | Status: AC

## 2019-06-05 NOTE — Patient Instructions (Signed)

## 2019-06-05 NOTE — Progress Notes (Signed)
Subjective:  Patient ID: Peter Howe, male    DOB: 1940-08-14, 79 y.o.   MRN: [id number]  Patient Care Team: Theodoro Clock as PCP - General (Physician Assistant) [name], MD as PCP - Electrophysiology (Cardiology)   Chief Complaint:  Cyst (left forearm knots?)   HPI: Peter Howe is a 79 y.o. male presenting on 06/05/2019 for Cyst (left forearm knots?)   Pt presents today for evaluation of left forearm lesion and knot. 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 He reports he has had several procedures at the dermatology office and is scheduled to have another soon. States he has redness around the procedure site and a knot that is tender proximal to the procedure site. States he has been washing the wound and using polysporin as instructed. No oral antibiotics. No fever, chills, weakness, or weight changes. Denies injury to arm or previous injury to arm.      Relevant past medical, surgical, family, and social history reviewed and updated as indicated.  Allergies and medications reviewed and updated. Date reviewed: Chart in Epic.   Past Medical History:  Diagnosis Date  . Hypertension   . Overweight(278.02)    obesity  . Permanent atrial fibrillation ([place])   . 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 ARM SKIN LESION BIOPSY / EXCISION Left    Melanoma -removed by Dr. Nevada Crane  . PACEMAKER INSERTION  03/28/03   St. Jude-PPM  . PPM GENERATOR CHANGEOUT N/A 11/24/2018   Procedure: PPM GENERATOR CHANGEOUT;  Surgeon: [name], MD;  Location: [facility];  Service: Cardiovascular;  Laterality: N/A;    Social History   Socioeconomic History  . Marital status: Widowed    Spouse name: Not on file  . Number of children: 1  . Years of education: 78  . Highest education level: High school graduate  Occupational History  . Occupation: runs a Engineer, technical sales    Comment: part time  Tobacco Use  . Smoking status: Former Smoker    Packs/day: 1.00    Years: 30.00    Pack years: 30.00    Types: Cigarettes    Start date: 03/30/1955    Quit date: 03/09/1993    Years since quitting: 26.2  . Smokeless tobacco: Never Used  Substance and Sexual Activity  . Alcohol use: Yes    Alcohol/week: 7.0 standard drinks    Types: 1 Glasses of wine, 2 Shots of liquor, 4 Cans of beer per week  . Drug use: No  . Sexual activity: Not Currently  Other Topics Concern  . Not on file  Social History Narrative   Owns a Engineer, technical sales, enjoys golf   Lives in [place]rain: [place]   . Difficulty of Paying Living Expenses: Not hard at all  Food Insecurity: No Food Insecurity  . Worried About Charity fundraiser in the Last Year: Never true  . Ran Out of Food in the Last Year: Never true  Transportation Needs: No Transportation Needs  . Lack of Transportation (Medical): No  . Lack of Transportation (Non-Medical): No  Physical Activity: Sufficiently Active  . Days of Exercise per Week: 7 days  . Minutes of Exercise per Session: 30 min  Stress: No Stress Concern Present  . Feeling of Stress : Not at all  Social  Connections: Slightly Isolated  . Frequency of Communication with Friends and Family: More than three times a week  . Frequency of Social Gatherings with Friends and Family: More than three times a week  . Attends Religious Services: More than 4 times per year  . Active Member of Clubs or Organizations: Yes  . Attends Archivist Meetings: More than 4 times per year  . Marital Status: Widowed  Intimate Partner Violence: Not At Risk  . Fear of Current or Ex-Partner: No  . Emotionally Abused: No  . Physically Abused: No  . Sexually Abused: No    Outpatient Encounter Medications as of 06/05/2019  Medication Sig  . acetaminophen (TYLENOL) 325 MG tablet Take 650 mg by mouth every 6 (six)  hours as needed for mild pain.  Marland Kitchen atenolol (TENORMIN) 100 MG tablet Take 1 tablet (100 mg total) by mouth daily.  . colchicine 0.6 MG tablet Take 0.6 mg by mouth 2 (two) times daily as needed.  Marland Kitchen losartan (COZAAR) 100 MG tablet Take 0.5 tablets (50 mg total) by mouth daily.  . Multiple Vitamin (MULTIVITAMIN WITH MINERALS) TABS tablet Take 1 tablet by mouth daily.  . nitroGLYCERIN (NITROSTAT) 0.4 MG SL tablet Place 1 tablet (0.4 mg total) under the tongue every 5 (five) minutes as needed for chest pain.  Marland Kitchen XARELTO 20 MG TABS tablet Take 1 tablet (20 mg total) by mouth daily with supper.  . zolpidem (AMBIEN) 10 MG tablet Take 0.5 tablets (5 mg total) by mouth at bedtime as needed for sleep.  Marland Kitchen doxycycline (VIBRA-TABS) 100 MG tablet Take 1 tablet (100 mg total) by mouth 2 (two) times daily for 10 days. 1 po bid   No facility-administered encounter medications on file as of 06/05/2019.    Allergies  Allergen Reactions  . Penicillins Rash    Has patient had a PCN reaction causing immediate rash, facial/tongue/throat swelling, SOB or lightheadedness with hypotension: No Has patient had a PCN reaction causing severe rash involving mucus membranes or skin necrosis: No Has patient had a PCN reaction that required hospitalization No Has patient had a PCN reaction occurring within the last 10 years: No If all of the above answers are "NO", then may proceed with Cephalosporin use.   . Sulfonamide Derivatives Rash    Review of Systems  Constitutional: Negative for activity change, appetite change, chills, diaphoresis, fatigue, fever and unexpected weight change.  HENT: Negative.   Eyes: Negative.  Negative for photophobia and visual disturbance.  Respiratory: Negative for cough, chest tightness and shortness of breath.   Cardiovascular: Negative for chest pain, palpitations and leg swelling.  Gastrointestinal: Negative for abdominal pain, blood in stool, constipation, diarrhea, nausea and vomiting.    Endocrine: Negative.   Genitourinary: Negative for decreased urine volume, difficulty urinating, dysuria, frequency and urgency.  Musculoskeletal: Positive for arthralgias and myalgias.  Skin: Positive for color change and wound.  Allergic/Immunologic: Negative.   Neurological: Negative for dizziness, tremors, seizures, syncope, facial asymmetry, speech difficulty, weakness, light-headedness, numbness and headaches.  Hematological: Negative.   Psychiatric/Behavioral: Negative for confusion, hallucinations, sleep disturbance and suicidal ideas.  All other systems reviewed and are negative.       Objective:  BP 135/84   Pulse 68   Temp 99.6 F (37.6 C)   Ht 5\' 4"  (1.626 m)   Wt 195 lb 3.2 oz (88.5 kg)   SpO2 93%   BMI 33.51 kg/m    Wt Readings from Last 3 Encounters:  06/05/19 195 lb  3.2 oz (88.5 kg)  05/19/19 194 lb (88 kg)  04/19/19 195 lb 6.4 oz (88.6 kg)    Physical Exam Vitals and nursing note reviewed.  Constitutional:      General: He is not in acute distress.    Appearance: Normal appearance. He is well-developed and well-groomed. He is not ill-appearing, toxic-appearing or diaphoretic.  HENT:     Head: Normocephalic and atraumatic.     Jaw: There is normal jaw occlusion.     Right Ear: Hearing normal.     Left Ear: Hearing normal.     Nose: Nose normal.     Mouth/Throat:     Lips: Pink.     Mouth: Mucous membranes are moist.     Pharynx: Oropharynx is clear. Uvula midline.  Eyes:     General: Lids are normal.     Extraocular Movements: Extraocular movements intact.     Conjunctiva/sclera: Conjunctivae normal.     Pupils: Pupils are equal, round, and reactive to light.  Neck:     Thyroid: No thyroid mass, thyromegaly or thyroid tenderness.     Vascular: No carotid bruit or JVD.     Trachea: Trachea and phonation normal.  Cardiovascular:     Rate and Rhythm: Normal rate and regular rhythm.     Chest Wall: PMI is not displaced.     Pulses: Normal pulses.      Heart sounds: Normal heart sounds. No murmur. No friction rub. No gallop.   Pulmonary:     Effort: Pulmonary effort is normal. No respiratory distress.     Breath sounds: Normal breath sounds. No wheezing.  Abdominal:     General: Bowel sounds are normal. There is no distension or abdominal bruit.     Palpations: Abdomen is soft. There is no hepatomegaly or splenomegaly.     Tenderness: There is no abdominal tenderness. There is no right CVA tenderness or left CVA tenderness.     Hernia: No hernia is present.  Musculoskeletal:        General: Normal range of motion.     Left elbow: Normal.     Left forearm: Deformity and tenderness present. No swelling, edema, lacerations or bony tenderness.     Left wrist: Normal.     Cervical back: Normal range of motion and neck supple.     Right lower leg: No edema.     Left lower leg: No edema.  Lymphadenopathy:     Cervical: No cervical adenopathy.  Skin:    General: Skin is warm and dry.     Capillary Refill: Capillary refill takes less than 2 seconds.     Coloration: Skin is not cyanotic, jaundiced or pale.     Findings: No rash.       Neurological:     General: No focal deficit present.     Mental Status: He is alert and oriented to person, place, and time.     Cranial Nerves: Cranial nerves are intact.     Sensory: Sensation is intact.     Motor: Motor function is intact.     Coordination: Coordination is intact.     Gait: Gait is intact.     Deep Tendon Reflexes: Reflexes are normal and symmetric.  Psychiatric:        Attention and Perception: Attention and perception normal.        Mood and Affect: Mood and affect normal.        Speech: Speech normal.  Behavior: Behavior normal. Behavior is cooperative.        Thought Content: Thought content normal.        Cognition and Memory: Cognition and memory normal.        Judgment: Judgment normal.     Results for orders placed or performed in visit on 05/19/19  ToxASSURE  Select 13 (MW), Urine  Result Value Ref Range   Summary Note      X-Ray: left forearm: area of concern proximal to skin biopsy site, bony growth / abnormality. Preliminary x-ray reading by Monia Pouch, FNP-C, [facility].   Pertinent labs & imaging results that were available during my care of the patient were reviewed by me and considered in my medical decision making.  Assessment & Plan:  Urbain was seen today for cyst.  Diagnoses and all orders for this visit:  Left forearm pain Abnormal bone radiograph Abnormal imaging finding resembling bony growth to proximal radius. Will refer to ortho for further evaluation.  -     DG Forearm Left; Future -     doxycycline (VIBRA-TABS) 100 MG tablet; Take 1 tablet (100 mg total) by mouth 2 (two) times daily for 10 days. 1 po bid -     Ambulatory referral to Orthopedic Surgery  Cellulitis of left forearm Cellulitis around skin biopsy site. Will treat with below. Pt aware to report any new, worsening, or persistent symptoms.  -     DG Forearm Left; Future -     doxycycline (VIBRA-TABS) 100 MG tablet; Take 1 tablet (100 mg total) by mouth 2 (two) times daily for 10 days. 1 po bid     Continue all other maintenance medications.  Follow up plan: Return if symptoms worsen or fail to improve.   Continue healthy lifestyle choices, including diet (rich in fruits, vegetables, and lean proteins, and low in salt and simple carbohydrates) and exercise (at least 30 minutes of moderate physical activity daily).  Educational handout given for cellulitis  The above assessment and management plan was discussed with the patient. The patient verbalized understanding of and has agreed to the management plan. Patient is aware to call the clinic if they develop any new symptoms or if symptoms persist or worsen. Patient is aware when to return to the clinic for a follow-up visit. Patient educated on when it is appropriate to go to the emergency department.    Monia Pouch, FNP-C [facility] [phone]

## 2019-06-08 ENCOUNTER — Other Ambulatory Visit: Payer: Self-pay

## 2019-06-08 ENCOUNTER — Encounter: Payer: Self-pay | Admitting: Orthopaedic Surgery

## 2019-06-08 ENCOUNTER — Ambulatory Visit: Payer: Medicare HMO | Admitting: Orthopaedic Surgery

## 2019-06-08 DIAGNOSIS — R2232 Localized swelling, mass and lump, left upper limb: Secondary | ICD-10-CM

## 2019-06-08 NOTE — Progress Notes (Signed)
Office Visit Note   Patient: Peter Howe           Date of Birth: 1940/05/28           MRN: [id number] Visit Date: 06/08/2019              Requested by: Baruch Gouty, [place],  [place] [address] PCP: Terald Sleeper, PA-C   Assessment & Plan: Visit Diagnoses:  1. Forearm mass, left     Plan: Mr. Diekmann has had several skin biopsies of his left forearm for low-grade cancer.  His recent biopsies have been "negative".  He still has a healing ulcer from his biopsy and recently felt some bony protuberances in the mid ulna.  Is been referred for further evaluation.  Films were essentially nondiagnostic there is some bony hypertrophy just distal to the skin lesion but no tenderness.  I suspect that is normal.  However, with his history of cancer I will order a CT scan of his forearm.  He has a pacemaker  Follow-Up Instructions: Return After CT scan left forearm.   Orders:  Orders Placed This Encounter  Procedures  . CT FOREARM LEFT WO CONTRAST   No orders of the defined types were placed in this encounter.     Procedures: No procedures performed   Clinical Data: No additional findings.   Subjective: Chief Complaint  Patient presents with  . Arm Pain    Left  Patient presents today for his left forearm. He said that he had an area of skin cancer cut out from his midforearm in December of 2020. He has noticed that since then he has "knots" along on his forearm under the skin, some near the surgical excision area and others not. He said that it does not hurt. He saw his PCP and had x-rays taken of his forearm on 06/05/2019. He wanted to make sure there was nothing to be concerned about. He is right hand dominant.  I did review the films and did not see any specific pathologic bony lesions particularly in the area of his skin biopsy.  Radiologist agreed.  However, just to be sure I will order a CT scan of the forearm HPI  Review of Systems  Constitutional:  Negative for fatigue.  HENT: Negative for ear pain.   Eyes: Negative for pain.  Respiratory: Negative for shortness of breath.   Gastrointestinal: Negative for constipation and diarrhea.  Endocrine: Negative for cold intolerance and heat intolerance.  Genitourinary: Negative for difficulty urinating.  Musculoskeletal: Negative for joint swelling.  Skin: Negative for rash.  Allergic/Immunologic: Negative for food allergies.  Neurological: Negative for weakness.  Hematological: Does not bruise/bleed easily.  Psychiatric/Behavioral: Negative for sleep disturbance.     Objective: Vital Signs: Ht 5\' 4"  (1.626 m)   Wt 195 lb (88.5 kg)   BMI 33.47 kg/m   Physical Exam Constitutional:      Appearance: He is well-developed.  Eyes:     Pupils: Pupils are equal, round, and reactive to light.  Pulmonary:     Effort: Pulmonary effort is normal.  Skin:    General: Skin is warm and dry.  Neurological:     Mental Status: He is alert and oriented to person, place, and time.  Psychiatric:        Behavior: Behavior normal.     Ortho Exam left forearm with a healing ulcer from a prior skin biopsy.  The lesion measures about a centimeter diameter.  Somewhat adherent to the deep tissue there is some hypertrophic tissue surrounding the biopsy site there are some bony protuberances along the ulna just distal to the lesion but they are totally asymptomatic.  There is some limited range of motion of his left wrist.  Has not reviewed his forearm films there is evidence of arthritis which probably limits his wrist motion.  No pain.  Specialty Comments:  No specialty comments available.  Imaging: No results found.   PMFS History: Patient Active Problem List   Diagnosis Date Noted  . Forearm mass, left 06/08/2019  . 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 Controlled substance agreement signed 05/19/2019  . Elevated liver enzymes 06/08/2017  . Epigastric pain 06/04/2017  . 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  Abdominal distension   . Hyperglycemia 03/26/2016  . Abnormal EKG   . Non-intractable vomiting with nausea   . Gastroesophageal reflux disease 02/05/2016  . Chest pain 11/05/2015  . Abdominal pain 11/05/2015  . Essential hypertension with goal blood pressure less than 130/80 09/18/2013  . Shortness of breath 08/31/2012  . ATRIAL FIBRILLATION 01/02/2010  . BMI 33.0-33.9,adult 12/05/2008  . Essential hypertension 12/05/2008  . 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  . PACEMAKER, PERMANENT 12/05/2008   Past Medical History:  Diagnosis Date  . Hypertension   . Overweight(278.02)    obesity  . Permanent atrial fibrillation ([place])   . 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 Cancer Mother 52  . CAD Father 86  . Heart disease Daughter   . Stroke Sister   . Cancer Sister   . Cancer Sister   . Diabetes Brother   . Cancer Brother   . Alcohol abuse Brother     Past Surgical History:  Procedure Laterality Date  . ARM SKIN LESION BIOPSY / EXCISION Left    Melanoma -removed by Dr. Nevada Crane  . PACEMAKER INSERTION  03/28/03   St. Jude-PPM  . PPM GENERATOR CHANGEOUT N/A 11/24/2018   Procedure: PPM GENERATOR CHANGEOUT;  Surgeon: [name], MD;  Location: [facility];  Service: Cardiovascular;  Laterality: N/A;   Social History   Occupational History  . Occupation: runs a Engineer, technical sales    Comment: part time  Tobacco Use  . Smoking status: Former Smoker    Packs/day: 1.00    Years: 30.00    Pack years: 30.00    Types: Cigarettes    Start date: 03/30/1955    Quit date: 03/09/1993    Years since quitting: 26.2  . Smokeless tobacco: Never Used  Substance and Sexual Activity  . Alcohol use: Yes    Alcohol/week: 7.0 standard drinks    Types: 1 Glasses of wine, 2 Shots of liquor, 4 Cans of beer per week  . Drug use: No  . Sexual activity: Not Currently

## 2019-06-21 ENCOUNTER — Other Ambulatory Visit: Payer: Medicare HMO

## 2019-06-21 ENCOUNTER — Telehealth: Payer: Self-pay

## 2019-06-21 ENCOUNTER — Other Ambulatory Visit: Payer: Self-pay

## 2019-06-21 ENCOUNTER — Other Ambulatory Visit: Payer: Self-pay | Admitting: Orthopaedic Surgery

## 2019-06-21 ENCOUNTER — Telehealth: Payer: Self-pay | Admitting: Orthopaedic Surgery

## 2019-06-21 ENCOUNTER — Ambulatory Visit
Admission: RE | Admit: 2019-06-21 | Discharge: 2019-06-21 | Disposition: A | Discharge status: Home / Self Care | Payer: Medicare HMO | Source: Ambulatory Visit | Attending: Orthopaedic Surgery | Admitting: Orthopaedic Surgery

## 2019-06-21 DIAGNOSIS — R2232 Localized swelling, mass and lump, left upper limb: Secondary | ICD-10-CM

## 2019-06-21 MED ORDER — DIAZEPAM 5 MG PO TABS: ORAL_TABLET | ORAL | 0 refills | Status: DC

## 2019-06-21 NOTE — Telephone Encounter (Signed)
Tried to call patient. No answer. Left message for patient to call me back so that we can discuss.

## 2019-06-21 NOTE — Telephone Encounter (Signed)
Hailey from [facility] called and states patient was unable to do CT scan. States he is claust. Asked her did we need to put in a  New order in for an open one. States patient said ""leave it as is"  They would like for you to call patient to discuss.

## 2019-06-21 NOTE — Telephone Encounter (Signed)
Valium 5mg  30 min prior to exam-2 pills

## 2019-06-21 NOTE — Telephone Encounter (Signed)
Patient called.   He is requesting that he be given a medicine to help him relax through his CT scan.   Call back: [phone]

## 2019-06-21 NOTE — Telephone Encounter (Signed)
Spoke with patient. What would you like for me to send to his pharmacy to take prior to the CT scan?

## 2019-06-21 NOTE — Telephone Encounter (Signed)
Called in Valium to pharmacy. Patient aware and also advised patient to call Imaging facility to reschedule CT scan since he was unable to do it at the initial appointment.

## 2019-07-07 ENCOUNTER — Ambulatory Visit
Admission: RE | Admit: 2019-07-07 | Discharge: 2019-07-07 | Disposition: A | Discharge status: Home / Self Care | Payer: Medicare HMO | Source: Ambulatory Visit | Attending: Orthopaedic Surgery | Admitting: Orthopaedic Surgery

## 2019-07-07 DIAGNOSIS — R2232 Localized swelling, mass and lump, left upper limb: Secondary | ICD-10-CM

## 2019-07-10 ENCOUNTER — Telehealth: Payer: Self-pay | Admitting: Orthopaedic Surgery

## 2019-07-10 ENCOUNTER — Encounter: Payer: Self-pay | Admitting: *Deleted

## 2019-07-10 NOTE — Telephone Encounter (Signed)
Patient called advised he still could not get the MRI done even after taking the valium. Patient said he is claustrophobic and just could not do it. The number to contact patient is [phone]

## 2019-07-10 NOTE — Telephone Encounter (Signed)
Any thoughts? 

## 2019-07-11 NOTE — Telephone Encounter (Signed)
Hey! I called and spoke with [facility] regarding patient's refusal of CT scan due to claustrophobia. She said that patient would need to go to the hospital and have sedation administered while there with a nurse. Is there a certain way this needs to be ordered or a specific location? Also, shouldn't this be done with contrast since Dr.Whitfield is evaluating a mass? Please advise what I need to do. Thanks!

## 2019-07-11 NOTE — Telephone Encounter (Signed)
Call radiology and see if they have any recommendations

## 2019-07-11 NOTE — Telephone Encounter (Signed)
CT with contrast

## 2019-07-11 NOTE — Telephone Encounter (Signed)
It was a CT with contrast that patient couldn't complete.

## 2019-07-12 NOTE — Telephone Encounter (Signed)
Hey, sorry I was out yesterday after 1pm, you will need to just put in the order for sedation (I can do it if need to, and then show you) and yes it will need to be with contrast since its a mass he is wanting to evaluate. If you need anything let me know and I will be happy to help

## 2019-07-13 ENCOUNTER — Other Ambulatory Visit: Payer: Self-pay

## 2019-07-13 DIAGNOSIS — R2232 Localized swelling, mass and lump, left upper limb: Secondary | ICD-10-CM

## 2019-07-13 NOTE — Telephone Encounter (Signed)
Ordered CT with contrast to be done at [facility] with IV sedation.

## 2019-07-13 NOTE — Progress Notes (Signed)
CT

## 2019-07-18 NOTE — Telephone Encounter (Signed)
Sedation order form has been faxed to [facility], they will will contact pt to scehdule

## 2019-08-08 ENCOUNTER — Ambulatory Visit (INDEPENDENT_AMBULATORY_CARE_PROVIDER_SITE_OTHER): Payer: Medicare HMO | Admitting: *Deleted

## 2019-08-08 ENCOUNTER — Telehealth: Payer: Self-pay | Admitting: Orthopaedic Surgery

## 2019-08-08 DIAGNOSIS — I495 Sick sinus syndrome: Secondary | ICD-10-CM

## 2019-08-08 NOTE — Telephone Encounter (Signed)
Called patient left message to return call to schedule an appointment with Dr. Whitfield for MRI review 

## 2019-08-09 LAB — CUP PACEART REMOTE DEVICE CHECK
Battery Remaining Longevity: 133 mo
Battery Remaining Percentage: 95.5 %
Battery Voltage: 3.02 V
Brady Statistic RV Percent Paced: 36 %
Date Time Interrogation Session: 20210602000239
Implantable Lead Implant Date: 19980120
Implantable Lead Implant Date: 20050119
Implantable Lead Location: 753859
Implantable Lead Location: 753860
Implantable Pulse Generator Implant Date: 20200917
Lead Channel Impedance Value: 760 Ohm
Lead Channel Pacing Threshold Amplitude: 0.5 V
Lead Channel Pacing Threshold Pulse Width: 0.4 ms
Lead Channel Sensing Intrinsic Amplitude: 12 mV
Lead Channel Setting Pacing Amplitude: 2.5 V
Lead Channel Setting Pacing Pulse Width: 0.4 ms
Lead Channel Setting Sensing Sensitivity: 2 mV
Pulse Gen Model: 2272
Pulse Gen Serial Number: 9154140

## 2019-08-09 NOTE — Progress Notes (Signed)
Remote pacemaker transmission.   

## 2019-08-19 ENCOUNTER — Other Ambulatory Visit (HOSPITAL_COMMUNITY)
Admission: RE | Admit: 2019-08-19 | Discharge: 2019-08-19 | Disposition: A | Discharge status: Home / Self Care | Payer: Medicare HMO | Source: Ambulatory Visit | Attending: Orthopaedic Surgery | Admitting: Orthopaedic Surgery

## 2019-08-19 DIAGNOSIS — Z01812 Encounter for preprocedural laboratory examination: Secondary | ICD-10-CM | POA: Diagnosis present

## 2019-08-19 DIAGNOSIS — Z20822 Contact with and (suspected) exposure to covid-19: Secondary | ICD-10-CM | POA: Diagnosis not present

## 2019-08-19 LAB — SARS CORONAVIRUS 2 (TAT 6-24 HRS): SARS Coronavirus 2: NEGATIVE

## 2019-08-21 ENCOUNTER — Encounter (HOSPITAL_COMMUNITY): Payer: Self-pay | Admitting: Physician Assistant

## 2019-08-21 ENCOUNTER — Telehealth: Payer: Self-pay

## 2019-08-21 NOTE — Telephone Encounter (Signed)
Tried to call patient. No answer. Left message with the following information. His CT scan of his forearm for tomorrow morning at the hospital has been cancelled, per anesthesia. I received notification of this final decision at 4:50pm on the day before his scheduled appointment there at the hospital.  He will first need to come in for an H&P with Dr.Whitfield or Aaron Edelman before being scheduled for this CT scan with sedation, since it has been more than 30days since we last saw him in the office. It then needs to be posted as a surgical case since it requires MAC (monitored anesthesia care).

## 2019-08-21 NOTE — Progress Notes (Addendum)
Spoke with scheduler at Dr. Rudene Anda office to clarify reason for CT under general anesthesia. 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 I let them know that it is currently posted for GA and they should reach out to radiology to try to arrange scan with sedation. I also advised that If the patient were to require a scan under anesthesia he would need an updated H&P within 30 days of procedure which I do not believe he currently has.  Addendum 08/21/19: Per Lendon Collar at Dr. Rudene Anda office, radiology does not perform IV sedation for adult patients. Case discussed with Drs. Odonno and Colerain. Case can be done under MAC but pt needs to be seen by physician for up to date H&P within 30 days of procedure.  Wynonia Musty [facility] Short Stay Center/Anesthesiology Phone [phone] 08/21/2019 3:23 PM

## 2019-08-22 ENCOUNTER — Ambulatory Visit (HOSPITAL_COMMUNITY): Admission: RE | Admit: 2019-08-22 | Payer: Medicare HMO | Source: Home / Self Care

## 2019-08-22 ENCOUNTER — Telehealth: Payer: Self-pay | Admitting: Orthopaedic Surgery

## 2019-08-22 ENCOUNTER — Encounter (HOSPITAL_COMMUNITY): Payer: Self-pay

## 2019-08-22 ENCOUNTER — Ambulatory Visit (HOSPITAL_COMMUNITY): Admission: RE | Admit: 2019-08-22 | Payer: Medicare HMO | Source: Ambulatory Visit

## 2019-08-22 SURGERY — CT WITH ANESTHESIA
Anesthesia: General | Laterality: Left

## 2019-08-22 NOTE — Telephone Encounter (Signed)
Spoke with Dr.Whitfield concerning all the details of his CT scan. Patient is scheduled to come in for an office visit on the 23rd to discuss the next steps for the mass in his forearm, since getting any imaging is not possible with patient's claustrophobia.

## 2019-08-22 NOTE — Telephone Encounter (Signed)
Pt called stating his appt had been cancelled and he was supposed to set an OV with Dr.Whitfield; I left the appt for 08/30/19 since that was Dr. Wonda Horner first available.

## 2019-08-30 ENCOUNTER — Encounter: Payer: Self-pay | Admitting: Orthopaedic Surgery

## 2019-08-30 ENCOUNTER — Other Ambulatory Visit: Payer: Self-pay

## 2019-08-30 ENCOUNTER — Ambulatory Visit (INDEPENDENT_AMBULATORY_CARE_PROVIDER_SITE_OTHER): Payer: Medicare HMO | Admitting: Orthopaedic Surgery

## 2019-08-30 VITALS — BP 121/74 | HR 59 | Ht 64.0 in | Wt 195.4 lb

## 2019-08-30 DIAGNOSIS — R2232 Localized swelling, mass and lump, left upper limb: Secondary | ICD-10-CM | POA: Diagnosis not present

## 2019-08-30 NOTE — Progress Notes (Signed)
Office Visit Note   Patient: Peter Howe           Date of Birth: 1941-01-23           MRN: [id number] Visit Date: 08/30/2019              Requested by: Terald Sleeper, PA-C [address] [place],  [place] [address] PCP: Terald Sleeper, PA-C   Assessment & Plan: Visit Diagnoses:  1. 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  He is obviously concerned that this may have something to do with the skin cancer.  X-rays were nondiagnostic and did not demonstrate any problem with the ulnar.  However, because of his concern I had ordered an MRI scan.  Because of his claustrophobia he was unable to proceed with the study.  He had a similar repeat issue with the CT scan.  Based on his x-rays and his exam I do not think there is any problem with with any bony lesions but I did I think it is worth alleviating his concern with the bone scan.  I did I know think that should be an issue in terms of his claustrophobia.  Will order an see him back afterwards  Follow-Up Instructions: Return After bone scan left forearm.   Orders:  Orders Placed This Encounter  Procedures   NM Bone Scan 3 Phase   No orders of the defined types were placed in this encounter.     Procedures: No procedures performed   Clinical Data: No additional findings.   Subjective: Chief Complaint  Patient presents with   Left Forearm - Pain  Peter Howe has had a real issue with proceeding with an MRI scan and CT scan of his left forearm to rule out any pathology about the ulna where he has experienced ridges.  Because of his claustrophobia he could not complete either of the above studies.  HPI  Review of Systems   Objective: Vital Signs: BP 121/74    Pulse (!) 59    Ht 5\' 4"  (1.626 m)    Wt 195 lb 6.4 oz (88.6 kg)    BMI 33.54 kg/m   Physical Exam Constitutional:      Appearance: He is well-developed.   Eyes:     Pupils: Pupils are equal, round, and reactive to light.  Pulmonary:     Effort: Pulmonary effort is normal.  Skin:    General: Skin is warm and dry.  Neurological:     Mental Status: He is alert and oriented to person, place, and time.  Psychiatric:        Behavior: Behavior normal.     Ortho Exam left forearm with prior skin biopsies which have completely healed.  He has a little bit of ridging in the mid ulna but no pain.  No skin changes.  Normal motion of his wrist and elbow.  Good grip and good release.  Specialty Comments:  No specialty comments available.  Imaging: No results found.   PMFS History: Patient Active Problem List   Diagnosis Date Noted   Forearm mass, left 06/08/2019   Primary insomnia 05/19/2019   Controlled substance agreement signed 05/19/2019   Elevated liver enzymes 06/08/2017   Epigastric pain 06/04/2017   Pacemaker 06/04/2017   Abdominal distension    Hyperglycemia 03/26/2016   Abnormal EKG    Non-intractable  vomiting with nausea    Gastroesophageal reflux disease 02/05/2016   Chest pain 11/05/2015   Abdominal pain 11/05/2015   Essential hypertension with goal blood pressure less than 130/80 09/18/2013   Shortness of breath 08/31/2012   ATRIAL FIBRILLATION 01/02/2010   BMI 33.0-33.9,adult 12/05/2008   Essential hypertension 12/05/2008   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   PACEMAKER, 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 Nevada Crane   PACEMAKER INSERTION  03/28/03   St. Jude-PPM   PPM GENERATOR CHANGEOUT N/A 11/24/2018   Procedure: PPM GENERATOR CHANGEOUT;  Surgeon: [name], MD;  Location: [facility];  Service: Cardiovascular;  Laterality: N/A;   Social History   Occupational History   Occupation: runs a Engineer, technical sales    Comment: part time  Tobacco Use   Smoking status: Former Smoker    Packs/day: 1.00    Years: 30.00    Pack years: 30.00    Types: Cigarettes    Start date: 03/30/1955    Quit date: 03/09/1993    Years since quitting: 26.4   Smokeless tobacco: Never Used  Vaping Use   Vaping Use: Never used  Substance and Sexual Activity   Alcohol use: Yes    Alcohol/week: 7.0 standard drinks    Types: 1 Glasses of wine, 2 Shots of liquor, 4 Cans of beer per week   Drug use: No   Sexual activity: Not Currently     Garald Balding, MD   Note - This record has been created using [employer].  Chart creation errors have been sought, but may not always  have been located. Such creation errors do not reflect on  the standard of medical care.

## 2019-09-14 ENCOUNTER — Ambulatory Visit (HOSPITAL_COMMUNITY)
Admission: RE | Admit: 2019-09-14 | Discharge: 2019-09-14 | Disposition: A | Discharge status: Home / Self Care | Payer: Medicare HMO | Source: Ambulatory Visit | Attending: Orthopaedic Surgery | Admitting: Orthopaedic Surgery

## 2019-09-14 ENCOUNTER — Other Ambulatory Visit: Payer: Self-pay

## 2019-09-14 DIAGNOSIS — R2232 Localized swelling, mass and lump, left upper limb: Secondary | ICD-10-CM | POA: Diagnosis present

## 2019-09-14 MED ORDER — TECHNETIUM TC 99M MEDRONATE IV KIT
20.0000 | PACK | Freq: Once | INTRAVENOUS | Status: AC | PRN
  Administered 2019-09-14: 20 via INTRAVENOUS

## 2019-09-21 ENCOUNTER — Encounter: Payer: Self-pay | Admitting: Orthopaedic Surgery

## 2019-09-21 ENCOUNTER — Other Ambulatory Visit: Payer: Self-pay

## 2019-09-21 ENCOUNTER — Ambulatory Visit: Payer: Medicare HMO | Admitting: Orthopaedic Surgery

## 2019-09-21 VITALS — Ht 64.0 in | Wt 193.0 lb

## 2019-09-21 DIAGNOSIS — R2232 Localized swelling, mass and lump, left upper limb: Secondary | ICD-10-CM

## 2019-09-21 NOTE — Progress Notes (Signed)
Office Visit Note   Patient: Peter Howe           Date of Birth: 11-27-1940           MRN: [id number] Visit Date: 09/21/2019              Requested by: Terald Sleeper, PA-C [address] [place],  [place] [address] PCP: Terald Sleeper, PA-C   Assessment & Plan: Visit Diagnoses:  1. Forearm mass, left     Plan: Peter Howe had a three-phase bone scan that did not demonstrate any unusual activity in his left forearm.  He does have some increased uptake in both wrist and right elbow consistent with arthritis.  There is no evidence of a pathologic lesion in his left forearm.  He was happy to know the results and I will plan to see her back as needed.  Follow-Up Instructions: Return if symptoms worsen or fail to improve.   Orders:  No orders of the defined types were placed in this encounter.  No orders of the defined types were placed in this encounter.     Procedures: No procedures performed   Clinical Data: No additional findings.   Subjective: Chief Complaint  Patient presents with  . Left Arm - Mass    Forearm mass  Patient presents today for follow up on his left forearm. He had a bone scan and is here today for those results. No changes in his arm.   HPI  Review of Systems   Objective: Vital Signs: Ht 5\' 4"  (1.626 m)   Wt 193 lb (87.5 kg)   BMI 33.13 kg/m   Physical Exam Constitutional:      Appearance: He is well-developed.  Eyes:     Pupils: Pupils are equal, round, and reactive to light.  Pulmonary:     Effort: Pulmonary effort is normal.  Skin:    General: Skin is warm and dry.  Neurological:     Mental Status: He is alert and oriented to person, place, and time.  Psychiatric:        Behavior: Behavior normal.     Ortho Exam wake alert and oriented x3.  Comfortable sitting.  No pain to palpation at the left forearm and specifically no pain in the area of the bony protuberance about the ulna.  Does have some arthritic changes of both wrists  consistent with the bone scan but certainly is functional.  Good grip and release  Specialty Comments:  No specialty comments available.  Imaging: No results found.   PMFS History: Patient Active Problem List   Diagnosis Date Noted  . Forearm mass, left 06/08/2019  . 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 Controlled substance agreement signed 05/19/2019  . Elevated liver enzymes 06/08/2017  . Epigastric pain 06/04/2017  . 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 Abdominal distension   . Hyperglycemia 03/26/2016  . Abnormal EKG   . Non-intractable vomiting with nausea   . Gastroesophageal reflux disease 02/05/2016  . Chest pain 11/05/2015  . Abdominal pain 11/05/2015  . Essential hypertension with goal blood pressure less than 130/80 09/18/2013  . Shortness of breath 08/31/2012  . ATRIAL FIBRILLATION 01/02/2010  . BMI 33.0-33.9,adult 12/05/2008  . Essential hypertension 12/05/2008  . 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  . PACEMAKER, PERMANENT 12/05/2008   Past Medical History:  Diagnosis Date  . Hypertension   . Overweight(278.02)    obesity  . Permanent atrial fibrillation ([place])   . Tachycardia-bradycardia [facility])    s/p PPM  Family History  Problem Relation Age of Onset  . Cancer Mother 47  . CAD Father 16  . Heart disease Daughter   . Stroke Sister   . Cancer Sister   . Cancer Sister   . Diabetes Brother   . Cancer Brother   . Alcohol abuse Brother     Past Surgical History:  Procedure Laterality Date  . ARM SKIN LESION BIOPSY / EXCISION Left    Melanoma -removed by Dr. Nevada Crane  . PACEMAKER INSERTION  03/28/03   St. Jude-PPM  . PPM GENERATOR CHANGEOUT N/A 11/24/2018   Procedure: PPM GENERATOR CHANGEOUT;  Surgeon: [name], MD;  Location: [facility];  Service: Cardiovascular;  Laterality: N/A;   Social History   Occupational History  . Occupation: runs a Engineer, technical sales    Comment: part time  Tobacco Use  . Smoking status: Former Smoker    Packs/day: 1.00     Years: 30.00    Pack years: 30.00    Types: Cigarettes    Start date: 03/30/1955    Quit date: 03/09/1993    Years since quitting: 26.5  . Smokeless tobacco: Never Used  Vaping Use  . Vaping Use: Never used  Substance and Sexual Activity  . Alcohol use: Yes    Alcohol/week: 7.0 standard drinks    Types: 1 Glasses of wine, 2 Shots of liquor, 4 Cans of beer per week  . Drug use: No  . Sexual activity: Not Currently

## 2019-10-18 ENCOUNTER — Encounter: Payer: Self-pay | Admitting: Family Medicine

## 2019-10-18 ENCOUNTER — Other Ambulatory Visit: Payer: Self-pay

## 2019-10-18 ENCOUNTER — Ambulatory Visit (INDEPENDENT_AMBULATORY_CARE_PROVIDER_SITE_OTHER): Payer: Medicare HMO | Admitting: Family Medicine

## 2019-10-18 VITALS — BP 124/68 | HR 63 | Temp 98.1°F | Resp 20 | Ht 64.0 in | Wt 194.0 lb

## 2019-10-18 DIAGNOSIS — I10 Essential (primary) hypertension: Secondary | ICD-10-CM

## 2019-10-18 DIAGNOSIS — Z79899 Other long term (current) drug therapy: Secondary | ICD-10-CM | POA: Diagnosis not present

## 2019-10-18 DIAGNOSIS — Z6833 Body mass index (BMI) 33.0-33.9, adult: Secondary | ICD-10-CM

## 2019-10-18 DIAGNOSIS — M5431 Sciatica, right side: Secondary | ICD-10-CM

## 2019-10-18 DIAGNOSIS — F5101 Primary insomnia: Secondary | ICD-10-CM | POA: Diagnosis not present

## 2019-10-18 DIAGNOSIS — I4811 Longstanding persistent atrial fibrillation: Secondary | ICD-10-CM

## 2019-10-18 DIAGNOSIS — K219 Gastro-esophageal reflux disease without esophagitis: Secondary | ICD-10-CM

## 2019-10-18 MED ORDER — ZOLPIDEM TARTRATE 5 MG PO TABS: 5.0000 mg | ORAL_TABLET | Freq: Every day | ORAL | 5 refills | Status: DC

## 2019-10-18 MED ORDER — PREDNISONE 10 MG (21) PO TBPK: ORAL_TABLET | ORAL | 0 refills | Status: DC

## 2019-10-18 NOTE — Patient Instructions (Signed)
Sciatica  Sciatica is pain, weakness, tingling, or loss of feeling (numbness) along the sciatic nerve. The sciatic nerve starts in the lower back and goes down the back of each leg. Sciatica usually goes away on its own or with treatment. Sometimes, sciatica may come back (recur). What are the causes? This condition happens when the sciatic nerve is pinched or has pressure put on it. This may be the result of:  A disk in between the bones of the spine bulging out too far (herniated disk).  Changes in the spinal disks that occur with aging.  A condition that affects a muscle in the butt.  Extra bone growth near the sciatic nerve.  A break (fracture) of the area between your hip bones (pelvis).  Pregnancy.  Tumor. This is rare. What increases the risk? You are more likely to develop this condition if you:  Play sports that put pressure or stress on the spine.  Have poor strength and ease of movement (flexibility).  Have had a back injury in the past.  Have had back surgery.  Sit for long periods of time.  Do activities that involve bending or lifting over and over again.  Are very overweight (obese). What are the signs or symptoms? Symptoms can vary from mild to very bad. They may include:  Any of these problems in the lower back, leg, hip, or butt: ? Mild tingling, loss of feeling, or dull aches. ? Burning sensations. ? Sharp pains.  Loss of feeling in the back of the calf or the sole of the foot.  Leg weakness.  Very bad back pain that makes it hard to move. These symptoms may get worse when you cough, sneeze, or laugh. They may also get worse when you sit or stand for long periods of time. How is this treated? This condition often gets better without any treatment. However, treatment may include:  Changing or cutting back on physical activity when you have pain.  Doing exercises and stretching.  Putting ice or heat on the affected area.  Medicines that  help: ? To relieve pain and swelling. ? To relax your muscles.  Shots (injections) of medicines that help to relieve pain, irritation, and swelling.  Surgery. Follow these instructions at home: Medicines  Take over-the-counter and prescription medicines only as told by your doctor.  Ask your doctor if the medicine prescribed to you: ? Requires you to avoid driving or using heavy machinery. ? Can cause trouble pooping (constipation). You may need to take these steps to prevent or treat trouble pooping:  Drink enough fluids to keep your pee (urine) pale yellow.  Take over-the-counter or prescription medicines.  Eat foods that are high in fiber. These include beans, whole grains, and fresh fruits and vegetables.  Limit foods that are high in fat and sugar. These include fried or sweet foods. Managing pain      If told, put ice on the affected area. ? Put ice in a plastic bag. ? Place a towel between your skin and the bag. ? Leave the ice on for 20 minutes, 2-3 times a day.  If told, put heat on the affected area. Use the heat source that your doctor tells you to use, such as a moist heat pack or a heating pad. ? Place a towel between your skin and the heat source. ? Leave the heat on for 20-30 minutes. ? Remove the heat if your skin turns bright red. This is very important if you are   unable to feel pain, heat, or cold. You may have a greater risk of getting burned. Activity   Return to your normal activities as told by your doctor. Ask your doctor what activities are safe for you.  Avoid activities that make your symptoms worse.  Take short rests during the day. ? When you rest for a long time, do some physical activity or stretching between periods of rest. ? Avoid sitting for a long time without moving. Get up and move around at least one time each hour.  Exercise and stretch regularly, as told by your doctor.  Do not lift anything that is heavier than 10 lb (4.5 kg)  while you have symptoms of sciatica. ? Avoid lifting heavy things even when you do not have symptoms. ? Avoid lifting heavy things over and over.  When you lift objects, always lift in a way that is safe for your body. To do this, you should: ? Bend your knees. ? Keep the object close to your body. ? Avoid twisting. General instructions  Stay at a healthy weight.  Wear comfortable shoes that support your feet. Avoid wearing high heels.  Avoid sleeping on a mattress that is too soft or too hard. You might have less pain if you sleep on a mattress that is firm enough to support your back.  Keep all follow-up visits as told by your doctor. This is important. Contact a doctor if:  You have pain that: ? Wakes you up when you are sleeping. ? Gets worse when you lie down. ? Is worse than the pain you have had in the past. ? Lasts longer than 4 weeks.  You lose weight without trying. Get help right away if:  You cannot control when you pee (urinate) or poop (have a bowel movement).  You have weakness in any of these areas and it gets worse: ? Lower back. ? The area between your hip bones. ? Butt. ? Legs.  You have redness or swelling of your back.  You have a burning feeling when you pee. Summary  Sciatica is pain, weakness, tingling, or loss of feeling (numbness) along the sciatic nerve.  This condition happens when the sciatic nerve is pinched or has pressure put on it.  Sciatica can cause pain, tingling, or loss of feeling (numbness) in the lower back, legs, hips, and butt.  Treatment often includes rest, exercise, medicines, and putting ice or heat on the affected area. This information is not intended to replace advice given to you by your health care provider. Make sure you discuss any questions you have with your health care provider. Document Revised: 03/14/2018 Document Reviewed: 03/14/2018 Elsevier Patient Education  2020 Elsevier Inc.  

## 2019-10-18 NOTE — Progress Notes (Signed)
Assessment & Plan:  1. Essential hypertension with goal blood pressure less than 130/80 - Well controlled on current regimen.  - CBC with Differential/Platelet - CMP14+EGFR - Lipid panel  2. Longstanding persistent atrial fibrillation ([place]) - Well controlled on current regimen.   3. Primary insomnia - Well controlled on current regimen. Controlled substance agreement signed today. UDS completed in March 2021. PDMP reviewed and appropriate.  - zolpidem (AMBIEN) 5 MG tablet; Take 1 tablet (5 mg total) by mouth at bedtime.  Dispense: 30 tablet; Refill: 5  4. Controlled substance agreement signed - Signed for Ambien.   5. Gastroesophageal reflux disease without esophagitis - Well controlled on current regimen.   6. BMI 33.0-33.9,adult - Diet and exercise encouraged.   7. Sciatica of right side - Education provided on sciatica.  - predniSONE (STERAPRED UNI-PAK 21 TAB) 10 MG (21) TBPK tablet; As directed x 6 days  Dispense: 21 tablet; Refill: 0   Return in about 6 months (around 04/19/2020) for annual physical.  Hendricks Limes, MSN, APRN, FNP-C [facility]  Subjective:    Patient ID: Peter Howe, male    DOB: 17-Sep-1940, 79 y.o.   MRN: [id number]  Patient Care Team: Loman Brooklyn, FNP as PCP - General (Family Medicine) [name], MD as PCP - Electrophysiology (Cardiology)   Chief Complaint:  Chief Complaint  Patient presents with  . Medical Management of Chronic Issues    6 mo   . Hypertension  . Atrial Fibrillation    HPI: Peter Howe is a 79 y.o. male presenting on 10/18/2019 for Medical Management of Chronic Issues (6 mo ), Hypertension, and Atrial Fibrillation  Patient has been taking Ambien 5 mg at bedtime for years and reports it works well for him. Last use was last night.    New complaints: Patient reports a pain in his right buttock that sometimes radiates down his right leg. The pain has been present for over a month  now. He states it bothers him most when riding or golfing.    Social history:  Relevant past medical, surgical, family and social history reviewed and updated as indicated. Interim medical history since our last visit reviewed.  Allergies and medications reviewed and updated.  DATA REVIEWED: CHART IN EPIC  ROS: Negative unless specifically indicated above in HPI.    Current Outpatient Medications:  .  acetaminophen (TYLENOL) 325 MG tablet, Take 650 mg by mouth every 6 (six) hours as needed for mild pain., Disp: , Rfl:  .  atenolol (TENORMIN) 100 MG tablet, Take 1 tablet (100 mg total) by mouth daily., Disp: 90 tablet, Rfl: 3 .  colchicine 0.6 MG tablet, Take 0.6 mg by mouth 2 (two) times daily as needed (gout). , Disp: , Rfl:  .  losartan (COZAAR) 100 MG tablet, Take 0.5 tablets (50 mg total) by mouth daily., Disp: 90 tablet, Rfl: 3 .  Multiple Vitamin (MULTIVITAMIN WITH MINERALS) TABS tablet, Take 1 tablet by mouth daily., Disp: , Rfl:  .  XARELTO 20 MG TABS tablet, Take 1 tablet (20 mg total) by mouth daily with supper. (Patient taking differently: Take 20 mg by mouth daily with supper. ), Disp: 30 tablet, Rfl: 10 .  zolpidem (AMBIEN) 10 MG tablet, Take 0.5 tablets (5 mg total) by mouth at bedtime as needed for sleep., Disp: 30 tablet, Rfl: 5 .  nitroGLYCERIN (NITROSTAT) 0.4 MG SL tablet, Place 1 tablet (0.4 mg total) under the tongue every 5 (five) minutes as needed  for chest pain. (Patient not taking: Reported on 10/18/2019), Disp: 25 tablet, Rfl: 3   Allergies  Allergen Reactions  . Penicillins Rash    Has patient had a PCN reaction causing immediate rash, facial/tongue/throat swelling, SOB or lightheadedness with hypotension: No Has patient had a PCN reaction causing severe rash involving mucus membranes or skin necrosis: No Has patient had a PCN reaction that required hospitalization No Has patient had a PCN reaction occurring within the last 10 years: No If all of the above  answers are "NO", then may proceed with Cephalosporin use.   . Sulfonamide Derivatives Rash   Past Medical History:  Diagnosis Date  . Hypertension   . Melanoma ([place])   . Overweight(278.02)    obesity  . Permanent atrial fibrillation ([place])   . Tachycardia-bradycardia [facility])    s/p PPM    Past Surgical History:  Procedure Laterality Date  . ARM SKIN LESION BIOPSY / EXCISION Left    Melanoma -removed by Dr. Nevada Crane  . PACEMAKER INSERTION  03/28/03   St. Jude-PPM  . PPM GENERATOR CHANGEOUT N/A 11/24/2018   Procedure: PPM GENERATOR CHANGEOUT;  Surgeon: [name], MD;  Location: [facility];  Service: Cardiovascular;  Laterality: N/A;    Social History   Socioeconomic History  . Marital status: Widowed    Spouse name: Not on file  . Number of children: 1  . Years of education: 65  . Highest education level: High school graduate  Occupational History  . Occupation: runs a Engineer, technical sales    Comment: part time  Tobacco Use  . Smoking status: Former Smoker    Packs/day: 1.00    Years: 30.00    Pack years: 30.00    Types: Cigarettes    Start date: 03/30/1955    Quit date: 03/09/1993    Years since quitting: 26.6  . Smokeless tobacco: Never Used  Vaping Use  . Vaping Use: Never used  Substance and Sexual Activity  . Alcohol use: Yes    Alcohol/week: 7.0 standard drinks    Types: 1 Glasses of wine, 2 Shots of liquor, 4 Cans of beer per week  . Drug use: No  . Sexual activity: Not Currently  Other Topics Concern  . Not on file  Social History Narrative   Owns a Engineer, technical sales, enjoys golf   Lives in [place]: [place]   . Difficulty of Paying Living Expenses: Not hard at all  Food Insecurity: No Food Insecurity  . Worried About Charity fundraiser in the Last Year: Never true  . Ran Out of Food in the Last Year: Never true  Transportation Needs: No Transportation Needs  . Lack of Transportation  (Medical): No  . Lack of Transportation (Non-Medical): No  Physical Activity: Sufficiently Active  . Days of Exercise per Week: 7 days  . Minutes of Exercise per Session: 30 min  Stress: No Stress Concern Present  . Feeling of Stress : Not at all  Social Connections: Moderately Integrated  . Frequency of Communication with Friends and Family: More than three times a week  . Frequency of Social Gatherings with Friends and Family: More than three times a week  . Attends Religious Services: More than 4 times per year  . Active Member of Clubs or Organizations: Yes  . Attends Archivist Meetings: More than 4 times per year  . Marital Status: Widowed  Intimate Partner Violence: Not  At Risk  . Fear of Current or Ex-Partner: No  . Emotionally Abused: No  . Physically Abused: No  . Sexually Abused: No        Objective:    BP 124/68   Pulse 63   Temp 98.1 F (36.7 C)   Resp 20   Ht 5' 4" (1.626 m)   Wt 194 lb (88 kg)   SpO2 96%   BMI 33.30 kg/m   Wt Readings from Last 3 Encounters:  10/18/19 194 lb (88 kg)  09/21/19 193 lb (87.5 kg)  08/30/19 195 lb 6.4 oz (88.6 kg)    Physical Exam Vitals reviewed.  Constitutional:      General: He is not in acute distress.    Appearance: Normal appearance. He is obese. He is not ill-appearing, toxic-appearing or diaphoretic.  HENT:     Head: Normocephalic and atraumatic.  Eyes:     General: No scleral icterus.       Right eye: No discharge.        Left eye: No discharge.     Conjunctiva/sclera: Conjunctivae normal.  Cardiovascular:     Rate and Rhythm: Normal rate and regular rhythm.     Heart sounds: Normal heart sounds. No murmur heard.  No friction rub. No gallop.   Pulmonary:     Effort: Pulmonary effort is normal. No respiratory distress.     Breath sounds: Normal breath sounds. No stridor. No wheezing, rhonchi or rales.  Musculoskeletal:        General: Normal range of motion.     Cervical back: Normal range of  motion.  Skin:    General: Skin is warm and dry.  Neurological:     Mental Status: He is alert and oriented to person, place, and time. Mental status is at baseline.  Psychiatric:        Mood and Affect: Mood normal.        Behavior: Behavior normal.        Thought Content: Thought content normal.        Judgment: Judgment normal.     Lab Results  Component Value Date   TSH 0.825 04/19/2019   Lab Results  Component Value Date   WBC 6.1 04/19/2019   HGB 16.7 04/19/2019   HCT 49.7 04/19/2019   MCV 97 04/19/2019   PLT 149 (L) 04/19/2019   Lab Results  Component Value Date   NA 139 04/19/2019   K 4.6 04/19/2019   CO2 22 04/19/2019   GLUCOSE 93 04/19/2019   BUN 17 04/19/2019   CREATININE 1.16 04/19/2019   BILITOT 0.8 04/19/2019   ALKPHOS 81 04/19/2019   AST 40 04/19/2019   ALT 37 04/19/2019   PROT 6.7 04/19/2019   ALBUMIN 4.3 04/19/2019   CALCIUM 9.3 04/19/2019   ANIONGAP 8 11/21/2018   Lab Results  Component Value Date   CHOL 207 (H) 04/19/2019   Lab Results  Component Value Date   HDL 102 04/19/2019   Lab Results  Component Value Date   LDLCALC 94 04/19/2019   Lab Results  Component Value Date   TRIG 62 04/19/2019   Lab Results  Component Value Date   CHOLHDL 2.0 04/19/2019   Lab Results  Component Value Date   HGBA1C 5.5 03/26/2016           

## 2019-10-19 LAB — CMP14+EGFR
ALT: 32 IU/L (ref 0–44)
AST: 31 IU/L (ref 0–40)
Albumin/Globulin Ratio: 2.6 — ABNORMAL HIGH (ref 1.2–2.2)
Albumin: 4.4 g/dL (ref 3.7–4.7)
Alkaline Phosphatase: 71 IU/L (ref 48–121)
BUN/Creatinine Ratio: 14 (ref 10–24)
BUN: 16 mg/dL (ref 8–27)
Bilirubin Total: 0.5 mg/dL (ref 0.0–1.2)
CO2: 26 mmol/L (ref 20–29)
Calcium: 9.6 mg/dL (ref 8.6–10.2)
Chloride: 102 mmol/L (ref 96–106)
Creatinine, Ser: 1.15 mg/dL (ref 0.76–1.27)
GFR calc Af Amer: 70 mL/min/{1.73_m2} (ref >59)
GFR calc non Af Amer: 60 mL/min/{1.73_m2} (ref >59)
Globulin, Total: 1.7 g/dL (ref 1.5–4.5)
Glucose: 95 mg/dL (ref 65–99)
Potassium: 4.9 mmol/L (ref 3.5–5.2)
Sodium: 142 mmol/L (ref 134–144)
Total Protein: 6.1 g/dL (ref 6.0–8.5)

## 2019-10-19 LAB — CBC WITH DIFFERENTIAL/PLATELET
Basophils Absolute: 0.1 10*3/uL (ref 0.0–0.2)
Basos: 1 %
EOS (ABSOLUTE): 0.2 10*3/uL (ref 0.0–0.4)
Eos: 3 %
Hematocrit: 46.3 % (ref 37.5–51.0)
Hemoglobin: 15.5 g/dL (ref 13.0–17.7)
Immature Grans (Abs): 0 10*3/uL (ref 0.0–0.1)
Immature Granulocytes: 0 %
Lymphocytes Absolute: 1.1 10*3/uL (ref 0.7–3.1)
Lymphs: 18 %
MCH: 32.4 pg (ref 26.6–33.0)
MCHC: 33.5 g/dL (ref 31.5–35.7)
MCV: 97 fL (ref 79–97)
Monocytes Absolute: 0.5 10*3/uL (ref 0.1–0.9)
Monocytes: 9 %
Neutrophils Absolute: 4.2 10*3/uL (ref 1.4–7.0)
Neutrophils: 69 %
Platelets: 134 10*3/uL — ABNORMAL LOW (ref 150–450)
RBC: 4.79 x10E6/uL (ref 4.14–5.80)
RDW: 12.7 % (ref 11.6–15.4)
WBC: 6.1 10*3/uL (ref 3.4–10.8)

## 2019-10-19 LAB — LIPID PANEL
Chol/HDL Ratio: 2.1 ratio (ref 0.0–5.0)
Cholesterol, Total: 190 mg/dL (ref 100–199)
HDL: 90 mg/dL (ref >39)
LDL Chol Calc (NIH): 90 mg/dL (ref 0–99)
Triglycerides: 52 mg/dL (ref 0–149)
VLDL Cholesterol Cal: 10 mg/dL (ref 5–40)

## 2019-10-20 ENCOUNTER — Ambulatory Visit: Payer: Medicare HMO | Admitting: Family Medicine

## 2019-10-20 ENCOUNTER — Ambulatory Visit: Payer: Medicare HMO | Admitting: Physician Assistant

## 2019-11-07 ENCOUNTER — Ambulatory Visit (INDEPENDENT_AMBULATORY_CARE_PROVIDER_SITE_OTHER): Payer: Medicare HMO | Admitting: *Deleted

## 2019-11-07 DIAGNOSIS — I495 Sick sinus syndrome: Secondary | ICD-10-CM

## 2019-11-07 LAB — CUP PACEART REMOTE DEVICE CHECK
Battery Remaining Longevity: 131 mo
Battery Remaining Percentage: 95.5 %
Battery Voltage: 3.02 V
Brady Statistic RV Percent Paced: 37 %
Date Time Interrogation Session: 20210831063920
Implantable Lead Implant Date: 19980120
Implantable Lead Implant Date: 20050119
Implantable Lead Location: 753859
Implantable Lead Location: 753860
Implantable Pulse Generator Implant Date: 20200917
Lead Channel Impedance Value: 710 Ohm
Lead Channel Pacing Threshold Amplitude: 0.5 V
Lead Channel Pacing Threshold Pulse Width: 0.4 ms
Lead Channel Sensing Intrinsic Amplitude: 12 mV
Lead Channel Setting Pacing Amplitude: 2.5 V
Lead Channel Setting Pacing Pulse Width: 0.4 ms
Lead Channel Setting Sensing Sensitivity: 2 mV
Pulse Gen Model: 2272
Pulse Gen Serial Number: 9154140

## 2019-11-08 NOTE — Progress Notes (Signed)
Remote pacemaker transmission.   

## 2020-01-24 ENCOUNTER — Other Ambulatory Visit: Payer: Self-pay

## 2020-01-24 NOTE — Telephone Encounter (Signed)
Pt was on 10 mg 1/2 QD, at 10/18/19 visit Britney changed to 5 mg 1 QD Pt will check with pharmacy and ask for 5 mg prescription

## 2020-01-24 NOTE — Telephone Encounter (Signed)
  Prescription Request  01/24/2020  What is the name of the medication or equipment? ambien  Have you contacted your pharmacy to request a refill? (if applicable) yes and he said they have faxed twice  Which pharmacy would you like this sent to? [facility]   Patient notified that their request is being sent to the clinical staff for review and that they should receive a response within 2 business days.

## 2020-02-05 ENCOUNTER — Other Ambulatory Visit: Payer: Self-pay | Admitting: Internal Medicine

## 2020-02-06 ENCOUNTER — Ambulatory Visit (INDEPENDENT_AMBULATORY_CARE_PROVIDER_SITE_OTHER): Payer: Medicare HMO

## 2020-02-06 DIAGNOSIS — I495 Sick sinus syndrome: Secondary | ICD-10-CM | POA: Diagnosis not present

## 2020-02-06 NOTE — Telephone Encounter (Signed)
Pt last saw Dr Rayann Heman 03/06/19 video visit Covid-19, last labs 10/18/19 Creat 1.15, age 79, weight 88kg, CrCl 64.83, based on CrCl pt is on appropriate dosage of Xarelto 20mg  QD.  Will refill rx.

## 2020-02-08 LAB — CUP PACEART REMOTE DEVICE CHECK
Battery Remaining Longevity: 130 mo
Battery Remaining Percentage: 95.5 %
Battery Voltage: 3.02 V
Brady Statistic RV Percent Paced: 38 %
Date Time Interrogation Session: 20211201145759
Implantable Lead Implant Date: 19980120
Implantable Lead Implant Date: 20050119
Implantable Lead Location: 753859
Implantable Lead Location: 753860
Implantable Pulse Generator Implant Date: 20200917
Lead Channel Impedance Value: 750 Ohm
Lead Channel Pacing Threshold Amplitude: 0.5 V
Lead Channel Pacing Threshold Pulse Width: 0.4 ms
Lead Channel Sensing Intrinsic Amplitude: 12 mV
Lead Channel Setting Pacing Amplitude: 2.5 V
Lead Channel Setting Pacing Pulse Width: 0.4 ms
Lead Channel Setting Sensing Sensitivity: 2 mV
Pulse Gen Model: 2272
Pulse Gen Serial Number: 9154140

## 2020-02-12 NOTE — Progress Notes (Signed)
Remote pacemaker transmission.   

## 2020-02-19 ENCOUNTER — Other Ambulatory Visit: Payer: Medicare HMO

## 2020-02-19 DIAGNOSIS — Z20822 Contact with and (suspected) exposure to covid-19: Secondary | ICD-10-CM

## 2020-02-20 LAB — NOVEL CORONAVIRUS, NAA: SARS-CoV-2, NAA: NOT DETECTED

## 2020-02-20 LAB — SARS-COV-2, NAA 2 DAY TAT

## 2020-02-21 ENCOUNTER — Other Ambulatory Visit: Payer: Self-pay

## 2020-02-21 ENCOUNTER — Ambulatory Visit (INDEPENDENT_AMBULATORY_CARE_PROVIDER_SITE_OTHER): Payer: Medicare HMO

## 2020-02-21 DIAGNOSIS — Z23 Encounter for immunization: Secondary | ICD-10-CM

## 2020-02-26 ENCOUNTER — Ambulatory Visit (INDEPENDENT_AMBULATORY_CARE_PROVIDER_SITE_OTHER): Payer: Medicare HMO

## 2020-02-26 ENCOUNTER — Other Ambulatory Visit: Payer: Self-pay

## 2020-02-26 DIAGNOSIS — Z23 Encounter for immunization: Secondary | ICD-10-CM | POA: Diagnosis not present

## 2020-03-03 NOTE — Progress Notes (Signed)
Cardiology Office Note Date:  03/05/2020  Patient ID:  [name], [name] 03/22/1940, MRN [id number] PCP:  Gwenlyn Fudge, FNP  Electrophysiologist: Dr. Johney Frame    Chief Complaint:  annual visit  History of Present Illness: Peter Howe is a 78 y.o. male with history of HTN, permanent AFib, tachy-brady w/PPM.  He comes in today to be seen for Dr. Johney Frame, last seen by him via tele health Dec 2020.  He was doing well, only occassional palpitations, golfing.  No changes were made.  TODAY He is dong very well.  Walks daily 30-45 minutes and golfs regularly.  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 No CP, palpitations or cardiac awareness. Mentions some indigestion in the morning sometimes, though once moving around belches a few times and passes. No SOB or DOE Infrequent and fleeting orthostatic lightheadedness NO near syncope or syncope No bleeding or signs of bleeding Sees his PMD 2x a year   Device information SJM dual chamber PPM last gen change 2020 RV lead 2005 RA lead 1998  Past Medical History:  Diagnosis Date  . Hypertension   . Melanoma (HCC)   . Overweight(278.02)    obesity  . Permanent atrial fibrillation (HCC)   . 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 ARM SKIN LESION BIOPSY / EXCISION Left    Melanoma -removed by Dr. Margo Aye  . PACEMAKER INSERTION  03/28/03   St. Jude-PPM  . PPM GENERATOR CHANGEOUT N/A 11/24/2018   Procedure: PPM GENERATOR CHANGEOUT;  Surgeon: Hillis Range, MD;  Location: [facility];  Service: Cardiovascular;  Laterality: N/A;    Current Outpatient Medications  Medication Sig Dispense Refill  . acetaminophen (TYLENOL) 325 MG tablet Take 650 mg by mouth every 6 (six) hours as needed for mild pain.    Marland Kitchen atenolol (TENORMIN) 100 MG tablet Take 1 tablet (100 mg total) by mouth daily. 90 tablet 3  . colchicine 0.6 MG tablet Take 0.6 mg by mouth 2 (two)  times daily as needed (gout).     Marland Kitchen losartan (COZAAR) 100 MG tablet Take 0.5 tablets (50 mg total) by mouth daily. 90 tablet 3  . Multiple Vitamin (MULTIVITAMIN WITH MINERALS) TABS tablet Take 1 tablet by mouth daily.    . nitroGLYCERIN (NITROSTAT) 0.4 MG SL tablet Place 1 tablet (0.4 mg total) under the tongue every 5 (five) minutes as needed for chest pain. 25 tablet 3  . XARELTO 20 MG TABS tablet Take 1 tablet (20 mg total) by mouth daily with supper. 30 tablet 0  . zolpidem (AMBIEN) 5 MG tablet Take 1 tablet (5 mg total) by mouth at bedtime. 30 tablet 5   No current facility-administered medications for this visit.    Allergies:   Penicillins and Sulfonamide derivatives   Social History:  The patient  reports that he quit smoking about 27 years ago. His smoking use included cigarettes. He started smoking about 64 years ago. He has a 30.00 pack-year smoking history. He has never used smokeless tobacco. He reports current alcohol use of about 7.0 standard drinks of alcohol per week. He reports that he does not use drugs.   Family History:  The patient's family history includes Alcohol abuse in his brother; CAD (age of onset: 19) in his father; Cancer in his brother, sister, and sister; Cancer (age of onset: 53) in his mother; Diabetes in his brother; Heart disease in his  daughter; Stroke in his sister.  ROS:  Please see the history of present illness.    All other systems are reviewed and otherwise negative.   PHYSICAL EXAM:  VS:  BP 122/70   Pulse 69   Ht 5\' 4"  (1.626 m)   Wt 188 lb (85.3 kg)   SpO2 98%   BMI 32.27 kg/m  BMI: Body mass index is 32.27 kg/m. Well nourished, well developed, in no acute distress HEENT: normocephalic, atraumatic Neck: no JVD, carotid bruits or masses Cardiac:  irreg-irreg; no significant murmurs, no rubs, or gallops Lungs:  CTA b/l, no wheezing, rhonchi or rales Abd: soft, nontender MS: no deformity oratrophy Ext:  no edema Skin: warm and dry, no  rash Neuro:  No gross deficits appreciated Psych: euthymic mood, full affect  PPM site (R side) is stable, no tethering or discomfort   EKG:  Done today and reviewed by myself shows  AFib, one paced beat, 69bpm  Device interrogation done today and reviewed by myself:  Battery and lead measurements are good No HVR episodes   03/26/2016; TTE Study Conclusions  - Left ventricle: The cavity size was normal. Wall thickness was  increased in a pattern of moderate LVH. Systolic function was  normal. The estimated ejection fraction was in the range of 60%  to 65%. Wall motion was normal; there were no regional wall  motion abnormalities.  - Aortic valve: Mildly calcified annulus. Trileaflet; mildly  thickened leaflets. Valve area (VTI): 2.58 cm^2. Valve area  (Vmax): 2.48 cm^2.  - Left atrium: The atrium was mildly dilated.  - Atrial septum: No defect or patent foramen ovale was identified.  - Pulmonary arteries: Systolic pressure was mildly increased. PA  peak pressure: 31 mm Hg (S).  - Technically difficult study.   Recent Labs: 04/19/2019: TSH 0.825 10/18/2019: ALT 32; BUN 16; Creatinine, Ser 1.15; Hemoglobin 15.5; Platelets 134; Potassium 4.9; Sodium 142  10/18/2019: Chol/HDL Ratio 2.1; Cholesterol, Total 190; HDL 90; LDL Chol Calc (NIH) 90; Triglycerides 52   CrCl cannot be calculated (Patient's most recent lab result is older than the maximum 21 days allowed.).   Wt Readings from Last 3 Encounters:  03/05/20 188 lb (85.3 kg)  10/18/19 194 lb (88 kg)  09/21/19 193 lb (87.5 kg)     Other studies reviewed: Additional studies/records reviewed today include: summarized above  ASSESSMENT AND PLAN:  1. PPM     No programming changes made  2. Permanent AFib     CHA2DS2Vasc is 3, on  Xarelto, appropriately dosed      3. HTN     Looks good   Disposition: F/u with remotes Q 82mo, and in clinic in a year again, sooner if needed  Current medicines are reviewed  at length with the patient today.  The patient did not have any concerns regarding medicines.  Venetia Night, PA-C 03/05/2020 8:47 AM     [address]  [place] [address] [phone] (office)  [phone] (fax)

## 2020-03-05 ENCOUNTER — Encounter: Payer: Self-pay | Admitting: Physician Assistant

## 2020-03-05 ENCOUNTER — Ambulatory Visit: Payer: Medicare HMO | Admitting: Physician Assistant

## 2020-03-05 ENCOUNTER — Other Ambulatory Visit: Payer: Self-pay

## 2020-03-05 VITALS — BP 122/70 | HR 69 | Ht 64.0 in | Wt 188.0 lb

## 2020-03-05 DIAGNOSIS — I4821 Permanent atrial fibrillation: Secondary | ICD-10-CM | POA: Diagnosis not present

## 2020-03-05 DIAGNOSIS — I10 Essential (primary) hypertension: Secondary | ICD-10-CM | POA: Diagnosis not present

## 2020-03-05 DIAGNOSIS — I4891 Unspecified atrial fibrillation: Secondary | ICD-10-CM | POA: Diagnosis not present

## 2020-03-05 DIAGNOSIS — Z950 Presence of cardiac pacemaker: Secondary | ICD-10-CM

## 2020-03-05 DIAGNOSIS — I495 Sick sinus syndrome: Secondary | ICD-10-CM | POA: Diagnosis not present

## 2020-03-05 NOTE — Patient Instructions (Signed)
Medication Instructions:   Your physician recommends that you continue on your current medications as directed. Please refer to the Current Medication list given to you today.  *If you need a refill on your cardiac medications before your next appointment, please call your pharmacy*   Lab Work: NONE ORDERED  TODAY   If you have labs (blood work) drawn today and your tests are completely normal, you will receive your results only by: . MyChart Message (if you have MyChart) OR . A paper copy in the mail If you have any lab test that is abnormal or we need to change your treatment, we will call you to review the results.   Testing/Procedures: NONE ORDERED  TODAY   Follow-Up: At [facility], you and your health needs are our priority.  As part of our continuing mission to provide you with exceptional heart care, we have created designated Provider Care Teams.  These Care Teams include your primary Cardiologist (physician) and Advanced Practice Providers (APPs -  Physician Assistants and Nurse Practitioners) who all work together to provide you with the care you need, when you need it.  We recommend signing up for the patient portal called "MyChart".  Sign up information is provided on this After Visit Summary.  MyChart is used to connect with patients for Virtual Visits (Telemedicine).  Patients are able to view lab/test results, encounter notes, upcoming appointments, etc.  Non-urgent messages can be sent to your provider as well.   To learn more about what you can do with MyChart, go to [email or web address].    Your next appointment:   1 year(s)  The format for your next appointment:   In Person  Provider:   You may see James Allred, MD or one of the following Advanced Practice Providers on your designated Care Team:    Amber Seiler, NP  Renee Ursuy, PA-C  Michael "Andy" Tillery, PA-C    Other Instructions   

## 2020-03-07 ENCOUNTER — Other Ambulatory Visit: Payer: Self-pay | Admitting: Internal Medicine

## 2020-03-09 HISTORY — PX: CARPAL TUNNEL RELEASE: SHX101

## 2020-03-11 NOTE — Telephone Encounter (Signed)
Prescription refill request for Xarelto received.  Indication:afib Last office visit:03/05/20 Weight:85kg Age:80 Scr:1.15 CrCl:62 ml/min

## 2020-04-05 ENCOUNTER — Telehealth: Payer: Self-pay | Admitting: *Deleted

## 2020-04-05 DIAGNOSIS — F5101 Primary insomnia: Secondary | ICD-10-CM

## 2020-04-05 NOTE — Telephone Encounter (Signed)
Key: [id number]  Status Sent to Plan today Drug Zolpidem Tartrate 5MG  tablets  (appears may have to try DOXEPIN 3 mg or 6 mg before zolpidem will be approved)   Will wait for determination.

## 2020-04-08 NOTE — Telephone Encounter (Signed)
Med DENIED  alternative drug doxepin  (3 mg or 6 mg) must be tried and failed before coverage

## 2020-04-08 NOTE — Telephone Encounter (Signed)
Lmtcb.

## 2020-04-08 NOTE — Telephone Encounter (Signed)
Please make patient aware and ask if he is agreeable.

## 2020-04-12 NOTE — Telephone Encounter (Signed)
Patient aware and is agreeable to try Doxepin.

## 2020-04-15 ENCOUNTER — Other Ambulatory Visit: Payer: Self-pay | Admitting: Internal Medicine

## 2020-04-15 DIAGNOSIS — I10 Essential (primary) hypertension: Secondary | ICD-10-CM

## 2020-04-15 MED ORDER — DOXEPIN HCL 3 MG PO TABS: 3.0000 mg | ORAL_TABLET | Freq: Every day | ORAL | 2 refills | Status: DC

## 2020-04-15 NOTE — Telephone Encounter (Signed)
Patient aware and verbalizes understanding. 

## 2020-04-15 NOTE — Telephone Encounter (Signed)
Ambien discontinued.  Rx'd doxepin for patient to try since insurance will not cover Ambien unless he tries this.

## 2020-04-19 ENCOUNTER — Ambulatory Visit: Payer: Medicare HMO | Admitting: Family Medicine

## 2020-04-24 ENCOUNTER — Ambulatory Visit (INDEPENDENT_AMBULATORY_CARE_PROVIDER_SITE_OTHER): Payer: Medicare HMO | Admitting: Family Medicine

## 2020-04-24 ENCOUNTER — Encounter: Payer: Self-pay | Admitting: Family Medicine

## 2020-04-24 ENCOUNTER — Other Ambulatory Visit: Payer: Self-pay

## 2020-04-24 VITALS — BP 143/74 | HR 52 | Temp 97.1°F | Ht 64.0 in | Wt 192.2 lb

## 2020-04-24 DIAGNOSIS — R200 Anesthesia of skin: Secondary | ICD-10-CM | POA: Diagnosis not present

## 2020-04-24 DIAGNOSIS — Z23 Encounter for immunization: Secondary | ICD-10-CM

## 2020-04-24 DIAGNOSIS — I10 Essential (primary) hypertension: Secondary | ICD-10-CM

## 2020-04-24 DIAGNOSIS — M1990 Unspecified osteoarthritis, unspecified site: Secondary | ICD-10-CM | POA: Diagnosis not present

## 2020-04-24 DIAGNOSIS — F5101 Primary insomnia: Secondary | ICD-10-CM | POA: Diagnosis not present

## 2020-04-24 DIAGNOSIS — R202 Paresthesia of skin: Secondary | ICD-10-CM

## 2020-04-24 DIAGNOSIS — R69 Illness, unspecified: Secondary | ICD-10-CM | POA: Diagnosis not present

## 2020-04-24 DIAGNOSIS — Z79899 Other long term (current) drug therapy: Secondary | ICD-10-CM | POA: Diagnosis not present

## 2020-04-24 DIAGNOSIS — I4811 Longstanding persistent atrial fibrillation: Secondary | ICD-10-CM

## 2020-04-24 MED ORDER — ZOLPIDEM TARTRATE 5 MG PO TABS
5.0000 mg | ORAL_TABLET | Freq: Every day | ORAL | 5 refills | Status: DC
Start: 2020-04-24 — End: 2020-10-23

## 2020-04-24 NOTE — Patient Instructions (Signed)
Tylenol and Voltaren gel for arthritis pain.   Arthritis Arthritis means joint pain. It can also mean joint disease. A joint is a place where bones come together. There are more than 100 types of arthritis. What are the causes? This condition may be caused by:  Wear and tear of a joint. This is the most common cause.  A lot of acid in the blood, which leads to pain in the joint (gout).  Pain and swelling (inflammation) in a joint.  Infection of a joint.  Injuries in the joint.  A reaction to medicines (allergy). In some cases, the cause may not be known. What are the signs or symptoms? Symptoms of this condition include:  Redness at a joint.  Swelling at a joint.  Stiffness at a joint.  Warmth coming from the joint.  A fever.  A feeling of being sick. How is this treated? This condition may be treated with:  Treating the cause, if it is known.  Rest.  Raising (elevating) the joint.  Putting cold or hot packs on the joint.  Medicines to treat symptoms and reduce pain and swelling.  Shots of medicines (cortisone) into the joint. You may also be told to make changes in your life, such as doing exercises and losing weight. Follow these instructions at home: Medicines  Take over-the-counter and prescription medicines only as told by your doctor.  Do not take aspirin for pain if your doctor says that you may have gout. Activity  Rest your joint if your doctor tells you to.  Avoid activities that make the pain worse.  Exercise your joint regularly as told by your doctor. Try doing exercises like: ? Swimming. ? Water aerobics. ? Biking. ? Walking. Managing pain, stiffness, and swelling  If told, put ice on the affected area. ? Put ice in a plastic bag. ? Place a towel between your skin and the bag. ? Leave the ice on for 20 minutes, 2-3 times per day.  If your joint is swollen, raise (elevate) it above the level of your heart if told by your  doctor.  If your joint feels stiff in the morning, try taking a warm shower.  If told, put heat on the affected area. Do this as often as told by your doctor. Use the heat source that your doctor recommends, such as a moist heat pack or a heating pad. If you have diabetes, do not apply heat without asking your doctor. To apply heat: ? Place a towel between your skin and the heat source. ? Leave the heat on for 20-30 minutes. ? Remove the heat if your skin turns bright red. This is very important if you are unable to feel pain, heat, or cold. You may have a greater risk of getting burned.      General instructions  Do not use any products that contain nicotine or tobacco, such as cigarettes, e-cigarettes, and chewing tobacco. If you need help quitting, ask your doctor.  Keep all follow-up visits as told by your doctor. This is important. Contact a doctor if:  The pain gets worse.  You have a fever. Get help right away if:  You have very bad pain in your joint.  You have swelling in your joint.  Your joint is red.  Many joints become painful and swollen.  You have very bad back pain.  Your leg is very weak.  You cannot control your pee (urine) or poop (stool). Summary  Arthritis means joint pain. It  can also mean joint disease. A joint is a place where bones come together.  The most common cause of this condition is wear and tear of a joint.  Symptoms of this condition include redness, swelling, or stiffness of the joint.  This condition is treated with rest, raising the joint, medicines, and putting cold or hot packs on the joint.  Follow your doctor's instructions about medicines, activity, exercises, and other home care treatments. This information is not intended to replace advice given to you by your health care provider. Make sure you discuss any questions you have with your health care provider. Document Revised: 01/31/2018 Document Reviewed: 01/31/2018 Elsevier  Patient Education  2021 [employer].

## 2020-04-24 NOTE — Progress Notes (Signed)
Assessment & Plan:  1. Primary insomnia Patient has failed therapy with doxepin.  We are putting him back on Ambien.  He can pay for this out of pocket if his insurance does not want to pay. - zolpidem (AMBIEN) 5 MG tablet; Take 1 tablet (5 mg total) by mouth at bedtime.  Dispense: 30 tablet; Refill: 5  2. Controlled substance agreement signed Controlled substance agreement in place for Ambien.  Urine drug screen completed in March of last year.  PDMP reviewed with no concerning findings. - zolpidem (AMBIEN) 5 MG tablet; Take 1 tablet (5 mg total) by mouth at bedtime.  Dispense: 30 tablet; Refill: 5  3. Essential hypertension Well controlled on current regimen.  - CBC with Differential/Platelet - CMP14+EGFR - Lipid panel  4. Longstanding persistent atrial fibrillation (HCC) Well controlled on current regimen.  - CBC with Differential/Platelet - CMP14+EGFR  5. Arthritis Tylenol and Voltaren gel for arthritis pains.  Education provided on arthritis. - CMP14+EGFR  6. Numbness and tingling of left hand - Ambulatory referral to Orthopedic Surgery  7. Immunization due - Pneumococcal polysaccharide vaccine 23-valent greater than or equal to 2yo subcutaneous/IM - given in office today.   Return in about 6 months (around 10/22/2020) for annual physical.  Peter Limes, MSN, APRN, FNP-C [facility]  Subjective:    Patient ID: Peter Howe, male    DOB: November 05, 1940, 80 y.o.   MRN: [id number]  Patient Care Team: Peter Brooklyn, FNP as PCP - General (Family Medicine) Peter Grayer, MD as PCP - Electrophysiology (Cardiology)   Chief Complaint:  Chief Complaint  Patient presents with  . Hypertension    Check up of chronic medical conditions   . Numbness    Patient states that he has been having left handed numbness x 3 months.  . Arthritis    HPI: Peter Howe is a 80 y.o. male presenting on 04/24/2020 for Hypertension (Check up of chronic  medical conditions/), Numbness (Patient states that he has been having left handed numbness x 3 months.), and Arthritis  Hypertension and atrial fibrillation managed by cardiology who he saw in December.  Insomnia: Patient was previously sleeping well with Ambien 5 mg at bedtime.  This year his insurance does not have Ambien on the preferred medication list.  We did do a PA which revealed patient needed to try doxepin, which she was agreeable to.  He has been taking it and reports he is not sleeping as well.  He would like to switch back to Ambien.  States if they will not cover it he will just pay out-of-pocket.  New complaints: Patient reports he has been having some arthritis pains in his joints of his fingers.  He does not take any medications to treat.  Patient also reports left hand numbness and tingling intermittently that causes him to drop things.  This has been going on for a few months.  He does not feel it is worse any particular time of day.  Denies any neck pain.  Social history:  Relevant past medical, surgical, family and social history reviewed and updated as indicated. Interim medical history since our last visit reviewed.  Allergies and medications reviewed and updated.  DATA REVIEWED: CHART IN EPIC  ROS: Negative unless specifically indicated above in HPI.    Current Outpatient Medications:  .  acetaminophen (TYLENOL) 325 MG tablet, Take 650 mg by mouth every 6 (six) hours as needed for mild pain., Disp: , Rfl:  .  atenolol (TENORMIN) 100 MG tablet, Take 1 tablet (100 mg total) by mouth daily., Disp: 90 tablet, Rfl: 3 .  colchicine 0.6 MG tablet, Take 0.6 mg by mouth 2 (two) times daily as needed (gout). , Disp: , Rfl:  .  Doxepin HCl 3 MG TABS, Take 1 tablet (3 mg total) by mouth at bedtime., Disp: 30 tablet, Rfl: 2 .  losartan (COZAAR) 100 MG tablet, Take 0.5 tablets (50 mg total) by mouth daily., Disp: 90 tablet, Rfl: 3 .  Multiple Vitamin (MULTIVITAMIN WITH  MINERALS) TABS tablet, Take 1 tablet by mouth daily., Disp: , Rfl:  .  nitroGLYCERIN (NITROSTAT) 0.4 MG SL tablet, Place 1 tablet (0.4 mg total) under the tongue every 5 (five) minutes as needed for chest pain., Disp: 25 tablet, Rfl: 3 .  XARELTO 20 MG TABS tablet, Take 1 tablet (20 mg total) by mouth daily with supper., Disp: 30 tablet, Rfl: 5   Allergies  Allergen Reactions  . Penicillins Rash    Has patient had a PCN reaction causing immediate rash, facial/tongue/throat swelling, SOB or lightheadedness with hypotension: No Has patient had a PCN reaction causing severe rash involving mucus membranes or skin necrosis: No Has patient had a PCN reaction that required hospitalization No Has patient had a PCN reaction occurring within the last 10 years: No If all of the above answers are "NO", then may proceed with Cephalosporin use.   . Sulfonamide Derivatives Rash   Past Medical History:  Diagnosis Date  . Hypertension   . Melanoma ([place])   . Overweight(278.02)    obesity  . Permanent atrial fibrillation ([place])   . 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 ARM SKIN LESION BIOPSY / EXCISION Left    Melanoma -removed by Peter Howe  . PACEMAKER INSERTION  03/28/03   St. Jude-PPM  . PPM GENERATOR CHANGEOUT N/A 11/24/2018   Procedure: PPM GENERATOR CHANGEOUT;  Surgeon: Peter Grayer, MD;  Location: [facility];  Service: Cardiovascular;  Laterality: N/A;    Social History   Socioeconomic History  . Marital status: Widowed    Spouse name: Not on file  . Number of children: 1  . Years of education: 11  . Highest education level: High school graduate  Occupational History  . Occupation: runs a Engineer, technical sales    Comment: part time  Tobacco Use  . Smoking status: Former Smoker    Packs/day: 1.00    Years: 30.00    Pack years: 30.00    Types: Cigarettes    Start date: 03/30/1955    Quit date: 03/09/1993    Years since  quitting: 27.1  . Smokeless tobacco: Never Used  Vaping Use  . Vaping Use: Never used  Substance and Sexual Activity  . Alcohol use: Yes    Alcohol/week: 7.0 standard drinks    Types: 1 Glasses of wine, 2 Shots of liquor, 4 Cans of beer per week  . Drug use: No  . Sexual activity: Not Currently  Other Topics Concern  . Not on file  Social History Narrative   Owns a Engineer, technical sales, enjoys golf   Lives in [place] Strain: [place]   . Difficulty of Paying Living Expenses: Not hard at all  Food Insecurity: No Food Insecurity  . Worried About Charity fundraiser in the Last Year: Never true  . Ran Out of  Food in the Last Year: Never true  Transportation Needs: No Transportation Needs  . Lack of Transportation (Medical): No  . Lack of Transportation (Non-Medical): No  Physical Activity: Sufficiently Active  . Days of Exercise per Week: 7 days  . Minutes of Exercise per Session: 30 min  Stress: No Stress Concern Present  . Feeling of Stress : Not at all  Social Connections: Moderately Integrated  . Frequency of Communication with Friends and Family: More than three times a week  . Frequency of Social Gatherings with Friends and Family: More than three times a week  . Attends Religious Services: More than 4 times per year  . Active Member of Clubs or Organizations: Yes  . Attends Archivist Meetings: More than 4 times per year  . Marital Status: Widowed  Intimate Partner Violence: Not At Risk  . Fear of Current or Ex-Partner: No  . Emotionally Abused: No  . Physically Abused: No  . Sexually Abused: No        Objective:    BP (!) 143/74   Pulse (!) 52   Temp (!) 97.1 F (36.2 C) (Temporal)   Ht _0  (1.626 m)   Wt 192 lb 3.2 oz (87.2 kg)   SpO2 98%   BMI 32.99 kg/m   Wt Readings from Last 3 Encounters:  04/24/20 192 lb 3.2 oz (87.2 kg)  03/05/20 188 lb (85.3 kg)  10/18/19 194 lb (88 kg)    Physical  Exam Vitals reviewed.  Constitutional:      General: He is not in acute distress.    Appearance: Normal appearance. He is not ill-appearing, toxic-appearing or diaphoretic.  HENT:     Head: Normocephalic and atraumatic.  Eyes:     General: No scleral icterus.       Right eye: No discharge.        Left eye: No discharge.     Conjunctiva/sclera: Conjunctivae normal.  Cardiovascular:     Rate and Rhythm: Normal rate and regular rhythm.     Heart sounds: Normal heart sounds. No murmur heard. No friction rub. No gallop.   Pulmonary:     Effort: Pulmonary effort is normal. No respiratory distress.     Breath sounds: Normal breath sounds. No stridor. No wheezing, rhonchi or rales.  Musculoskeletal:        General: Normal range of motion.     Cervical back: Normal range of motion.     Comments: Negative Phalen and Tinel signs.  Skin:    General: Skin is warm and dry.  Neurological:     Mental Status: He is alert and oriented to person, place, and time. Mental status is at baseline.  Psychiatric:        Mood and Affect: Mood normal.        Behavior: Behavior normal.        Thought Content: Thought content normal.        Judgment: Judgment normal.     Lab Results  Component Value Date   TSH 0.825 04/19/2019   Lab Results  Component Value Date   WBC 6.1 10/18/2019   HGB 15.5 10/18/2019   HCT 46.3 10/18/2019   MCV 97 10/18/2019   PLT 134 (L) 10/18/2019   Lab Results  Component Value Date   NA 142 10/18/2019   K 4.9 10/18/2019   CO2 26 10/18/2019   GLUCOSE 95 10/18/2019   BUN 16 10/18/2019   CREATININE 1.15 10/18/2019   BILITOT 0.5  10/18/2019   ALKPHOS 71 10/18/2019   AST 31 10/18/2019   ALT 32 10/18/2019   PROT 6.1 10/18/2019   ALBUMIN 4.4 10/18/2019   CALCIUM 9.6 10/18/2019   ANIONGAP 8 11/21/2018   Lab Results  Component Value Date   CHOL 190 10/18/2019   Lab Results  Component Value Date   HDL 90 10/18/2019   Lab Results  Component Value Date   LDLCALC  90 10/18/2019   Lab Results  Component Value Date   TRIG 52 10/18/2019   Lab Results  Component Value Date   CHOLHDL 2.1 10/18/2019   Lab Results  Component Value Date   HGBA1C 5.5 03/26/2016

## 2020-04-25 LAB — CBC WITH DIFFERENTIAL/PLATELET
Basophils Absolute: 0.1 10*3/uL (ref 0.0–0.2)
Basos: 1 %
EOS (ABSOLUTE): 0.2 10*3/uL (ref 0.0–0.4)
Eos: 2 %
Hematocrit: 50.3 % (ref 37.5–51.0)
Hemoglobin: 16.9 g/dL (ref 13.0–17.7)
Immature Grans (Abs): 0.1 10*3/uL (ref 0.0–0.1)
Immature Granulocytes: 1 %
Lymphocytes Absolute: 1.3 10*3/uL (ref 0.7–3.1)
Lymphs: 13 %
MCH: 32.8 pg (ref 26.6–33.0)
MCHC: 33.6 g/dL (ref 31.5–35.7)
MCV: 98 fL — ABNORMAL HIGH (ref 79–97)
Monocytes Absolute: 1.1 10*3/uL — ABNORMAL HIGH (ref 0.1–0.9)
Monocytes: 11 %
Neutrophils Absolute: 7.2 10*3/uL — ABNORMAL HIGH (ref 1.4–7.0)
Neutrophils: 72 %
Platelets: 175 10*3/uL (ref 150–450)
RBC: 5.15 x10E6/uL (ref 4.14–5.80)
RDW: 12.4 % (ref 11.6–15.4)
WBC: 9.9 10*3/uL (ref 3.4–10.8)

## 2020-04-25 LAB — CMP14+EGFR
ALT: 28 IU/L (ref 0–44)
AST: 30 IU/L (ref 0–40)
Albumin/Globulin Ratio: 2.1 (ref 1.2–2.2)
Albumin: 4.4 g/dL (ref 3.7–4.7)
Alkaline Phosphatase: 90 IU/L (ref 44–121)
BUN/Creatinine Ratio: 13 (ref 10–24)
BUN: 15 mg/dL (ref 8–27)
Bilirubin Total: 0.8 mg/dL (ref 0.0–1.2)
CO2: 24 mmol/L (ref 20–29)
Calcium: 9.7 mg/dL (ref 8.6–10.2)
Chloride: 100 mmol/L (ref 96–106)
Creatinine, Ser: 1.15 mg/dL (ref 0.76–1.27)
GFR calc Af Amer: 69 mL/min/{1.73_m2} (ref >59)
GFR calc non Af Amer: 60 mL/min/{1.73_m2} (ref >59)
Globulin, Total: 2.1 g/dL (ref 1.5–4.5)
Glucose: 91 mg/dL (ref 65–99)
Potassium: 5 mmol/L (ref 3.5–5.2)
Sodium: 139 mmol/L (ref 134–144)
Total Protein: 6.5 g/dL (ref 6.0–8.5)

## 2020-04-25 LAB — LIPID PANEL
Chol/HDL Ratio: 2.2 ratio (ref 0.0–5.0)
Cholesterol, Total: 196 mg/dL (ref 100–199)
HDL: 90 mg/dL (ref >39)
LDL Chol Calc (NIH): 95 mg/dL (ref 0–99)
Triglycerides: 61 mg/dL (ref 0–149)
VLDL Cholesterol Cal: 11 mg/dL (ref 5–40)

## 2020-05-01 ENCOUNTER — Ambulatory Visit: Payer: Medicare HMO | Admitting: Orthopaedic Surgery

## 2020-05-01 ENCOUNTER — Other Ambulatory Visit: Payer: Self-pay

## 2020-05-02 DIAGNOSIS — R519 Headache, unspecified: Secondary | ICD-10-CM | POA: Diagnosis not present

## 2020-05-02 DIAGNOSIS — R0981 Nasal congestion: Secondary | ICD-10-CM | POA: Diagnosis not present

## 2020-05-02 DIAGNOSIS — R059 Cough, unspecified: Secondary | ICD-10-CM | POA: Diagnosis not present

## 2020-05-02 DIAGNOSIS — J329 Chronic sinusitis, unspecified: Secondary | ICD-10-CM | POA: Diagnosis not present

## 2020-05-06 ENCOUNTER — Ambulatory Visit: Payer: Medicare HMO

## 2020-05-07 ENCOUNTER — Ambulatory Visit (INDEPENDENT_AMBULATORY_CARE_PROVIDER_SITE_OTHER): Payer: Medicare HMO

## 2020-05-07 DIAGNOSIS — I495 Sick sinus syndrome: Secondary | ICD-10-CM | POA: Diagnosis not present

## 2020-05-07 LAB — CUP PACEART REMOTE DEVICE CHECK
Battery Remaining Longevity: 138 mo
Battery Remaining Percentage: 95.5 %
Battery Voltage: 3.02 V
Brady Statistic RV Percent Paced: 44 %
Date Time Interrogation Session: 20220301045942
Implantable Lead Implant Date: 19980120
Implantable Lead Implant Date: 20050119
Implantable Lead Location: 753859
Implantable Lead Location: 753860
Implantable Pulse Generator Implant Date: 20200917
Lead Channel Impedance Value: 760 Ohm
Lead Channel Pacing Threshold Amplitude: 0.5 V
Lead Channel Pacing Threshold Pulse Width: 0.4 ms
Lead Channel Sensing Intrinsic Amplitude: 12 mV
Lead Channel Setting Pacing Amplitude: 2.5 V
Lead Channel Setting Pacing Pulse Width: 0.4 ms
Lead Channel Setting Sensing Sensitivity: 2 mV
Pulse Gen Model: 2272
Pulse Gen Serial Number: 9154140

## 2020-05-14 ENCOUNTER — Ambulatory Visit: Payer: Medicare HMO | Admitting: Orthopaedic Surgery

## 2020-05-15 ENCOUNTER — Other Ambulatory Visit: Payer: Self-pay

## 2020-05-15 ENCOUNTER — Encounter: Payer: Self-pay | Admitting: Orthopaedic Surgery

## 2020-05-15 ENCOUNTER — Ambulatory Visit (INDEPENDENT_AMBULATORY_CARE_PROVIDER_SITE_OTHER): Payer: Medicare HMO | Admitting: Orthopaedic Surgery

## 2020-05-15 VITALS — Ht 64.0 in | Wt 192.0 lb

## 2020-05-15 DIAGNOSIS — G5602 Carpal tunnel syndrome, left upper limb: Secondary | ICD-10-CM | POA: Insufficient documentation

## 2020-05-15 DIAGNOSIS — R200 Anesthesia of skin: Secondary | ICD-10-CM

## 2020-05-15 NOTE — Progress Notes (Signed)
Remote pacemaker transmission.   

## 2020-05-15 NOTE — Progress Notes (Signed)
Office Visit Note   Patient: Peter Howe           Date of Birth: 01/06/1941           MRN: [id number] Visit Date: 05/15/2020              Requested by: Loman Brooklyn, [place],  [place] [id number] PCP: Loman Brooklyn, FNP   Assessment & Plan: Visit Diagnoses:  1. Numbness of left hand   2. Carpal tunnel syndrome, left upper limb     Plan: Mr. Novelo has been experiencing numbness and tingling in his left nondominant hand for at least 6 months.  He is dropping objects and having difficulty driving a car and having to wake up at night and shake his hands.  Clinically he has carpal tunnel syndrome.  Will order EMGs and nerve conduction studies.  He is right-hand dominant having few symptoms on that side  Follow-Up Instructions: Return After EMGs nerve conduction studies both upper extremities.   Orders:  Orders Placed This Encounter  Procedures  . Ambulatory referral to Physical Medicine Rehab   No orders of the defined types were placed in this encounter.     Procedures: No procedures performed   Clinical Data: No additional findings.   Subjective: Chief Complaint  Patient presents with  . Left Hand - Numbness  Patient presents today for his left hand. He states that it has been numb and tingling for about 6 months. He states that he has been dropping things, and it seems to be getting worse. No pain. If he shakes his hand the numbness goes away. He is right hand dominant.   HPI  Review of Systems   Objective: Vital Signs: Ht 5\' 4"  (1.626 m)   Wt 192 lb (87.1 kg)   BMI 32.96 kg/m   Physical Exam Constitutional:      Appearance: He is well-developed and well-nourished.  HENT:     Mouth/Throat:     Mouth: Oropharynx is clear and moist.  Eyes:     Extraocular Movements: EOM normal.     Pupils: Pupils are equal, round, and reactive to light.  Pulmonary:     Effort: Pulmonary effort is normal.  Skin:    General: Skin is warm and  dry.  Neurological:     Mental Status: He is alert and oriented to person, place, and time.  Psychiatric:        Mood and Affect: Mood and affect normal.        Behavior: Behavior normal.     Ortho Exam left hand with tingling over the median nerve consistent with a positive Tinel's.  Does have positive Phalen's.  Good grip and release.  Normal opposition of thumb to little finger with good strength.  No obvious intrinsic atrophy.  Good sensation to the fingers  Specialty Comments:  No specialty comments available.  Imaging: No results found.   PMFS History: Patient Active Problem List   Diagnosis Date Noted  . Carpal tunnel syndrome, left upper limb 05/15/2020  . 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 Controlled substance agreement signed 05/19/2019  . Elevated liver enzymes 06/08/2017  . 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 Abdominal distension   . Abnormal EKG   . Gastroesophageal reflux disease 02/05/2016  . Chest pain 11/05/2015  . Abdominal pain 11/05/2015  . Essential hypertension with goal blood pressure less than 130/80 09/18/2013  . ATRIAL FIBRILLATION 01/02/2010  . BMI 33.0-33.9,adult 12/05/2008  . SICK  SINUS/ TACHY-BRADY SYNDROME 12/05/2008   Past Medical History:  Diagnosis Date  . Hypertension   . Melanoma ([place])   . Overweight(278.02)    obesity  . Permanent atrial fibrillation ([place])   . 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 Cancer Mother 31  . CAD Father 77  . Heart disease Daughter   . Stroke Sister   . Cancer Sister   . Cancer Sister   . Diabetes Brother   . Cancer Brother   . Alcohol abuse Brother     Past Surgical History:  Procedure Laterality Date  . ARM SKIN LESION BIOPSY / EXCISION Left    Melanoma -removed by Dr. Nevada Crane  . PACEMAKER INSERTION  03/28/03   St. Jude-PPM  . PPM GENERATOR CHANGEOUT N/A 11/24/2018   Procedure: PPM GENERATOR CHANGEOUT;  Surgeon: [name], MD;  Location: [facility];  Service: Cardiovascular;  Laterality: N/A;   Social History   Occupational History  . Occupation: runs a Engineer, technical sales    Comment: part time  Tobacco Use  . Smoking status: Former Smoker    Packs/day: 1.00    Years: 30.00    Pack years: 30.00    Types: Cigarettes    Start date: 03/30/1955    Quit date: 03/09/1993    Years since quitting: 27.2  . Smokeless tobacco: Never Used  Vaping Use  . Vaping Use: Never used  Substance and Sexual Activity  . Alcohol use: Yes    Alcohol/week: 7.0 standard drinks    Types: 1 Glasses of wine, 2 Shots of liquor, 4 Cans of beer per week  . Drug use: No  . Sexual activity: Not Currently

## 2020-06-03 ENCOUNTER — Ambulatory Visit (INDEPENDENT_AMBULATORY_CARE_PROVIDER_SITE_OTHER): Payer: Medicare HMO

## 2020-06-03 DIAGNOSIS — Z1212 Encounter for screening for malignant neoplasm of rectum: Secondary | ICD-10-CM

## 2020-06-03 DIAGNOSIS — Z0000 Encounter for general adult medical examination without abnormal findings: Secondary | ICD-10-CM | POA: Diagnosis not present

## 2020-06-03 NOTE — Progress Notes (Signed)
MEDICARE ANNUAL WELLNESS VISIT  06/03/2020  Telephone Visit Disclaimer This Medicare AWV was conducted by telephone due to national recommendations for restrictions regarding the COVID-19 Pandemic (e.g. social distancing).  I verified, using two identifiers, that I am speaking with Peter Howe or their authorized healthcare agent. I discussed the limitations, risks, security, and privacy concerns of performing an evaluation and management service by telephone and the potential availability of an in-person appointment in the future. The patient expressed understanding and agreed to proceed.  Location of Patient: Home Location of Provider (nurse):  [facility]  Subjective:    Peter Howe is a 80 y.o. male patient of Loman Brooklyn, FNP who had a Medicare Annual Wellness Visit today via telephone. Peter Howe lives here in [place] and is a very pleasant man. He is a widow and lives alone. He has a granddaughter that lives in [facility]. He is retired and was in [employer] for years. He is very active and plays golf often.   Patient Care Team: Loman Brooklyn, FNP as PCP - General (Family Medicine) [name], MD as PCP - Electrophysiology (Cardiology)  Advanced Directives 06/03/2020 05/02/2019 11/24/2018 03/26/2016 11/05/2015 11/05/2015  Does Patient Have a Medical Advance Directive? No No No No No;Yes No  Would patient like information on creating a medical advance directive? No - Patient declined No - Patient declined No - Patient declined No - Patient declined - -    Hospital Utilization Over the Past 12 Months: # of hospitalizations or ER visits: 0 # of surgeries: 0  Review of Systems    Patient reports that his overall health is worse compared to last year.  History obtained from chart review  Patient Reported Readings (BP, Pulse, CBG, Weight, etc) none  Pain Assessment Pain : No/denies pain     Current Medications & Allergies  (verified) Allergies as of 06/03/2020      Reactions   Penicillins Rash   Has patient had a PCN reaction causing immediate rash, facial/tongue/throat swelling, SOB or lightheadedness with hypotension: No Has patient had a PCN reaction causing severe rash involving mucus membranes or skin necrosis: No Has patient had a PCN reaction that required hospitalization No Has patient had a PCN reaction occurring within the last 10 years: No If all of the above answers are "NO", then may proceed with Cephalosporin use.   Sulfonamide Derivatives Rash      Medication List       Accurate as of June 03, 2020  3:46 PM. If you have any questions, ask your nurse or doctor.        acetaminophen 325 MG tablet Commonly known as: TYLENOL Take 650 mg by mouth every 6 (six) hours as needed for mild pain.   atenolol 100 MG tablet Commonly known as: TENORMIN Take 1 tablet (100 mg total) by mouth daily.   colchicine 0.6 MG tablet Take 0.6 mg by mouth 2 (two) times daily as needed (gout).   losartan 100 MG tablet Commonly known as: COZAAR Take 0.5 tablets (50 mg total) by mouth daily.   multivitamin with minerals Tabs tablet Take 1 tablet by mouth daily.   nitroGLYCERIN 0.4 MG SL tablet Commonly known as: NITROSTAT Place 1 tablet (0.4 mg total) under the tongue every 5 (five) minutes as needed for chest pain.   Xarelto 20 MG Tabs tablet Generic drug: rivaroxaban Take 1 tablet (20 mg total) by mouth daily with supper.   zolpidem  5 MG tablet Commonly known as: AMBIEN Take 1 tablet (5 mg total) by mouth at bedtime.       History (reviewed): Past Medical History:  Diagnosis Date  . Cancer ([place])   . Hypertension   . Overweight(278.02)    obesity  . Permanent atrial fibrillation ([place])   . 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 ARM SKIN LESION BIOPSY / EXCISION Left    Melanoma -removed by Dr. Nevada Crane  . PACEMAKER INSERTION   03/28/03   St. Jude-PPM  . PPM GENERATOR CHANGEOUT N/A 11/24/2018   Procedure: PPM GENERATOR CHANGEOUT;  Surgeon: [name], MD;  Location: [facility];  Service: Cardiovascular;  Laterality: N/A;   Family History  Problem Relation Age of Onset  . Cancer Mother 24  . CAD Father 48  . Heart disease Daughter   . Stroke Sister   . Cancer Sister   . Cancer Sister   . Diabetes Brother   . Cancer Brother   . Alcohol abuse Brother    Social History   Socioeconomic History  . Marital status: Widowed    Spouse name: Not on file  . Number of children: 1  . Years of education: 58  . Highest education level: High school graduate  Occupational History  . Occupation: runs a Engineer, technical sales    Comment: part time  Tobacco Use  . Smoking status: Former Smoker    Packs/day: 1.00    Years: 30.00    Pack years: 30.00    Types: Cigarettes    Start date: 03/30/1955    Quit date: 03/09/1993    Years since quitting: 27.2  . Smokeless tobacco: Never Used  Vaping Use  . Vaping Use: Never used  Substance and Sexual Activity  . Alcohol use: Yes    Alcohol/week: 7.0 standard drinks    Types: 1 Glasses of wine, 4 Cans of beer, 2 Shots of liquor per week  . Drug use: No  . Sexual activity: Not Currently  Other Topics Concern  . Not on file  Social History Narrative   Owns a Engineer, technical sales, enjoys golf   Lives in [place] Strain: Not on file  Food Insecurity: Not on file  Transportation Needs: Not on file  Physical Activity: Not on file  Stress: Not on file  Social Connections: Not on file    Activities of Daily Living In your present state of health, do you have any difficulty performing the following activities: 06/03/2020  Hearing? N  Vision? N  Difficulty concentrating or making decisions? N  Walking or climbing stairs? N  Dressing or bathing? N  Doing errands, shopping? N  Preparing Food and eating ? N  Using the  Toilet? N  In the past six months, have you accidently leaked urine? N  Do you have problems with loss of bowel control? N  Managing your Medications? N  Managing your Finances? N  Housekeeping or managing your Housekeeping? N  Some recent data might be hidden    Patient Education/ Literacy How often do you need to have someone help you when you read instructions, pamphlets, or other written materials from your doctor or pharmacy?: 1 - Never What is the last grade level you completed in school?: 12th grade  Exercise Current Exercise Habits: Home exercise routine;The patient does not participate in regular exercise at present, Exercise limited by:  None identified  Diet Patient reports consuming 3 meals a day and 1 snack(s) a day Patient reports that his primary diet is: Regular Patient reports that she does have regular access to food.   Depression Screen PHQ 2/9 Scores 06/03/2020 04/24/2020 10/18/2019 06/05/2019 05/19/2019 05/02/2019 04/19/2019  PHQ - 2 Score 0 0 0 0 0 0 0     Fall Risk Fall Risk  06/03/2020 04/24/2020 10/18/2019 06/05/2019 05/19/2019  Falls in the past year? 0 0 0 0 0     Objective:  Peter Howe seemed alert and oriented and he participated appropriately during our telephone visit.  Blood Pressure Weight BMI  BP Readings from Last 3 Encounters:  04/24/20 (!) 143/74  03/05/20 122/70  10/18/19 124/68   Wt Readings from Last 3 Encounters:  05/15/20 192 lb (87.1 kg)  04/24/20 192 lb 3.2 oz (87.2 kg)  03/05/20 188 lb (85.3 kg)   BMI Readings from Last 1 Encounters:  05/15/20 32.96 kg/m    *Unable to obtain current vital signs, weight, and BMI due to telephone visit type  Hearing/Vision  . Amitai did not seem to have difficulty with hearing/understanding during the telephone conversation . Reports that he has had a formal eye exam by an eye care professional within the past year . Reports that he has not had a formal hearing evaluation within the past  year *Unable to fully assess hearing and vision during telephone visit type  Cognitive Function: 6CIT Screen 06/03/2020 05/02/2019  What Year? 0 points 0 points  What month? 0 points 0 points  What time? 0 points 0 points  Count back from 20 0 points 0 points  Months in reverse 0 points 0 points  Repeat phrase 0 points 0 points  Total Score 0 0   (Normal:0-7, Significant for Dysfunction: >8)  Normal Cognitive Function Screening: Yes   Immunization & Health Maintenance Record Immunization History  Administered Date(s) Administered  . Fluad Quad(high Dose 65+) 02/26/2020  . Influenza, High Dose Seasonal PF 02/05/2016, 12/24/2016, 01/31/2018  . Influenza-Unspecified 01/31/2018, 12/08/2018  . Moderna SARS-COV2 Booster Vaccination 02/21/2020  . Moderna Sars-Covid-2 Vaccination 04/01/2019, 04/29/2019, 02/21/2020  . Pneumococcal Conjugate-13 12/15/2018  . Pneumococcal Polysaccharide-23 04/24/2020    Health Maintenance  Topic Date Due  . TETANUS/TDAP  Never done  . INFLUENZA VACCINE  Completed  . COVID-19 Vaccine  Completed  . PNA vac Low Risk Adult  Completed  . HPV VACCINES  Aged Out       Assessment  This is a routine wellness examination for Peter Howe.  Health Maintenance: Due or Overdue Health Maintenance Due  Topic Date Due  . TETANUS/TDAP  Never done    Peter Howe does not need a referral for Community Assistance: Care Management:   no Social Work:    no Prescription Assistance:  no Nutrition/Diabetes Education:  no   Plan:  Personalized Goals Goals Addressed            This Visit's Progress   . DIET - EAT MORE FRUITS AND VEGETABLES        Personalized Health Maintenance & Screening Recommendations  Td vaccine  Lung Cancer Screening Recommended: no (Low Dose CT Chest recommended if Age 62-80 years, 30 pack-year currently smoking OR have quit w/in past 15 years) Hepatitis C Screening recommended: not applicable HIV Screening  recommended: no  Advanced Directives: Written information was not prepared per patient's request.  Referrals & Orders No orders of the defined types were placed in  this encounter.   Follow-up Plan . Follow-up with Loman Brooklyn, FNP as planned . Check price of tetanus at next office visit .    I have personally reviewed and noted the following in the patient's chart:   . Medical and social history . Use of alcohol, tobacco or illicit drugs  . Current medications and supplements . Functional ability and status . Nutritional status . Physical activity . Advanced directives . List of other physicians . Hospitalizations, surgeries, and ER visits in previous 12 months . Vitals . Screenings to include cognitive, depression, and falls . Referrals and appointments  In addition, I have reviewed and discussed with Peter Howe certain preventive protocols, quality metrics, and best practice recommendations. A written personalized care plan for preventive services as well as general preventive health recommendations is available and can be mailed to the patient at his request.      Rolena Infante LPN [id number]/[id number]

## 2020-06-03 NOTE — Patient Instructions (Signed)
  Mr. Vanderveer , Thank you for taking time to come for your Medicare Wellness Visit. I appreciate your ongoing commitment to your health goals. Please review the following plan we discussed and let me know if I can assist you in the future.   These are the goals we discussed: Goals    . DIET - EAT MORE FRUITS AND VEGETABLES    . DIET - INCREASE WATER INTAKE     Try to drink 6-8 glasses of water daily       This is a list of the screening recommended for you and due dates:  Health Maintenance  Topic Date Due  . Tetanus Vaccine  Never done  . Flu Shot  Completed  . COVID-19 Vaccine  Completed  . Pneumonia vaccines  Completed  . HPV Vaccine  Aged Out

## 2020-06-06 ENCOUNTER — Encounter: Payer: Self-pay | Admitting: Family Medicine

## 2020-06-06 ENCOUNTER — Ambulatory Visit (INDEPENDENT_AMBULATORY_CARE_PROVIDER_SITE_OTHER): Payer: Medicare HMO | Admitting: Family Medicine

## 2020-06-06 DIAGNOSIS — K219 Gastro-esophageal reflux disease without esophagitis: Secondary | ICD-10-CM | POA: Diagnosis not present

## 2020-06-06 DIAGNOSIS — R197 Diarrhea, unspecified: Secondary | ICD-10-CM

## 2020-06-06 MED ORDER — OMEPRAZOLE 20 MG PO CPDR: 20.0000 mg | DELAYED_RELEASE_CAPSULE | Freq: Every day | ORAL | 3 refills | Status: DC

## 2020-06-06 MED ORDER — FAMOTIDINE 20 MG PO TABS: 20.0000 mg | ORAL_TABLET | Freq: Two times a day (BID) | ORAL | 0 refills | Status: DC

## 2020-06-06 NOTE — Progress Notes (Signed)
   Virtual Visit  Note Due to COVID-19 pandemic this visit was conducted virtually. This visit type was conducted due to national recommendations for restrictions regarding the COVID-19 Pandemic (e.g. social distancing, sheltering in place) in an effort to limit this patient's exposure and mitigate transmission in our community. All issues noted in this document were discussed and addressed.  A physical exam was not performed with this format.  I connected with Peter Howe on 06/06/20 at [phone] by phone and verified that I am speaking with the correct person using two identifiers. Peter Howe is currently located at home and no one is currently with him during visit. The provider, Gwenlyn Perking, FNP is located in their office at time of visit.  I discussed the limitations, risks, security and privacy concerns of performing an evaluation and management service by phone and the availability of in person appointments. I also discussed with the patient that there may be a patient responsible charge related to this service. The patient expressed understanding and agreed to proceed.  CC: indigestion, diarrhea  History and Present Illness:  HPI  Peter Howe reports indigestion, nausea, and diarrhea intermittently for about a month. The symptoms seemed to worsen this past week. He also repots increased belching and flatus. He has had one episode of NBNB vomiting this week. He reports 3-4 loose to watery BMs a day this week, worse in the morning. He reports some intermittent pain in her upper abdomen. He denies dizziness or lightheadedness. He is staying well hydrated with water and gatorade. He has taken pepto with some relief. He has taken imodium with improvement but he prefers not to take this. Denies fever or changes in diet.    ROS As per HPI.   Observations/Objective: Alert and oriented x 3. Able to speak in full sentences without difficulty.   Assessment and Plan: [name] was seen today  for gastroesophageal reflux and diarrhea.  Diagnoses and all orders for this visit:  Gastroesophageal reflux disease without esophagitis Start omeprazole daily. Can take Pepcid BID as needed until omeprazole is effective. Avoid fried, greasy foods.  -     omeprazole (PRILOSEC) 20 MG capsule; Take 1 capsule (20 mg total) by mouth daily. -     famotidine (PEPCID) 20 MG tablet; Take 1 tablet (20 mg total) by mouth 2 (two) times daily.  Diarrhea, unspecified type Stool culture pending. Patient declined imodium. Stay well hydrated.  -     Stool culture; Future   Follow Up Instructions: Return to office for new or worsening symptoms, or if symptoms persist.     I discussed the assessment and treatment plan with the patient. The patient was provided an opportunity to ask questions and all were answered. The patient agreed with the plan and demonstrated an understanding of the instructions.   The patient was advised to call back or seek an in-person evaluation if the symptoms worsen or if the condition fails to improve as anticipated.  The above assessment and management plan was discussed with the patient. The patient verbalized understanding of and has agreed to the management plan. Patient is aware to call the clinic if symptoms persist or worsen. Patient is aware when to return to the clinic for a follow-up visit. Patient educated on when it is appropriate to go to the emergency department.   Time call ended:  0910  I provided 12 minutes of phone time during this encounter.    Gwenlyn Perking, FNP

## 2020-06-10 ENCOUNTER — Ambulatory Visit (INDEPENDENT_AMBULATORY_CARE_PROVIDER_SITE_OTHER): Payer: Medicare HMO | Admitting: Physical Medicine and Rehabilitation

## 2020-06-10 ENCOUNTER — Encounter: Payer: Self-pay | Admitting: Physical Medicine and Rehabilitation

## 2020-06-10 ENCOUNTER — Other Ambulatory Visit: Payer: Self-pay

## 2020-06-10 DIAGNOSIS — R202 Paresthesia of skin: Secondary | ICD-10-CM | POA: Diagnosis not present

## 2020-06-10 NOTE — Progress Notes (Signed)
   Numeric Pain Rating Scale and Functional Assessment Average Pain 4   In the last MONTH (on 0-10 scale) has pain interfered with the following?  1. General activity like being  able to carry out your everyday physical activities such as walking, climbing stairs, carrying groceries, or moving a chair?  Rating(8)

## 2020-06-10 NOTE — Progress Notes (Signed)
Peter Howe - 80 y.o. male MRN [id number]  Date of birth: 1940-04-20  Office Visit Note: Visit Date: 06/10/2020 PCP: Loman Brooklyn, FNP Referred by: Loman Brooklyn, FNP  Subjective: Chief Complaint  Patient presents with  . Left Hand - Numbness, Weakness   HPI:  Peter Howe is a 80 y.o. male who comes in today at the request of Dr. Joni Fears for electrodiagnostic study of the Bilateral upper extremities.  Patient is Right hand dominant. He describes pain and numbness in the left hand. Non-dermatomal and global. He reports worsening at night and with driving. He also reports weakness and dropping objects. He does get relief with pain medication and change of position. He denies any prior electrodiagnostic study or frank radicular symptoms.   ROS Otherwise per HPI.  Assessment & Plan: Visit Diagnoses:    ICD-10-CM   1. Paresthesia of skin  R20.2 NCV with EMG (electromyography)    Plan: Impression: The above electrodiagnostic study is ABNORMAL and reveals evidence of:  1.  A moderate left median nerve entrapment at the wrist (carpal tunnel syndrome) affecting sensory and motor components.   2.  A mild right median nerve entrapment at the wrist (carpal tunnel syndrome) affecting sensory components.   There is no significant electrodiagnostic evidence of any other focal nerve entrapment, brachial plexopathy or cervical radiculopathy. The above electrodiagnostic study is ABNORMAL and reveals evidence of   Recommendations: 1.  Follow-up with referring physician. 2.  Continue current management of symptoms. 3.  Suggest surgical evaluation.  Meds & Orders: No orders of the defined types were placed in this encounter.   Orders Placed This Encounter  Procedures  . NCV with EMG (electromyography)    Follow-up: Return for Joni Fears, MD.   Procedures: No procedures performed  EMG & NCV Findings: Evaluation of the left median motor nerve showed prolonged  distal onset latency (4.3 ms), reduced amplitude (3.6 mV), and decreased conduction velocity (Elbow-Wrist, 48 m/s).  The left median (across palm) sensory and the right median (across palm) sensory nerves showed prolonged distal peak latency (Wrist, L3.8, R4.1 ms).  All remaining nerves (as indicated in the following tables) were within normal limits.  All left vs. right side differences were within normal limits.    All examined muscles (as indicated in the following table) showed no evidence of electrical instability.    Impression: The above electrodiagnostic study is ABNORMAL and reveals evidence of:  1.  A moderate left median nerve entrapment at the wrist (carpal tunnel syndrome) affecting sensory and motor components.   2.  A mild right median nerve entrapment at the wrist (carpal tunnel syndrome) affecting sensory components.   There is no significant electrodiagnostic evidence of any other focal nerve entrapment, brachial plexopathy or cervical radiculopathy. The above electrodiagnostic study is ABNORMAL and reveals evidence of   Recommendations: 1.  Follow-up with referring physician. 2.  Continue current management of symptoms. 3.  Suggest surgical evaluation.  ___________________________ Laurence Spates FAAPMR Board Certified, American Board of Physical Medicine and Rehabilitation    Nerve Conduction Studies Anti Sensory Summary Table   Stim Site NR Peak (ms) Norm Peak (ms) P-T Amp (V) Norm P-T Amp Site1 Site2 Delta-P (ms) Dist (cm) Vel (m/s) Norm Vel (m/s)  Left Median Acr Palm Anti Sensory (2nd Digit)  30.1C  Wrist    *3.8 <3.6 17.6 >10 Wrist Palm 1.9 0.0    Palm    1.9 <2.0 20.1  Right Median Acr Palm Anti Sensory (2nd Digit)  29.2C  Wrist    *4.1 <3.6 18.1 >10 Wrist Palm 2.2 0.0    Palm    1.9 <2.0 25.2         Left Radial Anti Sensory (Base 1st Digit)  30.4C  Wrist    2.0 <3.1 12.6  Wrist Base 1st Digit 2.0 0.0    Left Ulnar Anti Sensory (5th Digit)  30.6C   Wrist    3.4 <3.7 23.5 >15.0 Wrist 5th Digit 3.4 14.0 41 >38   Motor Summary Table   Stim Site NR Onset (ms) Norm Onset (ms) O-P Amp (mV) Norm O-P Amp Site1 Site2 Delta-0 (ms) Dist (cm) Vel (m/s) Norm Vel (m/s)  Left Median Motor (Abd Poll Brev)  30.7C  Wrist    *4.3 <4.2 *3.6 >5 Elbow Wrist 4.4 21.0 *48 >50  Elbow    8.7  3.6         Right Median Motor (Abd Poll Brev)  29.6C  Wrist    4.0 <4.2 6.5 >5 Elbow Wrist 3.9 20.5 53 >50  Elbow    7.9  6.0         Left Ulnar Motor (Abd Dig Min)  30.9C  Wrist    3.0 <4.2 8.8 >3 B Elbow Wrist 2.9 18.5 64 >53  B Elbow    5.9  8.2  A Elbow B Elbow 1.3 10.0 77 >53  A Elbow    7.2  8.0          EMG   Side Muscle Nerve Root Ins Act Fibs Psw Amp Dur Poly Recrt Int Fraser Din Comment  Left Abd Poll Brev Median C8-T1 Nml Nml Nml Nml Nml 0 Nml Nml   Left 1stDorInt Ulnar C8-T1 Nml Nml Nml Nml Nml 0 Nml Nml   Left PronatorTeres Median C6-7 Nml Nml Nml Nml Nml 0 Nml Nml   Left Biceps Musculocut C5-6 Nml Nml Nml Nml Nml 0 Nml Nml     Nerve Conduction Studies Anti Sensory Left/Right Comparison   Stim Site L Lat (ms) R Lat (ms) L-R Lat (ms) L Amp (V) R Amp (V) L-R Amp (%) Site1 Site2 L Vel (m/s) R Vel (m/s) L-R Vel (m/s)  Median Acr Palm Anti Sensory (2nd Digit)  30.1C  Wrist *3.8 *4.1 0.3 17.6 18.1 2.8 Wrist Palm     Palm 1.9 1.9 0.0 20.1 25.2 20.2       Radial Anti Sensory (Base 1st Digit)  30.4C  Wrist 2.0   12.6   Wrist Base 1st Digit     Ulnar Anti Sensory (5th Digit)  30.6C  Wrist 3.4   23.5   Wrist 5th Digit 41     Motor Left/Right Comparison   Stim Site L Lat (ms) R Lat (ms) L-R Lat (ms) L Amp (mV) R Amp (mV) L-R Amp (%) Site1 Site2 L Vel (m/s) R Vel (m/s) L-R Vel (m/s)  Median Motor (Abd Poll Brev)  30.7C  Wrist *4.3 4.0 0.3 *3.6 6.5 44.6 Elbow Wrist *48 53 5  Elbow 8.7 7.9 0.8 3.6 6.0 40.0       Ulnar Motor (Abd Dig Min)  30.9C  Wrist 3.0   8.8   B Elbow Wrist 64    B Elbow 5.9   8.2   A Elbow B Elbow 77    A Elbow 7.2   8.0             Waveforms:  Clinical History: No specialty comments available.     Objective:  VS:  HT:    WT:   BMI:     BP:   HR: bpm  TEMP: ( )  RESP:  Physical Exam Musculoskeletal:        General: No tenderness.     Comments: Inspection reveals no atrophy of the bilateral APB or FDI or hand intrinsics. There is no swelling, color changes, allodynia or dystrophic changes. There is 5 out of 5 strength in the bilateral wrist extension, finger abduction and long finger flexion. There is intact sensation to light touch in all dermatomal and peripheral nerve distributions.  There is a positive Phalen's test on the left. There is a negative Hoffmann's test bilaterally.  Skin:    General: Skin is warm and dry.     Findings: No erythema or rash.  Neurological:     General: No focal deficit present.     Mental Status: He is alert and oriented to person, place, and time.     Sensory: No sensory deficit.     Motor: No weakness or abnormal muscle tone.     Coordination: Coordination normal.     Gait: Gait normal.  Psychiatric:        Mood and Affect: Mood normal.        Behavior: Behavior normal.        Thought Content: Thought content normal.      Imaging: No results found.

## 2020-06-10 NOTE — Procedures (Signed)
EMG & NCV Findings: Evaluation of the left median motor nerve showed prolonged distal onset latency (4.3 ms), reduced amplitude (3.6 mV), and decreased conduction velocity (Elbow-Wrist, 48 m/s).  The left median (across palm) sensory and the right median (across palm) sensory nerves showed prolonged distal peak latency (Wrist, L3.8, R4.1 ms).  All remaining nerves (as indicated in the following tables) were within normal limits.  All left vs. right side differences were within normal limits.    All examined muscles (as indicated in the following table) showed no evidence of electrical instability.    Impression: The above electrodiagnostic study is ABNORMAL and reveals evidence of:  1.  A moderate left median nerve entrapment at the wrist (carpal tunnel syndrome) affecting sensory and motor components.   2.  A mild right median nerve entrapment at the wrist (carpal tunnel syndrome) affecting sensory components.   There is no significant electrodiagnostic evidence of any other focal nerve entrapment, brachial plexopathy or cervical radiculopathy. The above electrodiagnostic study is ABNORMAL and reveals evidence of   Recommendations: 1.  Follow-up with referring physician. 2.  Continue current management of symptoms. 3.  Suggest surgical evaluation.  ___________________________ Peter Howe FAAPMR Board Certified, American Board of Physical Medicine and Rehabilitation    Nerve Conduction Studies Anti Sensory Summary Table   Stim Site NR Peak (ms) Norm Peak (ms) P-T Amp (V) Norm P-T Amp Site1 Site2 Delta-P (ms) Dist (cm) Vel (m/s) Norm Vel (m/s)  Left Median Acr Palm Anti Sensory (2nd Digit)  30.1C  Wrist    *3.8 <3.6 17.6 >10 Wrist Palm 1.9 0.0    Palm    1.9 <2.0 20.1         Right Median Acr Palm Anti Sensory (2nd Digit)  29.2C  Wrist    *4.1 <3.6 18.1 >10 Wrist Palm 2.2 0.0    Palm    1.9 <2.0 25.2         Left Radial Anti Sensory (Base 1st Digit)  30.4C  Wrist    2.0 <3.1  12.6  Wrist Base 1st Digit 2.0 0.0    Left Ulnar Anti Sensory (5th Digit)  30.6C  Wrist    3.4 <3.7 23.5 >15.0 Wrist 5th Digit 3.4 14.0 41 >38   Motor Summary Table   Stim Site NR Onset (ms) Norm Onset (ms) O-P Amp (mV) Norm O-P Amp Site1 Site2 Delta-0 (ms) Dist (cm) Vel (m/s) Norm Vel (m/s)  Left Median Motor (Abd Poll Brev)  30.7C  Wrist    *4.3 <4.2 *3.6 >5 Elbow Wrist 4.4 21.0 *48 >50  Elbow    8.7  3.6         Right Median Motor (Abd Poll Brev)  29.6C  Wrist    4.0 <4.2 6.5 >5 Elbow Wrist 3.9 20.5 53 >50  Elbow    7.9  6.0         Left Ulnar Motor (Abd Dig Min)  30.9C  Wrist    3.0 <4.2 8.8 >3 B Elbow Wrist 2.9 18.5 64 >53  B Elbow    5.9  8.2  A Elbow B Elbow 1.3 10.0 77 >53  A Elbow    7.2  8.0          EMG   Side Muscle Nerve Root Ins Act Fibs Psw Amp Dur Poly Recrt Int Fraser Din Comment  Left Abd Poll Brev Median C8-T1 Nml Nml Nml Nml Nml 0 Nml Nml   Left 1stDorInt Ulnar C8-T1 Nml Nml Nml Nml Nml  0 Nml Nml   Left PronatorTeres Median C6-7 Nml Nml Nml Nml Nml 0 Nml Nml   Left Biceps Musculocut C5-6 Nml Nml Nml Nml Nml 0 Nml Nml     Nerve Conduction Studies Anti Sensory Left/Right Comparison   Stim Site L Lat (ms) R Lat (ms) L-R Lat (ms) L Amp (V) R Amp (V) L-R Amp (%) Site1 Site2 L Vel (m/s) R Vel (m/s) L-R Vel (m/s)  Median Acr Palm Anti Sensory (2nd Digit)  30.1C  Wrist *3.8 *4.1 0.3 17.6 18.1 2.8 Wrist Palm     Palm 1.9 1.9 0.0 20.1 25.2 20.2       Radial Anti Sensory (Base 1st Digit)  30.4C  Wrist 2.0   12.6   Wrist Base 1st Digit     Ulnar Anti Sensory (5th Digit)  30.6C  Wrist 3.4   23.5   Wrist 5th Digit 41     Motor Left/Right Comparison   Stim Site L Lat (ms) R Lat (ms) L-R Lat (ms) L Amp (mV) R Amp (mV) L-R Amp (%) Site1 Site2 L Vel (m/s) R Vel (m/s) L-R Vel (m/s)  Median Motor (Abd Poll Brev)  30.7C  Wrist *4.3 4.0 0.3 *3.6 6.5 44.6 Elbow Wrist *48 53 5  Elbow 8.7 7.9 0.8 3.6 6.0 40.0       Ulnar Motor (Abd Dig Min)  30.9C  Wrist 3.0   8.8   B  Elbow Wrist 64    B Elbow 5.9   8.2   A Elbow B Elbow 77    A Elbow 7.2   8.0            Waveforms:

## 2020-06-11 ENCOUNTER — Other Ambulatory Visit: Payer: Self-pay | Admitting: Family Medicine

## 2020-06-11 ENCOUNTER — Other Ambulatory Visit: Payer: Self-pay

## 2020-06-11 DIAGNOSIS — Z1212 Encounter for screening for malignant neoplasm of rectum: Secondary | ICD-10-CM | POA: Diagnosis not present

## 2020-06-11 NOTE — Addendum Note (Signed)
Addended by: Reather Converse on: 06/11/2020 10:41 AM   Modules accepted: Orders

## 2020-06-12 LAB — FECAL OCCULT BLOOD, IMMUNOCHEMICAL: Fecal Occult Bld: POSITIVE — AB

## 2020-06-19 ENCOUNTER — Other Ambulatory Visit: Payer: Self-pay | Admitting: Family Medicine

## 2020-06-19 DIAGNOSIS — K921 Melena: Secondary | ICD-10-CM

## 2020-06-20 ENCOUNTER — Encounter: Payer: Self-pay | Admitting: Orthopaedic Surgery

## 2020-06-20 ENCOUNTER — Ambulatory Visit: Payer: Medicare HMO | Admitting: Orthopaedic Surgery

## 2020-06-20 ENCOUNTER — Other Ambulatory Visit: Payer: Self-pay

## 2020-06-20 DIAGNOSIS — G5602 Carpal tunnel syndrome, left upper limb: Secondary | ICD-10-CM

## 2020-06-20 NOTE — Progress Notes (Signed)
Office Visit Note   Patient: Peter Howe           Date of Birth: 07-07-1940           MRN: [id number] Visit Date: 06/20/2020              Requested by: Loman Brooklyn, [address] [place],  [place] [address] PCP: Loman Brooklyn, FNP   Assessment & Plan: Visit Diagnoses:  1. Carpal tunnel syndrome, left upper limb     Plan: Peter Howe continues to have symptoms of carpal tunnel on the left upper extremity with numbness and tingling.  I did order EMGs and nerve conduction studies by Dr. Ernestina Patches.  The results demonstrate a moderate left median nerve entrapment at the wrist affecting both sensory and motor components.  Peter Howe thought that surgical evaluation would be worthwhile.  I discussed different treatment options with Peter Howe and Peter Howe would like to proceed with the surgery.  I discussed the outpatient nature, anesthesia incision away may expect postoperatively.  We also discussed the potential limitations and possible incomplete pain relief.  Follow-Up Instructions: Return We will schedule left carpal tunnel release.   Orders:  No orders of the defined types were placed in this encounter.  No orders of the defined types were placed in this encounter.     Procedures: No procedures performed   Clinical Data: No additional findings.   Subjective: Chief Complaint  Patient presents with  . Left Hand - Follow-up    EMG review  Patient presents today for follow up on his left carpal tunnel. Peter Howe had an EMG study with Dr.Newton and is here today to review those results.   HPI  Review of Systems   Objective: Vital Signs: There were no vitals taken for this visit.  Physical Exam Constitutional:      Appearance: Peter Howe is well-developed.  Eyes:     Pupils: Pupils are equal, round, and reactive to light.  Pulmonary:     Effort: Pulmonary effort is normal.  Skin:    General: Skin is warm and dry.  Neurological:     Mental Status: Peter Howe is alert and oriented to  person, place, and time.  Psychiatric:        Behavior: Behavior normal.     Ortho Exam awake alert and oriented x3.  Comfortable sitting.  Positive Phalen's and Tinel's left wrist over the median nerve.  I thought Peter Howe had a little atrophy of the thenar musculature today but had good opposition of thumb the little finger.  Peter Howe had good capillary refill to all of his fingers. Specialty Comments:  No specialty comments available.  Imaging: No results found.   PMFS History: Patient Active Problem List   Diagnosis Date Noted  . Carpal tunnel syndrome, left upper limb 05/15/2020  . 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 Controlled substance agreement signed 05/19/2019  . Elevated liver enzymes 06/08/2017  . 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 Abdominal distension   . Abnormal EKG   . Gastroesophageal reflux disease 02/05/2016  . Chest pain 11/05/2015  . Abdominal pain 11/05/2015  . Essential hypertension with goal blood pressure less than 130/80 09/18/2013  . ATRIAL FIBRILLATION 01/02/2010  . BMI 33.0-33.9,adult 12/05/2008  . 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   Past Medical History:  Diagnosis Date  . Cancer ([place])   . Hypertension   . Overweight(278.02)    obesity  . Permanent atrial fibrillation ([place])   . Tachycardia-bradycardia [facility])    s/p  PPM    Family History  Problem Relation Age of Onset  . Cancer Mother 24  . CAD Father 59  . Heart disease Daughter   . Stroke Sister   . Cancer Sister   . Cancer Sister   . Diabetes Brother   . Cancer Brother   . Alcohol abuse Brother     Past Surgical History:  Procedure Laterality Date  . ARM SKIN LESION BIOPSY / EXCISION Left    Melanoma -removed by Dr. [place] Crane  . PACEMAKER INSERTION  03/28/03   St. Jude-PPM  . PPM GENERATOR CHANGEOUT N/A 11/24/2018   Procedure: PPM GENERATOR CHANGEOUT;  Surgeon: [name], MD;  Location: [place];  Service: Cardiovascular;  Laterality: N/A;   Social History   Occupational History  .  Occupation: runs a Engineer, technical sales    Comment: part time  Tobacco Use  . Smoking status: Former Smoker    Packs/day: 1.00    Years: 30.00    Pack years: 30.00    Types: Cigarettes    Start date: 03/30/1955    Quit date: 03/09/1993    Years since quitting: 27.3  . Smokeless tobacco: Never Used  Vaping Use  . Vaping Use: Never used  Substance and Sexual Activity  . Alcohol use: Yes    Alcohol/week: 7.0 standard drinks    Types: 1 Glasses of wine, 4 Cans of beer, 2 Shots of liquor per week  . Drug use: No  . Sexual activity: Not Currently

## 2020-06-24 ENCOUNTER — Telehealth: Payer: Self-pay | Admitting: *Deleted

## 2020-06-24 DIAGNOSIS — I700 Atherosclerosis of aorta: Secondary | ICD-10-CM | POA: Insufficient documentation

## 2020-06-24 NOTE — Telephone Encounter (Signed)
Clinical pharmacist to review Xarelto.  We will also forward to our device clinic since he has a pacemaker for tachybradycardia syndrome, note location of surgery is fairly distal in the extremities, may not need additional device manipulation during the surgery.

## 2020-06-24 NOTE — Telephone Encounter (Signed)
   [facility] Pre-operative Risk Assessment    Patient Name: DEONDRICK SEARLS  DOB: [birth year]  MRN: [id number]   HEARTCARE STAFF: - Please ensure there is not already an duplicate clearance open for this procedure. - Under Visit Info/Reason for Call, type in Other and utilize the format Clearance MM/DD/YY or Clearance TBD. Do not use dashes or single digits. - If request is for dental extraction, please clarify the # of teeth to be extracted.  Request for surgical clearance:  1. What type of surgery is being performed? LEFT CARPAL TUNNEL RELEASE    2. When is this surgery scheduled? 07/11/20   3. What type of clearance is required (medical clearance vs. Pharmacy clearance to hold med vs. Both)? BOTH  4. Are there any medications that need to be held prior to surgery and how long? [place]   5. Practice name and name of physician performing surgery? [facility]; DR. Collier [facility]   6. What is the office phone number? [phone]   7.   What is the office fax number? [phone]  8.   Anesthesia type (None, local, MAC, general) ? NOT LISTED   Julaine Hua 06/24/2020, 2:04 PM  _________________________________________________________________   (provider comments below)

## 2020-06-24 NOTE — Telephone Encounter (Signed)
Patient with diagnosis of afib on Xarelto for anticoagulation.    Procedure: left carpal tunnel release Date of procedure: 07/11/20  CHA2DS2-VASc Score = 4  This indicates a 4.8% annual risk of stroke. The patient's score is based upon: CHF History: No HTN History: Yes Diabetes History: No Stroke History: No Vascular Disease History: Yes (aortic atherosclerosis noted on 1/2/219 abdominal CT) Age Score: 2 Gender Score: 0   Aortic atherosclerosis noted on 03/30/17 abdominal CT. This is not noted on pt's PMH or problem list, I have added it.  CrCl 64mL/min Platelet count 175K  Per office protocol, patient can hold Xarelto for 1-2 days prior to procedure.

## 2020-06-26 NOTE — Telephone Encounter (Signed)
   Name: Peter Howe  DOB: 10/23/1940  MRN: [id number]   Primary Cardiologist: No primary care provider on file.  Chart reviewed as part of pre-operative protocol coverage. Patient was contacted 06/26/2020 in reference to pre-operative risk assessment for pending surgery as outlined below.  Peter Howe was last seen on 03/05/2020 by Tommye Standard PA-C.  Since that day, Peter Howe has done well without any exertional chest pain or dyspnea.  Therefore, based on ACC/AHA guidelines, the patient would be at acceptable risk for the planned procedure without further cardiovascular testing.   Patient may hold Xarelto for 2 days prior to the procedure and restart as soon as possible afterward at the surgeon's discretion.  The patient was advised that if he develops new symptoms prior to surgery to contact our office to arrange for a follow-up visit, and he verbalized understanding.  I will route this recommendation to the requesting party via Epic fax function and remove from pre-op pool. Please call with questions.  [place], [place] 06/26/2020, 5:31 PM

## 2020-07-11 ENCOUNTER — Other Ambulatory Visit: Payer: Self-pay | Admitting: Orthopedic Surgery

## 2020-07-11 DIAGNOSIS — G5602 Carpal tunnel syndrome, left upper limb: Secondary | ICD-10-CM | POA: Diagnosis not present

## 2020-07-11 MED ORDER — TRAMADOL HCL 50 MG PO TABS: 50.0000 mg | ORAL_TABLET | Freq: Three times a day (TID) | ORAL | 0 refills | Status: DC | PRN

## 2020-07-17 ENCOUNTER — Ambulatory Visit (INDEPENDENT_AMBULATORY_CARE_PROVIDER_SITE_OTHER): Payer: Medicare HMO | Admitting: Orthopaedic Surgery

## 2020-07-17 ENCOUNTER — Other Ambulatory Visit: Payer: Self-pay

## 2020-07-17 ENCOUNTER — Encounter: Payer: Self-pay | Admitting: Orthopaedic Surgery

## 2020-07-17 VITALS — Ht 64.0 in | Wt 192.0 lb

## 2020-07-17 DIAGNOSIS — G5602 Carpal tunnel syndrome, left upper limb: Secondary | ICD-10-CM

## 2020-07-17 NOTE — Progress Notes (Signed)
Office Visit Note   Patient: Peter Howe           Date of Birth: 1940-07-05           MRN: [id number] Visit Date: 07/17/2020              Requested by: Loman Brooklyn, [place],  [place] [address] PCP: Loman Brooklyn, FNP   Assessment & Plan: Visit Diagnoses:  1. Carpal tunnel syndrome, left upper limb     Plan: 6 days status post left carpal tunnel release and doing well.  Incision is healing without a problem.  We will plan to see him back in a week and remove the stitches.  A waterproof Band-Aid was applied.  Mr. Buda can already tell a difference in his discomfort with much better feeling in the digits.  Motor exam intact and specifically opposition of thumb the little finger.  We will wear the volar wrist splint and return in a week for removal of the stitches  Follow-Up Instructions: Return in about 1 week (around 07/24/2020).   Orders:  No orders of the defined types were placed in this encounter.  No orders of the defined types were placed in this encounter.     Procedures: No procedures performed   Clinical Data: No additional findings.   Subjective: Chief Complaint  Patient presents with  . Left Hand - Follow-up, Routine Post Op    Left carpal tunnel release 07/11/2020  Patient presents today for follow up on his left hand. He had carpal tunnel release on 07/11/2020. He is now 6 days out from surgery. He has some numbness in his fingers. He states that his pain is minimal. He is not taking anything for pain.  Definitely improved in terms of the feeling in his fingers but still with some residual tingling  HPI  Review of Systems   Objective: Vital Signs: Ht 5\' 4"  (1.626 m)   Wt 192 lb (87.1 kg)   BMI 32.96 kg/m   Physical Exam  Ortho Exam right carpal tunnel incision healing without problem.  No redness or drainage.  Has sensation to the radial 3 digits and has good strength with opposition of thumb the little finger.  Able to  make a full fist  Specialty Comments:  No specialty comments available.  Imaging: No results found.   PMFS History: Patient Active Problem List   Diagnosis Date Noted  . Aortic atherosclerosis ([place]) 06/24/2020  . Carpal tunnel syndrome, left upper limb 05/15/2020  . 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 Controlled substance agreement signed 05/19/2019  . Elevated liver enzymes 06/08/2017  . 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 Abdominal distension   . Abnormal EKG   . Gastroesophageal reflux disease 02/05/2016  . Chest pain 11/05/2015  . Abdominal pain 11/05/2015  . Essential hypertension with goal blood pressure less than 130/80 09/18/2013  . ATRIAL FIBRILLATION 01/02/2010  . BMI 33.0-33.9,adult 12/05/2008  . 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   Past Medical History:  Diagnosis Date  . Cancer ([place])   . Hypertension   . Overweight(278.02)    obesity  . Permanent atrial fibrillation ([place])   . 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 Cancer Mother 87  . CAD Father 41  . Heart disease Daughter   . Stroke Sister   . Cancer Sister   . Cancer Sister   . Diabetes Brother   .  Cancer Brother   . Alcohol abuse Brother     Past Surgical History:  Procedure Laterality Date  . ARM SKIN LESION BIOPSY / EXCISION Left    Melanoma -removed by Dr. Nevada Crane  . PACEMAKER INSERTION  03/28/03   St. Jude-PPM  . PPM GENERATOR CHANGEOUT N/A 11/24/2018   Procedure: PPM GENERATOR CHANGEOUT;  Surgeon: [name], MD;  Location: [place];  Service: Cardiovascular;  Laterality: N/A;   Social History   Occupational History  . Occupation: runs a Engineer, technical sales    Comment: part time  Tobacco Use  . Smoking status: Former Smoker    Packs/day: 1.00    Years: 30.00    Pack years: 30.00    Types: Cigarettes    Start date: 03/30/1955    Quit date: 03/09/1993    Years since quitting: 27.3  . Smokeless tobacco: Never Used  Vaping Use  .  Vaping Use: Never used  Substance and Sexual Activity  . Alcohol use: Yes    Alcohol/week: 7.0 standard drinks    Types: 1 Glasses of wine, 4 Cans of beer, 2 Shots of liquor per week  . Drug use: No  . Sexual activity: Not Currently

## 2020-07-23 ENCOUNTER — Encounter: Payer: Self-pay | Admitting: Orthopaedic Surgery

## 2020-07-23 ENCOUNTER — Ambulatory Visit (INDEPENDENT_AMBULATORY_CARE_PROVIDER_SITE_OTHER): Payer: Medicare HMO | Admitting: Orthopaedic Surgery

## 2020-07-23 ENCOUNTER — Other Ambulatory Visit: Payer: Self-pay

## 2020-07-23 VITALS — Ht 64.0 in | Wt 192.0 lb

## 2020-07-23 DIAGNOSIS — G5602 Carpal tunnel syndrome, left upper limb: Secondary | ICD-10-CM

## 2020-07-23 NOTE — Progress Notes (Signed)
Office Visit Note   Patient: Peter Howe           Date of Birth: [birth year]           MRN: [id number] Visit Date: 07/23/2020              Requested by: Loman Brooklyn, [place],  [place] [address] PCP: Loman Brooklyn, FNP   Assessment & Plan: Visit Diagnoses:  1. Carpal tunnel syndrome, left upper limb     Plan: Mr. Lappe is 12 days status post left carpal tunnel release and doing well.  No pain and notes he has much better feeling in his radial 3 digits.  I remove the stitches and applied Steri-Strips over benzoin and a waterproof Band-Aid.  He will continue with the splint and I will check him in 3 weeks  Follow-Up Instructions: Return in about 3 weeks (around 08/13/2020).   Orders:  No orders of the defined types were placed in this encounter.  No orders of the defined types were placed in this encounter.     Procedures: No procedures performed   Clinical Data: No additional findings.   Subjective: Chief Complaint  Patient presents with  . Left Hand - Follow-up    Left carpal tunnel release 07/11/2020  Patient presents today for a one week follow up on his left hand. He had carpal tunnel release on 07/11/2020. He is almost two weeks out from surgery. Patient states that he is doing well.   HPI  Review of Systems   Objective: Vital Signs: Ht 5\' 4"  (1.626 m)   Wt 192 lb (87.1 kg)   BMI 32.96 kg/m   Physical Exam  Ortho Exam left carpal tunnel incision healing without problem.  No evidence of infection.  Full range of motion of his fingers.  Good sensibility  Specialty Comments:  No specialty comments available.  Imaging: No results found.   PMFS History: Patient Active Problem List   Diagnosis Date Noted  . Aortic atherosclerosis ([place]) 06/24/2020  . Carpal tunnel syndrome, left upper limb 05/15/2020  . 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 Controlled substance agreement signed 05/19/2019  . Elevated liver enzymes 06/08/2017  .  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 Abdominal distension   . Abnormal EKG   . Gastroesophageal reflux disease 02/05/2016  . Chest pain 11/05/2015  . Abdominal pain 11/05/2015  . Essential hypertension with goal blood pressure less than 130/80 09/18/2013  . ATRIAL FIBRILLATION 01/02/2010  . BMI 33.0-33.9,adult 12/05/2008  . 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   Past Medical History:  Diagnosis Date  . Cancer ([place])   . Hypertension   . Overweight(278.02)    obesity  . Permanent atrial fibrillation ([place])   . 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 Cancer Mother 65  . CAD Father 1  . Heart disease Daughter   . Stroke Sister   . Cancer Sister   . Cancer Sister   . Diabetes Brother   . Cancer Brother   . Alcohol abuse Brother     Past Surgical History:  Procedure Laterality Date  . ARM SKIN LESION BIOPSY / EXCISION Left    Melanoma -removed by Dr. Nevada Crane  . PACEMAKER INSERTION  03/28/03   St. Jude-PPM  . PPM GENERATOR CHANGEOUT N/A 11/24/2018   Procedure: PPM GENERATOR CHANGEOUT;  Surgeon: [name], MD;  Location: [place] [facility];  Service: Cardiovascular;  Laterality: N/A;   Social History   Occupational History  . Occupation: runs a Engineer, technical sales    Comment: part time  Tobacco Use  . Smoking status: Former Smoker    Packs/day: 1.00    Years: 30.00    Pack years: 30.00    Types: Cigarettes    Start date: 03/30/1955    Quit date: 03/09/1993    Years since quitting: 27.3  . Smokeless tobacco: Never Used  Vaping Use  . Vaping Use: Never used  Substance and Sexual Activity  . Alcohol use: Yes    Alcohol/week: 7.0 standard drinks    Types: 1 Glasses of wine, 4 Cans of beer, 2 Shots of liquor per week  . Drug use: No  . Sexual activity: Not Currently

## 2020-07-30 ENCOUNTER — Telehealth: Payer: Self-pay

## 2020-07-30 ENCOUNTER — Ambulatory Visit: Payer: Medicare HMO | Admitting: Gastroenterology

## 2020-07-30 ENCOUNTER — Encounter: Payer: Self-pay | Admitting: Gastroenterology

## 2020-07-30 VITALS — BP 140/80 | HR 54 | Ht 64.0 in | Wt 193.0 lb

## 2020-07-30 DIAGNOSIS — Z7902 Long term (current) use of antithrombotics/antiplatelets: Secondary | ICD-10-CM | POA: Diagnosis not present

## 2020-07-30 DIAGNOSIS — K921 Melena: Secondary | ICD-10-CM

## 2020-07-30 DIAGNOSIS — R1013 Epigastric pain: Secondary | ICD-10-CM | POA: Diagnosis not present

## 2020-07-30 NOTE — Telephone Encounter (Signed)
[facility] Pre-operative Risk Assessment     Peter Howe Apr 22, 1940 [id number]  Procedure: EGD and Colonoscopy Anesthesia type:  MAC Procedure Date: 10/15/20 at 11am Provider: Dr. Tarri Glenn  Type of Clearance needed: Pharmacy  Medication(s) needing held: Xarelto   Length of time for medication to be held: 2 days  Please review request and advise by either responding to this message or by sending your response to the fax # provided below.  Thank you,  [facility]  Phone: [phone] Fax: [phone] ATTENTION: [name], LPN

## 2020-07-30 NOTE — Telephone Encounter (Signed)
Called pt and LVM requesting returned call. 

## 2020-07-30 NOTE — Progress Notes (Signed)
Records Request  Provider and/or Facility: [facility]  Document: Signed ROI  ROI has been faxed successfully to [facility]. Document(s) and fax confirmation(s) have been placed in the faxed file for future reference.

## 2020-07-30 NOTE — Telephone Encounter (Signed)
Patient with diagnosis of afib on Xarelto for anticoagulation.    Procedure: endoscopy and colonoscopy Date of procedure: 10/15/20  CHA2DS2-VASc Score = 4  }This indicates a 4.8% annual risk of stroke. The patient's score is based upon: CHF History: No HTN History: Yes Diabetes History: No Stroke History: No Vascular Disease History: Yes (aortic atherosclerosis noted on 1/2/219 abdominal CT) Age Score: 2 Gender Score: 0   CrCl 79mL/min Platelet count 175K  Per office protocol, patient can hold Xarelto for 2 days prior to procedure as requested.

## 2020-07-30 NOTE — Telephone Encounter (Signed)
Will route to PharmD for rec's re: holding anticoagulation. Richardson Dopp, PA-C    07/30/2020 12:41 PM

## 2020-07-30 NOTE — Patient Instructions (Addendum)
It was a pleasure to meet you today. Based on our discussion, I am providing you with my recommendations below:  RECOMMENDATION(S):   RECORDS:  . We have asked you to sign a Release of Information today to obtain records from [facility].  COLONOSCOPY AND ENDOSCOPY:   . You have been scheduled for an endoscopy and a colonoscopy. Please follow the written instructions given to you at your visit today.  PREP:   . Please pick up your prep supplies at the pharmacy within the next 1-3 days.  INHALERS:   . If you use inhalers (even only as needed), please bring them with you on the day of your procedure.  MEDICATIONS TO HOLD:  . We will contact your provider to request permission for you to hold XARELTO. Once we receive a response, you will be contacted by our office. If you do not hear from our office 1 week prior to your scheduled procedure, please contact our office at [phone].   COLONOSCOPY TIPS:  . To reduce nausea and dehydration, stay well hydrated for 3-4 days prior to the exam.  . To prevent skin/hemorrhoid irritation - prior to wiping, put A&Dointment or vaseline on the toilet paper. Marland Kitchen Keep a towel or pad on the bed.  Marland Kitchen BEFORE STARTING YOUR PREP, drink  64oz of clear liquids in the morning. This will help to flush the colon and will ensure you are well hydrated!!!!  NOTE - This is in addition to the fluids required for to complete your prep. . Use of a flavored hard candy, such as grape Anise Salvo, can counteract some of the flavor of the prep and may prevent some nausea.   BMI:  . If you are age 41 or older, your body mass index should be between 23-30. Your Body mass index is 33.13 kg/m. If this is out of the aforementioned range listed, please consider follow up with your Primary Care Provider.  Thank you for trusting me with your gastrointestinal care!    Thornton Park, MD, MPH

## 2020-07-30 NOTE — Progress Notes (Addendum)
Referring Provider: Loman Brooklyn, FNP Primary Care Physician:  Loman Brooklyn, FNP  Reason for Consultation: Blood in the stool   IMPRESSION:  Blood in the stool - FOBT + 06/11/20 without anemia or overt bleeding Recent nausea, heartburn, and diarrhea followed by constipation    - improved on PPI therapy Chronic use of Xarelto History of colon polyps    - tubular adenoma seen on pathology reports from 2000, 2003, and 2006 Possible family history of colon cancer (mother)  Given blood in the stool, recent onset of new symptoms, and history of colon polyps, I am recommending an EGD and colonoscopy after a Xarelto washout to evaluate for both upper and lower sources. Polyps and malignancy must be excluded.  I discussed with the patient that there is a low, but real, risk of a cardiovascular event such as heart attack, stroke, or embolism/thrombosis while off Xarelto. Will communicate by EMR with patient's prescribing provider to confirm that holding the Xarelto is appropriate at this time.     PLAN: Continue daily PPI Obtain prior colonoscopy and path results from [facility] EGD and Colonoscopy after a Xarelto washout  Please see the "Patient Instructions" section for addition details about the plan.  HPI: Peter Howe is a 80 y.o. male referred by NP Blanch Media for blood in the stool.  The history is obtained through the patient and review of his electronic health record. He has a history of hypertension, atrial fibrillation on Xarelto, and tachy-brady with PPM. EF of 60-65% on echo 2018.  Seen by his primary care provider for a telehealth visit in March for indigestion, nausea, and diarrhea that has been occurring intermittently for a month.  There was associated eructation and flatus.  Imodium was controlling his diarrhea but he preferred not to do that. Since starting Prilosec and Pepcid at that time he is feeling better, although now having some constipation.  He completed 30 days  of Pepcid and now remains on daily Prilsoec with control of his symptoms. There has been no overt bleeding or abdominal pain.   Labs 04/24/2020 showing normal comprehensive metabolic panel with a normal CBC except for an MCV of 98. Stool was fecal occult blood + 06/11/2020 A stool culture was ordered but does not appear to have been submitted  He has had multiple colonoscopy, he thinks the last was 5 years ago with Dr. Amedeo Plenty at [place]. He has a a history of tubular adenomas.  I see pathology results from 2000, 2003, and 2006 documenting tubular adenomas. The patient reports a prior endoscopy many years ago but it was long enough ago that he can't remember it.   Abdominal imaging with a CT of the abdomen and pelvis with contrast 03/30/2017 to evaluate intermittent epigastric pain and tenderness for 2 weeks showed no acute findings.  There was diverticulosis without diverticulitis and a small right inguinal hernia.   He thinks his mother probably had colon cancer at age 73, but, they did not know the specific diagnosis at that time. No other known family history of colon cancer or polyps. No family history of uterine/endometrial cancer, pancreatic cancer or gastric/stomach cancer.   Past Medical History:  Diagnosis Date  . Cancer ([place])   . Hypertension   . Overweight(278.02)    obesity  . Permanent atrial fibrillation ([place])   . 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 ARM SKIN LESION BIOPSY / EXCISION Left  Melanoma -removed by Dr. Nevada Crane  . PACEMAKER INSERTION  03/28/03   St. Jude-PPM  . PPM GENERATOR CHANGEOUT N/A 11/24/2018   Procedure: PPM GENERATOR CHANGEOUT;  Surgeon: [name], MD;  Location: [place] [facility];  Service: Cardiovascular;  Laterality: N/A;    Current Outpatient Medications  Medication Sig Dispense Refill  . acetaminophen (TYLENOL) 325 MG tablet Take 650 mg by mouth every 6 (six) hours as needed for mild  pain.    Marland Kitchen atenolol (TENORMIN) 100 MG tablet Take 1 tablet (100 mg total) by mouth daily. 90 tablet 3  . colchicine 0.6 MG tablet Take 0.6 mg by mouth 2 (two) times daily as needed (gout).     Marland Kitchen losartan (COZAAR) 100 MG tablet Take 0.5 tablets (50 mg total) by mouth daily. 90 tablet 3  . Multiple Vitamin (MULTIVITAMIN WITH MINERALS) TABS tablet Take 1 tablet by mouth daily.    . nitroGLYCERIN (NITROSTAT) 0.4 MG SL tablet Place 1 tablet (0.4 mg total) under the tongue every 5 (five) minutes as needed for chest pain. 25 tablet 3  . omeprazole (PRILOSEC) 20 MG capsule Take 1 capsule (20 mg total) by mouth daily. 30 capsule 3  . traMADol (ULTRAM) 50 MG tablet Take 1 tablet (50 mg total) by mouth every 8 (eight) hours as needed for moderate pain. 30 tablet 0  . XARELTO 20 MG TABS tablet Take 1 tablet (20 mg total) by mouth daily with supper. 30 tablet 5  . zolpidem (AMBIEN) 5 MG tablet Take 1 tablet (5 mg total) by mouth at bedtime. 30 tablet 5   No current facility-administered medications for this visit.    Allergies as of 07/30/2020 - Review Complete 07/30/2020  Allergen Reaction Noted  . Penicillins Rash   . Sulfonamide derivatives Rash 11/08/2009    Family History  Problem Relation Age of Onset  . Cancer Mother 19  . CAD Father 20  . Heart disease Daughter   . Stroke Sister   . Cancer Sister   . Cancer Sister   . Diabetes Brother   . Cancer Brother   . Alcohol abuse Brother      Review of Systems: 12 system ROS is negative except as noted above.   Physical Exam: General:   Alert,  well-nourished, pleasant and cooperative in NAD Head:  Normocephalic and atraumatic. Eyes:  Sclera clear, no icterus.   Conjunctiva pink. Ears:  Normal auditory acuity. Nose:  No deformity, discharge,  or lesions. Mouth:  No deformity or lesions.   Neck:  Supple; no masses or thyromegaly. Lungs:  Clear throughout to auscultation.   No wheezes. Heart:  Irregularly irregula; no murmurs. Abdomen:   Soft, central obesity, nontender, nondistended, normal bowel sounds, no rebound or guarding. No hepatosplenomegaly.   Rectal:  Deferred  Msk:  Symmetrical. No boney deformities LAD: No inguinal or umbilical LAD Extremities:  No clubbing or edema. Neurologic:  Alert and  oriented x4;  grossly nonfocal Skin:  Intact without significant lesions or rashes. Psych:  Alert and cooperative. Normal mood and affect.     Cejay Cambre L. Tarri Glenn, MD, MPH 07/30/2020, 8:54 AM

## 2020-07-31 ENCOUNTER — Encounter: Payer: Self-pay | Admitting: Gastroenterology

## 2020-07-31 NOTE — Telephone Encounter (Signed)
Letter by Thornton Park, MD on 07/31/2020    [facility] [place], [place]  [address] Phone:  [phone]   Fax:  [phone]   MRN: [id number] [address] [place] [address]   Date: 07/31/2020  Dear Mr. Authement,  We have been unable to reach you by telephone regarding holding your Xarelto prior to your Colonoscopy and Endoscopy on October 15, 2020. Your cardiologist has provided permission for you hold your Xarelto 2 days prior to your procedure. Your last dose will be on August 7th and you will NOT take your Xarelto on August 8th and August 9th. Dr. Tarri Glenn will advise you when it is safe for you to resume your Xarelto following your procedure. Please call our office if you have any questions or concerns about this information.    Sincerely,  [facility] Team   [facility]                       Ambrose Pancoast, [phone]                              1

## 2020-07-31 NOTE — Telephone Encounter (Signed)
SECOND ATTEMPT:  Called pt and again LVM requesting returned call.

## 2020-08-06 ENCOUNTER — Ambulatory Visit (INDEPENDENT_AMBULATORY_CARE_PROVIDER_SITE_OTHER): Payer: Medicare HMO

## 2020-08-06 DIAGNOSIS — I495 Sick sinus syndrome: Secondary | ICD-10-CM | POA: Diagnosis not present

## 2020-08-07 LAB — CUP PACEART REMOTE DEVICE CHECK
Battery Remaining Longevity: 137 mo
Battery Remaining Percentage: 95.5 %
Battery Voltage: 3.02 V
Brady Statistic RV Percent Paced: 46 %
Date Time Interrogation Session: 20220531151522
Implantable Lead Implant Date: 19980120
Implantable Lead Implant Date: 20050119
Implantable Lead Location: 753859
Implantable Lead Location: 753860
Implantable Pulse Generator Implant Date: 20200917
Lead Channel Impedance Value: 700 Ohm
Lead Channel Pacing Threshold Amplitude: 0.5 V
Lead Channel Pacing Threshold Pulse Width: 0.4 ms
Lead Channel Sensing Intrinsic Amplitude: 12 mV
Lead Channel Setting Pacing Amplitude: 2.5 V
Lead Channel Setting Pacing Pulse Width: 0.4 ms
Lead Channel Setting Sensing Sensitivity: 2 mV
Pulse Gen Model: 2272
Pulse Gen Serial Number: 9154140

## 2020-08-12 ENCOUNTER — Other Ambulatory Visit: Payer: Self-pay | Admitting: Family Medicine

## 2020-08-12 DIAGNOSIS — I10 Essential (primary) hypertension: Secondary | ICD-10-CM

## 2020-08-13 ENCOUNTER — Other Ambulatory Visit: Payer: Self-pay | Admitting: Family Medicine

## 2020-08-13 DIAGNOSIS — I10 Essential (primary) hypertension: Secondary | ICD-10-CM

## 2020-08-14 ENCOUNTER — Other Ambulatory Visit: Payer: Self-pay

## 2020-08-14 ENCOUNTER — Encounter: Payer: Self-pay | Admitting: Orthopaedic Surgery

## 2020-08-14 ENCOUNTER — Ambulatory Visit (INDEPENDENT_AMBULATORY_CARE_PROVIDER_SITE_OTHER): Payer: Medicare HMO | Admitting: Orthopaedic Surgery

## 2020-08-14 DIAGNOSIS — G5602 Carpal tunnel syndrome, left upper limb: Secondary | ICD-10-CM

## 2020-08-14 NOTE — Progress Notes (Signed)
Office Visit Note   Patient: Peter Howe           Date of Birth: September 08, 1940           MRN: [id number] Visit Date: 08/14/2020              Requested by: Loman Brooklyn, [place] [place],  [place] [address] PCP: Loman Brooklyn, FNP   Assessment & Plan: Visit Diagnoses:  1. Carpal tunnel syndrome, left upper limb     Plan: Mr. Mariana Arn is about 5 weeks status post left carpal tunnel release and very happy with the results.  He no longer has any numbness or tingling or pain.  He is playing golf does not take any pain medicine.  He had full range of motion of his fingers and good opposition of thumb the little finger.  Still has a little thickening of the scar but he can use vitamin E E or aloe vera we will plan to see him back as needed  Follow-Up Instructions: Return if symptoms worsen or fail to improve.   Orders:  No orders of the defined types were placed in this encounter.  No orders of the defined types were placed in this encounter.     Procedures: No procedures performed   Clinical Data: No additional findings.   Subjective: No chief complaint on file.  5-week status post left carpal tunnel release and very happy.  Does not have any the symptoms he had preoperatively and, specifically, pain numbness and tingling in the radial 3 digits.  No use of any pain medicine and no activity limitations HPI  Review of Systems   Objective: Vital Signs: There were no vitals taken for this visit.  Physical Exam  Ortho Exam left hand carpal tunnel incision healing without problem other than there is some thickened scar around the incision its not tender.  He has full range of motion of his digits and good opposition of thumb the little finger.  Good sensation to all of the digits.  Specialty Comments:  No specialty comments available.  Imaging: No results found.   PMFS History: Patient Active Problem List   Diagnosis Date Noted  . Aortic  atherosclerosis ([place]) 06/24/2020  . Carpal tunnel syndrome, left upper limb 05/15/2020  . 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 Controlled substance agreement signed 05/19/2019  . Elevated liver enzymes 06/08/2017  . 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 Abdominal distension   . Abnormal EKG   . Gastroesophageal reflux disease 02/05/2016  . Chest pain 11/05/2015  . Abdominal pain 11/05/2015  . Essential hypertension with goal blood pressure less than 130/80 09/18/2013  . ATRIAL FIBRILLATION 01/02/2010  . BMI 33.0-33.9,adult 12/05/2008  . 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   Past Medical History:  Diagnosis Date  . Cancer ([place])   . Hypertension   . Overweight(278.02)    obesity  . Permanent atrial fibrillation ([place])   . 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 Cancer Mother 34  . CAD Father 72  . Heart disease Daughter   . Stroke Sister   . Cancer Sister   . Cancer Sister   . Diabetes Brother   . Cancer Brother   . Alcohol abuse Brother     Past Surgical History:  Procedure Laterality Date  . ARM SKIN LESION BIOPSY / EXCISION Left    Melanoma -removed by Dr. Nevada Crane  . PACEMAKER INSERTION  03/28/03   St. Jude-PPM  . PPM GENERATOR CHANGEOUT N/A 11/24/2018   Procedure: PPM GENERATOR CHANGEOUT;  Surgeon: [name], MD;  Location: [facility] [facility];  Service: Cardiovascular;  Laterality: N/A;   Social History   Occupational History  . Occupation: runs a Engineer, technical sales    Comment: part time  Tobacco Use  . Smoking status: Former Smoker    Packs/day: 1.00    Years: 30.00    Pack years: 30.00    Types: Cigarettes    Start date: 03/30/1955    Quit date: 03/09/1993    Years since quitting: 27.4  . Smokeless tobacco: Never Used  Vaping Use  . Vaping Use: Never used  Substance and Sexual Activity  . Alcohol use: Yes    Alcohol/week: 7.0 standard drinks    Types: 1 Glasses of wine, 4 Cans of beer, 2 Shots of liquor per week   . Drug use: No  . Sexual activity: Not Currently     Garald Balding, MD   Note - This record has been created using [employer].  Chart creation errors have been sought, but may not always  have been located. Such creation errors do not reflect on  the standard of medical care.

## 2020-08-15 ENCOUNTER — Telehealth: Payer: Self-pay | Admitting: Gastroenterology

## 2020-08-15 NOTE — Telephone Encounter (Signed)
[facility] received 35 pages of medical records from [place] - sending interoffice mail to [facility] 08/15/20  KLM

## 2020-08-19 NOTE — Progress Notes (Signed)
Records received from [place] and placed on Dr. Tarri Glenn desk for review.

## 2020-08-20 ENCOUNTER — Telehealth: Payer: Self-pay | Admitting: Family Medicine

## 2020-08-20 MED ORDER — COLCHICINE 0.6 MG PO TABS: 0.6000 mg | ORAL_TABLET | Freq: Two times a day (BID) | ORAL | 3 refills | Status: DC | PRN

## 2020-08-20 NOTE — Telephone Encounter (Signed)
  Prescription Request  08/20/2020  What is the name of the medication or equipment? colchicine 0.6 MG tablet   Have you contacted your pharmacy to request a refill? (if applicable) YES but prescription is expired  Which pharmacy would you like this sent to? [facility]   Patient notified that their request is being sent to the clinical staff for review and that they should receive a response within 2 business days.

## 2020-08-20 NOTE — Telephone Encounter (Signed)
Med sent.

## 2020-08-22 ENCOUNTER — Telehealth: Payer: Self-pay | Admitting: Gastroenterology

## 2020-08-22 NOTE — Telephone Encounter (Signed)
Review of records from [facility].  Last seen by Dr. Amedeo Plenty in 2018.  Previously evaluated for indigestion, nausea, abdominal pain.  Treated with omeprazole.  Had CT documented diverticulitis 03/2017 with treated with Cipro and Flagyl.  Normal EGD January 2018 except for gastric biopsies showing chronic nonactive gastritis.         Colonoscopy ]06/2014 showed a benign lymphoid aggregate.  No surveillance recommended given his advanced age.

## 2020-08-28 NOTE — Progress Notes (Signed)
Remote pacemaker transmission.   

## 2020-09-13 DIAGNOSIS — J3489 Other specified disorders of nose and nasal sinuses: Secondary | ICD-10-CM | POA: Diagnosis not present

## 2020-09-18 ENCOUNTER — Other Ambulatory Visit: Payer: Self-pay | Admitting: Internal Medicine

## 2020-09-19 NOTE — Telephone Encounter (Signed)
Prescription refill request for Xarelto received.  Indication: Atrial Fib Last office visit: 03/05/20  Jens Som PA-C Weight: 85.3kg Age: 80 Scr: 1.15 on 04/24/20 CrCl: 61.81  Based on above findings Xarelto 20mg  daily is the appropriate dose.  Refill approved.

## 2020-09-21 DIAGNOSIS — J0100 Acute maxillary sinusitis, unspecified: Secondary | ICD-10-CM | POA: Diagnosis not present

## 2020-09-28 ENCOUNTER — Observation Stay (HOSPITAL_COMMUNITY)
Admission: EM | Admit: 2020-09-28 | Discharge: 2020-09-29 | Disposition: A | Discharge status: Home / Self Care | Payer: Medicare HMO | Attending: Internal Medicine | Admitting: Internal Medicine

## 2020-09-28 ENCOUNTER — Encounter (HOSPITAL_COMMUNITY): Payer: Self-pay

## 2020-09-28 ENCOUNTER — Other Ambulatory Visit: Payer: Self-pay

## 2020-09-28 ENCOUNTER — Emergency Department (HOSPITAL_COMMUNITY): Payer: Medicare HMO

## 2020-09-28 DIAGNOSIS — I10 Essential (primary) hypertension: Secondary | ICD-10-CM | POA: Diagnosis not present

## 2020-09-28 DIAGNOSIS — R079 Chest pain, unspecified: Principal | ICD-10-CM | POA: Insufficient documentation

## 2020-09-28 DIAGNOSIS — Z79899 Other long term (current) drug therapy: Secondary | ICD-10-CM | POA: Insufficient documentation

## 2020-09-28 DIAGNOSIS — R0789 Other chest pain: Secondary | ICD-10-CM | POA: Diagnosis not present

## 2020-09-28 DIAGNOSIS — Z7901 Long term (current) use of anticoagulants: Secondary | ICD-10-CM | POA: Diagnosis not present

## 2020-09-28 DIAGNOSIS — Z20822 Contact with and (suspected) exposure to covid-19: Secondary | ICD-10-CM | POA: Insufficient documentation

## 2020-09-28 DIAGNOSIS — K219 Gastro-esophageal reflux disease without esophagitis: Secondary | ICD-10-CM | POA: Diagnosis present

## 2020-09-28 DIAGNOSIS — R0602 Shortness of breath: Secondary | ICD-10-CM | POA: Diagnosis not present

## 2020-09-28 DIAGNOSIS — I491 Atrial premature depolarization: Secondary | ICD-10-CM | POA: Diagnosis not present

## 2020-09-28 DIAGNOSIS — E860 Dehydration: Secondary | ICD-10-CM | POA: Diagnosis not present

## 2020-09-28 DIAGNOSIS — Z87891 Personal history of nicotine dependence: Secondary | ICD-10-CM | POA: Insufficient documentation

## 2020-09-28 DIAGNOSIS — Z743 Need for continuous supervision: Secondary | ICD-10-CM | POA: Diagnosis not present

## 2020-09-28 DIAGNOSIS — I4891 Unspecified atrial fibrillation: Secondary | ICD-10-CM | POA: Diagnosis present

## 2020-09-28 DIAGNOSIS — F5101 Primary insomnia: Secondary | ICD-10-CM | POA: Diagnosis present

## 2020-09-28 DIAGNOSIS — Z950 Presence of cardiac pacemaker: Secondary | ICD-10-CM | POA: Insufficient documentation

## 2020-09-28 LAB — COMPREHENSIVE METABOLIC PANEL
ALT: 28 U/L (ref 0–44)
AST: 30 U/L (ref 15–41)
Albumin: 4.2 g/dL (ref 3.5–5.0)
Alkaline Phosphatase: 68 U/L (ref 38–126)
Anion gap: 8 (ref 5–15)
BUN: 16 mg/dL (ref 8–23)
CO2: 24 mmol/L (ref 22–32)
Calcium: 9 mg/dL (ref 8.9–10.3)
Chloride: 101 mmol/L (ref 98–111)
Creatinine, Ser: 1 mg/dL (ref 0.61–1.24)
GFR, Estimated: >60 mL/min (ref >60)
Glucose, Bld: 112 mg/dL — ABNORMAL HIGH (ref 70–99)
Potassium: 4.2 mmol/L (ref 3.5–5.1)
Sodium: 133 mmol/L — ABNORMAL LOW (ref 135–145)
Total Bilirubin: 0.8 mg/dL (ref 0.3–1.2)
Total Protein: 7.2 g/dL (ref 6.5–8.1)

## 2020-09-28 LAB — CBC WITH DIFFERENTIAL/PLATELET
Abs Immature Granulocytes: 0.03 10*3/uL (ref 0.00–0.07)
Basophils Absolute: 0.1 10*3/uL (ref 0.0–0.1)
Basophils Relative: 1 %
Eosinophils Absolute: 0.1 10*3/uL (ref 0.0–0.5)
Eosinophils Relative: 1 %
HCT: 46.8 % (ref 39.0–52.0)
Hemoglobin: 16.1 g/dL (ref 13.0–17.0)
Immature Granulocytes: 0 %
Lymphocytes Relative: 11 %
Lymphs Abs: 1 10*3/uL (ref 0.7–4.0)
MCH: 33.3 pg (ref 26.0–34.0)
MCHC: 34.4 g/dL (ref 30.0–36.0)
MCV: 96.9 fL (ref 80.0–100.0)
Monocytes Absolute: 0.5 10*3/uL (ref 0.1–1.0)
Monocytes Relative: 6 %
Neutro Abs: 7.4 10*3/uL (ref 1.7–7.7)
Neutrophils Relative %: 81 %
Platelets: 216 10*3/uL (ref 150–400)
RBC: 4.83 MIL/uL (ref 4.22–5.81)
RDW: 12.3 % (ref 11.5–15.5)
WBC: 9 10*3/uL (ref 4.0–10.5)
nRBC: 0 % (ref 0.0–0.2)

## 2020-09-28 LAB — TROPONIN I (HIGH SENSITIVITY): Troponin I (High Sensitivity): 4 ng/L (ref <18)

## 2020-09-28 MED ORDER — SODIUM CHLORIDE 0.9 % IV BOLUS
1000.0000 mL | Freq: Once | INTRAVENOUS | Status: AC
  Administered 2020-09-28: 1000 mL via INTRAVENOUS

## 2020-09-28 MED ORDER — NITROGLYCERIN 0.4 MG SL SUBL
0.4000 mg | SUBLINGUAL_TABLET | Freq: Once | SUBLINGUAL | Status: AC
  Administered 2020-09-28: 0.4 mg via SUBLINGUAL
  Filled 2020-09-28: qty 1

## 2020-09-28 MED ORDER — MORPHINE SULFATE (PF) 2 MG/ML IV SOLN
2.0000 mg | Freq: Once | INTRAVENOUS | Status: AC
  Administered 2020-09-29: 2 mg via INTRAVENOUS
  Filled 2020-09-28: qty 1

## 2020-09-28 NOTE — ED Provider Notes (Signed)
[facility] EMERGENCY DEPARTMENT Provider Note   CSN: [id number] Arrival date & time: 09/28/20  2112     History Chief Complaint  Patient presents with   Chest Pain    Peter Howe is a 80 y.o. male.  Patient states that he started having chest discomfort around 3 PM today.  Patient states he was playing golf today and shortly afterwards he started having chest discomfort.  He felt like he was dehydrated.  Patient has a history of atrial fib and is on Xarelto he also has hypertension  The history is provided by the patient and medical records.  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    Patient Active Problem List   Diagnosis Date Noted   Aortic atherosclerosis ([place]) 06/24/2020   Carpal tunnel syndrome, left upper limb 05/15/2020   Primary insomnia 05/19/2019   Controlled substance agreement signed 05/19/2019   Elevated liver enzymes 06/08/2017   Pacemaker 06/04/2017   Abdominal distension    Abnormal EKG    Gastroesophageal reflux disease 02/05/2016   Chest pain 11/05/2015   Abdominal pain 11/05/2015   Essential hypertension with goal blood pressure less than 130/80 09/18/2013   ATRIAL FIBRILLATION 01/02/2010   BMI 33.0-33.9,adult 12/05/2008   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 Nevada Crane   PACEMAKER INSERTION   03/28/03   St. Jude-PPM   PPM GENERATOR CHANGEOUT N/A 11/24/2018   Procedure: PPM GENERATOR CHANGEOUT;  Surgeon: [name], MD;  Location: [facility];  Service: Cardiovascular;  Laterality: N/A;       Family History  Problem Relation Age of Onset   Cancer Mother 81   CAD Father 69   Heart disease Daughter    Stroke Sister    Cancer Sister    Cancer Sister    Diabetes Brother    Cancer Brother    Alcohol abuse Brother     Social History   Tobacco Use   Smoking status: Former    Packs/day: 1.00    Years: 30.00    Pack years: 30.00    Types: Cigarettes    Start date: 03/30/1955    Quit date: 03/09/1993    Years since quitting: 27.5   Smokeless tobacco: Never  Vaping Use   Vaping Use: Never used  Substance Use Topics   Alcohol use: Yes    Alcohol/week: 7.0 standard drinks    Types: 1 Glasses of wine, 4 Cans of beer, 2 Shots of liquor per week   Drug use: No    Home Medications Prior to Admission medications   Medication Sig Start Date End Date Taking? Authorizing Provider  acetaminophen (TYLENOL) 325 MG tablet Take 650 mg by mouth every 6 (six) hours as needed for mild pain.  [provider]  atenolol (TENORMIN) 100 MG tablet Take 1 tablet (100 mg total) by mouth daily. 04/15/20   Allred, Jeneen Rinks, MD  colchicine 0.6 MG tablet Take 1 tablet (0.6 mg total) by mouth 2 (two) times daily as needed (gout). 08/20/20   Loman Brooklyn, FNP  losartan (COZAAR) 100 MG tablet Take 0.5 tablets (50 mg total) by mouth daily. 08/14/20   Loman Brooklyn, FNP  Multiple Vitamin (MULTIVITAMIN WITH MINERALS) TABS tablet Take 1 tablet by mouth daily.    [provider]  nitroGLYCERIN (NITROSTAT) 0.4 MG SL tablet Place 1 tablet (0.4 mg total) under the tongue every 5 (five) minutes as needed for chest pain. 03/26/15   Herminio Commons, MD  omeprazole (PRILOSEC) 20 MG capsule Take 1 capsule (20 mg total) by mouth daily. 06/06/20   Gwenlyn Perking, FNP  rivaroxaban  (XARELTO) 20 MG TABS tablet Take 1 tablet (20 mg total) by mouth daily with supper. 09/19/20   Allred, Jeneen Rinks, MD  traMADol (ULTRAM) 50 MG tablet Take 1 tablet (50 mg total) by mouth every 8 (eight) hours as needed for moderate pain. 07/11/20   Cherylann Ratel, PA-C  zolpidem (AMBIEN) 5 MG tablet Take 1 tablet (5 mg total) by mouth at bedtime. 04/24/20   Loman Brooklyn, FNP    Allergies    Penicillins and Sulfonamide derivatives  Review of Systems   Review of Systems  Constitutional:  Negative for appetite change and fatigue.  HENT:  Negative for congestion, ear discharge and sinus pressure.   Eyes:  Negative for discharge.  Respiratory:  Negative for cough.   Cardiovascular:  Positive for chest pain.  Gastrointestinal:  Negative for abdominal pain and diarrhea.  Genitourinary:  Negative for frequency and hematuria.  Musculoskeletal:  Negative for back pain.  Skin:  Negative for rash.  Neurological:  Negative for seizures and headaches.  Psychiatric/Behavioral:  Negative for hallucinations.    Physical Exam Updated Vital Signs BP (!) 149/86   Pulse 71   Temp 97.7 F (36.5 C) (Oral)   Resp 18   Ht '5\' 4"'$  (1.626 m)   Wt 83.9 kg   SpO2 99%   BMI 31.76 kg/m   Physical Exam Vitals and nursing note reviewed.  Constitutional:      Appearance: He is well-developed.  HENT:     Head: Normocephalic.     Nose: Nose normal.  Eyes:     General: No scleral icterus.    Conjunctiva/sclera: Conjunctivae normal.  Neck:     Thyroid: No thyromegaly.  Cardiovascular:     Rate and Rhythm: Normal rate and regular rhythm.     Heart sounds: No murmur heard.   No friction rub. No gallop.  Pulmonary:     Breath sounds: No stridor. No wheezing or rales.  Chest:     Chest wall: No tenderness.  Abdominal:     General: There is no distension.     Tenderness: There is no abdominal tenderness. There is no rebound.  Musculoskeletal:        General: Normal range of motion.     Cervical back:  Neck supple.  Lymphadenopathy:     Cervical: No cervical adenopathy.  Skin:    Findings: No erythema or rash.  Neurological:     Mental Status: He is alert and oriented to person, place, and time.     Motor: No abnormal muscle tone.     Coordination: Coordination normal.  Psychiatric:  Behavior: Behavior normal.    ED Results / Procedures / Treatments   Labs (all labs ordered are listed, but only abnormal results are displayed) Labs Reviewed  COMPREHENSIVE METABOLIC PANEL - Abnormal; Notable for the following components:      Result Value   Sodium 133 (*)    Glucose, Bld 112 (*)    All other components within normal limits  CBC WITH DIFFERENTIAL/PLATELET  TROPONIN I (HIGH SENSITIVITY)  TROPONIN I (HIGH SENSITIVITY)    EKG None  Radiology DG Chest Port 1 View  Result Date: 09/28/2020 CLINICAL DATA:  Chest tightness and shortness of breath beginning 2 hours ago. EXAM: PORTABLE CHEST 1 VIEW COMPARISON:  03/26/2016 FINDINGS: Cardiac silhouette mildly enlarged. Right anterior chest wall pacemaker is stable. No mediastinal or hilar masses. Clear lungs.  No pleural effusion or pneumothorax. Skeletal structures are grossly intact. IMPRESSION: No acute cardiopulmonary disease. Electronically Signed   By: Lajean Manes M.D.   On: 09/28/2020 22:12    Procedures Procedures   Medications Ordered in ED Medications  morphine 2 MG/ML injection 2 mg (has no administration in time range)  sodium chloride 0.9 % bolus 1,000 mL (1,000 mLs Intravenous New Bag/Given 09/28/20 2227)  nitroGLYCERIN (NITROSTAT) SL tablet 0.4 mg (0.4 mg Sublingual Given 09/28/20 2226)    ED Course  I have reviewed the triage vital signs and the nursing notes.  Pertinent labs & imaging results that were available during my care of the patient were reviewed by me and considered in my medical decision making (see chart for details). Patient with chest discomfort that was helped by 1 nitro after about 30  minutes.  Labs and first troponin unremarkable.  EKG shows some inverted T waves.  He will be admitted to medicine for chest pain   MDM Rules/Calculators/A&P                           Patient with chest discomfort that is relieved by nitro.  He will be admitted to medicine Final Clinical Impression(s) / ED Diagnoses Final diagnoses:  Nonspecific chest pain    Rx / DC Orders ED Discharge Orders     None        Milton Ferguson, MD 09/28/20 2318

## 2020-09-29 ENCOUNTER — Observation Stay (HOSPITAL_BASED_OUTPATIENT_CLINIC_OR_DEPARTMENT_OTHER): Payer: Medicare HMO

## 2020-09-29 DIAGNOSIS — R079 Chest pain, unspecified: Secondary | ICD-10-CM

## 2020-09-29 DIAGNOSIS — Z950 Presence of cardiac pacemaker: Secondary | ICD-10-CM | POA: Diagnosis not present

## 2020-09-29 DIAGNOSIS — I4821 Permanent atrial fibrillation: Secondary | ICD-10-CM

## 2020-09-29 DIAGNOSIS — K219 Gastro-esophageal reflux disease without esophagitis: Secondary | ICD-10-CM | POA: Diagnosis not present

## 2020-09-29 DIAGNOSIS — R69 Illness, unspecified: Secondary | ICD-10-CM | POA: Diagnosis not present

## 2020-09-29 DIAGNOSIS — F5101 Primary insomnia: Secondary | ICD-10-CM

## 2020-09-29 DIAGNOSIS — I10 Essential (primary) hypertension: Secondary | ICD-10-CM

## 2020-09-29 DIAGNOSIS — Z7901 Long term (current) use of anticoagulants: Secondary | ICD-10-CM | POA: Diagnosis not present

## 2020-09-29 DIAGNOSIS — I4891 Unspecified atrial fibrillation: Secondary | ICD-10-CM | POA: Diagnosis not present

## 2020-09-29 DIAGNOSIS — Z20822 Contact with and (suspected) exposure to covid-19: Secondary | ICD-10-CM | POA: Diagnosis not present

## 2020-09-29 DIAGNOSIS — Z87891 Personal history of nicotine dependence: Secondary | ICD-10-CM | POA: Diagnosis not present

## 2020-09-29 DIAGNOSIS — Z79899 Other long term (current) drug therapy: Secondary | ICD-10-CM | POA: Diagnosis not present

## 2020-09-29 LAB — LIPID PANEL
Cholesterol: 159 mg/dL (ref 0–200)
HDL: 67 mg/dL (ref >40)
LDL Cholesterol: 82 mg/dL (ref 0–99)
Total CHOL/HDL Ratio: 2.4 RATIO
Triglycerides: 50 mg/dL (ref <150)
VLDL: 10 mg/dL (ref 0–40)

## 2020-09-29 LAB — ECHOCARDIOGRAM COMPLETE
AR max vel: 2.15 cm2
AV Area VTI: 2.19 cm2
AV Area mean vel: 1.9 cm2
AV Mean grad: 4 mmHg
AV Peak grad: 5.6 mmHg
Ao pk vel: 1.18 m/s
Area-P 1/2: 4.39 cm2
Height: 64 in
MV VTI: 2.58 cm2
S' Lateral: 2.33 cm
Weight: 2960 oz

## 2020-09-29 LAB — SARS CORONAVIRUS 2 (TAT 6-24 HRS): SARS Coronavirus 2: NEGATIVE

## 2020-09-29 LAB — TROPONIN I (HIGH SENSITIVITY): Troponin I (High Sensitivity): 6 ng/L (ref <18)

## 2020-09-29 MED ORDER — LOSARTAN POTASSIUM 50 MG PO TABS
50.0000 mg | ORAL_TABLET | Freq: Every day | ORAL | Status: DC
  Administered 2020-09-29: 50 mg via ORAL
  Filled 2020-09-29: qty 1

## 2020-09-29 MED ORDER — ZOLPIDEM TARTRATE 5 MG PO TABS: 5.0000 mg | ORAL_TABLET | Freq: Every day | ORAL | Status: DC

## 2020-09-29 MED ORDER — NITROGLYCERIN 0.4 MG SL SUBL: 0.4000 mg | SUBLINGUAL_TABLET | SUBLINGUAL | Status: DC | PRN

## 2020-09-29 MED ORDER — RIVAROXABAN 20 MG PO TABS
20.0000 mg | ORAL_TABLET | Freq: Every day | ORAL | Status: DC
  Administered 2020-09-29: 20 mg via ORAL
  Filled 2020-09-29: qty 1

## 2020-09-29 MED ORDER — ONDANSETRON HCL 4 MG/2ML IJ SOLN: 4.0000 mg | Freq: Four times a day (QID) | INTRAMUSCULAR | Status: DC | PRN

## 2020-09-29 MED ORDER — ATENOLOL 25 MG PO TABS
100.0000 mg | ORAL_TABLET | Freq: Every day | ORAL | Status: DC
  Administered 2020-09-29: 100 mg via ORAL
  Filled 2020-09-29: qty 4

## 2020-09-29 MED ORDER — PANTOPRAZOLE SODIUM 40 MG PO TBEC: 40.0000 mg | DELAYED_RELEASE_TABLET | Freq: Two times a day (BID) | ORAL | 2 refills | Status: DC

## 2020-09-29 MED ORDER — ACETAMINOPHEN 325 MG PO TABS: 650.0000 mg | ORAL_TABLET | ORAL | Status: DC | PRN

## 2020-09-29 MED ORDER — PANTOPRAZOLE SODIUM 40 MG PO TBEC
40.0000 mg | DELAYED_RELEASE_TABLET | Freq: Every day | ORAL | Status: DC
  Administered 2020-09-29: 40 mg via ORAL
  Filled 2020-09-29: qty 1

## 2020-09-29 MED ORDER — MORPHINE SULFATE (PF) 2 MG/ML IV SOLN
2.0000 mg | INTRAVENOUS | Status: DC | PRN
Start: 2020-09-29 — End: 2020-09-29

## 2020-09-29 NOTE — H&P (Signed)
TRH H&P    Patient Demographics:    Peter Howe, is a 80 y.o. male  MRN: [id number]  DOB - 22-Aug-1940  Admit Date - 09/28/2020  Referring MD/NP/PA: Roderic Palau  Outpatient Primary MD for the patient is Loman Brooklyn, FNP  Patient coming from: home  Chief complaint- chest pain   HPI:    Peter Howe  is a 80 y.o. male,with history of HTN, obesity, Afib, and tachy/brady with pacemaker presents to the ED with a chief complaint of chest pressure. Patient reports that he played golf for the first half of the day. Then, he was at home sitting down when he had gradual onset of chest pressure. He reports it was in the center of his chest, and non radiating. He had no N/v. He had diaphoresis briefly. He had associated dyspnea as well. He reports that he thought he was dehydrated so, he drank crystal light and gatorade, but did not have relief. He reports that the symptoms were ongoing for approx 2 hours before he called EMS. When they arrived they gave him a nitro. He reports that it relieved his pain. Patient has not had return of symptoms since he has been here in the ED. He does have a Psychologist, forensic, but reports that it has not discharged today to his knowledge. Patient has no other complaints at this time.   Patient does not smoke. He reports that he usually drinks EtOH (beer) but hasn't in 3 weeks because he was on antibiotics for sinus infection. Patient denies illicit drugs. He has been vaccinated for covid. He is full code.   In the ED T 97.7, HR 47-76, R 16 - 20, BP 163/85 95% No leukocytosis Trop 4, repeat 6 Chemistry panel is unremarkable CXR = No acute cardiopulm disease EKG can't be located at this time - but reportedly had non specific t wave inversions Admission EKG pending Morphine and NaCl bolus in ED    Review of systems:    In addition to the HPI above,  No Fever-chills, No Headache, No changes  with Vision or hearing, No problems swallowing food or Liquids, Chest pain, Shortness of Breath, No Abdominal pain,  Nausea, but no Vomiting, bowel movements are regular, No Blood in stool or Urine, No dysuria, No new skin rashes or bruises, No new joints pains-aches,  No new weakness, tingling, numbness in any extremity, No recent weight gain or loss, No polyuria, polydypsia or polyphagia, No significant Mental Stressors.  All other systems reviewed and are negative.    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 Nevada Crane   PACEMAKER INSERTION  03/28/03   St. Jude-PPM   PPM  GENERATOR CHANGEOUT N/A 11/24/2018   Procedure: PPM GENERATOR CHANGEOUT;  Surgeon: [name], MD;  Location: [facility];  Service: Cardiovascular;  Laterality: N/A;      Social History:      Social History   Tobacco Use   Smoking status: Former    Packs/day: 1.00    Years: 30.00    Pack years: 30.00    Types: Cigarettes    Start date: 03/30/1955    Quit date: 03/09/1993    Years since quitting: 27.5   Smokeless tobacco: Never  Substance Use Topics   Alcohol use: Yes    Alcohol/week: 7.0 standard drinks    Types: 1 Glasses of wine, 4 Cans of beer, 2 Shots of liquor per week       Family History :     Family History  Problem Relation Age of Onset   Cancer Mother 37   CAD Father 84   Heart disease Daughter    Stroke Sister    Cancer Sister    Cancer Sister    Diabetes Brother    Cancer Brother    Alcohol abuse Brother       Home Medications:   Prior to Admission medications   Medication Sig Start Date End Date Taking? Authorizing Provider  acetaminophen (TYLENOL) 325  MG tablet Take 650 mg by mouth every 6 (six) hours as needed for mild pain.    [provider]  atenolol (TENORMIN) 100 MG tablet Take 1 tablet (100 mg total) by mouth daily. 04/15/20   Allred, Jeneen Rinks, MD  colchicine 0.6 MG tablet Take 1 tablet (0.6 mg total) by mouth 2 (two) times daily as needed (gout). 08/20/20   Loman Brooklyn, FNP  losartan (COZAAR) 100 MG tablet Take 0.5 tablets (50 mg total) by mouth daily. 08/14/20   Loman Brooklyn, FNP  Multiple Vitamin (MULTIVITAMIN WITH MINERALS) TABS tablet Take 1 tablet by mouth daily.    [provider]  nitroGLYCERIN (NITROSTAT) 0.4 MG SL tablet Place 1 tablet (0.4 mg total) under the tongue every 5 (five) minutes as needed for chest pain. 03/26/15   Herminio Commons, MD  omeprazole (PRILOSEC) 20 MG capsule Take 1 capsule (20 mg total) by mouth daily. 06/06/20   Gwenlyn Perking, FNP  rivaroxaban (XARELTO) 20 MG TABS tablet Take 1 tablet (20 mg total) by mouth daily with supper. 09/19/20   Allred, Jeneen Rinks, MD  traMADol (ULTRAM) 50 MG tablet Take 1 tablet (50 mg total) by mouth every 8 (eight) hours as needed for moderate pain. 07/11/20   Cherylann Ratel, PA-C  zolpidem (AMBIEN) 5 MG tablet Take 1 tablet (5 mg total) by mouth at bedtime. 04/24/20   Loman Brooklyn, FNP     Allergies:     Allergies  Allergen Reactions   Penicillins Rash    Has patient had a PCN reaction causing immediate rash, facial/tongue/throat swelling, SOB or lightheadedness with hypotension: No Has patient had a PCN reaction causing severe rash involving mucus membranes or skin necrosis: No Has patient had a PCN reaction that required hospitalization No Has patient had a PCN reaction occurring within the last 10 years: No If all of the above answers are "NO", then may proceed with Cephalosporin use.    Sulfonamide Derivatives Rash     Physical Exam:   Vitals  Blood pressure (!) 178/94, pulse 65, temperature 98 F (36.7 C), temperature source Oral,  resp. rate 17, height '5\' 4"'$  (1.626 m), weight 83.9  kg, SpO2 94 %.  1.  General: Patient lying supine in bed,  no acute distress   2. Psychiatric: Alert and oriented x 3, mood and behavior normal for situation, pleasant and cooperative with exam   3. Neurologic: Speech and language are normal, face is symmetric, moves all 4 extremities voluntarily, at baseline without acute deficits on limited exam   4. HEENMT:  Head is atraumatic, normocephalic, pupils reactive to light, neck is supple, trachea is midline, mucous membranes are moist   5. Respiratory : Lungs are clear to auscultation bilaterally without wheezing, rhonchi, rales, no cyanosis, no increase in work of breathing or accessory muscle use   6. Cardiovascular : Heart rate normal, rhythm is irregular, no murmurs, rubs or gallops, no peripheral edema, peripheral pulses palpated   7. Gastrointestinal:  Abdomen is soft, nondistended, nontender to palpation bowel sounds active, no masses or organomegaly palpated   8. Skin:  Skin is warm, dry and intact without rashes, acute lesions, or ulcers on limited exam   9.Musculoskeletal:  No acute deformities or trauma, no asymmetry in tone, no peripheral edema, peripheral pulses palpated, no tenderness to palpation in the extremities    Data Review:    CBC Recent Labs  Lab 09/28/20 2140  WBC 9.0  HGB 16.1  HCT 46.8  PLT 216  MCV 96.9  MCH 33.3  MCHC 34.4  RDW 12.3  LYMPHSABS 1.0  MONOABS 0.5  EOSABS 0.1  BASOSABS 0.1   ------------------------------------------------------------------------------------------------------------------  Results for orders placed or performed during the hospital encounter of 09/28/20 (from the past 48 hour(s))  CBC with Differential/Platelet     Status: None   Collection Time: 09/28/20  9:40 PM  Result Value Ref Range   WBC 9.0 4.0 - 10.5 K/uL   RBC 4.83 4.22 - 5.81 MIL/uL   Hemoglobin 16.1 13.0 - 17.0 g/dL   HCT 46.8 39.0 - 52.0 %    MCV 96.9 80.0 - 100.0 fL   MCH 33.3 26.0 - 34.0 pg   MCHC 34.4 30.0 - 36.0 g/dL   RDW 12.3 11.5 - 15.5 %   Platelets 216 150 - 400 K/uL   nRBC 0.0 0.0 - 0.2 %   Neutrophils Relative % 81 %   Neutro Abs 7.4 1.7 - 7.7 K/uL   Lymphocytes Relative 11 %   Lymphs Abs 1.0 0.7 - 4.0 K/uL   Monocytes Relative 6 %   Monocytes Absolute 0.5 0.1 - 1.0 K/uL   Eosinophils Relative 1 %   Eosinophils Absolute 0.1 0.0 - 0.5 K/uL   Basophils Relative 1 %   Basophils Absolute 0.1 0.0 - 0.1 K/uL   Immature Granulocytes 0 %   Abs Immature Granulocytes 0.03 0.00 - 0.07 K/uL    Comment: Performed at [facility], [address], [place],  [address]  Comprehensive metabolic panel     Status: Abnormal   Collection Time: 09/28/20  9:40 PM  Result Value Ref Range   Sodium 133 (L) 135 - 145 mmol/L   Potassium 4.2 3.5 - 5.1 mmol/L   Chloride 101 98 - 111 mmol/L   CO2 24 22 - 32 mmol/L   Glucose, Bld 112 (H) 70 - 99 mg/dL    Comment: Glucose reference range applies only to samples taken after fasting for at least 8 hours.   BUN 16 8 - 23 mg/dL   Creatinine, Ser 1.00 0.61 - 1.24 mg/dL   Calcium 9.0 8.9 - 10.3 mg/dL   Total Protein 7.2 6.5 -  8.1 g/dL   Albumin 4.2 3.5 - 5.0 g/dL   AST 30 15 - 41 U/L   ALT 28 0 - 44 U/L   Alkaline Phosphatase 68 38 - 126 U/L   Total Bilirubin 0.8 0.3 - 1.2 mg/dL   GFR, Estimated >60 >60 mL/min    Comment: (NOTE) Calculated using the CKD-EPI Creatinine Equation (2021)    Anion gap 8 5 - 15    Comment: Performed at [facility], [address] [place], [place] [address]  Troponin I (High Sensitivity)     Status: None   Collection Time: 09/28/20  9:40 PM  Result Value Ref Range   Troponin I (High Sensitivity) 4 <18 ng/L    Comment: (NOTE) Elevated high sensitivity troponin I (hsTnI) values and significant  changes across serial measurements may suggest ACS but many other  chronic and acute conditions are known to elevate hsTnI results.  Refer to the "Links"  section for chest pain algorithms and additional  guidance. Performed at [facility], [address] [place], [place] [address]   Troponin I (High Sensitivity)     Status: None   Collection Time: 09/29/20 12:11 AM  Result Value Ref Range   Troponin I (High Sensitivity) 6 <18 ng/L    Comment: (NOTE) Elevated high sensitivity troponin I (hsTnI) values and significant  changes across serial measurements may suggest ACS but many other  chronic and acute conditions are known to elevate hsTnI results.  Refer to the "Links" section for chest pain algorithms and additional  guidance. Performed at [facility], [address], [place],  [address]     Chemistries  Recent Labs  Lab 09/28/20 2140  NA 133*  K 4.2  CL 101  CO2 24  GLUCOSE 112*  BUN 16  CREATININE 1.00  CALCIUM 9.0  AST 30  ALT 28  ALKPHOS 68  BILITOT 0.8   ------------------------------------------------------------------------------------------------------------------  ------------------------------------------------------------------------------------------------------------------ GFR: Estimated Creatinine Clearance: 57.6 mL/min (by C-G formula based on SCr of 1 mg/dL). Liver Function Tests: Recent Labs  Lab 09/28/20 2140  AST 30  ALT 28  ALKPHOS 68  BILITOT 0.8  PROT 7.2  ALBUMIN 4.2   No results for input(s): LIPASE, AMYLASE in the last 168 hours. No results for input(s): AMMONIA in the last 168 hours. Coagulation Profile: No results for input(s): INR, PROTIME in the last 168 hours. Cardiac Enzymes: No results for input(s): CKTOTAL, CKMB, CKMBINDEX, TROPONINI in the last 168 hours. BNP (last 3 results) No results for input(s): PROBNP in the last 8760 hours. HbA1C: No results for input(s): HGBA1C in the last 72 hours. CBG: No results for input(s): GLUCAP in the last 168 hours. Lipid Profile: No results for input(s): CHOL, HDL, LDLCALC, TRIG, CHOLHDL, LDLDIRECT in the last 72 hours. Thyroid  Function Tests: No results for input(s): TSH, T4TOTAL, FREET4, T3FREE, THYROIDAB in the last 72 hours. Anemia Panel: No results for input(s): VITAMINB12, FOLATE, FERRITIN, TIBC, IRON, RETICCTPCT in the last 72 hours.  --------------------------------------------------------------------------------------------------------------- Urine analysis:    Component Value Date/Time   COLORURINE YELLOW 02/27/2017 1813   APPEARANCEUR CLEAR 02/27/2017 1813   LABSPEC 1.006 02/27/2017 1813   PHURINE 7.0 02/27/2017 1813   GLUCOSEU NEGATIVE 02/27/2017 1813   HGBUR MODERATE (A) 02/27/2017 1813   BILIRUBINUR NEGATIVE 02/27/2017 1813   KETONESUR 20 (A) 02/27/2017 1813   PROTEINUR 30 (A) 02/27/2017 1813   UROBILINOGEN 0.2 08/15/2012 1119   NITRITE NEGATIVE 02/27/2017 1813   LEUKOCYTESUR NEGATIVE 02/27/2017 1813  Imaging Results:    DG Chest Port 1 View  Result Date: 09/28/2020 CLINICAL DATA:  Chest tightness and shortness of breath beginning 2 hours ago. EXAM: PORTABLE CHEST 1 VIEW COMPARISON:  03/26/2016 FINDINGS: Cardiac silhouette mildly enlarged. Right anterior chest wall pacemaker is stable. No mediastinal or hilar masses. Clear lungs.  No pleural effusion or pneumothorax. Skeletal structures are grossly intact. IMPRESSION: No acute cardiopulmonary disease. Electronically Signed   By: Lajean Manes M.D.   On: 09/28/2020 22:12      Assessment & Plan:    Active Problems:   ATRIAL FIBRILLATION   Chest pain   Gastroesophageal reflux disease   Essential hypertension with goal blood pressure less than 130/80   Primary insomnia   Chest pain Trops stable Reported t wave inversions on ED EKG, Admission EKG pending Nitro relieved pain, PRN Nitro PRN EKG for chest pain Lipid panel in the AM Continue ARB, and beta blocker Last echo in 2018 - repeat echo Monitor on tele Afib Continue xarelto GERD Continue PPI HTN Continue ARB and atenolol  Continue to monitor Primary  insomnia Continue ambien    DVT Prophylaxis-   Xarelto - SCDs   AM Labs Ordered, also please review Full Orders  Family Communication: No family at bedside Code Status:  FULL  Admission status: Observation Time spent in minutes : [place] DO

## 2020-09-29 NOTE — Discharge Summary (Signed)
Physician Discharge Summary  [name] [id number] DOB: [birth year] DOA: 09/28/2020  PCP: Loman Brooklyn, FNP  Admit date: 09/28/2020 Discharge date: 09/29/2020  Time spent: 30 minutes  Recommendations for Outpatient Follow-up:  Repeat basic metabolic panel to evaluate lites renal function Reassessed blood pressure and adjust antihypertensive treatment as needed Follow resolution of patient's symptoms with referral to gastroenterology service if needed and to make sure patient has follow-up as instructed with cardiology service to further determine the need of outpatient stress test.   Discharge Diagnoses:  Active Problems:   ATRIAL FIBRILLATION   Nonspecific chest pain   Gastroesophageal reflux disease   Essential hypertension with goal blood pressure less than 130/80   Primary insomnia   Discharge Condition: Stable and improved.  Discharged home with instruction to follow-up with PCP in 10 days and with cardiology service as an outpatient.  CODE STATUS: Full code.  Diet recommendation: Heart healthy diet.  Filed Weights   09/28/20 2123  Weight: 83.9 kg    History of present illness:  As per H&P written by Dr.Zierle-Ghosh On 09/29/2020 Peter Howe  is a 80 y.o. male,with history of HTN, obesity, Afib, and tachy/brady with pacemaker presents to the ED with a chief complaint of chest pressure. Patient reports that he played golf for the first half of the day. Then, he was at home sitting down when he had gradual onset of chest pressure. He reports it was in the center of his chest, and non radiating. He had no N/v. He had diaphoresis briefly. He had associated dyspnea as well. He reports that he thought he was dehydrated so, he drank crystal light and gatorade, but did not have relief. He reports that the symptoms were ongoing for approx 2 hours before he called EMS. When they arrived they gave him a nitro. He reports that it relieved his pain. Patient has not had return  of symptoms since he has been here in the ED. He does have a Psychologist, forensic, but reports that it has not discharged today to his knowledge. Patient has no other complaints at this time.   Patient does not smoke. He reports that he usually drinks EtOH (beer) but hasn't in 3 weeks because he was on antibiotics for sinus infection. Patient denies illicit drugs. He has been vaccinated for covid. He is full code.   In the ED T 97.7, HR 47-76, R 16 - 20, BP 163/85 95% No leukocytosis Trop 4, repeat 6 Chemistry panel is unremarkable CXR = No acute cardiopulm disease EKG can't be located at this time - but reportedly had non specific t wave inversions Admission EKG pending Morphine and NaCl bolus in ED  Hospital Course:  1-atypical chest pain -Patient with risk factors including hypertension, age, history of atrial fibrillation. -Negative troponin, telemetry and EKG without acute ischemic changes. -2D echo reassuring and no demonstrating wall motion abnormalities; no significant valvular disorder and preserved ejection fraction. 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 -Given risk factors will recommend follow-up with cardiology to determine the need of further restratification/testing.  (Maybe a stress test as an outpatient). -Reassuring lipid panel with an LDL of 82.  2-gastroesophageal reflux disease -Continue PPI -Will aim for treatment twice a day for 30 days with subsequent daily use. -Lifestyle modification discussed with patient.  3-hypertension -Stable and otherwise well controlled -Continue current antihypertensive regimen -Heart healthy diet discussed with patient.  4-chronic atrial fibrillation -Continue beta-blocker for rate control -Continue Xarelto for secondary prevention -Continue outpatient follow-up with cardiology service.  5-history of insomnia -Continue the use of  Ambien.  Procedures: See below for x-ray report 2D echo:  1. Left ventricular ejection fraction, by estimation, is 60 to 65%. The  left ventricle has normal function. The left ventricle has no regional  wall motion abnormalities. There is mild concentric left ventricular  hypertrophy. Left ventricular diastolic  function could not be evaluated.   2. Right ventricular systolic function is normal. The right ventricular  size is normal. There is normal pulmonary artery systolic pressure. The  estimated right ventricular systolic pressure is [id number] mmHg.   3. Left atrial size was mild to moderately dilated.   4. The mitral valve is normal in structure. No evidence of mitral valve  regurgitation. No evidence of mitral stenosis.   5. The aortic valve is tricuspid. Aortic valve regurgitation is not  visualized. Mild aortic valve sclerosis is present, with no evidence of  aortic valve stenosis.   6. Aortic dilatation noted. There is borderline dilatation of the  ascending aorta, measuring 37 mm.   7. The inferior vena cava is normal in size with greater than 50%  respiratory variability, suggesting right atrial pressure of 3 mmHg.   Consultations: None  Discharge Exam: Vitals:   09/29/20 0903 09/29/20 1355  BP: (!) 147/90 109/70  Pulse: 67 (!) 59  Resp:    Temp:  98 F (36.7 C)  SpO2: 97% 96%    General: No further episode of chest pain; patient reports no shortness of breath, no nausea, no vomiting.  Feeling ready to go home.  Afebrile Cardiovascular: S1 and S2, no rubs, no gallops, no JVD. Respiratory: Good air movement bilaterally; no using accessory muscle.  No requiring oxygen supplementation. Abdomen: Soft, nontender, nondistended, positive bowel sounds. Extremities: No cyanosis or clubbing.  Discharge Instructions   Discharge Instructions     Diet - low sodium heart healthy   Complete by: As directed    Discharge instructions   Complete by: As directed    Follow  heart healthy diet Take medications as prescribed Maintain adequate hydration Please follow instructions as discussed for lifestyle changes regarding gastroesophageal flux disease Arrange follow-up with PCP in 10 days.      Allergies as of 09/29/2020       Reactions   Penicillins Rash   Has patient had a PCN reaction causing immediate rash, facial/tongue/throat swelling, SOB or lightheadedness with hypotension: No Has patient had a PCN reaction causing severe rash involving mucus membranes or skin necrosis: No Has patient had a PCN reaction that required hospitalization No Has patient had a PCN reaction occurring within the last 10 years: No If all of the above answers are "NO", then may proceed with Cephalosporin use.   Sulfonamide Derivatives Rash        Medication List     STOP taking these medications    omeprazole 20 MG capsule Commonly known as: PRILOSEC Replaced by: pantoprazole 40 MG tablet       TAKE these medications    acetaminophen 325 MG tablet Commonly known as: TYLENOL Take 650 mg by mouth every 6 (six) hours as needed for mild pain.   atenolol 100 MG tablet Commonly known as: TENORMIN Take 1 tablet (100 mg total) by mouth daily.   colchicine 0.6 MG tablet Take 1 tablet (0.6 mg total) by mouth 2 (two) times daily as needed (gout).  losartan 100 MG tablet Commonly known as: COZAAR Take 0.5 tablets (50 mg total) by mouth daily.   multivitamin with minerals Tabs tablet Take 1 tablet by mouth daily.   nitroGLYCERIN 0.4 MG SL tablet Commonly known as: NITROSTAT Place 1 tablet (0.4 mg total) under the tongue every 5 (five) minutes as needed for chest pain.   pantoprazole 40 MG tablet Commonly known as: PROTONIX Take 1 tablet (40 mg total) by mouth 2 (two) times daily. Take medication twice a day for 30 days; then daily. Replaces: omeprazole 20 MG capsule   traMADol 50 MG tablet Commonly known as: ULTRAM Take 1 tablet (50 mg total) by mouth  every 8 (eight) hours as needed for moderate pain.   Xarelto 20 MG Tabs tablet Generic drug: rivaroxaban Take 1 tablet (20 mg total) by mouth daily with supper.   zolpidem 5 MG tablet Commonly known as: AMBIEN Take 1 tablet (5 mg total) by mouth at bedtime.       Allergies  Allergen Reactions   Penicillins Rash    Has patient had a PCN reaction causing immediate rash, facial/tongue/throat swelling, SOB or lightheadedness with hypotension: No Has patient had a PCN reaction causing severe rash involving mucus membranes or skin necrosis: No Has patient had a PCN reaction that required hospitalization No Has patient had a PCN reaction occurring within the last 10 years: No If all of the above answers are "NO", then may proceed with Cephalosporin use.    Sulfonamide Derivatives Rash    Follow-up Information     Loman Brooklyn, FNP. Schedule an appointment as soon as possible for a visit in 10 day(s).   Specialty: Family Medicine Contact information: [place] [address] [phone]         [name], MD .   Specialty: Cardiology Contact information: [place] [address] [phone]                The results of significant diagnostics from this hospitalization (including imaging, microbiology, ancillary and laboratory) are listed below for reference.    Significant Diagnostic Studies: DG Chest Port 1 View  Result Date: 09/28/2020 CLINICAL DATA:  Chest tightness and shortness of breath beginning 2 hours ago. EXAM: PORTABLE CHEST 1 VIEW COMPARISON:  03/26/2016 FINDINGS: Cardiac silhouette mildly enlarged. Right anterior chest wall pacemaker is stable. No mediastinal or hilar masses. Clear lungs.  No pleural effusion or pneumothorax. Skeletal structures are grossly intact. IMPRESSION: No acute cardiopulmonary disease. Electronically Signed   By: Lajean Manes M.D.   On: 09/28/2020 22:12   ECHOCARDIOGRAM  COMPLETE  Result Date: 09/29/2020    ECHOCARDIOGRAM REPORT   Patient Name:   Peter Howe Date of Exam: 09/29/2020 Medical Rec #:  [id number]        Height:       64.0 in Accession #:    [id number]       Weight:       185.0 lb Date of Birth:  Apr 21, 1940        BSA:          1.893 m Patient Age:    56 years         BP:           178/94 mmHg Patient Gender: M                HR:           65 bpm. Exam Location:  [facility] Procedure: 2D Echo, Cardiac Doppler and Color Doppler Indications:    Chest Pain  History:        Patient has prior history of Echocardiogram examinations, most                 recent 03/26/2016. Pacemaker, Arrythmias:Atrial Fibrillation,                 Signs/Symptoms:Chest Pain; Risk Factors:Hypertension and Former                 Smoker.  Sonographer:    Wenda Low Referring Phys: [id number] ASIA B [place]  1. Left ventricular ejection fraction, by estimation, is 60 to 65%. The left ventricle has normal function. The left ventricle has no regional wall motion abnormalities. There is mild concentric left ventricular hypertrophy. Left ventricular diastolic function could not be evaluated.  2. Right ventricular systolic function is normal. The right ventricular size is normal. There is normal pulmonary artery systolic pressure. The estimated right ventricular systolic pressure is [id number] mmHg.  3. Left atrial size was mild to moderately dilated.  4. The mitral valve is normal in structure. No evidence of mitral valve regurgitation. No evidence of mitral stenosis.  5. The aortic valve is tricuspid. Aortic valve regurgitation is not visualized. Mild aortic valve sclerosis is present, with no evidence of aortic valve stenosis.  6. Aortic dilatation noted. There is borderline dilatation of the ascending aorta, measuring 37 mm.  7. The inferior vena cava is normal in size with greater than 50% respiratory variability, suggesting right atrial pressure of 3 mmHg. FINDINGS  Left  Ventricle: Left ventricular ejection fraction, by estimation, is 60 to 65%. The left ventricle has normal function. The left ventricle has no regional wall motion abnormalities. The left ventricular internal cavity size was normal in size. There is  mild concentric left ventricular hypertrophy. Left ventricular diastolic function could not be evaluated due to atrial fibrillation. Left ventricular diastolic function could not be evaluated. Normal left ventricular filling pressure. Right Ventricle: The right ventricular size is normal. No increase in right ventricular wall thickness. Right ventricular systolic function is normal. There is normal pulmonary artery systolic pressure. The tricuspid regurgitant velocity is 2.71 m/s, and  with an assumed right atrial pressure of 3 mmHg, the estimated right ventricular systolic pressure is [id number] mmHg. Left Atrium: Left atrial size was mild to moderately dilated. Right Atrium: Right atrial size was normal in size. Pericardium: There is no evidence of pericardial effusion. Mitral Valve: The mitral valve is normal in structure. Mild to moderate mitral annular calcification. No evidence of mitral valve regurgitation. No evidence of mitral valve stenosis. MV peak gradient, 5.7 mmHg. The mean mitral valve gradient is 1.0 mmHg. Tricuspid Valve: The tricuspid valve is normal in structure. Tricuspid valve regurgitation is trivial. No evidence of tricuspid stenosis. Aortic Valve: The aortic valve is tricuspid. Aortic valve regurgitation is not visualized. Mild aortic valve sclerosis is present, with no evidence of aortic valve stenosis. Aortic valve mean gradient measures 4.0 mmHg. Aortic valve peak gradient measures 5.6 mmHg. Aortic valve area, by VTI measures 2.19 cm. Pulmonic Valve: The pulmonic valve was normal in structure. Pulmonic valve regurgitation is not visualized. No evidence of pulmonic stenosis. Aorta: Aortic dilatation noted. There is borderline dilatation of the  ascending aorta, measuring 37 mm. Venous: The inferior vena cava is normal in size with greater than 50% respiratory variability, suggesting right atrial pressure of 3 mmHg. IAS/Shunts: No atrial level shunt detected  by color flow Doppler. Additional Comments: A device lead is visualized in the right atrium and right ventricle.  LEFT VENTRICLE PLAX 2D LVIDd:         4.56 cm  Diastology LVIDs:         2.33 cm  LV e' medial:    11.00 cm/s LV PW:         1.28 cm  LV E/e' medial:  8.4 LV IVS:        1.16 cm  LV e' lateral:   10.30 cm/s LVOT diam:     2.00 cm  LV E/e' lateral: 9.0 LV SV:         65 LV SV Index:   35 LVOT Area:     3.14 cm  RIGHT VENTRICLE RV Basal diam:  3.34 cm RV S prime:     14.00 cm/s TAPSE (M-mode): 3.1 cm LEFT ATRIUM             Index       RIGHT ATRIUM           Index LA diam:        4.90 cm 2.59 cm/m  RA Area:     19.10 cm LA Vol (A2C):   65.0 ml 34.34 ml/m RA Volume:   52.60 ml  27.79 ml/m LA Vol (A4C):   82.7 ml 43.70 ml/m LA Biplane Vol: 78.6 ml 41.53 ml/m  AORTIC VALVE AV Area (Vmax):    2.15 cm AV Area (Vmean):   1.90 cm AV Area (VTI):     2.19 cm AV Vmax:           118.00 cm/s AV Vmean:          93.200 cm/s AV VTI:            0.298 m AV Peak Grad:      5.6 mmHg AV Mean Grad:      4.0 mmHg LVOT Vmax:         80.90 cm/s LVOT Vmean:        56.500 cm/s LVOT VTI:          0.208 m LVOT/AV VTI ratio: 0.70  AORTA Ao Root diam: 3.60 cm Ao Asc diam:  3.70 cm MITRAL VALVE               TRICUSPID VALVE MV Area (PHT): 4.39 cm    TR Peak grad:   29.4 mmHg MV Area VTI:   2.58 cm    TR Vmax:        271.00 cm/s MV Peak grad:  5.7 mmHg MV Mean grad:  1.0 mmHg    SHUNTS MV Vmax:       1.19 m/s    Systemic VTI:  0.21 m MV Vmean:      42.7 cm/s   Systemic Diam: 2.00 cm MV Decel Time: 173 msec MV E velocity: 92.20 cm/s Fransico Him MD Electronically signed by Fransico Him MD Signature Date/Time: 09/29/2020/10:52:00 AM    Final     Microbiology: No results found for this or any previous visit (from  the past 240 hour(s)).   Labs: Basic Metabolic Panel: Recent Labs  Lab 09/28/20 2140  NA 133*  K 4.2  CL 101  CO2 24  GLUCOSE 112*  BUN 16  CREATININE 1.00  CALCIUM 9.0   Liver Function Tests: Recent Labs  Lab 09/28/20 2140  AST 30  ALT 28  ALKPHOS 68  BILITOT 0.8  PROT 7.2  ALBUMIN 4.2  CBC: Recent Labs  Lab 09/28/20 2140  WBC 9.0  NEUTROABS 7.4  HGB 16.1  HCT 46.8  MCV 96.9  PLT 216    Signed:  Barton Dubois MD.  [facility] 09/29/2020, 4:26 PM

## 2020-09-29 NOTE — Progress Notes (Signed)
  Echocardiogram 2D Echocardiogram has been performed.  Peter Howe 09/29/2020, 8:50 AM

## 2020-09-30 ENCOUNTER — Other Ambulatory Visit: Payer: Self-pay | Admitting: Internal Medicine

## 2020-10-01 ENCOUNTER — Telehealth: Payer: Self-pay

## 2020-10-01 NOTE — Telephone Encounter (Signed)
Transition Care Management Unsuccessful Follow-up Telephone Call  Date of discharge and from where:  Peter Howe 09/29/20  Diagnosis: A.Fib, chest pain   Attempts:  1st Attempt  Reason for unsuccessful TCM follow-up call:  Left voice message on patient primary #

## 2020-10-01 NOTE — Telephone Encounter (Signed)
Transition Care Management Follow-up Telephone Call Date of discharge and from where: [facility] 09/29/20 Diagnosis: A.Fib, Chest pain How have you been since you were released from the hospital? Feels fine other than a little jittery and nervous feeling Any questions or concerns? No  Items Reviewed: Did the pt receive and understand the discharge instructions provided? Yes  Medications obtained and verified? Yes  Other? No  Any new allergies since your discharge? No  Dietary orders reviewed? Yes Do you have support at home? Yes   Home Care and Equipment/Supplies: Were home health services ordered? no If so, what is the name of the agency? N/a  Has the agency set up a time to come to the patient's home? not applicable Were any new equipment or medical supplies ordered?  No What is the name of the medical supply agency? N/a Were you able to get the supplies/equipment? not applicable Do you have any questions related to the use of the equipment or supplies? No  Functional Questionnaire: (I = Independent and D = Dependent) ADLs: I  Bathing/Dressing- I  Meal Prep- I  Eating- I  Maintaining continence- I  Transferring/Ambulation- I  Managing Meds- I  Follow up appointments reviewed:  PCP Hospital f/u appt confirmed? Yes  Scheduled to see Hendricks Limes on 8/3 @ 8. [facility] f/u appt confirmed?  Not yet - but he will f/u with GI and cardiology   Are transportation arrangements needed? No  If their condition worsens, is the pt aware to call PCP or go to the Emergency Dept.? Yes Was the patient provided with contact information for the PCP's office or ED? Yes Was to pt encouraged to call back with questions or concerns? Yes

## 2020-10-07 ENCOUNTER — Other Ambulatory Visit: Payer: Self-pay | Admitting: Orthopaedic Surgery

## 2020-10-07 DIAGNOSIS — R2232 Localized swelling, mass and lump, left upper limb: Secondary | ICD-10-CM

## 2020-10-09 ENCOUNTER — Other Ambulatory Visit: Payer: Self-pay

## 2020-10-09 ENCOUNTER — Encounter: Payer: Self-pay | Admitting: Family Medicine

## 2020-10-09 ENCOUNTER — Ambulatory Visit (INDEPENDENT_AMBULATORY_CARE_PROVIDER_SITE_OTHER): Payer: Medicare HMO | Admitting: Family Medicine

## 2020-10-09 VITALS — BP 131/72 | HR 65 | Temp 97.7°F | Ht 64.0 in | Wt 190.8 lb

## 2020-10-09 DIAGNOSIS — R0789 Other chest pain: Secondary | ICD-10-CM

## 2020-10-09 DIAGNOSIS — I495 Sick sinus syndrome: Secondary | ICD-10-CM | POA: Diagnosis not present

## 2020-10-09 DIAGNOSIS — I4891 Unspecified atrial fibrillation: Secondary | ICD-10-CM | POA: Diagnosis not present

## 2020-10-09 DIAGNOSIS — G4700 Insomnia, unspecified: Secondary | ICD-10-CM | POA: Diagnosis not present

## 2020-10-09 DIAGNOSIS — K219 Gastro-esophageal reflux disease without esophagitis: Secondary | ICD-10-CM | POA: Diagnosis not present

## 2020-10-09 DIAGNOSIS — M109 Gout, unspecified: Secondary | ICD-10-CM | POA: Diagnosis not present

## 2020-10-09 DIAGNOSIS — Z008 Encounter for other general examination: Secondary | ICD-10-CM | POA: Diagnosis not present

## 2020-10-09 DIAGNOSIS — D6869 Other thrombophilia: Secondary | ICD-10-CM | POA: Diagnosis not present

## 2020-10-09 DIAGNOSIS — K08109 Complete loss of teeth, unspecified cause, unspecified class: Secondary | ICD-10-CM | POA: Diagnosis not present

## 2020-10-09 DIAGNOSIS — Z09 Encounter for follow-up examination after completed treatment for conditions other than malignant neoplasm: Secondary | ICD-10-CM | POA: Diagnosis not present

## 2020-10-09 DIAGNOSIS — E669 Obesity, unspecified: Secondary | ICD-10-CM | POA: Diagnosis not present

## 2020-10-09 DIAGNOSIS — I10 Essential (primary) hypertension: Secondary | ICD-10-CM | POA: Diagnosis not present

## 2020-10-09 DIAGNOSIS — I25119 Atherosclerotic heart disease of native coronary artery with unspecified angina pectoris: Secondary | ICD-10-CM | POA: Diagnosis not present

## 2020-10-09 LAB — BMP8+EGFR
BUN/Creatinine Ratio: 9 — ABNORMAL LOW (ref 10–24)
BUN: 11 mg/dL (ref 8–27)
CO2: 25 mmol/L (ref 20–29)
Calcium: 9.3 mg/dL (ref 8.6–10.2)
Chloride: 101 mmol/L (ref 96–106)
Creatinine, Ser: 1.2 mg/dL (ref 0.76–1.27)
Glucose: 98 mg/dL (ref 65–99)
Potassium: 4.5 mmol/L (ref 3.5–5.2)
Sodium: 140 mmol/L (ref 134–144)
eGFR: 61 mL/min/{1.73_m2} (ref >59)

## 2020-10-09 NOTE — Progress Notes (Signed)
Assessment & Plan:  1. Hospital discharge follow-up Encouraged to go ahead and schedule hospital follow-up with Dr. Rayann Heman, cardiologist.  2. Chest pressure Resolved. No reoccurrence.  - BMP8+EGFR  3. Gastroesophageal reflux disease without esophagitis Well controlled on current regimen.  - BMP8+EGFR   Return as scheduled.  Hendricks Limes, MSN, APRN, FNP-C [facility]  Subjective:    Patient ID: Peter Howe, male    DOB: [birth year], 80 y.o.   MRN: [id number]  Patient Care Team: Loman Brooklyn, FNP as PCP - General (Family Medicine) [name], MD as PCP - Electrophysiology (Cardiology)   Chief Complaint:  Chief Complaint  Patient presents with   Transitions Of Care    7/23 AP- Afib / chest pain     HPI: Peter Howe is a 80 y.o. male presenting on 10/09/2020 for Transitions Of Care (7/23 AP- Afib / chest pain )  Patient was admitted to [facility] 09/28/2020-09/29/2020 due to chest pressure with dyspnea and diaphoresis x2 hours. Pain was relieved in ER after 20 minutes with nitroglycerin. Negative work-up with troponin, telemetry, EKG, and 2D echo. He was started on PPI BID x30 days and advised to then decrease to daily use. Encouraged to follow-up with cardiology as an outpatient for possible stress test and gastroenterology as needed if symptoms persist.  Today he reports no reoccurrence of symptoms. He does have a cardiologist, Dr. Rayann Heman, but has not yet scheduled a follow-up appointment since he is scheduled for a colonoscopy next week.   New complaints: None   Social history:  Relevant past medical, surgical, family and social history reviewed and updated as indicated. Interim medical history since our last visit reviewed.  Allergies and medications reviewed and updated.  DATA REVIEWED: CHART IN EPIC  ROS: Negative unless specifically indicated above in HPI.    Current Outpatient Medications:    acetaminophen (TYLENOL) 325  MG tablet, Take 650 mg by mouth every 6 (six) hours as needed for mild pain., Disp: , Rfl:    atenolol (TENORMIN) 100 MG tablet, Take 1 tablet (100 mg total) by mouth daily., Disp: 90 tablet, Rfl: 3   colchicine 0.6 MG tablet, Take 1 tablet (0.6 mg total) by mouth 2 (two) times daily as needed (gout)., Disp: 60 tablet, Rfl: 3   losartan (COZAAR) 100 MG tablet, Take 0.5 tablets (50 mg total) by mouth daily., Disp: 45 tablet, Rfl: 0   Multiple Vitamin (MULTIVITAMIN WITH MINERALS) TABS tablet, Take 1 tablet by mouth daily., Disp: , Rfl:    nitroGLYCERIN (NITROSTAT) 0.4 MG SL tablet, Place 1 tablet under the tongue every 5 (five) minutes as needed for chest pain., Disp: 25 tablet, Rfl: 0   pantoprazole (PROTONIX) 40 MG tablet, Take 1 tablet (40 mg total) by mouth 2 (two) times daily. Take medication twice a day for 30 days; then daily., Disp: 60 tablet, Rfl: 2   rivaroxaban (XARELTO) 20 MG TABS tablet, Take 1 tablet (20 mg total) by mouth daily with supper., Disp: 30 tablet, Rfl: 5   traMADol (ULTRAM) 50 MG tablet, Take 1 tablet (50 mg total) by mouth every 8 (eight) hours as needed for moderate pain., Disp: 30 tablet, Rfl: 0   zolpidem (AMBIEN) 5 MG tablet, Take 1 tablet (5 mg total) by mouth at bedtime., Disp: 30 tablet, Rfl: 5   Allergies  Allergen Reactions   Morphine And Related Other (See Comments)    Patient states it wired him up and didn't help. Prefers not to  take again.   Penicillins Rash    Has patient had a PCN reaction causing immediate rash, facial/tongue/throat swelling, SOB or lightheadedness with hypotension: No Has patient had a PCN reaction causing severe rash involving mucus membranes or skin necrosis: No Has patient had a PCN reaction that required hospitalization No Has patient had a PCN reaction occurring within the last 10 years: No If all of the above answers are "NO", then may proceed with Cephalosporin use.    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 Nevada Crane   PACEMAKER INSERTION  03/28/03   St. Jude-PPM   PPM GENERATOR CHANGEOUT N/A 11/24/2018   Procedure: PPM GENERATOR CHANGEOUT;  Surgeon: [name], MD;  Location: [place] [facility];  Service: Cardiovascular;  Laterality: N/A;    Social History   Socioeconomic History   Marital status: Widowed    Spouse name: Not on file   Number of children: 1   Years of education: 12   Highest education level: High school graduate  Occupational History   Occupation: runs a Engineer, technical sales    Comment: part time  Tobacco Use   Smoking status: Former    Packs/day: 1.00    Years: 30.00    Pack years: 30.00    Types: Cigarettes    Start date: 03/30/1955    Quit date: 03/09/1993    Years since quitting: 27.6   Smokeless tobacco: Never  Vaping Use   Vaping Use: Never used  Substance and Sexual Activity   Alcohol use: Yes    Alcohol/week: 7.0 standard drinks    Types: 1 Glasses of wine, 4 Cans of beer, 2 Shots of liquor per week   Drug use: No   Sexual activity: Not Currently  Other Topics Concern   Not on file  Social History Narrative   Owns a Engineer, technical sales, enjoys golf   Lives in [place] Strain: Not on file  Food Insecurity: Not on file  Transportation Needs: Not on file  Physical Activity: Not on file  Stress: Not on file  Social Connections: Not on file  Intimate Partner Violence: Not on file        Objective:    BP 131/72   Pulse 65   Temp 97.7 F (36.5 C) (Temporal)   Ht '5\' 4"'  (1.626 m)   Wt 190 lb 12.8 oz (86.5 kg)   SpO2 96%   BMI 32.75 kg/m   Wt Readings from Last 3 Encounters:  10/09/20 190 lb 12.8 oz (86.5 kg)   09/28/20 185 lb (83.9 kg)  07/30/20 193 lb (87.5 kg)    Physical Exam Vitals reviewed.  Constitutional:      General: He is not in acute distress.    Appearance: Normal appearance. He is obese. He is not ill-appearing, toxic-appearing or diaphoretic.  HENT:     Head: Normocephalic and atraumatic.  Eyes:     General: No scleral icterus.       Right eye: No discharge.        Left eye: No discharge.     Conjunctiva/sclera: Conjunctivae normal.  Cardiovascular:     Rate and Rhythm: Normal rate. Rhythm irregular.     Heart sounds: Normal heart sounds. No murmur heard.   No friction rub. No gallop.  Pulmonary:     Effort: Pulmonary effort is normal. No respiratory distress.     Breath sounds: Normal breath sounds. No stridor. No wheezing, rhonchi or rales.  Musculoskeletal:        General: Normal range of motion.     Cervical back: Normal range of motion.  Skin:    General: Skin is warm and dry.  Neurological:     Mental Status: He is alert and oriented to person, place, and time. Mental status is at baseline.  Psychiatric:        Mood and Affect: Mood normal.        Behavior: Behavior normal.        Thought Content: Thought content normal.        Judgment: Judgment normal.    Lab Results  Component Value Date   TSH 0.825 04/19/2019   Lab Results  Component Value Date   WBC 9.0 09/28/2020   HGB 16.1 09/28/2020   HCT 46.8 09/28/2020   MCV 96.9 09/28/2020   PLT 216 09/28/2020   Lab Results  Component Value Date   NA 133 (L) 09/28/2020   K 4.2 09/28/2020   CO2 24 09/28/2020   GLUCOSE 112 (H) 09/28/2020   BUN 16 09/28/2020   CREATININE 1.00 09/28/2020   BILITOT 0.8 09/28/2020   ALKPHOS 68 09/28/2020   AST 30 09/28/2020   ALT 28 09/28/2020   PROT 7.2 09/28/2020   ALBUMIN 4.2 09/28/2020   CALCIUM 9.0 09/28/2020   ANIONGAP 8 09/28/2020   Lab Results  Component Value Date   CHOL 159 09/29/2020   Lab Results  Component Value Date   HDL 67 09/29/2020   Lab  Results  Component Value Date   LDLCALC 82 09/29/2020   Lab Results  Component Value Date   TRIG 50 09/29/2020   Lab Results  Component Value Date   CHOLHDL 2.4 09/29/2020   Lab Results  Component Value Date   HGBA1C 5.5 03/26/2016

## 2020-10-09 NOTE — Patient Instructions (Signed)
Reminder: please return after mid-September for your flu shot. If you receive this elsewhere, such as your pharmacy, please let us know so we can get this documented in your chart.   Please go ahead and schedule your hospital follow-up with Dr. Rayann Heman.

## 2020-10-10 ENCOUNTER — Telehealth: Payer: Self-pay | Admitting: Gastroenterology

## 2020-10-10 NOTE — Telephone Encounter (Signed)
Inbound call from patient. Have Endo/Colon procedure 8/9. Was admitted to the hospital 7/23 for 1 night for GERD. Was told to call to let us know before upcoming procedure

## 2020-10-14 NOTE — Telephone Encounter (Signed)
Spoke with pt and he sees cardiology on Thursday, he will have them contact our office regarding clearance. Appt cancelled.

## 2020-10-14 NOTE — Telephone Encounter (Signed)
Records reviewed. He needs cardiology clearance prior to endoscopy/anesthesia given his chest pain. Thanks.

## 2020-10-15 ENCOUNTER — Encounter: Payer: Medicare HMO | Admitting: Gastroenterology

## 2020-10-16 NOTE — Progress Notes (Signed)
Electrophysiology Office Note Date: 10/17/2020  ID:  Peter Howe, Peter Howe January 21, 1941, MRN [id number]  PCP: Loman Brooklyn, FNP Primary Cardiologist: None Electrophysiologist: [name], MD   CC: Post hospital follow-up  CABE MOSKWA is a 80 y.o. male seen today for [name], MD for Post hospital follow up.  Admitted 7/23 - 09/29/20 with atypical chest pain. Chief complaint was chest pressure. Patient reports that he played golf for the first half of the day. Then, he was at home sitting down when he had gradual onset of chest pressure. He reports it was in the center of his chest, and non radiating. He had no N/v. He had diaphoresis briefly. He had associated dyspnea as well. He reports that he thought he was dehydrated so, he drank crystal light and gatorade, but did not have relief. He reports that the symptoms were ongoing for approx 2 hours before he called EMS. Echo reassuring with LVEF 60-65% and no WMA. HS troponin was negative. Started on PPI with presumed reflex etiology of his pain.    Since last being seen in our clinic the patient reports doing well overall. He has had NO further discomfort since starting on PPI. he denies chest pain, palpitations, dyspnea, PND, orthopnea, nausea, vomiting, dizziness, syncope, edema, weight gain, or early satiety.  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 Nevada Crane   PACEMAKER INSERTION  03/28/03   St. Jude-PPM   PPM GENERATOR CHANGEOUT N/A 11/24/2018   Procedure: PPM GENERATOR CHANGEOUT;  Surgeon: [name], MD;  Location: [place] [facility];  Service: Cardiovascular;   Laterality: N/A;    Current Outpatient Medications  Medication Sig Dispense Refill   acetaminophen (TYLENOL) 325 MG tablet Take 650 mg by mouth every 6 (six) hours as needed for mild pain.     atenolol (TENORMIN) 100 MG tablet Take 1 tablet (100 mg total) by mouth daily. 90 tablet 3   colchicine 0.6 MG tablet Take 1 tablet (0.6 mg total) by mouth 2 (two) times daily as needed (gout). 60 tablet 3   losartan (COZAAR) 100 MG tablet Take 0.5 tablets (50 mg total) by mouth daily. 45 tablet 0   Multiple Vitamin (MULTIVITAMIN WITH MINERALS) TABS tablet Take 1 tablet by mouth daily.     nitroGLYCERIN (NITROSTAT) 0.4 MG SL tablet Place 1 tablet under the tongue every 5 (five) minutes as needed for chest pain. 25 tablet 0   pantoprazole (PROTONIX) 40 MG tablet Take 1 tablet (40 mg total) by mouth 2 (two) times daily. Take medication twice a day for 30 days; then daily. 60 tablet 2   rivaroxaban (XARELTO) 20 MG TABS tablet Take 1 tablet (20 mg total) by mouth daily with supper. 30 tablet 5   traMADol (ULTRAM) 50 MG tablet Take 1 tablet (50 mg total) by mouth every 8 (eight) hours as needed for moderate pain. 30 tablet 0   zolpidem (AMBIEN) 5 MG tablet Take 1 tablet (5 mg total) by mouth at bedtime. 30 tablet 5   No current facility-administered medications for this visit.  Allergies:   Morphine and related, Penicillins, and Sulfonamide derivatives   Social History: Social History   Socioeconomic History   Marital status: Widowed    Spouse name: Not on file   Number of children: 1   Years of education: 12   Highest education level: High school graduate  Occupational History   Occupation: runs a Engineer, technical sales    Comment: part time  Tobacco Use   Smoking status: Former    Packs/day: 1.00    Years: 30.00    Pack years: 30.00    Types: Cigarettes    Start date: 03/30/1955    Quit date: 03/09/1993    Years since quitting: 27.6   Smokeless tobacco: Never  Vaping Use   Vaping Use: Never  used  Substance and Sexual Activity   Alcohol use: Yes    Alcohol/week: 7.0 standard drinks    Types: 1 Glasses of wine, 4 Cans of beer, 2 Shots of liquor per week   Drug use: No   Sexual activity: Not Currently  Other Topics Concern   Not on file  Social History Narrative   Owns a Engineer, technical sales, enjoys golf   Lives in [place] Determinants of Health   Financial Resource Strain: Not on file  Food Insecurity: Not on file  Transportation Needs: Not on file  Physical Activity: Not on file  Stress: Not on file  Social Connections: Not on file  Intimate Partner Violence: Not on file    Family History: Family History  Problem Relation Age of Onset   Cancer Mother 43   CAD Father 80   Heart disease Daughter    Stroke Sister    Cancer Sister    Cancer Sister    Diabetes Brother    Cancer Brother    Alcohol abuse Brother      Review of Systems: All other systems reviewed and are otherwise negative except as noted above.  Physical Exam: Vitals:   10/17/20 1230  BP: 130/74  Pulse: 64  SpO2: 96%  Weight: 190 lb 12.8 oz (86.5 kg)  Height: '5\' 4"'$  (1.626 m)     GEN- The patient is well appearing, alert and oriented x 3 today.   HEENT: normocephalic, atraumatic; sclera clear, conjunctiva pink; hearing intact; oropharynx clear; neck supple  Lungs- Clear to ausculation bilaterally, normal work of breathing.  No wheezes, rales, rhonchi Heart- Regular rate and rhythm, no murmurs, rubs or gallops  GI- soft, non-tender, non-distended, bowel sounds present  Extremities- no clubbing or cyanosis. No edema MS- no significant deformity or atrophy Skin- warm and dry, no rash or lesion; PPM pocket well healed Psych- euthymic mood, full affect Neuro- strength and sensation are intact  PPM Interrogation- reviewed in detail today,  See PACEART report  EKG:  EKG is not ordered today.  Recent Labs: 09/28/2020: ALT 28; Hemoglobin 16.1; Platelets 216 10/09/2020: BUN 11;  Creatinine, Ser 1.20; Potassium 4.5; Sodium 140   Wt Readings from Last 3 Encounters:  10/17/20 190 lb 12.8 oz (86.5 kg)  10/09/20 190 lb 12.8 oz (86.5 kg)  09/28/20 185 lb (83.9 kg)     Other studies Reviewed: Additional studies/ records that were reviewed today include: Previous EP office notes, Previous remote checks, Most recent labwork.   Assessment and Plan:  1. Symptomatic bradycardia s/p St. Jude PPM  Normal PPM function See Pace Art report No changes today  2. Permanent Afib Rate controlled On Xarelto for CHA2DS2-VASc of at least 3.  3.  HTN Stable on current regimen  4. Atypical chest pain Work up unrevealing, No cardiac source. HS trop negative and without trend. Echo 09/29/20 LVEF 60-65% Completely resolved with PPI.  With no exertional component  5. Cardiac clearance for Colonoscopy +/- EGD Pt is at relatively low risk for this procedure given normal Echo and complete resolution of his chest discomfort with PPI. He has no exertional symptoms and no WMA on Echo. Pt understands to call us if any change to his symptoms prior to Endoscopy.  At this time, OK to proceed without further cardiac work up. Re-assess as needed if symptoms change.    Current medicines are reviewed at length with the patient today.   The patient does not have concerns regarding his medicines.  The following changes were made today:  none  Labs/ tests ordered today include:  No orders of the defined types were placed in this encounter.  Disposition:   Follow up with EP APP in 6 Months. Sooner with issues.   Jacalyn Lefevre, PA-C  10/17/2020 12:58 PM  [address] [place] [address] [phone] (office) [phone] (fax)

## 2020-10-17 ENCOUNTER — Other Ambulatory Visit: Payer: Self-pay

## 2020-10-17 ENCOUNTER — Ambulatory Visit: Payer: Medicare HMO | Admitting: Student

## 2020-10-17 VITALS — BP 130/74 | HR 64 | Ht 64.0 in | Wt 190.8 lb

## 2020-10-17 DIAGNOSIS — I4821 Permanent atrial fibrillation: Secondary | ICD-10-CM | POA: Diagnosis not present

## 2020-10-17 DIAGNOSIS — Z950 Presence of cardiac pacemaker: Secondary | ICD-10-CM

## 2020-10-17 DIAGNOSIS — I495 Sick sinus syndrome: Secondary | ICD-10-CM | POA: Diagnosis not present

## 2020-10-17 DIAGNOSIS — I10 Essential (primary) hypertension: Secondary | ICD-10-CM | POA: Diagnosis not present

## 2020-10-17 DIAGNOSIS — Z01810 Encounter for preprocedural cardiovascular examination: Secondary | ICD-10-CM | POA: Diagnosis not present

## 2020-10-17 DIAGNOSIS — Z01818 Encounter for other preprocedural examination: Secondary | ICD-10-CM

## 2020-10-17 LAB — CUP PACEART INCLINIC DEVICE CHECK
Battery Remaining Longevity: 118 mo
Battery Voltage: 3.02 V
Brady Statistic RA Percent Paced: 0 %
Brady Statistic RV Percent Paced: 47 %
Date Time Interrogation Session: 20220811125348
Implantable Lead Implant Date: 19980120
Implantable Lead Implant Date: 20050119
Implantable Lead Location: 753859
Implantable Lead Location: 753860
Implantable Pulse Generator Implant Date: 20200917
Lead Channel Impedance Value: 737.5 Ohm
Lead Channel Pacing Threshold Amplitude: 0.5 V
Lead Channel Pacing Threshold Amplitude: 0.5 V
Lead Channel Pacing Threshold Pulse Width: 0.4 ms
Lead Channel Pacing Threshold Pulse Width: 0.4 ms
Lead Channel Sensing Intrinsic Amplitude: 12 mV
Lead Channel Setting Pacing Amplitude: 2.5 V
Lead Channel Setting Pacing Pulse Width: 0.4 ms
Lead Channel Setting Sensing Sensitivity: 2 mV
Pulse Gen Model: 2272
Pulse Gen Serial Number: 9154140

## 2020-10-17 NOTE — Patient Instructions (Signed)
Medication Instructions:  Your physician recommends that you continue on your current medications as directed. Please refer to the Current Medication list given to you today.  *If you need a refill on your cardiac medications before your next appointment, please call your pharmacy*   Lab Work: None If you have labs (blood work) drawn today and your tests are completely normal, you will receive your results only by: MyChart Message (if you have MyChart) OR A paper copy in the mail If you have any lab test that is abnormal or we need to change your treatment, we will call you to review the results.   Follow-Up: At [facility], you and your health needs are our priority.  As part of our continuing mission to provide you with exceptional heart care, we have created designated Provider Care Teams.  These Care Teams include your primary Cardiologist (physician) and Advanced Practice Providers (APPs -  Physician Assistants and Nurse Practitioners) who all work together to provide you with the care you need, when you need it.   Your next appointment:   6 month(s)  The format for your next appointment:   In Person  Provider:   You may see James Allred, MD or one of the following Advanced Practice Providers on your designated Care Team:   Renee Ursuy, PA-C Michael "Andy" Tillery, PA-C    

## 2020-10-21 ENCOUNTER — Telehealth: Payer: Self-pay | Admitting: Gastroenterology

## 2020-10-21 ENCOUNTER — Other Ambulatory Visit: Payer: Self-pay | Admitting: Family Medicine

## 2020-10-21 DIAGNOSIS — I10 Essential (primary) hypertension: Secondary | ICD-10-CM

## 2020-10-21 NOTE — Telephone Encounter (Signed)
Cardiac clearance for Colonoscopy +/- EGD Pt is at relatively low risk for this procedure given normal Echo and complete resolution of his chest discomfort with PPI. He has no exertional symptoms and no WMA on Echo. Pt understands to call us if any change to his symptoms prior to Endoscopy.  At this time, OK to proceed without further cardiac work up. Re-assess as needed if symptoms change.  Pt ok to be scheduled for Endo/Colon in the [facility], please schedule

## 2020-10-22 ENCOUNTER — Encounter: Payer: Self-pay | Admitting: Gastroenterology

## 2020-10-22 NOTE — Telephone Encounter (Signed)
Patient scheduled for Endo/Colon with Dr. Tarri Glenn 12/06/20 3:30pm pre visit// Procedure endo/colon for 12/20/20 at 10:30am.  Thank you

## 2020-10-23 ENCOUNTER — Encounter: Payer: Self-pay | Admitting: Family Medicine

## 2020-10-23 ENCOUNTER — Ambulatory Visit (INDEPENDENT_AMBULATORY_CARE_PROVIDER_SITE_OTHER): Payer: Medicare HMO | Admitting: Family Medicine

## 2020-10-23 ENCOUNTER — Other Ambulatory Visit: Payer: Self-pay

## 2020-10-23 VITALS — BP 148/88 | HR 61 | Temp 97.6°F | Ht 64.0 in | Wt 191.6 lb

## 2020-10-23 DIAGNOSIS — I700 Atherosclerosis of aorta: Secondary | ICD-10-CM

## 2020-10-23 DIAGNOSIS — Z0001 Encounter for general adult medical examination with abnormal findings: Secondary | ICD-10-CM

## 2020-10-23 DIAGNOSIS — Z7189 Other specified counseling: Secondary | ICD-10-CM | POA: Diagnosis not present

## 2020-10-23 DIAGNOSIS — R69 Illness, unspecified: Secondary | ICD-10-CM | POA: Diagnosis not present

## 2020-10-23 DIAGNOSIS — K219 Gastro-esophageal reflux disease without esophagitis: Secondary | ICD-10-CM

## 2020-10-23 DIAGNOSIS — F5101 Primary insomnia: Secondary | ICD-10-CM

## 2020-10-23 DIAGNOSIS — Z79899 Other long term (current) drug therapy: Secondary | ICD-10-CM | POA: Diagnosis not present

## 2020-10-23 DIAGNOSIS — Z0000 Encounter for general adult medical examination without abnormal findings: Secondary | ICD-10-CM

## 2020-10-23 MED ORDER — ZOLPIDEM TARTRATE 5 MG PO TABS: 5.0000 mg | ORAL_TABLET | Freq: Every day | ORAL | 5 refills | Status: DC

## 2020-10-23 NOTE — Progress Notes (Signed)
Assessment & Plan:  1. Well adult exam Preventive health education provided. Patient to return for influenza vaccine when we have it in stock. He will schedule an appointment for his COVID booster.  2. ACP (advance care planning) Documents provided for patient to complete.  3-4. Primary insomnia/Controlled substance agreement signed Well controlled on current regimen. Controlled substance agreement updated today. Urine drug screen collected today. PDMP reviewed with no concerning findings. - zolpidem (AMBIEN) 5 MG tablet; Take 1 tablet (5 mg total) by mouth at bedtime.  Dispense: 30 tablet; Refill: 5 - ToxASSURE Select 13 (MW), Urine  5. Aortic atherosclerosis (HCC) Intolerant to statins.  6. Gastroesophageal reflux disease without esophagitis Well controlled on current regimen. EGD scheduled for October.    Follow-up: Return in about 6 months (around 04/25/2021) for follow-up of chronic medication conditions.   Hendricks Limes, MSN, APRN, FNP-C [facility]  Subjective:  Patient ID: Peter Howe, Peter Howe    DOB: 08-09-1940  Age: 80 y.o. MRN: [id number]  Patient Care Team: Loman Brooklyn, FNP as PCP - General (Family Medicine) [name], MD as PCP - Electrophysiology (Cardiology)   CC:  Chief Complaint  Patient presents with   Annual Exam    HPI Peter Howe presents for his annual physical.  Occupation: Engineer, technical sales, Marital status: Widowed, Substance use: None Diet: Regular, Exercise: Golf, walking, weight lifting, and just joined a health club Last eye exam: 1 year ago Last dental exam: a couple of years ago Hepatitis C Screening: Never PSA: Unknown Immunizations: Flu Vaccine:  agreeable, but we do not have in stock yet Tdap Vaccine: declined  Shingrix Vaccine: declined  COVID-19 Vaccine: up to date Pneumonia Vaccine: up to date  Advanced Directives Patient does not have advanced directives including DNR, living will,  healthcare power of attorney, financial power of attorney, and MOST form.   DEPRESSION SCREENING PHQ 2/9 Scores 10/23/2020 10/09/2020 06/03/2020 04/24/2020 10/18/2019 06/05/2019 05/19/2019  PHQ - 2 Score 0 0 0 0 0 0 0  PHQ- 9 Score 3 0 - - - - -     Review of Systems  Constitutional:  Negative for chills, fever, malaise/fatigue and weight loss.  HENT:  Negative for congestion, ear discharge, ear pain, nosebleeds, sinus pain, sore throat and tinnitus.   Eyes:  Negative for blurred vision, double vision, pain, discharge and redness.  Respiratory:  Negative for cough, shortness of breath and wheezing.   Cardiovascular:  Negative for chest pain, palpitations and leg swelling.  Gastrointestinal:  Negative for abdominal pain, constipation, diarrhea, heartburn, nausea and vomiting.  Genitourinary:  Negative for dysuria, frequency and urgency.       Denies trouble initiating a urine stream, split stream, nocturia, and dribbling. Does have a weak stream.  Musculoskeletal:  Negative for myalgias.  Skin:  Negative for rash.  Neurological:  Negative for dizziness, seizures, weakness and headaches.  Psychiatric/Behavioral:  Negative for depression, substance abuse and suicidal ideas. The patient is not nervous/anxious.     Current Outpatient Medications:    acetaminophen (TYLENOL) 325 MG tablet, Take 650 mg by mouth every 6 (six) hours as needed for mild pain., Disp: , Rfl:    atenolol (TENORMIN) 100 MG tablet, Take 1 tablet (100 mg total) by mouth daily., Disp: 90 tablet, Rfl: 3   colchicine 0.6 MG tablet, Take 1 tablet (0.6 mg total) by mouth 2 (two) times daily as needed (gout)., Disp: 60 tablet, Rfl: 3   losartan (COZAAR) 100 MG tablet, Take  0.5 tablets (50 mg total) by mouth daily., Disp: 45 tablet, Rfl: 0   Multiple Vitamin (MULTIVITAMIN WITH MINERALS) TABS tablet, Take 1 tablet by mouth daily., Disp: , Rfl:    nitroGLYCERIN (NITROSTAT) 0.4 MG SL tablet, Place 1 tablet under the tongue every 5 (five)  minutes as needed for chest pain., Disp: 25 tablet, Rfl: 0   pantoprazole (PROTONIX) 40 MG tablet, Take 1 tablet (40 mg total) by mouth 2 (two) times daily. Take medication twice a day for 30 days; then daily., Disp: 60 tablet, Rfl: 2   rivaroxaban (XARELTO) 20 MG TABS tablet, Take 1 tablet (20 mg total) by mouth daily with supper., Disp: 30 tablet, Rfl: 5   traMADol (ULTRAM) 50 MG tablet, Take 1 tablet (50 mg total) by mouth every 8 (eight) hours as needed for moderate pain., Disp: 30 tablet, Rfl: 0   zolpidem (AMBIEN) 5 MG tablet, Take 1 tablet (5 mg total) by mouth at bedtime., Disp: 30 tablet, Rfl: 5  Allergies  Allergen Reactions   Morphine And Related Other (See Comments)    Patient states it wired him up and didn't help. Prefers not to take again.   Penicillins Rash    Has patient had a PCN reaction causing immediate rash, facial/tongue/throat swelling, SOB or lightheadedness with hypotension: No Has patient had a PCN reaction causing severe rash involving mucus membranes or skin necrosis: No Has patient had a PCN reaction that required hospitalization No Has patient had a PCN reaction occurring within the last 10 years: No If all of the above answers are "NO", then may proceed with Cephalosporin use.    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 Nevada Crane   PACEMAKER INSERTION  03/28/03   St. Jude-PPM   PPM GENERATOR CHANGEOUT N/A 11/24/2018   Procedure: PPM GENERATOR CHANGEOUT;  Surgeon: [name], MD;  Location: [facility];  Service: Cardiovascular;  Laterality: N/A;    Family History  Problem Relation Age of Onset   Cancer Mother 7   CAD Father 52   Heart disease  Daughter    Stroke Sister    Cancer Sister    Cancer Sister    Diabetes Brother    Cancer Brother    Alcohol abuse Brother     Social History   Socioeconomic History   Marital status: Widowed    Spouse name: Not on file   Number of children: 1   Years of education: 12   Highest education level: High school graduate  Occupational History   Occupation: runs a Engineer, technical sales    Comment: part time  Tobacco Use   Smoking status: Former    Packs/day: 1.00    Years: 30.00    Pack years: 30.00    Types: Cigarettes    Start date: 03/30/1955    Quit date: 03/09/1993    Years since quitting: 27.6   Smokeless tobacco: Never  Vaping Use   Vaping Use: Never used  Substance and Sexual Activity   Alcohol use: Yes    Alcohol/week: 7.0 standard drinks    Types: 1 Glasses of wine, 4 Cans of beer, 2  Shots of liquor per week   Drug use: No   Sexual activity: Not Currently  Other Topics Concern   Not on file  Social History Narrative   Owns a Engineer, technical sales, enjoys golf   Lives in [place] Strain: Not on file  Food Insecurity: Not on file  Transportation Needs: Not on file  Physical Activity: Not on file  Stress: Not on file  Social Connections: Not on file  Intimate Partner Violence: Not on file      Objective:    BP (!) 148/88   Pulse 61   Temp 97.6 F (36.4 C) (Temporal)   Ht 5' 4" (1.626 m)   Wt 191 lb 9.6 oz (86.9 kg)   SpO2 95%   BMI 32.89 kg/m   Wt Readings from Last 3 Encounters:  10/23/20 191 lb 9.6 oz (86.9 kg)  10/17/20 190 lb 12.8 oz (86.5 kg)  10/09/20 190 lb 12.8 oz (86.5 kg)    Physical Exam Vitals reviewed.  Constitutional:      General: He is not in acute distress.    Appearance: Normal appearance. He is obese. He is not ill-appearing, toxic-appearing or diaphoretic.  HENT:     Head: Normocephalic and atraumatic.     Right Ear: Tympanic membrane, ear canal and external ear normal. There is  no impacted cerumen.     Left Ear: Tympanic membrane, ear canal and external ear normal. There is no impacted cerumen.     Nose: Nose normal. No congestion or rhinorrhea.     Mouth/Throat:     Mouth: Mucous membranes are moist.     Pharynx: Oropharynx is clear. No oropharyngeal exudate or posterior oropharyngeal erythema.  Eyes:     General: No scleral icterus.       Right eye: No discharge.        Left eye: No discharge.     Conjunctiva/sclera: Conjunctivae normal.     Pupils: Pupils are equal, round, and reactive to light.  Neck:     Vascular: No carotid bruit.  Cardiovascular:     Rate and Rhythm: Normal rate and regular rhythm.     Heart sounds: Normal heart sounds. No murmur heard.   No friction rub. No gallop.  Pulmonary:     Effort: Pulmonary effort is normal. No respiratory distress.     Breath sounds: Normal breath sounds. No stridor. No wheezing, rhonchi or rales.  Abdominal:     General: Abdomen is flat. Bowel sounds are normal. There is no distension.     Palpations: Abdomen is soft. There is no hepatomegaly, splenomegaly or mass.     Tenderness: There is no abdominal tenderness. There is no guarding or rebound.     Hernia: No hernia is present.  Musculoskeletal:        General: Normal range of motion.     Cervical back: Normal range of motion and neck supple. No rigidity. No muscular tenderness.     Right lower leg: No edema.     Left lower leg: No edema.  Lymphadenopathy:     Cervical: No cervical adenopathy.  Skin:    General: Skin is warm and dry.     Capillary Refill: Capillary refill takes less than 2 seconds.  Neurological:     General: No focal deficit present.     Mental Status: He is alert and oriented to person, place, and time. Mental status is at baseline.  Psychiatric:  Mood and Affect: Mood normal.        Behavior: Behavior normal.        Thought Content: Thought content normal.        Judgment: Judgment normal.    Lab Results  Component  Value Date   TSH 0.825 04/19/2019   Lab Results  Component Value Date   WBC 9.0 09/28/2020   HGB 16.1 09/28/2020   HCT 46.8 09/28/2020   MCV 96.9 09/28/2020   PLT 216 09/28/2020   Lab Results  Component Value Date   NA 140 10/09/2020   K 4.5 10/09/2020   CO2 25 10/09/2020   GLUCOSE 98 10/09/2020   BUN 11 10/09/2020   CREATININE 1.20 10/09/2020   BILITOT 0.8 09/28/2020   ALKPHOS 68 09/28/2020   AST 30 09/28/2020   ALT 28 09/28/2020   PROT 7.2 09/28/2020   ALBUMIN 4.2 09/28/2020   CALCIUM 9.3 10/09/2020   ANIONGAP 8 09/28/2020   EGFR 61 10/09/2020   Lab Results  Component Value Date   CHOL 159 09/29/2020   Lab Results  Component Value Date   HDL 67 09/29/2020   Lab Results  Component Value Date   LDLCALC 82 09/29/2020   Lab Results  Component Value Date   TRIG 50 09/29/2020   Lab Results  Component Value Date   CHOLHDL 2.4 09/29/2020   Lab Results  Component Value Date   HGBA1C 5.5 03/26/2016

## 2020-10-23 NOTE — Patient Instructions (Signed)
Reminder: please return after mid-September for your flu shot. If you receive this elsewhere, such as your pharmacy, please let us know so we can get this documented in your chart.   

## 2020-10-26 LAB — TOXASSURE SELECT 13 (MW), URINE

## 2020-10-27 ENCOUNTER — Emergency Department (HOSPITAL_COMMUNITY)
Admission: EM | Admit: 2020-10-27 | Discharge: 2020-10-28 | Disposition: A | Discharge status: Home / Self Care | Payer: Medicare HMO | Attending: Emergency Medicine | Admitting: Emergency Medicine

## 2020-10-27 ENCOUNTER — Other Ambulatory Visit: Payer: Self-pay

## 2020-10-27 DIAGNOSIS — I10 Essential (primary) hypertension: Secondary | ICD-10-CM | POA: Insufficient documentation

## 2020-10-27 DIAGNOSIS — Z87891 Personal history of nicotine dependence: Secondary | ICD-10-CM | POA: Diagnosis not present

## 2020-10-27 DIAGNOSIS — K219 Gastro-esophageal reflux disease without esophagitis: Secondary | ICD-10-CM | POA: Insufficient documentation

## 2020-10-27 DIAGNOSIS — Z950 Presence of cardiac pacemaker: Secondary | ICD-10-CM | POA: Diagnosis not present

## 2020-10-27 DIAGNOSIS — Z85828 Personal history of other malignant neoplasm of skin: Secondary | ICD-10-CM | POA: Diagnosis not present

## 2020-10-27 DIAGNOSIS — Z743 Need for continuous supervision: Secondary | ICD-10-CM | POA: Diagnosis not present

## 2020-10-27 DIAGNOSIS — R0689 Other abnormalities of breathing: Secondary | ICD-10-CM | POA: Diagnosis not present

## 2020-10-27 DIAGNOSIS — Z7901 Long term (current) use of anticoagulants: Secondary | ICD-10-CM | POA: Diagnosis not present

## 2020-10-27 DIAGNOSIS — I4891 Unspecified atrial fibrillation: Secondary | ICD-10-CM | POA: Diagnosis not present

## 2020-10-27 DIAGNOSIS — Z79899 Other long term (current) drug therapy: Secondary | ICD-10-CM | POA: Insufficient documentation

## 2020-10-27 DIAGNOSIS — R1013 Epigastric pain: Secondary | ICD-10-CM | POA: Diagnosis not present

## 2020-10-27 DIAGNOSIS — R001 Bradycardia, unspecified: Secondary | ICD-10-CM | POA: Diagnosis not present

## 2020-10-27 DIAGNOSIS — R140 Abdominal distension (gaseous): Secondary | ICD-10-CM | POA: Diagnosis not present

## 2020-10-27 NOTE — ED Triage Notes (Signed)
Pt reports epigastric pressure since 1600 today with an episode of diaphoresis. Pt is belching during triage and reports that this helps with the pressure. Hx of similar episode one month ago in which he was treated with protonix. Pt took Mylanta earlier today without relief.

## 2020-10-27 NOTE — ED Notes (Signed)
Pt placed on cardiac monitor with BP to set cycle every 30 minutes. Continuous pulse oximeter applied.  

## 2020-10-28 DIAGNOSIS — Z0100 Encounter for examination of eyes and vision without abnormal findings: Secondary | ICD-10-CM | POA: Diagnosis not present

## 2020-10-28 DIAGNOSIS — H5200 Hypermetropia, unspecified eye: Secondary | ICD-10-CM | POA: Diagnosis not present

## 2020-10-28 LAB — TROPONIN I (HIGH SENSITIVITY)
Troponin I (High Sensitivity): 6 ng/L (ref <18)
Troponin I (High Sensitivity): 6 ng/L (ref <18)

## 2020-10-28 LAB — CBC WITH DIFFERENTIAL/PLATELET
Abs Immature Granulocytes: 0.04 10*3/uL (ref 0.00–0.07)
Basophils Absolute: 0.1 10*3/uL (ref 0.0–0.1)
Basophils Relative: 1 %
Eosinophils Absolute: 0.1 10*3/uL (ref 0.0–0.5)
Eosinophils Relative: 1 %
HCT: 46.3 % (ref 39.0–52.0)
Hemoglobin: 15.6 g/dL (ref 13.0–17.0)
Immature Granulocytes: 0 %
Lymphocytes Relative: 10 %
Lymphs Abs: 1 10*3/uL (ref 0.7–4.0)
MCH: 33.4 pg (ref 26.0–34.0)
MCHC: 33.7 g/dL (ref 30.0–36.0)
MCV: 99.1 fL (ref 80.0–100.0)
Monocytes Absolute: 0.7 10*3/uL (ref 0.1–1.0)
Monocytes Relative: 7 %
Neutro Abs: 8.6 10*3/uL — ABNORMAL HIGH (ref 1.7–7.7)
Neutrophils Relative %: 81 %
Platelets: 157 10*3/uL (ref 150–400)
RBC: 4.67 MIL/uL (ref 4.22–5.81)
RDW: 13.5 % (ref 11.5–15.5)
WBC: 10.5 10*3/uL (ref 4.0–10.5)
nRBC: 0 % (ref 0.0–0.2)

## 2020-10-28 LAB — COMPREHENSIVE METABOLIC PANEL
ALT: 28 U/L (ref 0–44)
AST: 26 U/L (ref 15–41)
Albumin: 4.3 g/dL (ref 3.5–5.0)
Alkaline Phosphatase: 71 U/L (ref 38–126)
Anion gap: 7 (ref 5–15)
BUN: 20 mg/dL (ref 8–23)
CO2: 25 mmol/L (ref 22–32)
Calcium: 8.9 mg/dL (ref 8.9–10.3)
Chloride: 104 mmol/L (ref 98–111)
Creatinine, Ser: 1.22 mg/dL (ref 0.61–1.24)
GFR, Estimated: 60 mL/min — ABNORMAL LOW (ref >60)
Glucose, Bld: 110 mg/dL — ABNORMAL HIGH (ref 70–99)
Potassium: 3.8 mmol/L (ref 3.5–5.1)
Sodium: 136 mmol/L (ref 135–145)
Total Bilirubin: 1 mg/dL (ref 0.3–1.2)
Total Protein: 7 g/dL (ref 6.5–8.1)

## 2020-10-28 LAB — LIPASE, BLOOD: Lipase: 33 U/L (ref 11–51)

## 2020-10-28 MED ORDER — PANTOPRAZOLE SODIUM 40 MG IV SOLR
40.0000 mg | Freq: Once | INTRAVENOUS | Status: AC
  Administered 2020-10-28: 40 mg via INTRAVENOUS
  Filled 2020-10-28: qty 40

## 2020-10-28 MED ORDER — LORAZEPAM 2 MG/ML IJ SOLN
0.5000 mg | Freq: Once | INTRAMUSCULAR | Status: AC
  Administered 2020-10-28: 0.5 mg via INTRAVENOUS
  Filled 2020-10-28: qty 1

## 2020-10-28 MED ORDER — ONDANSETRON HCL 4 MG/2ML IJ SOLN
4.0000 mg | Freq: Once | INTRAMUSCULAR | Status: AC
  Administered 2020-10-28: 4 mg via INTRAVENOUS
  Filled 2020-10-28: qty 2

## 2020-10-28 NOTE — Discharge Instructions (Addendum)

## 2020-10-28 NOTE — ED Provider Notes (Signed)
[facility] EMERGENCY DEPARTMENT Provider Note   CSN: [id number] Arrival date & time: 10/27/20  2327     History Chief Complaint  Patient presents with   Bloated    DHILAN Howe is a 80 y.o. male.  The history is provided by the patient.  Abdominal Pain Pain location:  Epigastric Pain quality: pressure   Pain radiates to:  Chest Pain severity:  Moderate Onset quality:  Gradual Timing:  Intermittent Progression:  Waxing and waning Chronicity:  Recurrent Relieved by:  Belching Worsened by:  Nothing Associated symptoms: no diarrhea, no fever, no shortness of breath and no vomiting   Risk factors: has not had multiple surgeries   Patient with history of hypertension, atrial fibrillation on anticoagulation, tachybradycardia syndrome with PPM presents with abdominal discomfort that radiates into his chest.  He reports this occurred about 2 hours after eating.  No vomiting.  He does report diaphoresis.  He had a similar episode about 1 month ago and was admitted to the hospital with a cardiac work-up.  He reports taking PPI every day with improvement.  He has been med compliant    Past Medical History:  Diagnosis Date   Cancer ([place])    Hypertension    Overweight(278.02)    obesity   Permanent atrial fibrillation (Peter Howe)    Tachycardia-bradycardia [facility])    s/p PPM    Patient Active Problem List   Diagnosis Date Noted   Aortic atherosclerosis (Peter Howe) 06/24/2020   Carpal tunnel syndrome, left upper limb 05/15/2020   Primary insomnia 05/19/2019   Controlled substance agreement signed 05/19/2019   Elevated liver enzymes 06/08/2017   Pacemaker 06/04/2017   Abdominal distension    Abnormal EKG    Gastroesophageal reflux disease 02/05/2016   Essential hypertension with goal blood pressure less than 130/80 09/18/2013   ATRIAL FIBRILLATION 01/02/2010   BMI 33.0-33.9,adult 12/05/2008   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 Nevada Crane   PACEMAKER INSERTION  03/28/03   St. Jude-PPM   PPM GENERATOR CHANGEOUT N/A 11/24/2018   Procedure: PPM GENERATOR CHANGEOUT;  Surgeon: [name], MD;  Location: [place] [facility];  Service: Cardiovascular;  Laterality: N/A;       Family History  Problem Relation Age of Onset   Cancer Mother 60   CAD Father 80   Heart disease Daughter    Stroke Sister    Cancer Sister    Cancer Sister    Diabetes Brother    Cancer Brother    Alcohol abuse Brother     Social History   Tobacco Use   Smoking status: Former    Packs/day: 1.00    Years: 30.00    Pack years: 30.00    Types: Cigarettes    Start date: 03/30/1955    Quit date: 03/09/1993    Years since quitting: 27.6   Smokeless tobacco: Never  Vaping Use   Vaping Use: Never used  Substance Use Topics   Alcohol use: Yes    Alcohol/week: 7.0 standard drinks    Types: 1 Glasses of wine, 4 Cans of beer, 2 Shots of liquor per week   Drug use: No    Home Medications Prior to Admission medications   Medication Sig Start Date End Date Taking? Authorizing Provider  acetaminophen (TYLENOL) 325 MG tablet Take 650 mg by mouth every 6 (six) hours as  needed for mild pain.    [provider]  atenolol (TENORMIN) 100 MG tablet Take 1 tablet (100 mg total) by mouth daily. 04/15/20   Allred, Jeneen Rinks, MD  colchicine 0.6 MG tablet Take 1 tablet (0.6 mg total) by mouth 2 (two) times daily as needed (gout). 08/20/20   Loman Brooklyn, FNP  losartan (COZAAR) 100 MG tablet Take 0.5 tablets (50 mg total) by mouth daily. 10/22/20   Loman Brooklyn, FNP  Multiple Vitamin (MULTIVITAMIN WITH MINERALS) TABS tablet Take 1 tablet by mouth daily.    [provider]  nitroGLYCERIN (NITROSTAT) 0.4 MG SL tablet Place 1 tablet under the tongue every 5 (five) minutes as needed for chest pain. 10/01/20   Allred, Jeneen Rinks, MD  pantoprazole (PROTONIX) 40 MG  tablet Take 1 tablet (40 mg total) by mouth 2 (two) times daily. Take medication twice a day for 30 days; then daily. 09/29/20   Barton Dubois, MD  rivaroxaban (XARELTO) 20 MG TABS tablet Take 1 tablet (20 mg total) by mouth daily with supper. 09/19/20   Allred, Jeneen Rinks, MD  traMADol (ULTRAM) 50 MG tablet Take 1 tablet (50 mg total) by mouth every 8 (eight) hours as needed for moderate pain. 07/11/20   Cherylann Ratel, PA-C  zolpidem (AMBIEN) 5 MG tablet Take 1 tablet (5 mg total) by mouth at bedtime. 10/23/20   Loman Brooklyn, FNP    Allergies    Statins, Morphine and related, Penicillins, and Sulfonamide derivatives  Review of Systems   Review of Systems  Constitutional:  Positive for diaphoresis. Negative for fever.  Respiratory:  Negative for shortness of breath.   Gastrointestinal:  Positive for abdominal pain. Negative for diarrhea and vomiting.  Psychiatric/Behavioral:  The patient is nervous/anxious.   All other systems reviewed and are negative.  Physical Exam Updated Vital Signs BP (!) 187/77   Pulse 83   Temp 98.9 F (37.2 C)   Resp 17   SpO2 94%   Physical Exam CONSTITUTIONAL: Elderly, mildly anxious HEAD: Normocephalic/atraumatic EYES: EOMI/PERRL NECK: supple no meningeal signs SPINE/BACK:entire spine nontender CV: S1/S2 noted, irregular LUNGS: Lungs are clear to auscultation bilaterally, no apparent distress ABDOMEN: soft, mild epigastric tenderness, no rebound or guarding, bowel sounds noted throughout abdomen GU:no cva tenderness NEURO: Pt is awake/alert/appropriate, moves all extremitiesx4.  No facial droop.   EXTREMITIES: pulses normal/equalx4, full ROM SKIN: warm, color normal PSYCH: Anxious ED Results / Procedures / Treatments   Labs (all labs ordered are listed, but only abnormal results are displayed) Labs Reviewed  CBC WITH DIFFERENTIAL/PLATELET - Abnormal; Notable for the following components:      Result Value   Neutro Abs 8.6 (*)    All other  components within normal limits  COMPREHENSIVE METABOLIC PANEL - Abnormal; Notable for the following components:   Glucose, Bld 110 (*)    GFR, Estimated 60 (*)    All other components within normal limits  LIPASE, BLOOD  TROPONIN I (HIGH SENSITIVITY)    EKG EKG Interpretation  Date/Time:  Sunday October 27 2020 23:36:20 EDT Ventricular Rate:  85 PR Interval:    QRS Duration: 88 QT Interval:  347 QTC Calculation: 413 R Axis:   26 Text Interpretation: Atrial fibrillation Ventricular premature complex Nonspecific repol abnormality, diffuse leads Confirmed by Ripley Fraise [phone]) on 10/28/2020 12:11:25 AM  Radiology No results found.  Procedures Procedures   Medications Ordered in ED Medications  LORazepam (ATIVAN) injection 0.5 mg (0.5 mg Intravenous Given 10/28/20 0034)  ondansetron (ZOFRAN)  injection 4 mg (4 mg Intravenous Given 10/28/20 0033)  pantoprazole (PROTONIX) injection 40 mg (40 mg Intravenous Given 10/28/20 0034)    ED Course  I have reviewed the triage vital signs and the nursing notes.  Pertinent labs & imaging results that were available during my care of the patient were reviewed by me and considered in my medical decision making (see chart for details).    MDM Rules/Calculators/A&P                           Patient reports the symptoms he had previously which required admission.  He is requesting some medications for his anxiety.  We will also give Protonix and Zofran. Labs are pending at this time.  There were no acute EKG changes at this time Of note, patient did have recent admission and has had extensive cardiac evaluation 12:53 AM Patient feeling improved, taking ice chips.  Reports he is passing gas and belching which is making him feel improved.  No abdominal pain.  We will continue to monitor Labs thus far reassuring 3:02 AM Patient improved and no further symptoms. Blood pressure and heart rate are appropriate Repeat troponin negative.  He  is requesting discharge. I have low suspicion for ACS or other acute abdominal or cardiopulmonary emergency Final Clinical Impression(s) / ED Diagnoses Final diagnoses:  Abdominal bloating    Rx / DC Orders ED Discharge Orders     None        Ripley Fraise, MD 10/28/20 [phone]

## 2020-10-29 ENCOUNTER — Encounter: Payer: Self-pay | Admitting: Nurse Practitioner

## 2020-10-29 ENCOUNTER — Ambulatory Visit (INDEPENDENT_AMBULATORY_CARE_PROVIDER_SITE_OTHER): Payer: Medicare HMO | Admitting: Nurse Practitioner

## 2020-10-29 DIAGNOSIS — Z91199 Patient's noncompliance with other medical treatment and regimen due to unspecified reason: Secondary | ICD-10-CM

## 2020-10-29 DIAGNOSIS — Z5329 Procedure and treatment not carried out because of patient's decision for other reasons: Secondary | ICD-10-CM | POA: Insufficient documentation

## 2020-10-29 NOTE — Progress Notes (Signed)
Patient did not answer telephone for visit today

## 2020-10-30 ENCOUNTER — Encounter: Payer: Self-pay | Admitting: Family Medicine

## 2020-10-30 ENCOUNTER — Other Ambulatory Visit: Payer: Self-pay

## 2020-10-30 ENCOUNTER — Ambulatory Visit (INDEPENDENT_AMBULATORY_CARE_PROVIDER_SITE_OTHER): Payer: Medicare HMO | Admitting: Family Medicine

## 2020-10-30 VITALS — BP 120/75 | HR 61 | Temp 97.5°F | Ht 64.0 in | Wt 186.2 lb

## 2020-10-30 DIAGNOSIS — F410 Panic disorder [episodic paroxysmal anxiety] without agoraphobia: Secondary | ICD-10-CM | POA: Diagnosis not present

## 2020-10-30 DIAGNOSIS — R69 Illness, unspecified: Secondary | ICD-10-CM | POA: Diagnosis not present

## 2020-10-30 MED ORDER — ALPRAZOLAM 0.25 MG PO TABS: 0.2500 mg | ORAL_TABLET | Freq: Every day | ORAL | 0 refills | Status: DC | PRN

## 2020-10-30 NOTE — Progress Notes (Signed)
Assessment & Plan:  1. Panic attacks Patient understands to only use Xanax when needed for panic attacks. Discussed not taking Xanax and Ambien in the evening; if he requires a Xanax, he will skip the Ambien. He already has a controlled substance agreement in place and urine drug screen on file. PDMP reviewed with no concerning findings. We also discussed if he needs Xanax frequently, he will need to treat anxiety with maintenance medication. Education provided on mindfulness based stress reduction.  - ALPRAZolam (XANAX) 0.25 MG tablet; Take 1 tablet (0.25 mg total) by mouth daily as needed for anxiety.  Dispense: 15 tablet; Refill: 0   Return if symptoms worsen or fail to improve.  Hendricks Limes, MSN, APRN, FNP-C [facility]  Subjective:    Patient ID: Peter Howe, male    DOB: [birth year], 80 y.o.   MRN: [id number]  Patient Care Team: Loman Brooklyn, FNP as PCP - General (Family Medicine) [name], MD as PCP - Electrophysiology (Cardiology)   Chief Complaint:  Chief Complaint  Patient presents with   Panic Attack    Patient states for the last week he has been having panic attacks daily.     HPI: Peter Howe is a 80 y.o. male presenting on 10/30/2020 for Panic Attack (Patient states for the last week he has been having panic attacks daily. )  Patient reports daily panic attacks for the past week. He describes these episodes as feeling very nervous and like things are closing in. He additionally gets sweaty, shaky, short winded, and his heart rate goes up. These episodes last anywhere from a few minutes to an hour. This has occurred in the past 6-7 years ago at which time he was given a prescription for Ativan. He did not need the prescription long and the symptoms stopped occurring.   GAD 7 : Generalized Anxiety Score 10/30/2020 10/23/2020 10/09/2020  Nervous, Anxious, on Edge '1 1 1  ' Control/stop worrying 0 0 0  Worry too much - different things  0 0 0  Trouble relaxing 1 1 0  Restless 1 0 0  Easily annoyed or irritable 1 0 0  Afraid - awful might happen 1 0 0  Total GAD 7 Score '5 2 1  ' Anxiety Difficulty Somewhat difficult Not difficult at all Not difficult at all    New complaints: None   Social history:  Relevant past medical, surgical, family and social history reviewed and updated as indicated. Interim medical history since our last visit reviewed.  Allergies and medications reviewed and updated.  DATA REVIEWED: CHART IN EPIC  ROS: Negative unless specifically indicated above in HPI.    Current Outpatient Medications:    acetaminophen (TYLENOL) 325 MG tablet, Take 650 mg by mouth every 6 (six) hours as needed for mild pain., Disp: , Rfl:    atenolol (TENORMIN) 100 MG tablet, Take 1 tablet (100 mg total) by mouth daily., Disp: 90 tablet, Rfl: 3   colchicine 0.6 MG tablet, Take 1 tablet (0.6 mg total) by mouth 2 (two) times daily as needed (gout)., Disp: 60 tablet, Rfl: 3   losartan (COZAAR) 100 MG tablet, Take 0.5 tablets (50 mg total) by mouth daily., Disp: 45 tablet, Rfl: 0   Multiple Vitamin (MULTIVITAMIN WITH MINERALS) TABS tablet, Take 1 tablet by mouth daily., Disp: , Rfl:    nitroGLYCERIN (NITROSTAT) 0.4 MG SL tablet, Place 1 tablet under the tongue every 5 (five) minutes as needed for chest pain., Disp: 25 tablet,  Rfl: 0   pantoprazole (PROTONIX) 40 MG tablet, Take 1 tablet (40 mg total) by mouth 2 (two) times daily. Take medication twice a day for 30 days; then daily., Disp: 60 tablet, Rfl: 2   rivaroxaban (XARELTO) 20 MG TABS tablet, Take 1 tablet (20 mg total) by mouth daily with supper., Disp: 30 tablet, Rfl: 5   traMADol (ULTRAM) 50 MG tablet, Take 1 tablet (50 mg total) by mouth every 8 (eight) hours as needed for moderate pain., Disp: 30 tablet, Rfl: 0   zolpidem (AMBIEN) 5 MG tablet, Take 1 tablet (5 mg total) by mouth at bedtime., Disp: 30 tablet, Rfl: 5   Allergies  Allergen Reactions   Statins     Morphine And Related Other (See Comments)    Patient states it wired him up and didn't help. Prefers not to take again.   Penicillins Rash    Has patient had a PCN reaction causing immediate rash, facial/tongue/throat swelling, SOB or lightheadedness with hypotension: No Has patient had a PCN reaction causing severe rash involving mucus membranes or skin necrosis: No Has patient had a PCN reaction that required hospitalization No Has patient had a PCN reaction occurring within the last 10 years: No If all of the above answers are "NO", then may proceed with Cephalosporin use.    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 Nevada Crane   PACEMAKER INSERTION  03/28/03   [employer]-PPM   PPM GENERATOR CHANGEOUT N/A 11/24/2018   Procedure: PPM GENERATOR CHANGEOUT;  Surgeon: [name], MD;  Location: [facility];  Service: Cardiovascular;  Laterality: N/A;    Social History   Socioeconomic History   Marital status: Widowed    Spouse name: Not on file   Number of children: 1   Years of education: 12   Highest education level: High school graduate  Occupational History   Occupation: runs a Engineer, technical sales    Comment: part time  Tobacco Use   Smoking status: Former    Packs/day: 1.00    Years: 30.00    Pack years: 30.00    Types: Cigarettes    Start date: 03/30/1955    Quit date: 03/09/1993    Years since quitting: 27.6   Smokeless tobacco: Never  Vaping Use   Vaping Use: Never used  Substance and Sexual Activity   Alcohol use: Yes    Alcohol/week: 7.0 standard drinks    Types: 1 Glasses of wine, 4 Cans of beer, 2 Shots of liquor per week   Drug use: No   Sexual activity: Not  Currently  Other Topics Concern   Not on file  Social History Narrative   Owns a Engineer, technical sales, enjoys golf   Lives in [place] Strain: Not on file  Food Insecurity: Not on file  Transportation Needs: Not on file  Physical Activity: Not on file  Stress: Not on file  Social Connections: Not on file  Intimate Partner Violence: Not on file        Objective:    BP 120/75   Pulse 61   Temp (!)  97.5 F (36.4 C) (Temporal)   Ht '5\' 4"'  (1.626 m)   Wt 186 lb 3.2 oz (84.5 kg)   SpO2 95%   BMI 31.96 kg/m   Wt Readings from Last 3 Encounters:  10/30/20 186 lb 3.2 oz (84.5 kg)  10/23/20 191 lb 9.6 oz (86.9 kg)  10/17/20 190 lb 12.8 oz (86.5 kg)    Physical Exam Vitals reviewed.  Constitutional:      General: He is not in acute distress.    Appearance: Normal appearance. He is obese. He is not ill-appearing, toxic-appearing or diaphoretic.  HENT:     Head: Normocephalic and atraumatic.  Eyes:     General: No scleral icterus.       Right eye: No discharge.        Left eye: No discharge.     Conjunctiva/sclera: Conjunctivae normal.  Cardiovascular:     Rate and Rhythm: Normal rate and regular rhythm.     Heart sounds: Normal heart sounds. No murmur heard.   No friction rub. No gallop.  Pulmonary:     Effort: Pulmonary effort is normal. No respiratory distress.     Breath sounds: Normal breath sounds. No stridor. No wheezing, rhonchi or rales.  Musculoskeletal:        General: Normal range of motion.     Cervical back: Normal range of motion.  Skin:    General: Skin is warm and dry.  Neurological:     Mental Status: He is alert and oriented to person, place, and time. Mental status is at baseline.  Psychiatric:        Mood and Affect: Mood normal.        Behavior: Behavior normal.        Thought Content: Thought content normal.        Judgment: Judgment normal.    Lab Results  Component Value Date   TSH 0.825  04/19/2019   Lab Results  Component Value Date   WBC 10.5 10/27/2020   HGB 15.6 10/27/2020   HCT 46.3 10/27/2020   MCV 99.1 10/27/2020   PLT 157 10/27/2020   Lab Results  Component Value Date   NA 136 10/27/2020   K 3.8 10/27/2020   CO2 25 10/27/2020   GLUCOSE 110 (H) 10/27/2020   BUN 20 10/27/2020   CREATININE 1.22 10/27/2020   BILITOT 1.0 10/27/2020   ALKPHOS 71 10/27/2020   AST 26 10/27/2020   ALT 28 10/27/2020   PROT 7.0 10/27/2020   ALBUMIN 4.3 10/27/2020   CALCIUM 8.9 10/27/2020   ANIONGAP 7 10/27/2020   EGFR 61 10/09/2020   Lab Results  Component Value Date   CHOL 159 09/29/2020   Lab Results  Component Value Date   HDL 67 09/29/2020   Lab Results  Component Value Date   LDLCALC 82 09/29/2020   Lab Results  Component Value Date   TRIG 50 09/29/2020   Lab Results  Component Value Date   CHOLHDL 2.4 09/29/2020   Lab Results  Component Value Date   HGBA1C 5.5 03/26/2016

## 2020-11-05 ENCOUNTER — Ambulatory Visit (INDEPENDENT_AMBULATORY_CARE_PROVIDER_SITE_OTHER): Payer: Medicare HMO

## 2020-11-05 DIAGNOSIS — R001 Bradycardia, unspecified: Secondary | ICD-10-CM

## 2020-11-05 LAB — CUP PACEART REMOTE DEVICE CHECK
Battery Remaining Longevity: 119 mo
Battery Remaining Percentage: 88 %
Battery Voltage: 3.02 V
Brady Statistic RV Percent Paced: 47 %
Date Time Interrogation Session: 20220830051803
Implantable Lead Implant Date: 19980120
Implantable Lead Implant Date: 20050119
Implantable Lead Location: 753859
Implantable Lead Location: 753860
Implantable Pulse Generator Implant Date: 20200917
Lead Channel Impedance Value: 760 Ohm
Lead Channel Pacing Threshold Amplitude: 0.5 V
Lead Channel Pacing Threshold Pulse Width: 0.4 ms
Lead Channel Sensing Intrinsic Amplitude: 12 mV
Lead Channel Setting Pacing Amplitude: 2.5 V
Lead Channel Setting Pacing Pulse Width: 0.4 ms
Lead Channel Setting Sensing Sensitivity: 2 mV
Pulse Gen Model: 2272
Pulse Gen Serial Number: 9154140

## 2020-11-18 ENCOUNTER — Telehealth: Payer: Self-pay | Admitting: Family Medicine

## 2020-11-18 NOTE — Progress Notes (Signed)
Remote pacemaker transmission.   

## 2020-11-18 NOTE — Telephone Encounter (Signed)
  Prescription Request  11/18/2020  Is this a "Controlled Substance" medicine? yes Have you seen your PCP in the last 2 weeks? yes If YES, route message to pool  -  If NO, patient needs to be scheduled for appointment.  What is the name of the medication or equipment? Zanax  Have you contacted your pharmacy to request a refill? no  Which pharmacy would you [facility] this sent [facility]   Patient notified that their request is being sent to the clinical staff for review and that they should receive a response within 2 business days.    Joyce's pt.  He was told to try these & let her know, when he needs refill & how they are doing bc he just got put on them. They only gave 2 weeks trial.  Please call pt.

## 2020-11-19 ENCOUNTER — Other Ambulatory Visit: Payer: Self-pay | Admitting: Family Medicine

## 2020-11-19 ENCOUNTER — Ambulatory Visit (INDEPENDENT_AMBULATORY_CARE_PROVIDER_SITE_OTHER): Payer: Medicare HMO | Admitting: Family Medicine

## 2020-11-19 DIAGNOSIS — F410 Panic disorder [episodic paroxysmal anxiety] without agoraphobia: Secondary | ICD-10-CM

## 2020-11-19 DIAGNOSIS — R69 Illness, unspecified: Secondary | ICD-10-CM | POA: Diagnosis not present

## 2020-11-19 MED ORDER — SERTRALINE HCL 25 MG PO TABS: 25.0000 mg | ORAL_TABLET | Freq: Every day | ORAL | 2 refills | Status: DC

## 2020-11-19 MED ORDER — ALPRAZOLAM 0.25 MG PO TABS: 0.2500 mg | ORAL_TABLET | Freq: Every day | ORAL | 1 refills | Status: DC | PRN

## 2020-11-19 NOTE — Telephone Encounter (Signed)
Lmtcb.

## 2020-11-19 NOTE — Telephone Encounter (Signed)
If he has already gone through all of these, he needs an appointment. He should not be needing a Xanax this frequently. I am okay with this being a telephone visit if he prefers.

## 2020-11-19 NOTE — Telephone Encounter (Signed)
Appointment scheduled.

## 2020-11-19 NOTE — Progress Notes (Signed)
Virtual Visit via Telephone Note  I connected with Peter Howe on 11/19/20 at 3:13 PM by telephone and verified that I am speaking with the correct person using two identifiers. Peter Howe is currently located at home and nobody is currently with him during this visit. The provider, Loman Brooklyn, FNP is located in their office at time of visit.  I discussed the limitations, risks, security and privacy concerns of performing an evaluation and management service by telephone and the availability of in person appointments. I also discussed with the patient that there may be a patient responsible charge related to this service. The patient expressed understanding and agreed to proceed.  Subjective: PCP: Loman Brooklyn, FNP  Chief Complaint  Patient presents with   Anxiety   Patient is following up on panic attacks.  At our last appointment he was given a prescription for Xanax (15 tablets).  This was approximately 3 weeks ago and he has run out.  He states he is needing them mostly at night which is when he is feeling nervous, anxious, and like things are closing in.  He tries deep breathing and exercise prior to taking the medication.  It is helpful when he takes it.  He states sometimes he goes 3 days without needing it, but sometimes he needs it the next day.   ROS: Per HPI  Current Outpatient Medications:    acetaminophen (TYLENOL) 325 MG tablet, Take 650 mg by mouth every 6 (six) hours as needed for mild pain., Disp: , Rfl:    ALPRAZolam (XANAX) 0.25 MG tablet, Take 1 tablet (0.25 mg total) by mouth daily as needed for anxiety., Disp: 15 tablet, Rfl: 0   atenolol (TENORMIN) 100 MG tablet, Take 1 tablet (100 mg total) by mouth daily., Disp: 90 tablet, Rfl: 3   colchicine 0.6 MG tablet, Take 1 tablet (0.6 mg total) by mouth 2 (two) times daily as needed (gout)., Disp: 60 tablet, Rfl: 3   losartan (COZAAR) 100 MG tablet, Take 0.5 tablets (50 mg total) by mouth daily., Disp:  45 tablet, Rfl: 0   Multiple Vitamin (MULTIVITAMIN WITH MINERALS) TABS tablet, Take 1 tablet by mouth daily., Disp: , Rfl:    nitroGLYCERIN (NITROSTAT) 0.4 MG SL tablet, Place 1 tablet under the tongue every 5 (five) minutes as needed for chest pain., Disp: 25 tablet, Rfl: 0   pantoprazole (PROTONIX) 40 MG tablet, Take 1 tablet (40 mg total) by mouth 2 (two) times daily. Take medication twice a day for 30 days; then daily., Disp: 60 tablet, Rfl: 2   rivaroxaban (XARELTO) 20 MG TABS tablet, Take 1 tablet (20 mg total) by mouth daily with supper., Disp: 30 tablet, Rfl: 5   traMADol (ULTRAM) 50 MG tablet, Take 1 tablet (50 mg total) by mouth every 8 (eight) hours as needed for moderate pain., Disp: 30 tablet, Rfl: 0   zolpidem (AMBIEN) 5 MG tablet, Take 1 tablet (5 mg total) by mouth at bedtime., Disp: 30 tablet, Rfl: 5  Allergies  Allergen Reactions   Statins    Morphine And Related Other (See Comments)    Patient states it wired him up and didn't help. Prefers not to take again.   Penicillins Rash    Has patient had a PCN reaction causing immediate rash, facial/tongue/throat swelling, SOB or lightheadedness with hypotension: No Has patient had a PCN reaction causing severe rash involving mucus membranes or skin necrosis: No Has patient had a PCN reaction that required hospitalization  No Has patient had a PCN reaction occurring within the last 10 years: No If all of the above answers are "NO", then may proceed with Cephalosporin use.    Sulfonamide Derivatives Rash   Past Medical History:  Diagnosis Date   Cancer ([place])    Hypertension    Overweight(278.02)    obesity   Permanent atrial fibrillation (H[facility])    Tachycardia-bradycardia ([facility])    s/p PPM    Observations/Objective: A&O  No respiratory distress or wheezing audible over the phone Mood, judgement, and thought processes all WNL   Assessment and Plan: 1. Panic attacks Discussed he cannot take Xanax this frequently for panic  attacks.  I am starting him on sertraline 25 mg once daily to help reduce the frequency of his panic attacks.  A refill was given for Xanax to take as needed, but we had a discussion that this has to decrease. - sertraline (ZOLOFT) 25 MG tablet; Take 1 tablet (25 mg total) by mouth daily.  Dispense: 30 tablet; Refill: 2 - ALPRAZolam (XANAX) 0.25 MG tablet; Take 1 tablet (0.25 mg total) by mouth daily as needed for anxiety.  Dispense: 20 tablet; Refill: 1   Follow Up Instructions: Return in about 6 weeks (around 12/31/2020) for Anxiety.  I discussed the assessment and treatment plan with the patient. The patient was provided an opportunity to ask questions and all were answered. The patient agreed with the plan and demonstrated an understanding of the instructions.   The patient was advised to call back or seek an in-person evaluation if the symptoms worsen or if the condition fails to improve as anticipated.  The above assessment and management plan was discussed with the patient. The patient verbalized understanding of and has agreed to the management plan. Patient is aware to call the clinic if symptoms persist or worsen. Patient is aware when to return to the clinic for a follow-up visit. Patient educated on when it is appropriate to go to the emergency department.   Time call ended: 3:24 PM  I provided 11 minutes of non-face-to-face time during this encounter.  Hendricks Limes, MSN, APRN, FNP-C [facility] 11/19/20

## 2020-11-20 ENCOUNTER — Encounter: Payer: Self-pay | Admitting: Family Medicine

## 2020-12-06 ENCOUNTER — Ambulatory Visit (AMBULATORY_SURGERY_CENTER): Payer: Medicare HMO

## 2020-12-06 ENCOUNTER — Other Ambulatory Visit: Payer: Self-pay

## 2020-12-06 VITALS — Ht 64.0 in | Wt 180.0 lb

## 2020-12-06 DIAGNOSIS — R1013 Epigastric pain: Secondary | ICD-10-CM

## 2020-12-06 DIAGNOSIS — K921 Melena: Secondary | ICD-10-CM

## 2020-12-06 MED ORDER — PLENVU 140 G PO SOLR: 1.0000 | ORAL | 0 refills | Status: DC

## 2020-12-06 NOTE — Progress Notes (Signed)
Pre visit completed via phone call; Patient verified name, DOB, and address; No egg or soy allergy known to patient  No issues known to pt with past sedation with any surgeries or procedures Patient denies ever being told they had issues or difficulty with intubation  No FH of Malignant Hyperthermia Pt is not on diet pills Pt is not on  home 02  Pt is on blood thinners --Xarelto to be held x 2 days prior to procedure Pt denies issues with constipation at this time;  No A flutter / dx with AFIB and currently has a pacemaker EMMI video via MyChart  Pt is fully vaccinated for Covid x 2 + boosters; Coupon given to pt in PV today and NO PA's for preps discussed with pt in PV today  Discussed with pt there will be an out-of-pocket cost for prep and that varies from $0 to 70 +  dollars  Due to the COVID-19 pandemic we are asking patients to follow certain guidelines.  Pt aware of COVID protocols and [facility] guidelines

## 2020-12-09 ENCOUNTER — Encounter: Payer: Self-pay | Admitting: Gastroenterology

## 2020-12-16 DIAGNOSIS — L570 Actinic keratosis: Secondary | ICD-10-CM | POA: Diagnosis not present

## 2020-12-16 DIAGNOSIS — Z08 Encounter for follow-up examination after completed treatment for malignant neoplasm: Secondary | ICD-10-CM | POA: Diagnosis not present

## 2020-12-16 DIAGNOSIS — Z85828 Personal history of other malignant neoplasm of skin: Secondary | ICD-10-CM | POA: Diagnosis not present

## 2020-12-16 DIAGNOSIS — X32XXXD Exposure to sunlight, subsequent encounter: Secondary | ICD-10-CM | POA: Diagnosis not present

## 2020-12-20 ENCOUNTER — Encounter: Payer: Self-pay | Admitting: Gastroenterology

## 2020-12-20 ENCOUNTER — Ambulatory Visit (AMBULATORY_SURGERY_CENTER): Payer: Medicare HMO | Admitting: Gastroenterology

## 2020-12-20 VITALS — BP 138/74 | HR 77 | Temp 97.8°F | Resp 17 | Ht 64.0 in | Wt 180.0 lb

## 2020-12-20 DIAGNOSIS — D122 Benign neoplasm of ascending colon: Secondary | ICD-10-CM

## 2020-12-20 DIAGNOSIS — D123 Benign neoplasm of transverse colon: Secondary | ICD-10-CM | POA: Diagnosis not present

## 2020-12-20 DIAGNOSIS — Z7902 Long term (current) use of antithrombotics/antiplatelets: Secondary | ICD-10-CM | POA: Diagnosis not present

## 2020-12-20 DIAGNOSIS — K2980 Duodenitis without bleeding: Secondary | ICD-10-CM | POA: Diagnosis not present

## 2020-12-20 DIAGNOSIS — K921 Melena: Secondary | ICD-10-CM | POA: Diagnosis not present

## 2020-12-20 DIAGNOSIS — K2970 Gastritis, unspecified, without bleeding: Secondary | ICD-10-CM | POA: Diagnosis not present

## 2020-12-20 DIAGNOSIS — K5730 Diverticulosis of large intestine without perforation or abscess without bleeding: Secondary | ICD-10-CM

## 2020-12-20 DIAGNOSIS — R195 Other fecal abnormalities: Secondary | ICD-10-CM

## 2020-12-20 DIAGNOSIS — D120 Benign neoplasm of cecum: Secondary | ICD-10-CM

## 2020-12-20 DIAGNOSIS — K2981 Duodenitis with bleeding: Secondary | ICD-10-CM | POA: Diagnosis not present

## 2020-12-20 DIAGNOSIS — K2100 Gastro-esophageal reflux disease with esophagitis, without bleeding: Secondary | ICD-10-CM

## 2020-12-20 DIAGNOSIS — K319 Disease of stomach and duodenum, unspecified: Secondary | ICD-10-CM | POA: Diagnosis not present

## 2020-12-20 DIAGNOSIS — K648 Other hemorrhoids: Secondary | ICD-10-CM | POA: Diagnosis not present

## 2020-12-20 DIAGNOSIS — R1013 Epigastric pain: Secondary | ICD-10-CM

## 2020-12-20 DIAGNOSIS — K222 Esophageal obstruction: Secondary | ICD-10-CM

## 2020-12-20 MED ORDER — SODIUM CHLORIDE 0.9 % IV SOLN: 500.0000 mL | Freq: Once | INTRAVENOUS | Status: DC

## 2020-12-20 NOTE — Patient Instructions (Addendum)
No aspirin, aleve or ibuprofen.  Continue your pantoprazole twice daily for 8 weeks, then reduce to 40mg  daily.  You may resume your xarelto Sunday. You will need another colonoscopy in 1 year.  YOU HAD AN ENDOSCOPIC PROCEDURE TODAY AT [facility]:   Refer to the procedure report that was given to you for any specific questions about what was found during the examination.  If the procedure report does not answer your questions, please call your gastroenterologist to clarify.  If you requested that your care partner not be given the details of your procedure findings, then the procedure report has been included in a sealed envelope for you to review at your convenience later.  YOU SHOULD EXPECT: Some feelings of bloating in the abdomen. Passage of more gas than usual.  Walking can help get rid of the air that was put into your GI tract during the procedure and reduce the bloating. If you had a lower endoscopy (such as a colonoscopy or flexible sigmoidoscopy) you may notice spotting of blood in your stool or on the toilet paper. If you underwent a bowel prep for your procedure, you may not have a normal bowel movement for a few days.  Please Note:  You might notice some irritation and congestion in your nose or some drainage.  This is from the oxygen used during your procedure.  There is no need for concern and it should clear up in a day or so.  SYMPTOMS TO REPORT IMMEDIATELY:  Following lower endoscopy (colonoscopy or flexible sigmoidoscopy):  Excessive amounts of blood in the stool  Significant tenderness or worsening of abdominal pains  Swelling of the abdomen that is new, acute  Fever of 100F or higher  Following upper endoscopy (EGD)  Vomiting of blood or coffee ground material  New chest pain or pain under the shoulder blades  Painful or persistently difficult swallowing  New shortness of breath  Fever of 100F or higher  Black, tarry-looking stools  For urgent or  emergent issues, a gastroenterologist can be reached at any hour by calling [phone]. Do not use MyChart messaging for urgent concerns.    DIET:  We do recommend a small meal at first, but then you may proceed to your regular diet.  Drink plenty of fluids but you should avoid alcoholic beverages for 24 hours.  ACTIVITY:  You should plan to take it easy for the rest of today and you should NOT DRIVE or use heavy machinery until tomorrow (because of the sedation medicines used during the test).    FOLLOW UP: Our staff will call the number listed on your records 48-72 hours following your procedure to check on you and address any questions or concerns that you may have regarding the information given to you following your procedure. If we do not reach you, we will leave a message.  We will attempt to reach you two times.  During this call, we will ask if you have developed any symptoms of COVID 19. If you develop any symptoms (ie: fever, flu-like symptoms, shortness of breath, cough etc.) before then, please call [phone].  If you test positive for Covid 19 in the 2 weeks post procedure, please call and report this information to [place].    If any biopsies were taken you will be contacted by phone or by letter within the next 1-3 weeks.  Please call us at [phone] if you have not heard about the biopsies in 3 weeks.  SIGNATURES/CONFIDENTIALITY: You and/or your care partner have signed paperwork which will be entered into your electronic medical record.  These signatures attest to the fact that that the information above on your After Visit Summary has been reviewed and is understood.  Full responsibility of the confidentiality of this discharge information lies with you and/or your care-partner.

## 2020-12-20 NOTE — Progress Notes (Signed)
Pt Drowsy. VSS. To PACU, report to RN. No anesthetic complications noted.  

## 2020-12-20 NOTE — Progress Notes (Signed)
Called to room to assist during endoscopic procedure.  Patient ID and intended procedure confirmed with present staff. Received instructions for my participation in the procedure from the performing physician.  

## 2020-12-20 NOTE — Op Note (Signed)
[facility] Patient Name: Peter Howe Procedure Date: 12/20/2020 9:40 AM MRN: [id number] Endoscopist: Thornton Park MD, MD Age: 80 Referring MD:  Date of Birth: 1941-02-06 Gender: Male Account #: [id number] Procedure:                Colonoscopy Indications:              Heme positive stool                           FOBT + 06/11/20 without anemia or overt                           History of colon polyps                           - tubular adenoma seen on pathology reports from                            2000, 2003, and 2006                           Possible family history of colon cancer (mother) Medicines:                Monitored Anesthesia Care Procedure:                Pre-Anesthesia Assessment:                           - Prior to the procedure, a History and Physical                            was performed, and patient medications and                            allergies were reviewed. The patient's tolerance of                            previous anesthesia was also reviewed. The risks                            and benefits of the procedure and the sedation                            options and risks were discussed with the patient.                            All questions were answered, and informed consent                            was obtained. Prior Anticoagulants: The patient has                            taken Xarelto (rivaroxaban), last dose was 5 days  prior to procedure. ASA Grade Assessment: III - A                            patient with severe systemic disease. After                            reviewing the risks and benefits, the patient was                            deemed in satisfactory condition to undergo the                            procedure.                           After obtaining informed consent, the colonoscope                            was passed under direct vision. Throughout the                             procedure, the patient's blood pressure, pulse, and                            oxygen saturations were monitored continuously. The                            CF [id number] #[id number] was introduced through the anus                            and advanced to the 2 cm into the ileum. A second                            forward view of the right colon was performed. The                            colonoscopy was performed without difficulty. The                            patient tolerated the procedure well. The quality                            of the bowel preparation was good. The terminal                            ileum, ileocecal valve, appendiceal orifice, and                            rectum were photographed. Scope In: 10:09:40 AM Scope Out: 10:29:30 AM Scope Withdrawal Time: 0 hours 17 minutes 45 seconds  Total Procedure Duration: 0 hours 19 minutes 50 seconds  Findings:                 The perianal and digital rectal examinations were  normal.                           Non-bleeding external and internal hemorrhoids were                            found.                           Multiple small and large-mouthed diverticula were                            found in the entire colon.                           A 3 mm polyp was found in the splenic flexure. The                            polyp was sessile. The polyp was removed with a                            cold snare. Resection and retrieval were complete.                            Estimated blood loss was minimal.                           Two sessile polyps were found in the transverse                            colon. The polyps were 2 to 3 mm in size. These                            polyps were removed with a cold snare. Resection                            and retrieval were complete. Estimated blood loss                            was minimal.                           Two sessile polyps  were found in the hepatic                            flexure. The polyps were 2 to 3 mm in size. These                            polyps were removed with a cold snare. Resection                            and retrieval were complete. Estimated blood loss  was minimal.                           Three sessile polyps were found in the ascending                            colon. The polyps were 2 to 5 mm in size. These                            polyps were removed with a cold snare. Resection                            and retrieval were complete. Estimated blood loss                            was minimal.                           Three sessile polyps were found in the cecum. The                            polyps were 1 to 3 mm in size. These polyps were                            removed with a cold snare. Resection and retrieval                            were complete. Estimated blood loss was minimal.                           The colon is redundant. The exam was otherwise                            without abnormality on direct and retroflexion                            views. Complications:            No immediate complications. Estimated blood loss:                            Minimal. Estimated Blood Loss:     Estimated blood loss was minimal. Impression:               - Non-bleeding external and internal hemorrhoids.                           - Diverticulosis in the entire examined colon.                           - One 3 mm polyp at the splenic flexure, removed                            with a cold snare. Resected and retrieved.                           -  Two 2 to 3 mm polyps in the transverse colon,                            removed with a cold snare. Resected and retrieved.                           - Two 2 to 3 mm polyps at the hepatic flexure,                            removed with a cold snare. Resected and retrieved.                            - Three 2 to 5 mm polyps in the ascending colon,                            removed with a cold snare. Resected and retrieved.                           - Three 1 to 3 mm polyps in the cecum, removed with                            a cold snare. Resected and retrieved.                           - The examination was otherwise normal on direct                            and retroflexion views. Recommendation:           - Patient has a contact number available for                            emergencies. The signs and symptoms of potential                            delayed complications were discussed with the                            patient. Return to normal activities tomorrow.                            Written discharge instructions were provided to the                            patient.                           - High fiber diet.                           - Continue present medications.                           - Resume Xarelto (rivaroxaban) at prior dose in  2                            days.                           - Await pathology results.                           - Repeat colonoscopy in 1 year for surveillance if                            it is clinically appropriate at that time. Will                            schedule an office visit to discuss in one year.                           - Emerging evidence supports eating a diet of                            fruits, vegetables, grains, calcium, and yogurt                            while reducing red meat and alcohol may reduce the                            risk of colon cancer. Thornton Park MD, MD 12/20/2020 10:44:58 AM This report has been signed electronically.

## 2020-12-20 NOTE — Progress Notes (Signed)
Pt's states no medical or surgical changes since previsit or office visit. 

## 2020-12-20 NOTE — Progress Notes (Signed)
Referring Provider: Loman Brooklyn, FNP Primary Care Physician:  Loman Brooklyn, FNP  Indication for procedures: Blood in the stool   IMPRESSION:  Blood in the stool - FOBT + 06/11/20 without anemia or overt bleeding Recent nausea, heartburn, and diarrhea followed by constipation    - improved on PPI therapy Chronic use of Xarelto History of colon polyps    - tubular adenoma seen on pathology reports from 2000, 2003, and 2006 Possible family history of colon cancer (mother) Appropriate candidate for monitored anesthesia care in the [facility]    PLAN: EGD and Colonoscopy   HPI: Peter Howe is a 80 y.o. male who presents for endoscopic evaluation of blood in the stool.  The history is obtained through the patient and review of his electronic health record. He has a history of hypertension, atrial fibrillation on Xarelto, and tachy-brady with PPM. EF of 60-65% on echo 2018.  Seen by his primary care provider for a telehealth visit in March for indigestion, nausea, and diarrhea that has been occurring intermittently for a month.  There was associated eructation and flatus.  Imodium was controlling his diarrhea but he preferred not to do that. Since starting Prilosec and Pepcid at that time he is feeling better, although now having some constipation.  He completed 30 days of Pepcid and now remains on daily Prilsoec with control of his symptoms. There has been no overt bleeding or abdominal pain.   Labs 04/24/2020 showing normal comprehensive metabolic panel with a normal CBC except for an MCV of 98. Stool was fecal occult blood + 06/11/2020 A stool culture was ordered but does not appear to have been submitted Labs 10/27/20: normal CBC  He has had multiple colonoscopy, he thinks the last was 5 years ago with Dr. Amedeo Plenty at [place] [place]. He has a a history of tubular adenomas.  I see pathology results from 2000, 2003, and 2006 documenting tubular adenomas. The patient reports a prior endoscopy many  years ago but it was long enough ago that he can't remember it.   Abdominal imaging with a CT of the abdomen and pelvis with contrast 03/30/2017 to evaluate intermittent epigastric pain and tenderness for 2 weeks showed no acute findings.  There was diverticulosis without diverticulitis and a small right inguinal hernia.  He thinks his mother probably had colon cancer at age 68, but, they did not know the specific diagnosis at that time. No other known family history of colon cancer or polyps. No family history of uterine/endometrial cancer, pancreatic cancer or gastric/stomach cancer.   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 Tawanna Solo TUNNEL RELEASE Left 2022   [facility]  03/28/2003   [employer]-PPM   PPM GENERATOR CHANGEOUT N/A 11/24/2018   Procedure: PPM GENERATOR CHANGEOUT;  Surgeon: [name], MD;  Location: [facility];  Service: Cardiovascular;  Laterality: N/A;  Current Outpatient Medications  Medication Sig Dispense Refill   acetaminophen (TYLENOL) 325 MG tablet Take 650 mg by mouth every 6 (six) hours as needed for mild pain.     ALPRAZolam (XANAX) 0.25 MG tablet Take 1 tablet (0.25 mg total) by mouth daily as needed for anxiety. 20 tablet 1   atenolol (TENORMIN) 100 MG tablet Take 1 tablet (100 mg total) by mouth daily. 90 tablet 3   colchicine 0.6 MG tablet Take 1 tablet (0.6 mg total) by mouth 2 (two) times daily as needed (gout). 60 tablet 3   losartan (COZAAR) 100 MG tablet Take 0.5 tablets  (50 mg total) by mouth daily. 45 tablet 0   Multiple Vitamin (MULTIVITAMIN WITH MINERALS) TABS tablet Take 1 tablet by mouth daily.     nitroGLYCERIN (NITROSTAT) 0.4 MG SL tablet Place 1 tablet under the tongue every 5 (five) minutes as needed for chest pain. 25 tablet 0   pantoprazole (PROTONIX) 40 MG tablet Take 1 tablet (40 mg total) by mouth 2 (two) times daily. Take medication twice a day for 30 days; then daily. (Patient not taking: Reported on 12/06/2020) 60 tablet 2   PEG-KCl-NaCl-NaSulf-Na Asc-C (PLENVU) 140 g SOLR Take 1 kit by mouth as directed. 1 each 0   rivaroxaban (XARELTO) 20 MG TABS tablet Take 1 tablet (20 mg total) by mouth daily with supper. 30 tablet 5   sertraline (ZOLOFT) 25 MG tablet Take 1 tablet (25 mg total) by mouth daily. 30 tablet 2   traMADol (ULTRAM) 50 MG tablet Take 1 tablet (50 mg total) by mouth every 8 (eight) hours as needed for moderate pain. 30 tablet 0   zolpidem (AMBIEN) 5 MG tablet Take 1 tablet (5 mg total) by mouth at bedtime. 30 tablet 5   Current Facility-Administered Medications  Medication Dose Route Frequency Provider Last Rate Last Admin   0.9 %  sodium chloride infusion  500 mL Intravenous Once Thornton Park, MD        Allergies as of 12/20/2020 - Review Complete 12/20/2020  Allergen Reaction Noted   Statins  10/23/2020   Morphine and related Other (See Comments) 10/09/2020   Penicillins Rash    Sulfonamide derivatives Rash 11/08/2009    Family History  Problem Relation Age of Onset   Cancer Mother 79       unknown location   CAD Father 62   Breast cancer Sister    Breast cancer Sister    Stroke Sister    Breast cancer Sister    Breast cancer Sister    Diabetes Brother    Cancer Brother    Alcohol abuse Brother    Heart disease Daughter    Colon polyps Neg Hx    Colon cancer Neg Hx    Esophageal cancer Neg Hx    Stomach cancer Neg Hx    Rectal cancer Neg Hx      Physical Exam: General:   Alert,  well-nourished,  pleasant and cooperative in NAD Head:  Normocephalic and atraumatic. Eyes:  Sclera clear, no icterus.   Conjunctiva pink. Ears:  Normal auditory acuity. Nose:  No deformity, discharge,  or lesions. Mouth:  No deformity or lesions.   Neck:  Supple; no masses or thyromegaly. Lungs:  Clear throughout to auscultation.   No wheezes. Heart:  Irregularly irregula; no murmurs. Abdomen:  Soft, central obesity, nontender, nondistended, normal bowel sounds, no rebound or guarding. No hepatosplenomegaly.   Rectal:  Deferred  Msk:  Symmetrical. No boney deformities LAD:  No inguinal or umbilical LAD Extremities:  No clubbing or edema. Neurologic:  Alert and  oriented x4;  grossly nonfocal Skin:  Intact without significant lesions or rashes. Psych:  Alert and cooperative. Normal mood and affect.     Peter Kau L. Tarri Glenn, MD, MPH 12/20/2020, 9:44 AM

## 2020-12-20 NOTE — Op Note (Signed)
[facility] Patient Name: Peter Howe Procedure Date: 12/20/2020 9:46 AM MRN: [id number] Endoscopist: Thornton Park MD, MD Age: 80 Referring MD:  Date of Birth: [birth year] Gender: Male Account #: [id number] Procedure:                Upper GI endoscopy Indications:              Heme positive stool                           FOBT + 06/11/20 without anemia or overt bleeding                           Recent nausea, heartburn, and diarrhea followed by                            constipation Medicines:                Monitored Anesthesia Care Procedure:                Pre-Anesthesia Assessment:                           - Prior to the procedure, a History and Physical                            was performed, and patient medications and                            allergies were reviewed. The patient's tolerance of                            previous anesthesia was also reviewed. The risks                            and benefits of the procedure and the sedation                            options and risks were discussed with the patient.                            All questions were answered, and informed consent                            was obtained. Prior Anticoagulants: The patient has                            taken Xarelto (rivaroxaban), last dose was 5 days                            prior to procedure. ASA Grade Assessment: III - A                            patient with severe systemic disease. After  reviewing the risks and benefits, the patient was                            deemed in satisfactory condition to undergo the                            procedure.                           After obtaining informed consent, the endoscope was                            passed under direct vision. Throughout the                            procedure, the patient's blood pressure, pulse, and                            oxygen saturations were  monitored continuously. The                            GIF [id number] #[id number] was introduced through the                            mouth, and advanced to the third part of duodenum.                            The upper GI endoscopy was accomplished without                            difficulty. The patient tolerated the procedure                            well. Scope In: Scope Out: Findings:                 A widely patent Schatzki ring was found in the                            lower third of the esophagus.                           LA Grade A (one or more mucosal breaks less than 5                            mm, not extending between tops of 2 mucosal folds)                            esophagitis with no bleeding was found 36 cm from                            the incisors. Biopsies were taken with a cold  forceps for histology. Estimated blood loss was                            minimal.                           Diffuse mild inflammation characterized by                            erythema, friability and granularity was found in                            the gastric body. Biopsies were taken from the                            antrum, body, and fundus with a cold forceps for                            histology. Estimated blood loss was minimal.                           A few erosions without bleeding were found in the                            duodenal bulb. Biopsies were taken with a cold                            forceps for histology. Estimated blood loss was                            minimal.                           The cardia and gastric fundus were normal on                            retroflexion. Complications:            No immediate complications. Estimated blood loss:                            Minimal. Estimated Blood Loss:     Estimated blood loss was minimal. Impression:               - Widely patent Schatzki ring.                            - LA Grade A esophagitis with no bleeding. Biopsied.                           - Gastritis. Biopsied.                           - Duodenal erosions without bleeding. Biopsied. Recommendation:           - Patient has a contact number available for  emergencies. The signs and symptoms of potential                            delayed complications were discussed with the                            patient. Return to normal activities tomorrow.                            Written discharge instructions were provided to the                            patient.                           - Resume previous diet.                           - Continue present medications. Resume pantoprazole                            40 mg BID x 8 weeks, then reduce to 40 mg daily.                           - Await pathology results.                           - No aspirin, ibuprofen, naproxen, or other                            non-steroidal anti-inflammatory drugs.                           - Proceed with colonoscopy as scheduled. Thornton Park MD, MD 12/20/2020 10:35:56 AM This report has been signed electronically.

## 2020-12-23 ENCOUNTER — Other Ambulatory Visit: Payer: Self-pay

## 2020-12-23 DIAGNOSIS — R1013 Epigastric pain: Secondary | ICD-10-CM

## 2020-12-23 MED ORDER — PANTOPRAZOLE SODIUM 40 MG PO TBEC: 40.0000 mg | DELAYED_RELEASE_TABLET | Freq: Two times a day (BID) | ORAL | 0 refills | Status: DC

## 2020-12-23 MED ORDER — PANTOPRAZOLE SODIUM 40 MG PO TBEC: 40.0000 mg | DELAYED_RELEASE_TABLET | Freq: Every day | ORAL | 3 refills | Status: DC

## 2020-12-24 ENCOUNTER — Telehealth: Payer: Self-pay

## 2020-12-24 NOTE — Telephone Encounter (Signed)
Attempted f/u call back. No answer, left VM. 

## 2020-12-24 NOTE — Telephone Encounter (Signed)
  Follow up Call-  Call back number 12/20/2020  Post procedure Call Back phone  # [phone]  Permission to leave phone message Yes  Some recent data might be hidden     Patient questions:  Do you have a fever, pain , or abdominal swelling? No. Pain Score  0 *  Have you tolerated food without any problems? Yes.    Have you been able to return to your normal activities? Yes.    Do you have any questions about your discharge instructions: Diet   No. Medications  No. Follow up visit  No.  Do you have questions or concerns about your Care? No.  Actions: * If pain score is 4 or above: No action needed, pain <4.  Have you developed a fever since your procedure? No  2.   Have you had an respiratory symptoms (SOB or cough) since your procedure? no  3.   Have you tested positive for COVID 19 since your procedure no  4.   Have you had any family members/close contacts diagnosed with the COVID 19 since your procedure?  no   If yes to any of these questions please route to Joylene John, RN and Joella Prince, RN

## 2020-12-26 DIAGNOSIS — R319 Hematuria, unspecified: Secondary | ICD-10-CM | POA: Diagnosis not present

## 2020-12-26 DIAGNOSIS — R1031 Right lower quadrant pain: Secondary | ICD-10-CM | POA: Diagnosis not present

## 2020-12-26 DIAGNOSIS — K5792 Diverticulitis of intestine, part unspecified, without perforation or abscess without bleeding: Secondary | ICD-10-CM | POA: Diagnosis not present

## 2020-12-29 ENCOUNTER — Encounter: Payer: Self-pay | Admitting: Gastroenterology

## 2020-12-31 ENCOUNTER — Ambulatory Visit (INDEPENDENT_AMBULATORY_CARE_PROVIDER_SITE_OTHER): Payer: Medicare HMO | Admitting: Family Medicine

## 2020-12-31 ENCOUNTER — Encounter: Payer: Self-pay | Admitting: Family Medicine

## 2020-12-31 ENCOUNTER — Other Ambulatory Visit: Payer: Self-pay

## 2020-12-31 VITALS — BP 136/80 | HR 72 | Temp 98.6°F | Resp 20 | Ht 64.0 in | Wt 185.0 lb

## 2020-12-31 DIAGNOSIS — R69 Illness, unspecified: Secondary | ICD-10-CM | POA: Diagnosis not present

## 2020-12-31 DIAGNOSIS — F410 Panic disorder [episodic paroxysmal anxiety] without agoraphobia: Secondary | ICD-10-CM | POA: Diagnosis not present

## 2020-12-31 NOTE — Progress Notes (Signed)
Assessment & Plan:  1. Panic attacks Well controlled on current regimen. Continue Zoloft 25 mg daily and Xanax as needed. Patient understands he cannot take Xanax and Ambien both in the same evening, so if he needs the Xanax he has to skip the Ambien. He already has a controlled substance agreement in place and urine drug screen on file. PDMP reviewed with no concerning findings.  Return as scheduled.  Hendricks Limes, MSN, APRN, FNP-C [facility]  Subjective:    Patient ID: Peter Howe, male    DOB: 01-11-1941, 80 y.o.   MRN: [id number]  Patient Care Team: Loman Brooklyn, FNP as PCP - General (Family Medicine) [name], MD as PCP - Electrophysiology (Cardiology)   Chief Complaint:  Chief Complaint  Patient presents with   6 week recheck anxiety    HPI: Peter Howe is a 80 y.o. male presenting on 12/31/2020 for 6 week recheck anxiety  Patient previously reported daily panic attacks. He described the episodes as feeling very nervous and like things are closing in. He additionally was getting sweaty, shaky, short winded, and his heart rate went up. These episodes lasted anywhere from a few minutes to an hour. This occurred in the past and subsided.   He was given Xanax to take for the panic attacks and started on sertraline 25 mg daily to decrease anxiety and hopefully the panic attacks. He reports today he has only needed the Xanax twice since starting on Zoloft.   GAD 7 : Generalized Anxiety Score 12/31/2020 10/30/2020 10/23/2020 10/09/2020  Nervous, Anxious, on Edge 0 _0 Control/stop worrying 0 0 0 0  Worry too much - different things 0 0 0 0  Trouble relaxing 0 1 1 0  Restless 0 1 0 0  Easily annoyed or irritable 0 1 0 0  Afraid - awful might happen 0 1 0 0  Total GAD 7 Score 0 _1 Anxiety Difficulty - Somewhat difficult Not difficult at all Not difficult at all    New complaints: None   Social history:  Relevant past  medical, surgical, family and social history reviewed and updated as indicated. Interim medical history since our last visit reviewed.  Allergies and medications reviewed and updated.  DATA REVIEWED: CHART IN EPIC  ROS: Negative unless specifically indicated above in HPI.    Current Outpatient Medications:    acetaminophen (TYLENOL) 325 MG tablet, Take 650 mg by mouth every 6 (six) hours as needed for mild pain., Disp: , Rfl:    ALPRAZolam (XANAX) 0.25 MG tablet, Take 1 tablet (0.25 mg total) by mouth daily as needed for anxiety., Disp: 20 tablet, Rfl: 1   atenolol (TENORMIN) 100 MG tablet, Take 1 tablet (100 mg total) by mouth daily., Disp: 90 tablet, Rfl: 3   ciprofloxacin (CIPRO) 500 MG tablet, Take by mouth., Disp: , Rfl:    colchicine 0.6 MG tablet, Take 1 tablet (0.6 mg total) by mouth 2 (two) times daily as needed (gout)., Disp: 60 tablet, Rfl: 3   losartan (COZAAR) 100 MG tablet, Take 0.5 tablets (50 mg total) by mouth daily., Disp: 45 tablet, Rfl: 0   metroNIDAZOLE (FLAGYL) 500 MG tablet, Take one po bid x 10  days, Disp: , Rfl:    Multiple Vitamin (MULTIVITAMIN WITH MINERALS) TABS tablet, Take 1 tablet by mouth daily., Disp: , Rfl:    nitroGLYCERIN (NITROSTAT) 0.4 MG SL tablet, Place 1 tablet under the tongue every 5 (five)  minutes as needed for chest pain., Disp: 25 tablet, Rfl: 0   pantoprazole (PROTONIX) 40 MG tablet, Take 1 tablet (40 mg total) by mouth 2 (two) times daily. Take po BID x 8 weeks, then reduce., Disp: 112 tablet, Rfl: 0   [START ON 02/17/2021] pantoprazole (PROTONIX) 40 MG tablet, Take 1 tablet (40 mg total) by mouth daily., Disp: 90 tablet, Rfl: 3   rivaroxaban (XARELTO) 20 MG TABS tablet, Take 1 tablet (20 mg total) by mouth daily with supper., Disp: 30 tablet, Rfl: 5   sertraline (ZOLOFT) 25 MG tablet, Take 1 tablet (25 mg total) by mouth daily., Disp: 30 tablet, Rfl: 2   traMADol (ULTRAM) 50 MG tablet, Take 1 tablet (50 mg total) by mouth every 8 (eight) hours  as needed for moderate pain., Disp: 30 tablet, Rfl: 0   zolpidem (AMBIEN) 5 MG tablet, Take 1 tablet (5 mg total) by mouth at bedtime., Disp: 30 tablet, Rfl: 5   Allergies  Allergen Reactions   Statins    Morphine And Related Other (See Comments)    Patient states it wired him up and didn't help. Prefers not to take again.   Penicillins Rash    Has patient had a PCN reaction causing immediate rash, facial/tongue/throat swelling, SOB or lightheadedness with hypotension: No Has patient had a PCN reaction causing severe rash involving mucus membranes or skin necrosis: No Has patient had a PCN reaction that required hospitalization No Has patient had a PCN reaction occurring within the last 10 years: No If all of the above answers are "NO", then may proceed with Cephalosporin use.    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 Tawanna Solo TUNNEL RELEASE Left 2022   [place]  03/28/2003   St. Jude-PPM   PPM GENERATOR CHANGEOUT N/A 11/24/2018   Procedure: PPM GENERATOR CHANGEOUT;  Surgeon: [name], MD;  Location: [facility];  Service: Cardiovascular;  Laterality: N/A;    Social History   Socioeconomic History   Marital status: Widowed    Spouse name: Not on file   Number of children: 1   Years of education: 12   Highest education level: High school graduate  Occupational History   Occupation: runs a Engineer, technical sales    Comment: part time  Tobacco Use   Smoking status:  Former    Packs/day: 1.00    Years: 30.00    Pack years: 30.00    Types: Cigarettes    Start date: 03/30/1955    Quit date: 03/09/1993    Years since quitting: 27.8   Smokeless tobacco: Never  Vaping Use   Vaping Use: Never used  Substance and Sexual Activity   Alcohol use: Not Currently    Alcohol/week: 7.0 standard drinks    Types: 1 Glasses of wine, 4 Cans of beer, 2 Shots of liquor per  week   Drug use: No   Sexual activity: Not Currently  Other Topics Concern   Not on file  Social History Narrative   Owns a Engineer, technical sales, enjoys golf   Lives in [place] Strain: Not on file  Food Insecurity: Not on file  Transportation Needs: Not on file  Physical Activity: Not on file  Stress: Not on file  Social Connections: Not on file  Intimate Partner Violence: Not on file        Objective:    BP 136/80   Pulse 72   Temp 98.6 F (37 C) (Temporal)   Resp 20   Ht _0  (1.626 m)   Wt 185 lb (83.9 kg)   SpO2 97%   BMI 31.76 kg/m   Wt Readings from Last 3 Encounters:  12/31/20 185 lb (83.9 kg)  12/20/20 180 lb (81.6 kg)  12/06/20 180 lb (81.6 kg)    Physical Exam Vitals reviewed.  Constitutional:      General: He is not in acute distress.    Appearance: Normal appearance. He is obese. He is not ill-appearing, toxic-appearing or diaphoretic.  HENT:     Head: Normocephalic and atraumatic.  Eyes:     General: No scleral icterus.       Right eye: No discharge.        Left eye: No discharge.     Conjunctiva/sclera: Conjunctivae normal.  Cardiovascular:     Rate and Rhythm: Normal rate and regular rhythm.     Heart sounds: Normal heart sounds. No murmur heard.   No friction rub. No gallop.  Pulmonary:     Effort: Pulmonary effort is normal. No respiratory distress.     Breath sounds: Normal breath sounds. No stridor. No wheezing, rhonchi or rales.  Musculoskeletal:        General: Normal range of motion.      Cervical back: Normal range of motion.  Skin:    General: Skin is warm and dry.  Neurological:     Mental Status: He is alert and oriented to person, place, and time. Mental status is at baseline.  Psychiatric:        Mood and Affect: Mood normal.        Behavior: Behavior normal.        Thought Content: Thought content normal.        Judgment: Judgment normal.    Lab Results  Component Value Date   TSH 0.825 04/19/2019   Lab Results  Component Value Date   WBC 10.5 10/27/2020   HGB 15.6 10/27/2020   HCT 46.3 10/27/2020   MCV 99.1 10/27/2020   PLT 157 10/27/2020   Lab Results  Component Value Date   NA 136 10/27/2020   K 3.8 10/27/2020   CO2 25 10/27/2020   GLUCOSE 110 (H) 10/27/2020   BUN 20 10/27/2020   CREATININE 1.22 10/27/2020   BILITOT 1.0 10/27/2020   ALKPHOS 71 10/27/2020   AST 26 10/27/2020   ALT 28 10/27/2020   PROT 7.0 10/27/2020   ALBUMIN 4.3 10/27/2020   CALCIUM 8.9 10/27/2020   ANIONGAP 7 10/27/2020   EGFR 61 10/09/2020   Lab Results  Component Value Date   CHOL 159 09/29/2020   Lab Results  Component Value Date   HDL 67 09/29/2020   Lab Results  Component Value Date   LDLCALC 82 09/29/2020   Lab Results  Component Value Date   TRIG  50 09/29/2020   Lab Results  Component Value Date   CHOLHDL 2.4 09/29/2020   Lab Results  Component Value Date   HGBA1C 5.5 03/26/2016

## 2021-01-02 ENCOUNTER — Other Ambulatory Visit: Payer: Self-pay | Admitting: Family Medicine

## 2021-01-02 DIAGNOSIS — I10 Essential (primary) hypertension: Secondary | ICD-10-CM

## 2021-01-05 ENCOUNTER — Encounter: Payer: Self-pay | Admitting: Family Medicine

## 2021-01-10 ENCOUNTER — Other Ambulatory Visit: Payer: Self-pay

## 2021-01-10 ENCOUNTER — Emergency Department (HOSPITAL_COMMUNITY): Payer: Medicare HMO

## 2021-01-10 ENCOUNTER — Emergency Department (HOSPITAL_COMMUNITY)
Admission: EM | Admit: 2021-01-10 | Discharge: 2021-01-10 | Disposition: A | Discharge status: Home / Self Care | Payer: Medicare HMO | Attending: Emergency Medicine | Admitting: Emergency Medicine

## 2021-01-10 ENCOUNTER — Encounter (HOSPITAL_COMMUNITY): Payer: Self-pay

## 2021-01-10 DIAGNOSIS — Z87891 Personal history of nicotine dependence: Secondary | ICD-10-CM | POA: Diagnosis not present

## 2021-01-10 DIAGNOSIS — K4020 Bilateral inguinal hernia, without obstruction or gangrene, not specified as recurrent: Secondary | ICD-10-CM | POA: Diagnosis not present

## 2021-01-10 DIAGNOSIS — R1031 Right lower quadrant pain: Secondary | ICD-10-CM | POA: Diagnosis not present

## 2021-01-10 DIAGNOSIS — Z950 Presence of cardiac pacemaker: Secondary | ICD-10-CM | POA: Insufficient documentation

## 2021-01-10 DIAGNOSIS — Z85828 Personal history of other malignant neoplasm of skin: Secondary | ICD-10-CM | POA: Insufficient documentation

## 2021-01-10 DIAGNOSIS — K4090 Unilateral inguinal hernia, without obstruction or gangrene, not specified as recurrent: Secondary | ICD-10-CM | POA: Diagnosis not present

## 2021-01-10 DIAGNOSIS — R1032 Left lower quadrant pain: Secondary | ICD-10-CM | POA: Diagnosis not present

## 2021-01-10 DIAGNOSIS — I4891 Unspecified atrial fibrillation: Secondary | ICD-10-CM | POA: Diagnosis not present

## 2021-01-10 DIAGNOSIS — K219 Gastro-esophageal reflux disease without esophagitis: Secondary | ICD-10-CM | POA: Diagnosis not present

## 2021-01-10 DIAGNOSIS — K5790 Diverticulosis of intestine, part unspecified, without perforation or abscess without bleeding: Secondary | ICD-10-CM

## 2021-01-10 DIAGNOSIS — R109 Unspecified abdominal pain: Secondary | ICD-10-CM | POA: Diagnosis not present

## 2021-01-10 DIAGNOSIS — Z7901 Long term (current) use of anticoagulants: Secondary | ICD-10-CM | POA: Insufficient documentation

## 2021-01-10 DIAGNOSIS — I10 Essential (primary) hypertension: Secondary | ICD-10-CM | POA: Insufficient documentation

## 2021-01-10 DIAGNOSIS — R1084 Generalized abdominal pain: Secondary | ICD-10-CM

## 2021-01-10 DIAGNOSIS — K439 Ventral hernia without obstruction or gangrene: Secondary | ICD-10-CM | POA: Diagnosis not present

## 2021-01-10 DIAGNOSIS — Z79899 Other long term (current) drug therapy: Secondary | ICD-10-CM | POA: Insufficient documentation

## 2021-01-10 HISTORY — DX: Diverticulosis of intestine, part unspecified, without perforation or abscess without bleeding: K57.90

## 2021-01-10 HISTORY — DX: Bilateral inguinal hernia, without obstruction or gangrene, not specified as recurrent: K40.20

## 2021-01-10 LAB — CBC WITH DIFFERENTIAL/PLATELET
Abs Immature Granulocytes: 0.04 10*3/uL (ref 0.00–0.07)
Basophils Absolute: 0 10*3/uL (ref 0.0–0.1)
Basophils Relative: 0 %
Eosinophils Absolute: 0.1 10*3/uL (ref 0.0–0.5)
Eosinophils Relative: 1 %
HCT: 46.2 % (ref 39.0–52.0)
Hemoglobin: 15.8 g/dL (ref 13.0–17.0)
Immature Granulocytes: 0 %
Lymphocytes Relative: 8 %
Lymphs Abs: 0.9 10*3/uL (ref 0.7–4.0)
MCH: 32.9 pg (ref 26.0–34.0)
MCHC: 34.2 g/dL (ref 30.0–36.0)
MCV: 96.3 fL (ref 80.0–100.0)
Monocytes Absolute: 0.5 10*3/uL (ref 0.1–1.0)
Monocytes Relative: 5 %
Neutro Abs: 10.1 10*3/uL — ABNORMAL HIGH (ref 1.7–7.7)
Neutrophils Relative %: 86 %
Platelets: 225 10*3/uL (ref 150–400)
RBC: 4.8 MIL/uL (ref 4.22–5.81)
RDW: 13 % (ref 11.5–15.5)
WBC: 11.6 10*3/uL — ABNORMAL HIGH (ref 4.0–10.5)
nRBC: 0 % (ref 0.0–0.2)

## 2021-01-10 LAB — URINALYSIS, ROUTINE W REFLEX MICROSCOPIC
Bacteria, UA: NONE SEEN
Bilirubin Urine: NEGATIVE
Glucose, UA: NEGATIVE mg/dL
Ketones, ur: NEGATIVE mg/dL
Leukocytes,Ua: NEGATIVE
Nitrite: NEGATIVE
Protein, ur: NEGATIVE mg/dL
Specific Gravity, Urine: 1.015 (ref 1.005–1.030)
pH: 6 (ref 5.0–8.0)

## 2021-01-10 LAB — COMPREHENSIVE METABOLIC PANEL
ALT: 24 U/L (ref 0–44)
AST: 29 U/L (ref 15–41)
Albumin: 4.2 g/dL (ref 3.5–5.0)
Alkaline Phosphatase: 67 U/L (ref 38–126)
Anion gap: 9 (ref 5–15)
BUN: 10 mg/dL (ref 8–23)
CO2: 25 mmol/L (ref 22–32)
Calcium: 9.3 mg/dL (ref 8.9–10.3)
Chloride: 102 mmol/L (ref 98–111)
Creatinine, Ser: 1.05 mg/dL (ref 0.61–1.24)
GFR, Estimated: >60 mL/min (ref >60)
Glucose, Bld: 142 mg/dL — ABNORMAL HIGH (ref 70–99)
Potassium: 4.3 mmol/L (ref 3.5–5.1)
Sodium: 136 mmol/L (ref 135–145)
Total Bilirubin: 0.9 mg/dL (ref 0.3–1.2)
Total Protein: 6.9 g/dL (ref 6.5–8.1)

## 2021-01-10 LAB — LIPASE, BLOOD: Lipase: 27 U/L (ref 11–51)

## 2021-01-10 MED ORDER — ONDANSETRON 4 MG PO TBDP: 4.0000 mg | ORAL_TABLET | Freq: Three times a day (TID) | ORAL | 0 refills | Status: DC | PRN

## 2021-01-10 MED ORDER — DICYCLOMINE HCL 20 MG PO TABS: 20.0000 mg | ORAL_TABLET | Freq: Two times a day (BID) | ORAL | 0 refills | Status: DC

## 2021-01-10 MED ORDER — LORAZEPAM 2 MG/ML IJ SOLN
1.0000 mg | Freq: Once | INTRAMUSCULAR | Status: AC
  Administered 2021-01-10: 1 mg via INTRAVENOUS
  Filled 2021-01-10: qty 1

## 2021-01-10 MED ORDER — IOHEXOL 350 MG/ML SOLN
80.0000 mL | Freq: Once | INTRAVENOUS | Status: AC | PRN
  Administered 2021-01-10: 80 mL via INTRAVENOUS

## 2021-01-10 NOTE — ED Provider Notes (Signed)
Emergency Medicine Provider Triage Evaluation Note  Peter Howe , a 80 y.o. male  was evaluated in triage.  Pt complains of abdominal pain.  Is been going on the last couple weeks, worsened today.  He has some epigastric pain, states the abdominal pain is primarily to the right lower quadrant and right upper quadrant.  Does not radiate to the back, he had a bowel movement today.  Denies any vomiting but does endorse nausea.  No prior abdominal surgeries..  Review of Systems  Positive: Abdominal pain, abdominal distention, nausea Negative: Vomiting, constipation  Physical Exam  BP (!) 173/95 (BP Location: Left Arm)   Pulse (!) 59   Temp 98 F (36.7 C) (Oral)   Resp 18   Ht 5\' 4"  (1.626 m)   Wt 83.9 kg   SpO2 100%   BMI 31.76 kg/m  Gen:   Awake, no distress   Resp:  Normal effort  MSK:   Moves extremities without difficulty  Other:  Protuberant abdomen, right lower quadrant tenderness as well as epigastric tenderness.  Medical Decision Making  Medically screening exam initiated at 5:12 PM.  Appropriate orders placed.  Peter Howe was informed that the remainder of the evaluation will be completed by another provider, this initial triage assessment does not replace that evaluation, and the importance of remaining in the ED until their evaluation is complete.  Abdominal labs.  Last abdominal CT was over a year ago, given the acute nature of this problem and his age I do think it is reasonable to get a CT   Sherrill Raring, Hershal Coria 01/10/21 1713    Drenda Freeze, MD 01/10/21 [id number]

## 2021-01-10 NOTE — ED Notes (Signed)
Comm pad not working in room. Pt received DC papers and expressed understanding of instructions.

## 2021-01-10 NOTE — ED Notes (Signed)
Patient is requesting medication for sedation for ct. Explained to patient that it is open. He states he has jumped off a ct table due to anxiety of ct scan.

## 2021-01-10 NOTE — ED Triage Notes (Signed)
Pt sent from urgent care for abdominal pain. States he was seen 3 weeks ago for same and treated with anbx.  Pt states he had recurrence of abdominal pain radiating to groin and a "knot" has appeared in his groin area just today that he feels is a "rupture"  Nausea, no vomiting. No fevers.

## 2021-01-10 NOTE — ED Provider Notes (Signed)
[place] DEPT Provider Note   CSN: [id number] Arrival date & time: 01/10/21  1635     History Chief Complaint  Patient presents with   Abdominal Pain    Peter Howe is a 80 y.o. male.  HPI      80 year old male with history of atrial fibrillation, hypertension, hyperlipidemia, tachybradycardia syndrome status post pacemaker, GERD, presents with concern for abdominal pain. Was golfing today and suddenly developed severe diffuse abdominal pain with radiation to groin. Had a knot appear in his right groin today.  Nausea, no vomiting, no fevers, no urinary symptoms. Pain severe. Has had prior episodes of pain, came to ED end of august with same. Has been told that it might be passed kidney stone.  Pain was severe. Had 4 BM this AM, last was diarrhea. Has not passed flatus since this AM.  Past Medical History:  Diagnosis Date   Anxiety    on meds   Arthritis    generalized   Cancer ([place])    skin cancers removed   GERD (gastroesophageal reflux disease)    on meds   Hyperlipidemia    on meds   Hypertension    on meds   Overweight(278.02)    obesity   Permanent atrial fibrillation (HCC)    Seasonal allergies    Tachycardia-bradycardia [facility])    s/p PPM    Patient Active Problem List   Diagnosis Date Noted   Aortic atherosclerosis ([place]) 06/24/2020   Carpal tunnel syndrome, left upper limb 05/15/2020   Primary insomnia 05/19/2019   Controlled substance agreement signed 05/19/2019   Elevated liver enzymes 06/08/2017   Pacemaker 06/04/2017   Abdominal distension    Abnormal EKG    Gastroesophageal reflux disease 02/05/2016   Essential hypertension with goal blood pressure less than 130/80 09/18/2013   ATRIAL FIBRILLATION 01/02/2010   BMI 33.0-33.9,adult 12/05/2008   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 Tawanna Solo TUNNEL RELEASE Left 2022   [place]  03/28/2003   St. Jude-PPM   PPM GENERATOR CHANGEOUT N/A 11/24/2018   Procedure: PPM GENERATOR CHANGEOUT;  Surgeon: [name], MD;  Location: [facility];  Service: Cardiovascular;  Laterality: N/A;       Family History  Problem Relation Age of Onset   Cancer Mother 55       unknown location   CAD Father 23   Breast cancer Sister    Breast cancer Sister    Stroke Sister    Breast cancer Sister    Breast cancer Sister    Diabetes Brother    Cancer Brother    Alcohol abuse Brother    Heart disease Daughter    Colon polyps Neg Hx    Colon cancer Neg Hx    Esophageal cancer Neg Hx    Stomach cancer Neg Hx    Rectal cancer Neg Hx     Social History   Tobacco Use   Smoking status: Former    Packs/day: 1.00    Years: 30.00    Pack years: 30.00    Types: Cigarettes    Start date: 03/30/1955    Quit date: 03/09/1993    Years since quitting: 27.8   Smokeless tobacco: Never  Vaping Use   Vaping Use: Never used  Substance Use Topics   Alcohol use: Not  Currently    Alcohol/week: 7.0 standard drinks    Types: 1 Glasses of wine, 4 Cans of beer, 2 Shots of liquor per week   Drug use: No    Home Medications Prior to Admission medications   Medication Sig Start Date End Date Taking? Authorizing Provider  dicyclomine (BENTYL) 20 MG tablet Take 1 tablet (20 mg total) by mouth 2 (two) times daily. 01/10/21  Yes Gareth Morgan, MD  ondansetron (ZOFRAN ODT) 4 MG disintegrating tablet Take 1 tablet (4 mg total) by mouth every 8 (eight) hours as needed for nausea or vomiting. 01/10/21  Yes Gareth Morgan, MD  acetaminophen (TYLENOL) 325 MG tablet Take 650 mg by mouth every 6 (six) hours as needed for mild pain.    [provider]  ALPRAZolam Duanne Moron) 0.25 MG tablet Take 1 tablet (0.25 mg total) by mouth daily as needed for anxiety. 11/19/20   Loman Brooklyn, FNP  atenolol (TENORMIN) 100 MG tablet  Take 1 tablet (100 mg total) by mouth daily. 04/15/20   Allred, Jeneen Rinks, MD  colchicine 0.6 MG tablet Take 1 tablet (0.6 mg total) by mouth 2 (two) times daily as needed (gout). 08/20/20   Loman Brooklyn, FNP  losartan (COZAAR) 100 MG tablet Take 0.5 tablets (50 mg total) by mouth daily. 01/03/21   Loman Brooklyn, FNP  metroNIDAZOLE (FLAGYL) 500 MG tablet Take one po bid x 10  days 12/26/20   [provider]  Multiple Vitamin (MULTIVITAMIN WITH MINERALS) TABS tablet Take 1 tablet by mouth daily.    [provider]  nitroGLYCERIN (NITROSTAT) 0.4 MG SL tablet Place 1 tablet under the tongue every 5 (five) minutes as needed for chest pain. 10/01/20   Allred, Jeneen Rinks, MD  pantoprazole (PROTONIX) 40 MG tablet Take 1 tablet (40 mg total) by mouth 2 (two) times daily. Take po BID x 8 weeks, then reduce. 12/23/20 02/17/21  Thornton Park, MD  pantoprazole (PROTONIX) 40 MG tablet Take 1 tablet (40 mg total) by mouth daily. 02/17/21   Thornton Park, MD  rivaroxaban (XARELTO) 20 MG TABS tablet Take 1 tablet (20 mg total) by mouth daily with supper. 09/19/20   Allred, Jeneen Rinks, MD  sertraline (ZOLOFT) 25 MG tablet Take 1 tablet (25 mg total) by mouth daily. 11/19/20   Loman Brooklyn, FNP  traMADol (ULTRAM) 50 MG tablet Take 1 tablet (50 mg total) by mouth every 8 (eight) hours as needed for moderate pain. 07/11/20   Cherylann Ratel, PA-C  zolpidem (AMBIEN) 5 MG tablet Take 1 tablet (5 mg total) by mouth at bedtime. 10/23/20   Loman Brooklyn, FNP    Allergies    Statins, Morphine and related, Penicillins, and Sulfonamide derivatives  Review of Systems   Review of Systems  Constitutional:  Negative for fever.  HENT:  Negative for sore throat.   Eyes:  Negative for visual disturbance.  Respiratory:  Negative for cough and shortness of breath.   Cardiovascular:  Negative for chest pain.  Gastrointestinal:  Positive for abdominal distention, abdominal pain, diarrhea and nausea. Negative for  constipation and vomiting.  Genitourinary:  Negative for difficulty urinating and dysuria.  Musculoskeletal:  Negative for back pain and neck stiffness.  Skin:  Negative for rash.  Neurological:  Negative for syncope and headaches.   Physical Exam Updated Vital Signs BP 128/78   Pulse 77   Temp 98 F (36.7 C) (Oral)   Resp 20   Ht 5\' 4"  (1.626 m)   Wt 83.9  kg   SpO2 94%   BMI 31.76 kg/m   Physical Exam Vitals and nursing note reviewed.  Constitutional:      General: He is not in acute distress.    Appearance: He is well-developed. He is not diaphoretic.  HENT:     Head: Normocephalic and atraumatic.  Eyes:     Conjunctiva/sclera: Conjunctivae normal.  Cardiovascular:     Rate and Rhythm: Normal rate and regular rhythm.  Pulmonary:     Effort: Pulmonary effort is normal. No respiratory distress.  Abdominal:     General: There is distension.     Palpations: Abdomen is soft.     Tenderness: There is generalized abdominal tenderness. There is no guarding.  Musculoskeletal:     Cervical back: Normal range of motion.  Skin:    General: Skin is warm and dry.  Neurological:     Mental Status: He is alert and oriented to person, place, and time.    ED Results / Procedures / Treatments   Labs (all labs ordered are listed, but only abnormal results are displayed) Labs Reviewed  CBC WITH DIFFERENTIAL/PLATELET - Abnormal; Notable for the following components:      Result Value   WBC 11.6 (*)    Neutro Abs 10.1 (*)    All other components within normal limits  COMPREHENSIVE METABOLIC PANEL - Abnormal; Notable for the following components:   Glucose, Bld 142 (*)    All other components within normal limits  URINALYSIS, ROUTINE W REFLEX MICROSCOPIC - Abnormal; Notable for the following components:   Color, Urine STRAW (*)    Hgb urine dipstick SMALL (*)    All other components within normal limits  LIPASE, BLOOD    EKG None  Radiology CT Abdomen Pelvis W  Contrast  Result Date: 01/10/2021 CLINICAL DATA:  Right lower quadrant abdominal pain. EXAM: CT ABDOMEN AND PELVIS WITH CONTRAST TECHNIQUE: Multidetector CT imaging of the abdomen and pelvis was performed using the standard protocol following bolus administration of intravenous contrast. CONTRAST:  59mL OMNIPAQUE IOHEXOL 350 MG/ML SOLN COMPARISON:  March 30, 2017 FINDINGS: Lower chest: No acute abnormality. Hepatobiliary: No focal liver abnormality is seen. The gallbladder is contracted. No gallstones, gallbladder wall thickening, or biliary dilatation. Pancreas: Unremarkable. No pancreatic ductal dilatation or surrounding inflammatory changes. Spleen: Normal in size without focal abnormality. Adrenals/Urinary Tract: Adrenal glands are unremarkable. Kidneys are normal, without renal calculi, focal lesion, or hydronephrosis. Bladder is unremarkable. Stomach/Bowel: Stomach is within normal limits. Appendix appears normal. No evidence of bowel wall thickening, distention, or inflammatory changes. Noninflamed diverticula are seen throughout the descending colon. Vascular/Lymphatic: Aortic atherosclerosis. No enlarged abdominal or pelvic lymph nodes. Reproductive: There is mild to moderate severity prostate gland enlargement. Other: There is a 6.2 cm x 3.7 cm fat containing right inguinal hernia. A small amount of fluid is also seen. A 3.2 cm x 2.2 cm fat containing left inguinal hernia is noted. Musculoskeletal: Multilevel degenerative changes seen throughout the lumbar spine. IMPRESSION: 1. Normal appendix. 2. Colonic diverticulosis. 3. Bilateral fat-containing inguinal hernias, right larger than left. 4. Enlarged prostate gland. 5. Aortic atherosclerosis. Aortic Atherosclerosis (ICD10-I70.0). Electronically Signed   By: Virgina Norfolk M.D.   On: 01/10/2021 20:38    Procedures Procedures   Medications Ordered in ED Medications  LORazepam (ATIVAN) injection 1 mg (1 mg Intravenous Given 01/10/21 1942)   iohexol (OMNIPAQUE) 350 MG/ML injection 80 mL (80 mLs Intravenous Contrast Given 01/10/21 2009)    ED Course  I have reviewed  the triage vital signs and the nursing notes.  Pertinent labs & imaging results that were available during my care of the patient were reviewed by me and considered in my medical decision making (see chart for details).    MDM Rules/Calculators/A&P                           80 year old male with history of atrial fibrillation an xarelto, hypertension, hyperlipidemia, tachybradycardia syndrome status post pacemaker, GERD, presents with concern for abdominal pain. DDx includes appendicitis, pancreatitis, cholecystitis, pyelonephritis, nephrolithiasis, diverticulitis, AAA, mesenteric ischemia, incarcerated hernia.   CT shows fat containing hernias.  Given pain located in these locations, not localized to RUQ, doubt symptomatic cholelithiasis, taking anticoagulation, doubt mesenteric ischemia.  UA without infection.  Suspect pain related to fat containing hernias, also possible pain related to viral symptoms with diarrhea this AM.  Recommend follow up with PCP, general surgery.  Given bentyl and zofran rx. Patient discharged in stable condition with understanding of reasons to return.   Final Clinical Impression(s) / ED Diagnoses Final diagnoses:  Generalized abdominal pain  Bilateral inguinal hernia without obstruction or gangrene, recurrence not specified    Rx / DC Orders ED Discharge Orders          Ordered    dicyclomine (BENTYL) 20 MG tablet  2 times daily        01/10/21 2159    ondansetron (ZOFRAN ODT) 4 MG disintegrating tablet  Every 8 hours PRN        01/10/21 2159             Gareth Morgan, MD 01/11/21 1109

## 2021-01-17 ENCOUNTER — Encounter: Payer: Self-pay | Admitting: Family Medicine

## 2021-01-17 ENCOUNTER — Other Ambulatory Visit: Payer: Self-pay

## 2021-01-17 ENCOUNTER — Ambulatory Visit (INDEPENDENT_AMBULATORY_CARE_PROVIDER_SITE_OTHER): Payer: Medicare HMO | Admitting: Family Medicine

## 2021-01-17 VITALS — BP 108/69 | HR 60 | Temp 97.6°F | Ht 64.0 in | Wt 176.8 lb

## 2021-01-17 DIAGNOSIS — K5790 Diverticulosis of intestine, part unspecified, without perforation or abscess without bleeding: Secondary | ICD-10-CM

## 2021-01-17 DIAGNOSIS — K4020 Bilateral inguinal hernia, without obstruction or gangrene, not specified as recurrent: Secondary | ICD-10-CM | POA: Diagnosis not present

## 2021-01-17 DIAGNOSIS — Z23 Encounter for immunization: Secondary | ICD-10-CM | POA: Diagnosis not present

## 2021-01-17 NOTE — Progress Notes (Signed)
Assessment & Plan:  1. Non-recurrent bilateral inguinal hernia without obstruction or gangrene - Ambulatory referral to General Surgery  2. Diverticulosis Education provided on diverticulosis.  3. Need for immunization against influenza - Flu Vaccine QUAD High Dose(Fluad)   Hendricks Limes, MSN, APRN, FNP-C [facility]  Subjective:    Patient ID: Peter Howe, male    DOB: [birth year], 80 y.o.   MRN: [id number]  Patient Care Team: Loman Brooklyn, FNP as PCP - General (Family Medicine) [name], MD as PCP - Electrophysiology (Cardiology)   Chief Complaint:  Chief Complaint  Patient presents with   ER follow up    01/10/21 [facility]    HPI: Peter Howe is a 80 y.o. male presenting on 01/17/2021 for ER follow up (01/10/21 [facility])  Patient was seen at [facility] on 01/10/2021 for abdominal pain that radiated to his groin. CT abd/pelvis with the following impression: 1. Normal appendix.  2. Colonic diverticulosis.  3. Bilateral fat-containing inguinal hernias, right larger than left.  4. Enlarged prostate gland.  5. Aortic atherosclerosis.  He was discharged with Bentyl and Zofran and advised to follow-up with PCP and general surgery.   He denies any pain since that time but states he has been to the ER multiple times with the same complaint and he is still not sure what is causing it.   New complaints: None  Social history:  Relevant past medical, surgical, family and social history reviewed and updated as indicated. Interim medical history since our last visit reviewed.  Allergies and medications reviewed and updated.  DATA REVIEWED: CHART IN EPIC  ROS: Negative unless specifically indicated above in HPI.    Current Outpatient Medications:    acetaminophen (TYLENOL) 325 MG tablet, Take 650 mg by mouth every 6 (six) hours as needed for mild pain., Disp: , Rfl:    ALPRAZolam (XANAX) 0.25 MG tablet, Take 1 tablet (0.25 mg total) by  mouth daily as needed for anxiety., Disp: 20 tablet, Rfl: 1   atenolol (TENORMIN) 100 MG tablet, Take 1 tablet (100 mg total) by mouth daily., Disp: 90 tablet, Rfl: 3   colchicine 0.6 MG tablet, Take 1 tablet (0.6 mg total) by mouth 2 (two) times daily as needed (gout)., Disp: 60 tablet, Rfl: 3   dicyclomine (BENTYL) 20 MG tablet, Take 1 tablet (20 mg total) by mouth 2 (two) times daily., Disp: 20 tablet, Rfl: 0   HYDROcodone-acetaminophen (NORCO/VICODIN) 5-325 MG tablet, Take by mouth., Disp: , Rfl:    losartan (COZAAR) 100 MG tablet, Take 0.5 tablets (50 mg total) by mouth daily., Disp: 45 tablet, Rfl: 0   Multiple Vitamin (MULTIVITAMIN WITH MINERALS) TABS tablet, Take 1 tablet by mouth daily., Disp: , Rfl:    nitroGLYCERIN (NITROSTAT) 0.4 MG SL tablet, Place 1 tablet under the tongue every 5 (five) minutes as needed for chest pain., Disp: 25 tablet, Rfl: 0   pantoprazole (PROTONIX) 40 MG tablet, Take 1 tablet (40 mg total) by mouth 2 (two) times daily. Take po BID x 8 weeks, then reduce., Disp: 112 tablet, Rfl: 0   [START ON 02/17/2021] pantoprazole (PROTONIX) 40 MG tablet, Take 1 tablet (40 mg total) by mouth daily., Disp: 90 tablet, Rfl: 3   rivaroxaban (XARELTO) 20 MG TABS tablet, Take 1 tablet (20 mg total) by mouth daily with supper., Disp: 30 tablet, Rfl: 5   sertraline (ZOLOFT) 25 MG tablet, Take 1 tablet (25 mg total) by mouth daily., Disp: 30 tablet, Rfl:  2   traMADol (ULTRAM) 50 MG tablet, Take 1 tablet (50 mg total) by mouth every 8 (eight) hours as needed for moderate pain., Disp: 30 tablet, Rfl: 0   zolpidem (AMBIEN) 5 MG tablet, Take 1 tablet (5 mg total) by mouth at bedtime., Disp: 30 tablet, Rfl: 5   Allergies  Allergen Reactions   Statins    Morphine And Related Other (See Comments)    Patient states it wired him up and didn't help. Prefers not to take again.   Penicillins Rash    Has patient had a PCN reaction causing immediate rash, facial/tongue/throat swelling, SOB or  lightheadedness with hypotension: No Has patient had a PCN reaction causing severe rash involving mucus membranes or skin necrosis: No Has patient had a PCN reaction that required hospitalization No Has patient had a PCN reaction occurring within the last 10 years: No If all of the above answers are "NO", then may proceed with Cephalosporin use.    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 Tawanna Solo TUNNEL RELEASE Left 2022   [place]  03/28/2003   St. Jude-PPM   PPM GENERATOR CHANGEOUT N/A 11/24/2018   Procedure: PPM GENERATOR CHANGEOUT;  Surgeon: [name], MD;  Location: [facility];  Service: Cardiovascular;  Laterality: N/A;    Social History   Socioeconomic History   Marital status: Widowed    Spouse name: Not on file   Number of children: 1   Years of education: 12   Highest education level: High school graduate  Occupational History   Occupation: runs a Engineer, technical sales    Comment: part time  Tobacco Use   Smoking status: Former    Packs/day: 1.00    Years: 30.00    Pack years: 30.00    Types: Cigarettes    Start date: 03/30/1955    Quit date: 03/09/1993    Years since quitting: 27.8   Smokeless tobacco: Never  Vaping Use   Vaping Use: Never used  Substance and Sexual Activity   Alcohol use: Not Currently    Alcohol/week: 7.0 standard drinks    Types: 1 Glasses of wine, 4 Cans of beer, 2 Shots of liquor per week   Drug use: No   Sexual activity: Not  Currently  Other Topics Concern   Not on file  Social History Narrative   Owns a Engineer, technical sales, enjoys golf   Lives in [place] Strain: Not on file  Food Insecurity: Not on file  Transportation Needs: Not on file  Physical Activity: Not on file  Stress: Not on file  Social Connections: Not on file  Intimate Partner Violence: Not on file        Objective:    BP 108/69   Pulse 60   Temp 97.6 F (36.4  C) (Temporal)   Ht '5\' 4"'  (1.626 m)   Wt 176 lb 12.8 oz (80.2 kg)   BMI 30.35 kg/m   Wt Readings from Last 3 Encounters:  01/17/21 176 lb 12.8 oz (80.2 kg)  01/10/21 185 lb (83.9 kg)  12/31/20 185 lb (83.9 kg)    Physical Exam Vitals reviewed.  Constitutional:      General: He is not in acute distress.    Appearance: Normal appearance. He is obese. He is not ill-appearing, toxic-appearing or diaphoretic.  HENT:     Head: Normocephalic and atraumatic.  Eyes:     General: No scleral icterus.       Right eye: No discharge.        Left eye: No discharge.     Conjunctiva/sclera: Conjunctivae normal.  Cardiovascular:     Rate and Rhythm: Normal rate.  Pulmonary:     Effort: Pulmonary effort is normal. No respiratory distress.  Musculoskeletal:        General: Normal range of motion.     Cervical back: Normal range of motion.  Skin:    General: Skin is warm and dry.  Neurological:     Mental Status: He is alert and oriented to person, place, and time. Mental status is at baseline.  Psychiatric:        Mood and Affect: Mood normal.        Behavior: Behavior normal.        Thought Content: Thought content normal.        Judgment: Judgment normal.    Lab Results  Component Value Date   TSH 0.825 04/19/2019   Lab Results  Component Value Date   WBC 11.6 (H) 01/10/2021   HGB 15.8 01/10/2021   HCT 46.2 01/10/2021   MCV 96.3 01/10/2021   PLT 225 01/10/2021   Lab Results  Component Value Date   NA 136  01/10/2021   K 4.3 01/10/2021   CO2 25 01/10/2021   GLUCOSE 142 (H) 01/10/2021   BUN 10 01/10/2021   CREATININE 1.05 01/10/2021   BILITOT 0.9 01/10/2021   ALKPHOS 67 01/10/2021   AST 29 01/10/2021   ALT 24 01/10/2021   PROT 6.9 01/10/2021   ALBUMIN 4.2 01/10/2021   CALCIUM 9.3 01/10/2021   ANIONGAP 9 01/10/2021   EGFR 61 10/09/2020   Lab Results  Component Value Date   CHOL 159 09/29/2020   Lab Results  Component Value Date   HDL 67 09/29/2020   Lab Results  Component Value Date   LDLCALC 82 09/29/2020   Lab Results  Component Value Date   TRIG 50 09/29/2020   Lab Results  Component Value Date   CHOLHDL 2.4 09/29/2020   Lab Results  Component Value Date   HGBA1C 5.5 03/26/2016

## 2021-02-04 ENCOUNTER — Ambulatory Visit: Payer: Medicare HMO | Admitting: General Surgery

## 2021-02-04 ENCOUNTER — Other Ambulatory Visit: Payer: Self-pay | Admitting: Family Medicine

## 2021-02-04 ENCOUNTER — Ambulatory Visit (INDEPENDENT_AMBULATORY_CARE_PROVIDER_SITE_OTHER): Payer: Medicare HMO

## 2021-02-04 DIAGNOSIS — I495 Sick sinus syndrome: Secondary | ICD-10-CM | POA: Diagnosis not present

## 2021-02-04 DIAGNOSIS — F410 Panic disorder [episodic paroxysmal anxiety] without agoraphobia: Secondary | ICD-10-CM

## 2021-02-04 LAB — CUP PACEART REMOTE DEVICE CHECK
Battery Remaining Longevity: 115 mo
Battery Remaining Percentage: 87 %
Battery Voltage: 3.02 V
Brady Statistic RV Percent Paced: 51 %
Date Time Interrogation Session: 20221129020011
Implantable Lead Implant Date: 19980120
Implantable Lead Implant Date: 20050119
Implantable Lead Location: 753859
Implantable Lead Location: 753860
Implantable Pulse Generator Implant Date: 20200917
Lead Channel Impedance Value: 690 Ohm
Lead Channel Pacing Threshold Amplitude: 0.5 V
Lead Channel Pacing Threshold Pulse Width: 0.4 ms
Lead Channel Sensing Intrinsic Amplitude: 12 mV
Lead Channel Setting Pacing Amplitude: 2.5 V
Lead Channel Setting Pacing Pulse Width: 0.4 ms
Lead Channel Setting Sensing Sensitivity: 2 mV
Pulse Gen Model: 2272
Pulse Gen Serial Number: 9154140

## 2021-02-13 ENCOUNTER — Encounter: Payer: Self-pay | Admitting: General Surgery

## 2021-02-13 ENCOUNTER — Other Ambulatory Visit: Payer: Self-pay

## 2021-02-13 ENCOUNTER — Ambulatory Visit: Payer: Medicare HMO | Admitting: General Surgery

## 2021-02-13 VITALS — BP 126/83 | HR 68 | Temp 98.7°F | Resp 14 | Ht 64.0 in | Wt 176.0 lb

## 2021-02-13 DIAGNOSIS — K4020 Bilateral inguinal hernia, without obstruction or gangrene, not specified as recurrent: Secondary | ICD-10-CM | POA: Diagnosis not present

## 2021-02-13 NOTE — Progress Notes (Signed)
Remote pacemaker transmission.   

## 2021-02-14 NOTE — Progress Notes (Signed)
Peter Howe; [id number]; Nov 01, 1940   HPI Patient is an 80 year old white male who was referred for evaluation and treatment of bilateral inguinal hernias.  He states he has had no hernias for several months.  They do not bother him too much.  The right side seems to bother him more than the left when he is straining.  It is not affecting his daily lifestyle right now.  He has multiple comorbidities including atrial fibrillation, chronic anticoagulation on Xarelto, pacemaker placement. 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 Tawanna Solo TUNNEL RELEASE Left 2022   [place]  03/28/2003   St. Jude-PPM   PPM GENERATOR CHANGEOUT N/A 11/24/2018   Procedure: PPM GENERATOR CHANGEOUT;  Surgeon: [name], MD;  Location: [facility];  Service: Cardiovascular;  Laterality: N/A;    Family History  Problem Relation Age of Onset   Cancer Mother 48       unknown location   CAD Father 32   Breast cancer Sister    Breast cancer Sister    Stroke Sister    Breast cancer Sister    Breast cancer Sister    Diabetes Brother    Cancer Brother    Alcohol abuse Brother    Heart disease Daughter    Colon polyps Neg Hx    Colon cancer Neg Hx    Esophageal cancer Neg Hx    Stomach cancer Neg Hx    Rectal cancer Neg Hx     Current Outpatient Medications on File Prior to Visit  Medication Sig Dispense Refill    acetaminophen (TYLENOL) 325 MG tablet Take 650 mg by mouth every 6 (six) hours as needed for mild pain.     ALPRAZolam (XANAX) 0.25 MG tablet Take 1 tablet (0.25 mg total) by mouth daily as needed for anxiety. 20 tablet 1   atenolol (TENORMIN) 100 MG tablet Take 1 tablet (100 mg total) by mouth daily. 90 tablet 3   colchicine 0.6 MG tablet Take 1 tablet (0.6 mg total) by mouth 2 (two) times daily as needed (gout). 60 tablet 3   dicyclomine (BENTYL) 20 MG tablet Take 1 tablet (20 mg total) by mouth 2 (two) times daily. 20 tablet 0   HYDROcodone-acetaminophen (NORCO/VICODIN) 5-325 MG tablet Take by mouth.     losartan (COZAAR) 100 MG tablet Take 0.5 tablets (50 mg total) by mouth daily. 45 tablet 0   Multiple Vitamin (MULTIVITAMIN WITH MINERALS) TABS tablet Take 1 tablet by mouth daily.     nitroGLYCERIN (NITROSTAT) 0.4 MG SL tablet Place 1 tablet under the tongue every 5 (five) minutes as needed for chest pain. 25 tablet 0   pantoprazole (PROTONIX) 40 MG tablet Take 1 tablet (40 mg total) by mouth 2 (two) times daily. Take po BID x  8 weeks, then reduce. 112 tablet 0   [START ON 02/17/2021] pantoprazole (PROTONIX) 40 MG tablet Take 1 tablet (40 mg total) by mouth daily. 90 tablet 3   rivaroxaban (XARELTO) 20 MG TABS tablet Take 1 tablet (20 mg total) by mouth daily with supper. 30 tablet 5   sertraline (ZOLOFT) 25 MG tablet Take 1 tablet (25 mg total) by mouth daily. 90 tablet 0   traMADol (ULTRAM) 50 MG tablet Take 1 tablet (50 mg total) by mouth every 8 (eight) hours as needed for moderate pain. 30 tablet 0   zolpidem (AMBIEN) 5 MG tablet Take 1 tablet (5 mg total) by mouth at bedtime. 30 tablet 5   No current facility-administered medications on file prior to visit.    Allergies  Allergen Reactions   Statins    Morphine And Related Other (See Comments)    Patient states it wired him up and didn't help. Prefers not to take again.   Penicillins Rash    Has patient had a PCN reaction causing  immediate rash, facial/tongue/throat swelling, SOB or lightheadedness with hypotension: No Has patient had a PCN reaction causing severe rash involving mucus membranes or skin necrosis: No Has patient had a PCN reaction that required hospitalization No Has patient had a PCN reaction occurring within the last 10 years: No If all of the above answers are "NO", then may proceed with Cephalosporin use.    Sulfonamide Derivatives Rash    Social History   Substance and Sexual Activity  Alcohol Use Not Currently   Alcohol/week: 7.0 standard drinks   Types: 1 Glasses of wine, 4 Cans of beer, 2 Shots of liquor per week    Social History   Tobacco Use  Smoking Status Former   Packs/day: 1.00   Years: 30.00   Pack years: 30.00   Types: Cigarettes   Start date: 03/30/1955   Quit date: 03/09/1993   Years since quitting: 27.9  Smokeless Tobacco Never    Review of Systems  Constitutional: Negative.   HENT:  Positive for sinus pain.   Eyes: Negative.   Respiratory: Negative.    Cardiovascular: Negative.   Gastrointestinal:  Positive for heartburn.  Genitourinary:  Positive for frequency.  Musculoskeletal: Negative.   Skin: Negative.   Neurological: Negative.   Endo/Heme/Allergies:  Bruises/bleeds easily.  Psychiatric/Behavioral: Negative.     Objective   Vitals:   02/13/21 0921  BP: 126/83  Pulse: 68  Resp: 14  Temp: 98.7 F (37.1 C)  SpO2: 94%    Physical Exam Vitals reviewed.  Constitutional:      Appearance: Normal appearance. He is not ill-appearing.  HENT:     Head: Normocephalic and atraumatic.  Cardiovascular:     Rate and Rhythm: Normal rate and regular rhythm.     Heart sounds: Normal heart sounds. No murmur heard.   No friction rub. No gallop.  Pulmonary:     Effort: Pulmonary effort is normal. No respiratory distress.     Breath sounds: Normal breath sounds. No stridor. No wheezing, rhonchi or rales.  Abdominal:     General: There is no distension.      Palpations: Abdomen is soft. There is no mass.     Tenderness: There is no abdominal tenderness. There is no guarding or rebound.     Hernia: A hernia is present.     Comments: Bilateral inguinal easily reducible hernias, right greater than left.  Genitourinary:    Testes: Normal.  Skin:    General:  Skin is warm and dry.  Neurological:     Mental Status: He is alert and oriented to person, place, and time.   CT scan report reviewed Assessment  Bilateral inguinal hernias, right greater than left.  Multiple comorbidities including chronic atrial fibrillation with Xarelto, pacemaker placement, coronary artery disease.  Patient's daily lifestyle not too impacted at this point. Plan  Patient with like to delay any surgical intervention at this point.  I am fine with that as he is asymptomatic.  He is at increased surgical risk due to his multiple comorbidities.  He understands this.  He will return should he become more symptomatic.  Literature was given.  Instructions were given on how to reduce the hernias.  Follow-up as needed.

## 2021-02-21 ENCOUNTER — Encounter (HOSPITAL_BASED_OUTPATIENT_CLINIC_OR_DEPARTMENT_OTHER): Payer: Self-pay | Admitting: Internal Medicine

## 2021-02-21 ENCOUNTER — Ambulatory Visit (INDEPENDENT_AMBULATORY_CARE_PROVIDER_SITE_OTHER): Payer: Medicare HMO | Admitting: Internal Medicine

## 2021-02-21 ENCOUNTER — Other Ambulatory Visit: Payer: Self-pay

## 2021-02-21 VITALS — BP 122/72 | HR 62 | Ht 64.0 in | Wt 178.5 lb

## 2021-02-21 DIAGNOSIS — I10 Essential (primary) hypertension: Secondary | ICD-10-CM

## 2021-02-21 DIAGNOSIS — I4821 Permanent atrial fibrillation: Secondary | ICD-10-CM | POA: Diagnosis not present

## 2021-02-21 DIAGNOSIS — R001 Bradycardia, unspecified: Secondary | ICD-10-CM

## 2021-02-21 LAB — CUP PACEART INCLINIC DEVICE CHECK
Battery Remaining Longevity: 136 mo
Battery Remaining Longevity: 136 mo
Battery Voltage: 3.02 V
Battery Voltage: 3.02 V
Brady Statistic RA Percent Paced: 0 %
Brady Statistic RA Percent Paced: 0 %
Brady Statistic RV Percent Paced: 52 %
Brady Statistic RV Percent Paced: 52 %
Date Time Interrogation Session: 20221216090307
Date Time Interrogation Session: 20221216090307
Implantable Lead Implant Date: 19980120
Implantable Lead Implant Date: 19980120
Implantable Lead Implant Date: 20050119
Implantable Lead Implant Date: 20050119
Implantable Lead Location: 753859
Implantable Lead Location: 753859
Implantable Lead Location: 753860
Implantable Lead Location: 753860
Implantable Pulse Generator Implant Date: 20200917
Implantable Pulse Generator Implant Date: 20200917
Lead Channel Impedance Value: 750 Ohm
Lead Channel Impedance Value: 750 Ohm
Lead Channel Pacing Threshold Amplitude: 0.5 V
Lead Channel Pacing Threshold Amplitude: 0.5 V
Lead Channel Pacing Threshold Pulse Width: 0.4 ms
Lead Channel Pacing Threshold Pulse Width: 0.4 ms
Lead Channel Sensing Intrinsic Amplitude: 12 mV
Lead Channel Sensing Intrinsic Amplitude: 12 mV
Lead Channel Setting Pacing Amplitude: 2.5 V
Lead Channel Setting Pacing Amplitude: 2.5 V
Lead Channel Setting Pacing Pulse Width: 0.4 ms
Lead Channel Setting Pacing Pulse Width: 0.4 ms
Lead Channel Setting Sensing Sensitivity: 2 mV
Lead Channel Setting Sensing Sensitivity: 2 mV
Pulse Gen Model: 2272
Pulse Gen Model: 2272
Pulse Gen Serial Number: 9154140
Pulse Gen Serial Number: 9154140

## 2021-02-21 MED ORDER — ATENOLOL 50 MG PO TABS: 50.0000 mg | ORAL_TABLET | Freq: Every day | ORAL | 3 refills | Status: DC

## 2021-02-21 NOTE — Progress Notes (Signed)
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  Since last being seen in our clinic, the patient reports doing very well.  Today, he denies symptoms of palpitations, chest pain, shortness of breath,  lower extremity edema, dizziness, presyncope, or syncope.  The patient is otherwise without complaint today.   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 Tawanna Solo TUNNEL RELEASE Left 2022   [place]  03/28/2003   St. Jude-PPM   PPM GENERATOR CHANGEOUT N/A 11/24/2018   Procedure: PPM GENERATOR CHANGEOUT;  Surgeon: [name], MD;  Location: [facility];  Service: Cardiovascular;  Laterality: N/A;    ROS- all systems are reviewed and negative except as per HPI above  Current Outpatient Medications  Medication Sig Dispense Refill   acetaminophen (TYLENOL) 325 MG tablet Take 650 mg by mouth every 6 (six) hours as needed for mild pain.     ALPRAZolam (XANAX) 0.25 MG tablet Take 1 tablet (0.25 mg total) by mouth daily as needed for anxiety. 20 tablet 1   atenolol (TENORMIN) 100 MG tablet Take 1 tablet (100 mg total) by mouth daily. 90 tablet 3   colchicine 0.6 MG tablet Take 1 tablet (0.6 mg total) by mouth 2 (two) times daily as needed (gout). 60 tablet 3   dicyclomine (BENTYL) 20 MG tablet Take 1 tablet (20 mg  total) by mouth 2 (two) times daily. 20 tablet 0   HYDROcodone-acetaminophen (NORCO/VICODIN) 5-325 MG tablet Take by mouth.     losartan (COZAAR) 100 MG tablet Take 0.5 tablets (50 mg total) by mouth daily. 45 tablet 0   Multiple Vitamin (MULTIVITAMIN WITH MINERALS) TABS tablet Take 1 tablet by mouth daily.     nitroGLYCERIN (NITROSTAT) 0.4 MG SL tablet Place 1 tablet under the tongue every 5 (five) minutes as needed for chest pain. 25 tablet 0   pantoprazole (PROTONIX) 40 MG tablet Take 1 tablet (40 mg total) by mouth 2 (two) times daily. Take po BID x 8 weeks, then reduce. 112 tablet 0   pantoprazole (PROTONIX) 40 MG tablet Take 1 tablet (40 mg total) by mouth daily. 90 tablet 3   rivaroxaban (XARELTO) 20 MG TABS tablet Take 1 tablet (20 mg total) by mouth daily with supper. 30 tablet 5   sertraline (ZOLOFT) 25 MG tablet Take 1 tablet (25 mg total) by mouth daily. 90 tablet 0   traMADol (ULTRAM) 50 MG tablet Take 1 tablet (50 mg total) by mouth every 8 (eight) hours as needed for moderate pain. 30 tablet 0   zolpidem (AMBIEN) 5 MG tablet Take 1 tablet (5 mg total) by mouth at bedtime. 30 tablet 5   No current  facility-administered medications for this visit.    Physical Exam: Vitals:   02/21/21 0845  BP: 122/72  Pulse: 62  SpO2: 98%  Weight: 178 lb 8 oz (81 kg)  Height: 5\' 4"  (1.626 m)    GEN- The patient is well appearing, alert and oriented x 3 today.   Head- normocephalic, atraumatic Eyes-  Sclera clear, conjunctiva pink Ears- hearing intact Oropharynx- clear Lungs- Clear to ausculation bilaterally, normal work of breathing Chest- pacemaker pocket is well healed Heart- Regular rate and rhythm, no murmurs, rubs or gallops, PMI not laterally displaced GI- soft, NT, ND, + BS Extremities- no clubbing, cyanosis, or edema  Pacemaker interrogation- reviewed in detail today,  See PACEART report  ekg tracing ordered today is personally reviewed and shows afib with V  pacing  Assessment and Plan:  1. Symptomatic bradycardia  Normal pacemaker function See Pace Art report No changes today he is not device dependant today  2. Permanent afib Rate controlled I will reduce atenolol to 50mg  daily today On xarelto for stroke prevention Labs 01/10/21 reviewed  3. HTN Stable No change required today Reduce atenolol to 50mg  daily  Risks, benefits and potential toxicities for medications prescribed and/or refilled reviewed with patient today.   Follow-up with Oda Kilts in 6 months  [name] MD, [facility] 02/21/2021 9:05 AM

## 2021-02-21 NOTE — Patient Instructions (Addendum)
Medication Instructions:  Reduce Atenolol to 50 mg daily Your physician recommends that you continue on your current medications as directed. Please refer to the Current Medication list given to you today. *If you need a refill on your cardiac medications before your next appointment, please call your pharmacy*  Lab Work: None. If you have labs (blood work) drawn today and your tests are completely normal, you will receive your results only by: [place] (if you have MyChart) OR A paper copy in the mail If you have any lab test that is abnormal or we need to change your treatment, we will call you to review the results.  Testing/Procedures: None.  Follow-Up: At [facility], you and your health needs are our priority.  As part of our continuing mission to provide you with exceptional heart care, we have created designated Provider Care Teams.  These Care Teams include your primary Cardiologist (physician) and Advanced Practice Providers (APPs -  Physician Assistants and Nurse Practitioners) who all work together to provide you with the care you need, when you need it.  Your physician wants you to follow-up in: 08/01/21 at 8:20 am with one of the following Advanced Practice Providers on your designated Care Team:     Legrand Como "Jonni Sanger" [place], [place]   Remote monitoring is used to monitor your Pacemaker from home. This monitoring reduces the number of office visits required to check your device to one time per year. It allows [place] to keep an eye on the functioning of your device to ensure it is working properly. You are scheduled for a device check from home on 05/06/21. You may send your transmission at any time that day. If you have a wireless device, the transmission will be sent automatically. After your physician reviews your transmission, you will receive a postcard with your next transmission date.  We recommend signing up for the patient portal called "MyChart".  Sign up information  is provided on this After Visit Summary.  MyChart is used to connect with patients for Virtual Visits (Telemedicine).  Patients are able to view lab/test results, encounter notes, upcoming appointments, etc.  Non-urgent messages can be sent to your provider as well.   To learn more about what you can do with MyChart, go to [email or web address].    Any Other Special Instructions Will Be Listed Below (If Applicable).

## 2021-03-19 DIAGNOSIS — F419 Anxiety disorder, unspecified: Secondary | ICD-10-CM | POA: Diagnosis not present

## 2021-03-19 DIAGNOSIS — J309 Allergic rhinitis, unspecified: Secondary | ICD-10-CM | POA: Diagnosis not present

## 2021-03-19 DIAGNOSIS — M109 Gout, unspecified: Secondary | ICD-10-CM | POA: Diagnosis not present

## 2021-03-19 DIAGNOSIS — N529 Male erectile dysfunction, unspecified: Secondary | ICD-10-CM | POA: Diagnosis not present

## 2021-03-19 DIAGNOSIS — K219 Gastro-esophageal reflux disease without esophagitis: Secondary | ICD-10-CM | POA: Diagnosis not present

## 2021-03-19 DIAGNOSIS — Z008 Encounter for other general examination: Secondary | ICD-10-CM | POA: Diagnosis not present

## 2021-03-19 DIAGNOSIS — D6869 Other thrombophilia: Secondary | ICD-10-CM | POA: Diagnosis not present

## 2021-03-19 DIAGNOSIS — M1990 Unspecified osteoarthritis, unspecified site: Secondary | ICD-10-CM | POA: Diagnosis not present

## 2021-03-19 DIAGNOSIS — I25119 Atherosclerotic heart disease of native coronary artery with unspecified angina pectoris: Secondary | ICD-10-CM | POA: Diagnosis not present

## 2021-03-19 DIAGNOSIS — I4891 Unspecified atrial fibrillation: Secondary | ICD-10-CM | POA: Diagnosis not present

## 2021-03-19 DIAGNOSIS — R69 Illness, unspecified: Secondary | ICD-10-CM | POA: Diagnosis not present

## 2021-03-19 DIAGNOSIS — I10 Essential (primary) hypertension: Secondary | ICD-10-CM | POA: Diagnosis not present

## 2021-03-19 DIAGNOSIS — I495 Sick sinus syndrome: Secondary | ICD-10-CM | POA: Diagnosis not present

## 2021-03-19 DIAGNOSIS — G4700 Insomnia, unspecified: Secondary | ICD-10-CM | POA: Diagnosis not present

## 2021-04-09 ENCOUNTER — Other Ambulatory Visit (HOSPITAL_BASED_OUTPATIENT_CLINIC_OR_DEPARTMENT_OTHER): Payer: Self-pay | Admitting: Internal Medicine

## 2021-04-09 NOTE — Telephone Encounter (Signed)
Prescription refill request for Xarelto received.  Indication: Afib  Last office visit: 02/21/21 ([facility])  [id number] Age: 81 Scr: 1.05 (01/10/21)  CrCl: 63.60ml/min  Appropriate dose and refill sent to requested pharmacy.

## 2021-04-14 DIAGNOSIS — R051 Acute cough: Secondary | ICD-10-CM | POA: Diagnosis not present

## 2021-04-14 DIAGNOSIS — J0100 Acute maxillary sinusitis, unspecified: Secondary | ICD-10-CM | POA: Diagnosis not present

## 2021-04-25 ENCOUNTER — Encounter: Payer: Self-pay | Admitting: Family Medicine

## 2021-04-25 ENCOUNTER — Ambulatory Visit (INDEPENDENT_AMBULATORY_CARE_PROVIDER_SITE_OTHER): Payer: Medicare HMO | Admitting: Family Medicine

## 2021-04-25 VITALS — BP 115/69 | HR 63 | Temp 97.9°F | Ht 64.0 in | Wt 173.8 lb

## 2021-04-25 DIAGNOSIS — I10 Essential (primary) hypertension: Secondary | ICD-10-CM | POA: Diagnosis not present

## 2021-04-25 DIAGNOSIS — M67442 Ganglion, left hand: Secondary | ICD-10-CM

## 2021-04-25 DIAGNOSIS — R1013 Epigastric pain: Secondary | ICD-10-CM

## 2021-04-25 DIAGNOSIS — I700 Atherosclerosis of aorta: Secondary | ICD-10-CM | POA: Diagnosis not present

## 2021-04-25 DIAGNOSIS — F5101 Primary insomnia: Secondary | ICD-10-CM

## 2021-04-25 DIAGNOSIS — Z79899 Other long term (current) drug therapy: Secondary | ICD-10-CM

## 2021-04-25 DIAGNOSIS — Z23 Encounter for immunization: Secondary | ICD-10-CM

## 2021-04-25 DIAGNOSIS — K219 Gastro-esophageal reflux disease without esophagitis: Secondary | ICD-10-CM | POA: Diagnosis not present

## 2021-04-25 DIAGNOSIS — I4821 Permanent atrial fibrillation: Secondary | ICD-10-CM | POA: Diagnosis not present

## 2021-04-25 DIAGNOSIS — I739 Peripheral vascular disease, unspecified: Secondary | ICD-10-CM | POA: Diagnosis not present

## 2021-04-25 DIAGNOSIS — F410 Panic disorder [episodic paroxysmal anxiety] without agoraphobia: Secondary | ICD-10-CM | POA: Diagnosis not present

## 2021-04-25 DIAGNOSIS — R69 Illness, unspecified: Secondary | ICD-10-CM | POA: Diagnosis not present

## 2021-04-25 LAB — LIPID PANEL

## 2021-04-25 MED ORDER — ROSUVASTATIN CALCIUM 5 MG PO TABS: 5.0000 mg | ORAL_TABLET | ORAL | 1 refills | Status: DC

## 2021-04-25 MED ORDER — ZOLPIDEM TARTRATE 5 MG PO TABS: 5.0000 mg | ORAL_TABLET | Freq: Every day | ORAL | 5 refills | Status: DC

## 2021-04-25 MED ORDER — PANTOPRAZOLE SODIUM 40 MG PO TBEC: 40.0000 mg | DELAYED_RELEASE_TABLET | Freq: Every day | ORAL | 3 refills | Status: DC

## 2021-04-25 MED ORDER — SERTRALINE HCL 25 MG PO TABS: 25.0000 mg | ORAL_TABLET | Freq: Every day | ORAL | 1 refills | Status: DC

## 2021-04-25 MED ORDER — LOSARTAN POTASSIUM 50 MG PO TABS: 50.0000 mg | ORAL_TABLET | Freq: Every day | ORAL | 1 refills | Status: DC

## 2021-04-25 NOTE — Progress Notes (Signed)
Assessment & Plan:  1-2. Primary insomnia/Controlled substance agreement signed Well controlled on current regimen. Controlled substance agreement in place. Urine drug screen as expected. PDMP reviewed with no concerning findings.  - CMP14+EGFR - zolpidem (AMBIEN) 5 MG tablet; Take 1 tablet (5 mg total) by mouth at bedtime.  Dispense: 30 tablet; Refill: 5  3-4. Aortic atherosclerosis (HCC)/PVD (peripheral vascular disease) ([place]) Patient agreeable to try rosuvastatin; we are going to start just once weekly to see if he tolerates it and increase if able. Continue Xarelto. Education provided on PVD. - CBC with Differential/Platelet - CMP14+EGFR - rosuvastatin (CRESTOR) 5 MG tablet; Take 1 tablet (5 mg total) by mouth once a week.  Dispense: 12 tablet; Refill: 1 - Lipid panel  5. Essential hypertension with goal blood pressure less than 130/80 Well controlled on current regimen.  - CBC with Differential/Platelet - CMP14+EGFR - losartan (COZAAR) 50 MG tablet; Take 1 tablet (50 mg total) by mouth daily.  Dispense: 90 tablet; Refill: 1 - Lipid panel  6. Permanent atrial fibrillation (HCC) Well controlled on current regimen. Managed by cardiology. - CBC with Differential/Platelet - CMP14+EGFR  7. Gastroesophageal reflux disease without esophagitis Well controlled on current regimen.  - pantoprazole (PROTONIX) 40 MG tablet; Take 1 tablet (40 mg total) by mouth daily.  Dispense: 90 tablet; Refill: 3 - CMP14+EGFR  8. Dyspepsia Well controlled on current regimen.  - pantoprazole (PROTONIX) 40 MG tablet; Take 1 tablet (40 mg total) by mouth daily.  Dispense: 90 tablet; Refill: 3  9. Panic attacks Well controlled on current regimen. Controlled substance agreement in place. Urine drug screen as expected. PDMP reviewed with no concerning findings. He knows he cannot take Xanax and Ambien near each other. - sertraline (ZOLOFT) 25 MG tablet; Take 1 tablet (25 mg total) by mouth daily.   Dispense: 90 tablet; Refill: 1  10. Ganglion cyst of finger of left hand Discussed a referral to a hand surgeon; he declined at this time.   11. Immunization due Shingrix given in office.   Return in about 6 months (around 10/23/2021) for annual physical.  Hendricks Limes, MSN, APRN, FNP-C [facility]  Subjective:    Patient ID: NATHANIE OTTLEY, male    DOB: 1941/03/09, 81 y.o.   MRN: [id number]  Patient Care Team: Loman Brooklyn, FNP as PCP - General (Family Medicine) [name], MD as PCP - Electrophysiology (Cardiology)   Chief Complaint:  Chief Complaint  Patient presents with   Annual Exam   Follow-up    6 month recheck also pt stated that the Dr from [facility] checked his legs for circulation, notice that the r-leg has less circulation, L-is not too bad. Also cyst on l-thumb due carpel tunnel surgery    HPI: YANIV LAGE is a 81 y.o. male presenting on 04/25/2021 for Annual Exam and Follow-up (6 month recheck also pt stated that the Dr from [facility] checked his legs for circulation, notice that the r-leg has less circulation, L-is not too bad. Also cyst on l-thumb due carpel tunnel surgery)  Insomnia: sleeping well with Ambien 5 mg at bedtime.   Aortic Atherosclerosis: intolerant of statins; previously caused joint pains. Does not take aspirin since he is on Xarelto.  Hypertension/A-Fib/Bradycardia: managed by cardiology who he saw on 02/21/2021 at which time atenolol was decreased to 50 mg daily. He will follow-up in 6 months. States he is going well with the medication change.  GERD: taking pantoprazole daily.  with panic attacks: taking sertraline 25 mg daily and also has Xanax 0.25 mg to take as needed, which he has not needed due to no further panic attacks. ° °GAD 7 : Generalized Anxiety Score 04/25/2021 01/17/2021 12/31/2020 10/30/2020  °Nervous, Anxious, on Edge 0 0 0 1  °Control/stop worrying 0 0 0 0  °Worry too much -  different things 0 0 0 0  °Trouble relaxing 0 0 0 1  °Restless 0 0 0 1  °Easily annoyed or irritable 0 0 0 1  °Afraid - awful might happen 0 0 0 1  °Total GAD 7 Score 0 0 0 5  °Anxiety Difficulty Not difficult at all Not difficult at all - Somewhat difficult  ° °Depression screen PHQ 2/9 04/25/2021 01/17/2021 12/31/2020  °Decreased Interest 0 0 0  °Down, Depressed, Hopeless 0 0 -  °PHQ - 2 Score 0 0 0  °Altered sleeping 0 1 0  °Tired, decreased energy 2 1 3  °Change in appetite 0 0 0  °Feeling bad or failure about yourself  0 0 0  °Trouble concentrating 0 0 0  °Moving slowly or fidgety/restless 0 0 0  °Suicidal thoughts 0 0 0  °PHQ-9 Score 2 2 3  °Difficult doing work/chores Not difficult at all Not difficult at all Somewhat difficult  ° ° °New complaints: °Patient reports the insurance doctor came out to the house and performed testing on his legs and told him his circulation is worse in the right leg.  ° °Patient also reports a cyst on his left thumb that sometimes bothers him. ° ° °Social history: ° °Relevant past medical, surgical, family and social history reviewed and updated as indicated. Interim medical history since our last visit reviewed. ° °Allergies and medications reviewed and updated. ° °DATA REVIEWED: CHART IN EPIC ° °ROS: Negative unless specifically indicated above in HPI.  ° ° °Current Outpatient Medications:  °  acetaminophen (TYLENOL) 325 MG tablet, Take 650 mg by mouth every 6 (six) hours as needed for mild pain., Disp: , Rfl:  °  ALPRAZolam (XANAX) 0.25 MG tablet, Take 1 tablet (0.25 mg total) by mouth daily as needed for anxiety., Disp: 20 tablet, Rfl: 1 °  atenolol (TENORMIN) 50 MG tablet, Take 1 tablet (50 mg total) by mouth daily., Disp: 90 tablet, Rfl: 3 °  colchicine 0.6 MG tablet, Take 1 tablet (0.6 mg total) by mouth 2 (two) times daily as needed (gout)., Disp: 60 tablet, Rfl: 3 °  dicyclomine (BENTYL) 20 MG tablet, Take 1 tablet (20 mg total) by mouth 2 (two) times daily., Disp: 20  tablet, Rfl: 0 °  HYDROcodone-acetaminophen (NORCO/VICODIN) 5-325 MG tablet, Take by mouth., Disp: , Rfl:  °  losartan (COZAAR) 100 MG tablet, Take 0.5 tablets (50 mg total) by mouth daily., Disp: 45 tablet, Rfl: 0 °  Multiple Vitamin (MULTIVITAMIN WITH MINERALS) TABS tablet, Take 1 tablet by mouth daily., Disp: , Rfl:  °  nitroGLYCERIN (NITROSTAT) 0.4 MG SL tablet, Place 1 tablet under the tongue every 5 (five) minutes as needed for chest pain., Disp: 25 tablet, Rfl: 0 °  pantoprazole (PROTONIX) 40 MG tablet, Take 1 tablet (40 mg total) by mouth 2 (two) times daily. Take po BID x 8 weeks, then reduce., Disp: 112 tablet, Rfl: 0 °  pantoprazole (PROTONIX) 40 MG tablet, Take 1 tablet (40 mg total) by mouth daily., Disp: 90 tablet, Rfl: 3 °  rivaroxaban (XARELTO) 20 MG TABS tablet, Take 1 tablet (20 mg total) by mouth daily   daily with supper., Disp: 90 tablet, Rfl: 0   sertraline (ZOLOFT) 25 MG tablet, Take 1 tablet (25 mg total) by mouth daily., Disp: 90 tablet, Rfl: 0   traMADol (ULTRAM) 50 MG tablet, Take 1 tablet (50 mg total) by mouth every 8 (eight) hours as needed for moderate pain., Disp: 30 tablet, Rfl: 0   zolpidem (AMBIEN) 5 MG tablet, Take 1 tablet (5 mg total) by mouth at bedtime., Disp: 30 tablet, Rfl: 5   Allergies  Allergen Reactions   Statins    Morphine And Related Other (See Comments)    Patient states it wired him up and didn't help. Prefers not to take again.   Penicillins Rash    Has patient had a PCN reaction causing immediate rash, facial/tongue/throat swelling, SOB or lightheadedness with hypotension: No Has patient had a PCN reaction causing severe rash involving mucus membranes or skin necrosis: No Has patient had a PCN reaction that required hospitalization No Has patient had a PCN reaction occurring within the last 10 years: No If all of the above answers are "NO", then may proceed with Cephalosporin use.    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 Tawanna Solo TUNNEL RELEASE Left 2022   [place]  03/28/2003   [employer] Jude-PPM   PPM GENERATOR CHANGEOUT N/A 11/24/2018   Procedure: PPM GENERATOR CHANGEOUT;  Surgeon: [name], MD;  Location: [facility];  Service: Cardiovascular;  Laterality: N/A;    Social History   Socioeconomic History   Marital status: Widowed    Spouse name: Not on file   Number of children: 1   Years of education: 12   Highest education level: High school graduate  Occupational History   Occupation: runs a Engineer, technical sales    Comment: part time  Tobacco Use   Smoking status: Former    Packs/day: 1.00    Years: 30.00    Pack years: 30.00    Types: Cigarettes    Start date: 03/30/1955    Quit date: 03/09/1993    Years since quitting: 28.1   Smokeless tobacco: Never  Vaping Use   Vaping Use: Never used  Substance and Sexual Activity   Alcohol use: Not Currently    Alcohol/week: 7.0 standard drinks    Types: 1 Glasses of wine, 4 Cans of beer, 2 Shots of liquor per week   Drug use: No   Sexual activity: Not Currently  Other Topics Concern   Not on file  Social History Narrative   Owns a Engineer, technical sales, enjoys golf   Lives in [place] Strain: Not on file  Food Insecurity: Not on file  Transportation Needs: Not on file  Physical Activity: Not  file  °Stress: Not on  file  °Social Connections: Not on file  °Intimate Partner Violence: Not on file  °  ° °   °Objective:  °  °BP 115/69    Pulse 63    Temp 97.9 °F (36.6 °C) (Temporal)    Ht 5' 4" (1.626 m)    Wt 173 lb 12.8 oz (78.8 kg)    SpO2 96%    BMI 29.83 kg/m²  ° °Wt Readings from Last 3 Encounters:  °04/25/21 173 lb 12.8 oz (78.8 kg)  °02/21/21 178 lb 8 oz (81 kg)  °02/13/21 176 lb (79.8 kg)  ° ° °Physical Exam °Vitals reviewed.  °Constitutional:   °   General: He is not in acute distress. °   Appearance: Normal appearance. He is overweight. He is not ill-appearing, toxic-appearing or diaphoretic.  °HENT:  °   Head: Normocephalic and atraumatic.  °Eyes:  °   General: No scleral icterus.    °   Right eye: No discharge.     °   Left eye: No discharge.  °   Conjunctiva/sclera: Conjunctivae normal.  °Cardiovascular:  °   Rate and Rhythm: Normal rate and regular rhythm.  °   Heart sounds: Normal heart sounds. No murmur heard. °  No friction rub. No gallop.  °Pulmonary:  °   Effort: Pulmonary effort is normal. No respiratory distress.  °   Breath sounds: Normal breath sounds. No stridor. No wheezing, rhonchi or rales.  °Musculoskeletal:     °   General: Normal range of motion.  °   Cervical back: Normal range of motion.  °   Comments: Cyst inside of right thumb slightly tender with palpation.  °Skin: °   General: Skin is warm and dry.  °Neurological:  °   Mental Status: He is alert and oriented to person, place, and time. Mental status is at baseline.  °Psychiatric:     °   Mood and Affect: Mood normal.     °   Behavior: Behavior normal.     °   Thought Content: Thought content normal.     °   Judgment: Judgment normal.  ° ° °Lab Results  °Component Value Date  ° TSH 0.825 04/19/2019  ° °Lab Results  °Component Value Date  ° WBC 11.6 (H) 01/10/2021  ° HGB 15.8 01/10/2021  ° HCT 46.2 01/10/2021  ° MCV 96.3 01/10/2021  ° PLT 225 01/10/2021  ° °Lab Results  °Component Value Date  ° NA 136 01/10/2021  ° K 4.3 01/10/2021  ° CO2 25  01/10/2021  ° GLUCOSE 142 (H) 01/10/2021  ° BUN 10 01/10/2021  ° CREATININE 1.05 01/10/2021  ° BILITOT 0.9 01/10/2021  ° ALKPHOS 67 01/10/2021  ° AST 29 01/10/2021  ° ALT 24 01/10/2021  ° PROT 6.9 01/10/2021  ° ALBUMIN 4.2 01/10/2021  ° CALCIUM 9.3 01/10/2021  ° ANIONGAP 9 01/10/2021  ° EGFR 61 10/09/2020  ° °Lab Results  °Component Value Date  ° CHOL 159 09/29/2020  ° °Lab Results  °Component Value Date  ° HDL 67 09/29/2020  ° °Lab Results  °Component Value Date  ° LDLCALC 82 09/29/2020  ° °Lab Results  °Component Value Date  ° TRIG 50 09/29/2020  ° °Lab Results  °Component Value Date  ° CHOLHDL 2.4 09/29/2020  ° °Lab Results  °Component Value Date  ° HGBA1C 5.5 03/26/2016  ° ° °   ° ° ° ° °

## 2021-04-26 LAB — CBC WITH DIFFERENTIAL/PLATELET
Basophils Absolute: 0.1 10*3/uL (ref 0.0–0.2)
Basos: 1 %
EOS (ABSOLUTE): 0.2 10*3/uL (ref 0.0–0.4)
Eos: 2 %
Hematocrit: 46.8 % (ref 37.5–51.0)
Hemoglobin: 16.2 g/dL (ref 13.0–17.7)
Immature Grans (Abs): 0 10*3/uL (ref 0.0–0.1)
Immature Granulocytes: 1 %
Lymphocytes Absolute: 1.4 10*3/uL (ref 0.7–3.1)
Lymphs: 17 %
MCH: 32.4 pg (ref 26.6–33.0)
MCHC: 34.6 g/dL (ref 31.5–35.7)
MCV: 94 fL (ref 79–97)
Monocytes Absolute: 0.6 10*3/uL (ref 0.1–0.9)
Monocytes: 8 %
Neutrophils Absolute: 5.9 10*3/uL (ref 1.4–7.0)
Neutrophils: 71 %
Platelets: 190 10*3/uL (ref 150–450)
RBC: 5 x10E6/uL (ref 4.14–5.80)
RDW: 13 % (ref 11.6–15.4)
WBC: 8.2 10*3/uL (ref 3.4–10.8)

## 2021-04-26 LAB — CMP14+EGFR
ALT: 32 IU/L (ref 0–44)
AST: 36 IU/L (ref 0–40)
Albumin/Globulin Ratio: 2.3 — ABNORMAL HIGH (ref 1.2–2.2)
Albumin: 4.3 g/dL (ref 3.6–4.6)
Alkaline Phosphatase: 83 IU/L (ref 44–121)
BUN/Creatinine Ratio: 16 (ref 10–24)
BUN: 16 mg/dL (ref 8–27)
Bilirubin Total: 0.6 mg/dL (ref 0.0–1.2)
CO2: 25 mmol/L (ref 20–29)
Calcium: 9.5 mg/dL (ref 8.6–10.2)
Chloride: 98 mmol/L (ref 96–106)
Creatinine, Ser: 1.02 mg/dL (ref 0.76–1.27)
Globulin, Total: 1.9 g/dL (ref 1.5–4.5)
Glucose: 99 mg/dL (ref 70–99)
Potassium: 4.5 mmol/L (ref 3.5–5.2)
Sodium: 135 mmol/L (ref 134–144)
Total Protein: 6.2 g/dL (ref 6.0–8.5)
eGFR: 74 mL/min/{1.73_m2} (ref >59)

## 2021-04-26 LAB — LIPID PANEL
Chol/HDL Ratio: 2.1 ratio (ref 0.0–5.0)
Cholesterol, Total: 190 mg/dL (ref 100–199)
HDL: 92 mg/dL (ref >39)
LDL Chol Calc (NIH): 85 mg/dL (ref 0–99)
Triglycerides: 68 mg/dL (ref 0–149)
VLDL Cholesterol Cal: 13 mg/dL (ref 5–40)

## 2021-04-28 ENCOUNTER — Encounter: Payer: Medicare HMO | Admitting: Internal Medicine

## 2021-05-06 ENCOUNTER — Ambulatory Visit (INDEPENDENT_AMBULATORY_CARE_PROVIDER_SITE_OTHER): Payer: Medicare HMO

## 2021-05-06 DIAGNOSIS — I495 Sick sinus syndrome: Secondary | ICD-10-CM | POA: Diagnosis not present

## 2021-05-06 LAB — CUP PACEART REMOTE DEVICE CHECK
Battery Remaining Longevity: 112 mo
Battery Remaining Percentage: 85 %
Battery Voltage: 3.01 V
Brady Statistic RV Percent Paced: 58 %
Date Time Interrogation Session: 20230228121404
Implantable Lead Implant Date: 19980120
Implantable Lead Implant Date: 20050119
Implantable Lead Location: 753859
Implantable Lead Location: 753860
Implantable Pulse Generator Implant Date: 20200917
Lead Channel Impedance Value: 660 Ohm
Lead Channel Pacing Threshold Amplitude: 0.5 V
Lead Channel Pacing Threshold Pulse Width: 0.4 ms
Lead Channel Sensing Intrinsic Amplitude: 12 mV
Lead Channel Setting Pacing Amplitude: 2.5 V
Lead Channel Setting Pacing Pulse Width: 0.4 ms
Lead Channel Setting Sensing Sensitivity: 2 mV
Pulse Gen Model: 2272
Pulse Gen Serial Number: 9154140

## 2021-05-13 DIAGNOSIS — L570 Actinic keratosis: Secondary | ICD-10-CM | POA: Diagnosis not present

## 2021-05-13 DIAGNOSIS — L821 Other seborrheic keratosis: Secondary | ICD-10-CM | POA: Diagnosis not present

## 2021-05-13 DIAGNOSIS — X32XXXD Exposure to sunlight, subsequent encounter: Secondary | ICD-10-CM | POA: Diagnosis not present

## 2021-05-13 NOTE — Progress Notes (Signed)
Remote pacemaker transmission.   

## 2021-06-05 ENCOUNTER — Ambulatory Visit (INDEPENDENT_AMBULATORY_CARE_PROVIDER_SITE_OTHER): Payer: Medicare HMO

## 2021-06-05 VITALS — Wt 173.0 lb

## 2021-06-05 DIAGNOSIS — Z0000 Encounter for general adult medical examination without abnormal findings: Secondary | ICD-10-CM

## 2021-06-05 NOTE — Progress Notes (Signed)
? ?Subjective:  ? Peter Howe is a 81 y.o. male who presents for Medicare Annual/Subsequent preventive examination. ? ?Virtual Visit via Telephone Note ? ?I connected with  Peter Howe on 06/05/21 at  3:30 PM EDT by telephone and verified that I am speaking with the correct person using two identifiers. ? ?Location: ?Patient: Home ?Provider: [facility] ?Persons participating in the virtual visit: patient/Nurse Health Advisor ?  ?I discussed the limitations, risks, security and privacy concerns of performing an evaluation and management service by telephone and the availability of in person appointments. The patient expressed understanding and agreed to proceed. ? ?Interactive audio and video telecommunications were attempted between this nurse and patient, however failed, due to patient having technical difficulties OR patient did not have access to video capability.  We continued and completed visit with audio only. ? ?Some vital signs may be absent or patient reported.  ? ?Peter Jacinto Dionne Ano, Peter Howe  ? ?Review of Systems    ? ?Cardiac Risk Factors include: advanced age (>19mn, >>7women);male gender;hypertension;Other (see comment), Risk factor comments: A.Fib, atherosclerosis, S/P pacemaker, sick sinus/ tachy/brady syndrome ? ?   ?Objective:  ?  ?Today's Vitals  ? 06/05/21 1423  ?Weight: 173 lb (78.5 kg)  ? ?Body mass index is 29.7 kg/m?. ? ? ?  06/05/2021  ?  2:30 PM 01/10/2021  ?  5:08 PM 10/27/2020  ? 11:46 PM 09/29/2020  ?  2:33 AM 09/28/2020  ?  9:24 PM 06/03/2020  ?  3:34 PM 05/02/2019  ?  8:31 AM  ?Advanced Directives  ?Does Patient Have a Medical Advance Directive? No No No No No No No  ?Would patient like information on creating a medical advance directive? No - Patient declined  No - Patient declined No - Patient declined;No - Guardian declined No - Patient declined No - Patient declined No - Patient declined  ? ? ?Current Medications (verified) ?Outpatient Encounter Medications as of 06/05/2021  ?Medication Sig   ? acetaminophen (TYLENOL) 325 MG tablet Take 650 mg by mouth every 6 (six) hours as needed for mild pain.  ? ALPRAZolam (XANAX) 0.25 MG tablet Take 1 tablet (0.25 mg total) by mouth daily as needed for anxiety.  ? atenolol (TENORMIN) 50 MG tablet Take 1 tablet (50 mg total) by mouth daily.  ? colchicine 0.6 MG tablet Take 1 tablet (0.6 mg total) by mouth 2 (two) times daily as needed (gout).  ? losartan (COZAAR) 50 MG tablet Take 1 tablet (50 mg total) by mouth daily.  ? Multiple Vitamin (MULTIVITAMIN WITH MINERALS) TABS tablet Take 1 tablet by mouth daily.  ? nitroGLYCERIN (NITROSTAT) 0.4 MG SL tablet Place 1 tablet under the tongue every 5 (five) minutes as needed for chest pain.  ? pantoprazole (PROTONIX) 40 MG tablet Take 1 tablet (40 mg total) by mouth daily.  ? rivaroxaban (XARELTO) 20 MG TABS tablet Take 1 tablet (20 mg total) by mouth daily with supper.  ? rosuvastatin (CRESTOR) 5 MG tablet Take 1 tablet (5 mg total) by mouth once a week.  ? sertraline (ZOLOFT) 25 MG tablet Take 1 tablet (25 mg total) by mouth daily.  ? zolpidem (AMBIEN) 5 MG tablet Take 1 tablet (5 mg total) by mouth at bedtime.  ? ?No facility-administered encounter medications on file as of 06/05/2021.  ? ? ?Allergies (verified) ?Statins, Morphine and related, Penicillins, and Sulfonamide derivatives  ? ?History: ?Past Medical History:  ?Diagnosis Date  ? Anxiety   ? on meds  ? Aortic  atherosclerosis ([place])   ? Arthritis   ? generalized  ? Bilateral inguinal hernia 01/10/2021  ? [facility])   ? skin cancers removed  ? Diverticulosis 01/10/2021  ? GERD (gastroesophageal reflux disease)   ? on meds  ? Hyperlipidemia   ? on meds  ? Hypertension   ? on meds  ? Overweight(278.02)   ? obesity  ? Permanent atrial fibrillation ([place])   ? Seasonal allergies   ? Tachycardia-bradycardia [facility])   ? s/p PPM  ? ?Past Surgical History:  ?Procedure Laterality Date  ? ARM SKIN LESION BIOPSY / EXCISION Left   ? Melanoma -removed by Dr. [place] Crane  ? CARPAL TUNNEL  RELEASE Left 2022  ? PACEMAKER INSERTION  03/28/2003  ? [place]  ? [place] N/A 11/24/2018  ? Procedure: PPM GENERATOR CHANGEOUT;  Surgeon: [name], MD;  Location: [facility];  Service: Cardiovascular;  Laterality: N/A;  ? ?Family History  ?Problem Relation Age of Onset  ? Cancer Mother 72  ?     unknown location  ? CAD Father 103  ? Breast cancer Sister   ? Breast cancer Sister   ? Stroke Sister   ? Breast cancer Sister   ? Breast cancer Sister   ? Diabetes Brother   ? Cancer Brother   ? Alcohol abuse Brother   ? Heart disease Daughter   ? Colon polyps Neg Hx   ? Colon cancer Neg Hx   ? Esophageal cancer Neg Hx   ? Stomach cancer Neg Hx   ? Rectal cancer Neg Hx   ? ?Social History  ? ?Socioeconomic History  ? Marital status: Widowed  ?  Spouse name: Not on file  ? Number of children: 1  ? Years of education: 3  ? Highest education level: High school graduate  ?Occupational History  ? Occupation: runs a Engineer, technical sales  ?  Comment: part time  ?Tobacco Use  ? Smoking status: Former  ?  Packs/day: 1.00  ?  Years: 30.00  ?  Pack years: 30.00  ?  Types: Cigarettes  ?  Start date: 03/30/1955  ?  Quit date: 03/09/1993  ?  Years since quitting: 28.2  ? Smokeless tobacco: Never  ?Vaping Use  ? Vaping Use: Never used  ?Substance and Sexual Activity  ? Alcohol use: Not Currently  ?  Alcohol/week: 7.0 standard drinks  ?  Types: 1 Glasses of wine, 4 Cans of beer, 2 Shots of liquor per week  ? Drug use: No  ? Sexual activity: Not Currently  ?Other Topics Concern  ? Not on file  ?Social History Narrative  ? Owns a Engineer, technical sales, enjoys golf.  ? Lives in [place]  ? Brother lives 5 miles away  ? ?Social Determinants of Health  ? ?Financial Resource Strain: Low Risk   ? Difficulty of Paying Living Expenses: Not hard at all  ?Food Insecurity: No Food Insecurity  ? Worried About Charity fundraiser in the Last Year: Never true  ? Ran Out of Food in the Last Year: Never true  ?Transportation Needs: No  Transportation Needs  ? Lack of Transportation (Medical): No  ? Lack of Transportation (Non-Medical): No  ?Physical Activity: Sufficiently Active  ? Days of Exercise per Week: 5 days  ? Minutes of Exercise per Session: 60 min  ?Stress: No Stress Concern Present  ? Feeling of Stress : Only a little  ?Social Connections: Moderately Integrated  ? Frequency of Communication with Friends  and Family: More than three times a week  ? Frequency of Social Gatherings with Friends and Family: More than three times a week  ? Attends Religious Services: More than 4 times per year  ? Active Member of Clubs or Organizations: Yes  ? Attends Archivist Meetings: More than 4 times per year  ? Marital Status: Widowed  ? ? ?Tobacco Counseling ?Counseling given: Not Answered ? ? ?Clinical Intake: ? ?Pre-visit preparation completed: Yes ? ?Pain : No/denies pain ? ?  ? ?BMI - recorded: 29.7 ?Nutritional Status: BMI 25 -29 Overweight ?Nutritional Risks: None ?Diabetes: No ? ?How often do you need to have someone help you when you read instructions, pamphlets, or other written materials from your doctor or pharmacy?: 1 - Never ? ?Diabetic? no ? ?Interpreter Needed?: No ? ?Information entered by :: Peter Abruzzo, Peter Howe ? ? ?Activities of Daily Living ? ?  06/05/2021  ?  2:31 PM 09/29/2020  ?  2:38 AM  ?In your present state of health, do you have any difficulty performing the following activities:  ?Hearing? 1   ?Comment [address] [place]   ?Vision? 0   ?Difficulty concentrating or making decisions? 1   ?Comment mild intermittent   ?Walking or climbing stairs? 0   ?Dressing or bathing? 0   ?Doing errands, shopping? 0 0  ?Preparing Food and eating ? N   ?Using the Toilet? N   ?In the past six months, have you accidently leaked urine? N   ?Do you have problems with loss of bowel control? N   ?Managing your Medications? N   ?Managing your Finances? N   ?Housekeeping or managing your Housekeeping? N   ? ? ?Patient Care Team: ?Loman Brooklyn, FNP as PCP - General (Family Medicine) ?[name], MD as PCP - Electrophysiology (Cardiology) ?Thornton Park, MD as Consulting Physician (Gastroenterology) ?Aviva Signs, MD as [place] Glass

## 2021-06-05 NOTE — Patient Instructions (Signed)
Peter Howe , ?Thank you for taking time to come for your Medicare Wellness Visit. I appreciate your ongoing commitment to your health goals. Please review the following plan we discussed and let me know if I can assist you in the future.  ? ?Screening recommendations/referrals: ?Colonoscopy: Done 12/20/2020 - Follow up with Dr Tarri Glenn in October 2023 to possible repeat ?Recommended yearly ophthalmology/optometry visit for glaucoma screening and checkup ?Recommended yearly dental visit for hygiene and checkup ? ?Vaccinations: ?Influenza vaccine: Done 01/17/2021 - Repeat annually ?Pneumococcal vaccine: Done 12/15/2018 & 04/24/2020 ?Tdap vaccine: Due - recommended every 10 years * ?Shingles vaccine: Done 04/25/2021 - get second dose in 2-6 months   ?Covid-19: Done 04/01/2019, 04/29/2019, & 02/21/2020 ? ?Advanced directives: Advance directive discussed with you today. Even though you declined this today, please call our office should you change your mind, and we can give you the proper paperwork for you to fill out.  ? ?Conditions/risks identified: Keep up the great work! Try to stick to a heart healthy diet - see tips at the end of this summary. ? ?Next appointment: Follow up in one year for your annual wellness visit.  ? ?Preventive Care 35 Years and Older, Male ? ?Preventive care refers to lifestyle choices and visits with your health care provider that can promote health and wellness. ?What does preventive care include? ?A yearly physical exam. This is also called an annual well check. ?Dental exams once or twice a year. ?Routine eye exams. Ask your health care provider how often you should have your eyes checked. ?Personal lifestyle choices, including: ?Daily care of your teeth and gums. ?Regular physical activity. ?Eating a healthy diet. ?Avoiding tobacco and drug use. ?Limiting alcohol use. ?Practicing safe sex. ?Taking low doses of aspirin every day. ?Taking vitamin and mineral supplements as recommended by your  health care provider. ?What happens during an annual well check? ?The services and screenings done by your health care provider during your annual well check will depend on your age, overall health, lifestyle risk factors, and family history of disease. ?Counseling  ?Your health care provider may ask you questions about your: ?Alcohol use. ?Tobacco use. ?Drug use. ?Emotional well-being. ?Home and relationship well-being. ?Sexual activity. ?Eating habits. ?History of falls. ?Memory and ability to understand (cognition). ?Work and work Statistician. ?Screening  ?You may have the following tests or measurements: ?Height, weight, and BMI. ?Blood pressure. ?Lipid and cholesterol levels. These may be checked every 5 years, or more frequently if you are over 85 years old. ?Skin check. ?Lung cancer screening. You may have this screening every year starting at age 52 if you have a 30-pack-year history of smoking and currently smoke or have quit within the past 15 years. ?Fecal occult blood test (FOBT) of the stool. You may have this test every year starting at age 102. ?Flexible sigmoidoscopy or colonoscopy. You may have a sigmoidoscopy every 5 years or a colonoscopy every 10 years starting at age 34. ?Prostate cancer screening. Recommendations will vary depending on your family history and other risks. ?Hepatitis C blood test. ?Hepatitis B blood test. ?Sexually transmitted disease (STD) testing. ?Diabetes screening. This is done by checking your blood sugar (glucose) after you have not eaten for a while (fasting). You may have this done every 1-3 years. ?Abdominal aortic aneurysm (AAA) screening. You may need this if you are a current or former smoker. ?Osteoporosis. You may be screened starting at age 79 if you are at high risk. ?Talk with your health care provider  about your test results, treatment options, and if necessary, the need for more tests. ?Vaccines  ?Your health care provider may recommend certain vaccines, such  as: ?Influenza vaccine. This is recommended every year. ?Tetanus, diphtheria, and acellular pertussis (Tdap, Td) vaccine. You may need a Td booster every 10 years. ?Zoster vaccine. You may need this after age 40. ?Pneumococcal 13-valent conjugate (PCV13) vaccine. One dose is recommended after age 52. ?Pneumococcal polysaccharide (PPSV23) vaccine. One dose is recommended after age 42. ?Talk to your health care provider about which screenings and vaccines you need and how often you need them. ?This information is not intended to replace advice given to you by your health care provider. Make sure you discuss any questions you have with your health care provider. ?Document Released: 03/22/2015 Document Revised: 11/13/2015 Document Reviewed: 12/25/2014 ?Elsevier Interactive Patient Education ? 2017 [place]. ? ?Fall Prevention in the Home ?Falls can cause injuries. They can happen to people of all ages. There are many things you can do to make your home safe and to help prevent falls. ?What can I do on the outside of my home? ?Regularly fix the edges of walkways and driveways and fix any cracks. ?Remove anything that might make you trip as you walk through a door, such as a raised step or threshold. ?Trim any bushes or trees on the path to your home. ?Use bright outdoor lighting. ?Clear any walking paths of anything that might make someone trip, such as rocks or tools. ?Regularly check to see if handrails are loose or broken. Make sure that both sides of any steps have handrails. ?Any raised decks and porches should have guardrails on the edges. ?Have any leaves, snow, or ice cleared regularly. ?Use sand or salt on walking paths during winter. ?Clean up any spills in your garage right away. This includes oil or grease spills. ?What can I do in the bathroom? ?Use night lights. ?Install grab bars by the toilet and in the tub and shower. Do not use towel bars as grab bars. ?Use non-skid mats or decals in the tub or  shower. ?If you need to sit down in the shower, use a plastic, non-slip stool. ?Keep the floor dry. Clean up any water that spills on the floor as soon as it happens. ?Remove soap buildup in the tub or shower regularly. ?Attach bath mats securely with double-sided non-slip rug tape. ?Do not have throw rugs and other things on the floor that can make you trip. ?What can I do in the bedroom? ?Use night lights. ?Make sure that you have a light by your bed that is easy to reach. ?Do not use any sheets or blankets that are too big for your bed. They should not hang down onto the floor. ?Have a firm chair that has side arms. You can use this for support while you get dressed. ?Do not have throw rugs and other things on the floor that can make you trip. ?What can I do in the kitchen? ?Clean up any spills right away. ?Avoid walking on wet floors. ?Keep items that you use a lot in easy-to-reach places. ?If you need to reach something above you, use a strong step stool that has a grab bar. ?Keep electrical cords out of the way. ?Do not use floor polish or wax that makes floors slippery. If you must use wax, use non-skid floor wax. ?Do not have throw rugs and other things on the floor that can make you trip. ?What can I do  with my stairs? ?Do not leave any items on the stairs. ?Make sure that there are handrails on both sides of the stairs and use them. Fix handrails that are broken or loose. Make sure that handrails are as long as the stairways. ?Check any carpeting to make sure that it is firmly attached to the stairs. Fix any carpet that is loose or worn. ?Avoid having throw rugs at the top or bottom of the stairs. If you do have throw rugs, attach them to the floor with carpet tape. ?Make sure that you have a light switch at the top of the stairs and the bottom of the stairs. If you do not have them, ask someone to add them for you. ?What else can I do to help prevent falls? ?Wear shoes that: ?Do not have high heels. ?Have  rubber bottoms. ?Are comfortable and fit you well. ?Are closed at the toe. Do not wear sandals. ?If you use a stepladder: ?Make sure that it is fully opened. Do not climb a closed stepladder. ?Make sure that

## 2021-07-11 ENCOUNTER — Other Ambulatory Visit: Payer: Self-pay

## 2021-07-11 ENCOUNTER — Other Ambulatory Visit: Payer: Self-pay | Admitting: Family Medicine

## 2021-07-11 DIAGNOSIS — I739 Peripheral vascular disease, unspecified: Secondary | ICD-10-CM

## 2021-07-11 DIAGNOSIS — I4821 Permanent atrial fibrillation: Secondary | ICD-10-CM

## 2021-07-11 DIAGNOSIS — I700 Atherosclerosis of aorta: Secondary | ICD-10-CM

## 2021-07-11 MED ORDER — RIVAROXABAN 20 MG PO TABS: 20.0000 mg | ORAL_TABLET | Freq: Every day | ORAL | 1 refills | Status: DC

## 2021-07-11 NOTE — Telephone Encounter (Signed)
Prescription refill request for Xarelto received.  ?Indication: Afib  ?Last office visit: 02/21/21 ([place]) ?Weight: 78.5kg ?Age: 81 ?Scr: 1.02 (04/25/21) ?CrCl: 63.25m/min ? ?Appropriate dose and refill sent to requested pharmacy.  ?

## 2021-07-15 ENCOUNTER — Other Ambulatory Visit: Payer: Self-pay | Admitting: Family Medicine

## 2021-07-15 DIAGNOSIS — I700 Atherosclerosis of aorta: Secondary | ICD-10-CM

## 2021-07-15 DIAGNOSIS — I739 Peripheral vascular disease, unspecified: Secondary | ICD-10-CM

## 2021-07-15 MED ORDER — ROSUVASTATIN CALCIUM 5 MG PO TABS: 5.0000 mg | ORAL_TABLET | ORAL | 3 refills | Status: DC

## 2021-07-15 NOTE — Progress Notes (Signed)
Refill sent for 100 day supply per insurance request. ?

## 2021-07-31 NOTE — Progress Notes (Signed)
Electrophysiology Office Note Date: 08/01/2021  ID:  Peter, Howe 1940-05-19, MRN [id number]  PCP: Peter [place], FNP Primary Cardiologist: None Electrophysiologist: Peter Grayer, MD   CC: Pacemaker follow-up  Peter Howe is a 81 y.o. male seen today for Peter Grayer, MD for routine electrophysiology followup.  Since last being seen in our clinic the patient reports doing very well.  he denies chest pain, palpitations, dyspnea, PND, orthopnea, nausea, vomiting, dizziness, syncope, edema, weight gain, or early satiety.  Device History: St. 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 Tawanna Solo TUNNEL RELEASE Left 2022   [place]  03/28/2003   St. Jude-PPM   PPM GENERATOR CHANGEOUT N/A 11/24/2018   Procedure: PPM GENERATOR CHANGEOUT;  Surgeon: Peter Grayer, MD;  Location: [facility];  Service: Cardiovascular;  Laterality: N/A;    Current Outpatient Medications  Medication Sig Dispense Refill   acetaminophen (TYLENOL) 325 MG tablet Take 650 mg by mouth every 6 (six) hours as needed for mild pain.     ALPRAZolam (XANAX) 0.25 MG tablet Take 1 tablet (0.25 mg total) by mouth daily as needed for anxiety. 20 tablet 1   atenolol (TENORMIN) 50 MG tablet Take 1 tablet (50 mg total) by mouth daily. 90 tablet 3    colchicine 0.6 MG tablet Take 1 tablet (0.6 mg total) by mouth 2 (two) times daily as needed (gout). 60 tablet 3   losartan (COZAAR) 50 MG tablet Take 1 tablet (50 mg total) by mouth daily. 90 tablet 1   Multiple Vitamin (MULTIVITAMIN WITH MINERALS) TABS tablet Take 1 tablet by mouth daily.     nitroGLYCERIN (NITROSTAT) 0.4 MG SL tablet Place 1 tablet under the tongue every 5 (five) minutes as needed for chest pain. 25 tablet 0   pantoprazole (PROTONIX) 40 MG tablet Take 1 tablet (40 mg total) by mouth daily. 90 tablet 3   rivaroxaban (XARELTO) 20 MG TABS tablet Take 1 tablet (20 mg total) by mouth daily with supper. 90 tablet 1   rosuvastatin (CRESTOR) 5 MG tablet Take 1 tablet (5 mg total) by mouth once a week. 15 tablet 3   sertraline (ZOLOFT) 25 MG tablet Take 1 tablet (25 mg total) by mouth daily. 90 tablet 1   zolpidem (AMBIEN) 5 MG tablet Take 1 tablet (5 mg total) by mouth at bedtime. 30 tablet 5   No current facility-administered medications for this visit.    Allergies:   Morphine and related, Penicillins, Statins, and Sulfonamide derivatives   Social History: Social History   Socioeconomic History   Marital status: Widowed    Spouse name: Not on file   Number of children: 1  Years of education: 59   Highest education level: High school graduate  Occupational History   Occupation: runs a Engineer, technical sales    Comment: part time  Tobacco Use   Smoking status: Former    Packs/day: 1.00    Years: 30.00    Pack years: 30.00    Types: Cigarettes    Start date: 03/30/1955    Quit date: 03/09/1993    Years since quitting: 28.4   Smokeless tobacco: Never  Vaping Use   Vaping Use: Never used  Substance and Sexual Activity   Alcohol use: Not Currently    Alcohol/week: 7.0 standard drinks    Types: 1 Glasses of wine, 4 Cans of beer, 2 Shots of liquor per week   Drug use: No   Sexual activity: Not Currently  Other Topics Concern   Not on file  Social History Narrative   Owns  a Engineer, technical sales, enjoys golf.   Lives in [place] lives 5 miles away   Social Determinants of Health   Financial Resource Strain: Low Risk    Difficulty of Paying Living Expenses: Not hard at all  Food Insecurity: No Food Insecurity   Worried About Charity fundraiser in the Last Year: Never true   Arboriculturist in the Last Year: Never true  Transportation Needs: No Transportation Needs   Lack of Transportation (Medical): No   Lack of Transportation (Non-Medical): No  Physical Activity: Sufficiently Active   Days of Exercise per Week: 5 days   Minutes of Exercise per Session: 60 min  Stress: No Stress Concern Present   Feeling of Stress : Only a little  Social Connections: Moderately Integrated   Frequency of Communication with Friends and Family: More than three times a week   Frequency of Social Gatherings with Friends and Family: More than three times a week   Attends Religious Services: More than 4 times per year   Active Member of [employer] or Organizations: Yes   Attends Archivist Meetings: More than 4 times per year   Marital Status: Widowed  Human resources officer Violence: Not At Risk   Fear of Current or Ex-Partner: No   Emotionally Abused: No   Physically Abused: No   Sexually Abused: No    Family History: Family History  Problem Relation Age of Onset   Cancer Mother 59       unknown location   CAD Father 8   Breast cancer Sister    Breast cancer Sister    Stroke Sister    Breast cancer Sister    Breast cancer Sister    Diabetes Brother    Cancer Brother    Alcohol abuse Brother    Heart disease Daughter    Colon polyps Neg Hx    Colon cancer Neg Hx    Esophageal cancer Neg Hx    Stomach cancer Neg Hx    Rectal cancer Neg Hx      Review of Systems: All other systems reviewed and are otherwise negative except as noted above.  Physical Exam: Vitals:   08/01/21 0820  BP: 120/74  Pulse: 60  SpO2: 97%  Weight: 179 lb (81.2 kg)   Height: '5\' 4"'$  (1.626 m)     GEN- The patient is well appearing, alert and oriented x 3 today.   HEENT: normocephalic, atraumatic; sclera clear, conjunctiva pink; hearing intact; oropharynx clear; neck supple  Lungs- Clear to ausculation bilaterally, normal work of breathing.  No wheezes,  rales, rhonchi Heart- Regular rate and rhythm, no murmurs, rubs or gallops  GI- soft, non-tender, non-distended, bowel sounds present  Extremities- no clubbing or cyanosis. No edema MS- no significant deformity or atrophy Skin- warm and dry, no rash or lesion; PPM pocket well healed Psych- euthymic mood, full affect Neuro- strength and sensation are intact  PPM Interrogation- reviewed in detail today,  See PACEART report  EKG:  EKG is not ordered today.  Recent Labs: 04/25/2021: ALT 32; BUN 16; Creatinine, Ser 1.02; Hemoglobin 16.2; Platelets 190; Potassium 4.5; Sodium 135   Wt Readings from Last 3 Encounters:  08/01/21 179 lb (81.2 kg)  06/05/21 173 lb (78.5 kg)  04/25/21 173 lb 12.8 oz (78.8 kg)     Other studies Reviewed: Additional studies/ records that were reviewed today include: Previous EP office notes, Previous remote checks, Most recent labwork.   Assessment and Plan:  1. Symptomatic bradycardia s/p St. Jude PPM  Normal PPM function See Pace Art report No changes today  2. Permanent AF Rate controlled Continue xarelto Labs stable 04/2021  3. HTN Stable on current regimen   4. Non-cardiac chest pain Work up unremarkable and completely resolved with PPI  Current medicines are reviewed at length with the patient today.     Disposition:   Follow up with Dr. Quentin Ore in 6 months to establish from Dr. Rayann Heman    Signed, Shirley Friar, PA-C  08/01/2021 8:24 AM  [address] [place] [address] [phone] (office) [phone] (fax)

## 2021-08-01 ENCOUNTER — Encounter: Payer: Self-pay | Admitting: Student

## 2021-08-01 ENCOUNTER — Ambulatory Visit: Payer: Medicare HMO | Admitting: Student

## 2021-08-01 VITALS — BP 120/74 | HR 60 | Ht 64.0 in | Wt 179.0 lb

## 2021-08-01 DIAGNOSIS — R001 Bradycardia, unspecified: Secondary | ICD-10-CM | POA: Diagnosis not present

## 2021-08-01 DIAGNOSIS — I10 Essential (primary) hypertension: Secondary | ICD-10-CM

## 2021-08-01 DIAGNOSIS — I4821 Permanent atrial fibrillation: Secondary | ICD-10-CM | POA: Diagnosis not present

## 2021-08-01 DIAGNOSIS — I495 Sick sinus syndrome: Secondary | ICD-10-CM

## 2021-08-01 LAB — CUP PACEART INCLINIC DEVICE CHECK
Battery Remaining Longevity: 109 mo
Battery Voltage: 3.02 V
Brady Statistic RA Percent Paced: 0 %
Brady Statistic RV Percent Paced: 58 %
Date Time Interrogation Session: 20230526083147
Implantable Lead Implant Date: 19980120
Implantable Lead Implant Date: 20050119
Implantable Lead Location: 753859
Implantable Lead Location: 753860
Implantable Pulse Generator Implant Date: 20200917
Lead Channel Impedance Value: 675 Ohm
Lead Channel Pacing Threshold Amplitude: 0.5 V
Lead Channel Pacing Threshold Amplitude: 0.5 V
Lead Channel Pacing Threshold Pulse Width: 0.4 ms
Lead Channel Pacing Threshold Pulse Width: 0.4 ms
Lead Channel Sensing Intrinsic Amplitude: 12 mV
Lead Channel Setting Pacing Amplitude: 2.5 V
Lead Channel Setting Pacing Pulse Width: 0.4 ms
Lead Channel Setting Sensing Sensitivity: 2 mV
Pulse Gen Model: 2272
Pulse Gen Serial Number: 9154140

## 2021-08-01 NOTE — Patient Instructions (Signed)
Medication Instructions:  ?Your physician recommends that you continue on your current medications as directed. Please refer to the Current Medication list given to you today. ? ?*If you need a refill on your cardiac medications before your next appointment, please call your pharmacy* ? ? ?Lab Work: ?None ?If you have labs (blood work) drawn today and your tests are completely normal, you will receive your results only by: ?MyChart Message (if you have MyChart) OR ?A paper copy in the mail ?If you have any lab test that is abnormal or we need to change your treatment, we will call you to review the results. ? ? ?Follow-Up: ?At [facility], you and your health needs are our priority.  As part of our continuing mission to provide you with exceptional heart care, we have created designated Provider Care Teams.  These Care Teams include your primary Cardiologist (physician) and Advanced Practice Providers (APPs -  Physician Assistants and Nurse Practitioners) who all work together to provide you with the care you need, when you need it. ? ? ?Your next appointment:   ?6 month(s) ? ?The format for your next appointment:   ?In Person ? ?Provider:   ?Cameron Lambert, MD{ ?

## 2021-08-05 ENCOUNTER — Ambulatory Visit (INDEPENDENT_AMBULATORY_CARE_PROVIDER_SITE_OTHER): Payer: Medicare HMO

## 2021-08-05 DIAGNOSIS — Z950 Presence of cardiac pacemaker: Secondary | ICD-10-CM | POA: Diagnosis not present

## 2021-08-08 LAB — CUP PACEART REMOTE DEVICE CHECK
Battery Remaining Longevity: 109 mo
Battery Remaining Percentage: 83 %
Battery Voltage: 3.02 V
Brady Statistic RV Percent Paced: 58 %
Date Time Interrogation Session: 20230601151630
Implantable Lead Implant Date: 19980120
Implantable Lead Implant Date: 20050119
Implantable Lead Location: 753859
Implantable Lead Location: 753860
Implantable Pulse Generator Implant Date: 20200917
Lead Channel Impedance Value: 690 Ohm
Lead Channel Pacing Threshold Amplitude: 0.5 V
Lead Channel Pacing Threshold Pulse Width: 0.4 ms
Lead Channel Sensing Intrinsic Amplitude: 12 mV
Lead Channel Setting Pacing Amplitude: 2.5 V
Lead Channel Setting Pacing Pulse Width: 0.4 ms
Lead Channel Setting Sensing Sensitivity: 2 mV
Pulse Gen Model: 2272
Pulse Gen Serial Number: 9154140

## 2021-08-18 ENCOUNTER — Other Ambulatory Visit: Payer: Self-pay | Admitting: Family Medicine

## 2021-08-18 DIAGNOSIS — F410 Panic disorder [episodic paroxysmal anxiety] without agoraphobia: Secondary | ICD-10-CM

## 2021-08-18 DIAGNOSIS — I10 Essential (primary) hypertension: Secondary | ICD-10-CM

## 2021-08-21 NOTE — Progress Notes (Signed)
Remote pacemaker transmission.   

## 2021-09-11 ENCOUNTER — Other Ambulatory Visit: Payer: Self-pay | Admitting: Family Medicine

## 2021-09-11 DIAGNOSIS — R1013 Epigastric pain: Secondary | ICD-10-CM

## 2021-09-11 DIAGNOSIS — K219 Gastro-esophageal reflux disease without esophagitis: Secondary | ICD-10-CM

## 2021-10-18 ENCOUNTER — Other Ambulatory Visit: Payer: Self-pay | Admitting: Internal Medicine

## 2021-10-18 DIAGNOSIS — I4821 Permanent atrial fibrillation: Secondary | ICD-10-CM

## 2021-10-20 NOTE — Telephone Encounter (Signed)
Prescription refill request for Xarelto received.  Indication:  Last office visit:08/01/21 ([place]) Weight: 81.2kg Age: 81 Scr: 1.02 (04/25/21)  CrCl: 65.28m/min  Appropriate dose and refill sent to requested pharmacy.

## 2021-10-23 ENCOUNTER — Other Ambulatory Visit: Payer: Self-pay | Admitting: *Deleted

## 2021-10-23 NOTE — Patient Outreach (Signed)
  Care Coordination   10/23/2021 Name: Peter Howe MRN: [id number] DOB: 1940/10/03   Care Coordination Outreach Attempts:  A second unsuccessful outreach was attempted today to offer the patient with information about available care coordination services as a benefit of their health plan.    Nurse called patient back in response to a VM he left. Patient was appreciative about there information and stated he would think about participating. Patient did ask if he could call this nurse back if he decides to participate; nurse provided her contact number.   Follow Up Plan:  Additional outreach attempts will be made to offer the patient care coordination information and services.   Encounter Outcome:  Pt. Visit Completed  Care Coordination Interventions Activated:  No   Care Coordination Interventions:  No, not indicated    Emelia Loron RN, BSN [place] [phone] [email or web address]

## 2021-10-23 NOTE — Patient Outreach (Signed)
  Care Coordination   10/23/2021 Name: Peter Howe MRN: [id number] DOB: Jul 06, 1940   Care Coordination Outreach Attempts:  An unsuccessful telephone outreach was attempted today to offer the patient information about available care coordination services as a benefit of their health plan.   Follow Up Plan:  Additional outreach attempts will be made to offer the patient care coordination information and services.   Encounter Outcome:  No Answer  Care Coordination Interventions Activated:  No   Care Coordination Interventions:  No, not indicated    Emelia Loron RN, BSN [place] [phone] [email or web address]

## 2021-10-30 ENCOUNTER — Ambulatory Visit (INDEPENDENT_AMBULATORY_CARE_PROVIDER_SITE_OTHER): Payer: Medicare HMO | Admitting: Family Medicine

## 2021-10-30 ENCOUNTER — Encounter: Payer: Self-pay | Admitting: Family Medicine

## 2021-10-30 VITALS — BP 123/69 | HR 70 | Temp 97.3°F | Ht 64.0 in | Wt 178.4 lb

## 2021-10-30 DIAGNOSIS — Z23 Encounter for immunization: Secondary | ICD-10-CM | POA: Diagnosis not present

## 2021-10-30 DIAGNOSIS — K219 Gastro-esophageal reflux disease without esophagitis: Secondary | ICD-10-CM | POA: Diagnosis not present

## 2021-10-30 DIAGNOSIS — I10 Essential (primary) hypertension: Secondary | ICD-10-CM | POA: Diagnosis not present

## 2021-10-30 DIAGNOSIS — M109 Gout, unspecified: Secondary | ICD-10-CM | POA: Diagnosis not present

## 2021-10-30 DIAGNOSIS — R200 Anesthesia of skin: Secondary | ICD-10-CM

## 2021-10-30 DIAGNOSIS — F410 Panic disorder [episodic paroxysmal anxiety] without agoraphobia: Secondary | ICD-10-CM | POA: Diagnosis not present

## 2021-10-30 DIAGNOSIS — Z0000 Encounter for general adult medical examination without abnormal findings: Secondary | ICD-10-CM

## 2021-10-30 DIAGNOSIS — R69 Illness, unspecified: Secondary | ICD-10-CM | POA: Diagnosis not present

## 2021-10-30 DIAGNOSIS — Z79899 Other long term (current) drug therapy: Secondary | ICD-10-CM

## 2021-10-30 DIAGNOSIS — F5101 Primary insomnia: Secondary | ICD-10-CM | POA: Diagnosis not present

## 2021-10-30 DIAGNOSIS — Z0001 Encounter for general adult medical examination with abnormal findings: Secondary | ICD-10-CM

## 2021-10-30 DIAGNOSIS — R202 Paresthesia of skin: Secondary | ICD-10-CM

## 2021-10-30 DIAGNOSIS — I700 Atherosclerosis of aorta: Secondary | ICD-10-CM | POA: Diagnosis not present

## 2021-10-30 LAB — CBC WITH DIFFERENTIAL/PLATELET
Basophils Absolute: 0.1 10*3/uL (ref 0.0–0.2)
Basos: 1 %
EOS (ABSOLUTE): 0.1 10*3/uL (ref 0.0–0.4)
Eos: 2 %
Hematocrit: 46.2 % (ref 37.5–51.0)
Hemoglobin: 15.6 g/dL (ref 13.0–17.7)
Immature Grans (Abs): 0 10*3/uL (ref 0.0–0.1)
Immature Granulocytes: 0 %
Lymphocytes Absolute: 1.3 10*3/uL (ref 0.7–3.1)
Lymphs: 19 %
MCH: 32.3 pg (ref 26.6–33.0)
MCHC: 33.8 g/dL (ref 31.5–35.7)
MCV: 96 fL (ref 79–97)
Monocytes Absolute: 0.5 10*3/uL (ref 0.1–0.9)
Monocytes: 8 %
Neutrophils Absolute: 4.5 10*3/uL (ref 1.4–7.0)
Neutrophils: 70 %
Platelets: 182 10*3/uL (ref 150–450)
RBC: 4.83 x10E6/uL (ref 4.14–5.80)
RDW: 13.8 % (ref 11.6–15.4)
WBC: 6.5 10*3/uL (ref 3.4–10.8)

## 2021-10-30 LAB — CMP14+EGFR
ALT: 21 IU/L (ref 0–44)
AST: 30 IU/L (ref 0–40)
Albumin/Globulin Ratio: 1.8 (ref 1.2–2.2)
Albumin: 4.3 g/dL (ref 3.7–4.7)
Alkaline Phosphatase: 81 IU/L (ref 44–121)
BUN/Creatinine Ratio: 12 (ref 10–24)
BUN: 15 mg/dL (ref 8–27)
Bilirubin Total: 0.8 mg/dL (ref 0.0–1.2)
CO2: 27 mmol/L (ref 20–29)
Calcium: 10.3 mg/dL — ABNORMAL HIGH (ref 8.6–10.2)
Chloride: 101 mmol/L (ref 96–106)
Creatinine, Ser: 1.29 mg/dL — ABNORMAL HIGH (ref 0.76–1.27)
Globulin, Total: 2.4 g/dL (ref 1.5–4.5)
Glucose: 94 mg/dL (ref 70–99)
Potassium: 5.2 mmol/L (ref 3.5–5.2)
Sodium: 142 mmol/L (ref 134–144)
Total Protein: 6.7 g/dL (ref 6.0–8.5)
eGFR: 56 mL/min/{1.73_m2} — ABNORMAL LOW (ref >59)

## 2021-10-30 LAB — LIPID PANEL
Chol/HDL Ratio: 1.8 ratio (ref 0.0–5.0)
Cholesterol, Total: 189 mg/dL (ref 100–199)
HDL: 108 mg/dL (ref >39)
LDL Chol Calc (NIH): 71 mg/dL (ref 0–99)
Triglycerides: 54 mg/dL (ref 0–149)
VLDL Cholesterol Cal: 10 mg/dL (ref 5–40)

## 2021-10-30 MED ORDER — COLCHICINE 0.6 MG PO TABS: ORAL_TABLET | ORAL | 0 refills | Status: AC

## 2021-10-30 MED ORDER — ZOLPIDEM TARTRATE 5 MG PO TABS: 5.0000 mg | ORAL_TABLET | Freq: Every day | ORAL | 5 refills | Status: DC

## 2021-10-30 MED ORDER — LOSARTAN POTASSIUM 50 MG PO TABS: 50.0000 mg | ORAL_TABLET | Freq: Every day | ORAL | 1 refills | Status: DC

## 2021-10-30 MED ORDER — PANTOPRAZOLE SODIUM 40 MG PO TBEC: 40.0000 mg | DELAYED_RELEASE_TABLET | Freq: Two times a day (BID) | ORAL | 1 refills | Status: DC

## 2021-10-30 MED ORDER — SERTRALINE HCL 25 MG PO TABS: 25.0000 mg | ORAL_TABLET | Freq: Every day | ORAL | 1 refills | Status: DC

## 2021-10-30 NOTE — Patient Instructions (Signed)
Call [facility] to schedule your 1 year follow-up from your last colonoscopy.

## 2021-10-30 NOTE — Progress Notes (Signed)
Assessment & Plan:  Well adult exam Discussed health benefits of physical activity, and encouraged him to engage in regular exercise appropriate for his age and condition. Preventive health education provided. Declined COVID booster and TDaP.  Immunization History  Administered Date(s) Administered   Fluad Quad(high Dose 65+) 02/05/2016, 12/24/2016, 01/31/2018, 02/26/2020, 01/17/2021   Influenza, High Dose Seasonal PF 01/25/2015, 01/25/2015, 02/05/2016, 02/05/2016, 12/24/2016, 12/24/2016, 01/31/2018, 01/31/2018   Influenza, Seasonal, Injecte, Preservative Fre 01/23/2014   Influenza-Unspecified 01/23/2014, 01/31/2018, 12/08/2018   Moderna SARS-COV2 Booster Vaccination 02/21/2020   Moderna Sars-Covid-2 Vaccination 04/01/2019, 04/29/2019, 02/21/2020   Pneumococcal Conjugate-13 12/15/2018   Pneumococcal Polysaccharide-23 04/24/2020   Zoster Recombinat (Shingrix) 04/25/2021   Health Maintenance  Topic Date Due   Zoster Vaccines- Shingrix (2 of 2) 06/20/2021   COVID-19 Vaccine (3 - Moderna risk series) 11/15/2021 (Originally 03/20/2020)   INFLUENZA VACCINE  06/07/2022 (Originally 10/07/2021)   TETANUS/TDAP  10/31/2022 (Originally 03/30/1959)   COLONOSCOPY (Pts 45-45yr Insurance coverage will need to be confirmed)  12/20/2021   Pneumonia Vaccine 81 Years old  Completed   HPV VACCINES  Aged Out   - CBC with Differential/Platelet - CMP14+EGFR - Lipid panel  2. Essential hypertension with goal blood pressure less than 130/80 Well controlled on current regimen.  - losartan (COZAAR) 50 MG tablet; Take 1 tablet (50 mg total) by mouth daily.  Dispense: 90 tablet; Refill: 1 - CBC with Differential/Platelet - CMP14+EGFR - Lipid panel  3. Aortic atherosclerosis (HCC) Continue current regimen. - CMP14+EGFR - Lipid panel  4. Panic attacks Well controlled on current regimen.  - sertraline (ZOLOFT) 25 MG tablet; Take 1 tablet (25 mg total) by mouth daily.  Dispense: 90 tablet; Refill: 1 -  CMP14+EGFR  5-6. Primary insomnia/Controlled substance agreement signed Well controlled on current regimen.  Controlled substance agreement updated today.  Previous urine drug screen as expected, recollected today.  PDMP reviewed with no concerning findings. - zolpidem (AMBIEN) 5 MG tablet; Take 1 tablet (5 mg total) by mouth at bedtime.  Dispense: 30 tablet; Refill: 5 - ToxASSURE Select 13 (MW), Urine - CMP14+EGFR  7. Gastroesophageal reflux disease without esophagitis Well controlled on current regimen.  - pantoprazole (PROTONIX) 40 MG tablet; Take 1 tablet (40 mg total) by mouth 2 (two) times daily before a meal.  Dispense: 180 tablet; Refill: 1 - CMP14+EGFR  8. Controlled gout Well controlled on current regimen.  - colchicine 0.6 MG tablet; Take 1.2 mg (2 tablets) by mouth at the first sign of a gout flare followed by 0.6 mg (1 tablet) one hour later.  Dispense: 10 tablet; Refill: 0  9. Numbness and tingling in left arm Patient declined steroids at this time as he does not feel it is not bothersome.  10. Immunization due - Zoster Recombinant (Shingrix )   Follow-up: Return in about 6 months (around 05/02/2022) for follow-up of chronic medication conditions with anyone.   BHendricks Limes MSN, APRN, FNP-C [facility]  Subjective:  Patient ID: RKIMM UNGARO male    DOB: 1Oct 31, 1942 Age: 81y.o. MRN: [id number] Patient Care Team: JLoman Brooklyn FNP as PCP - General (Family Medicine) AThompson Grayer MD as PCP - Electrophysiology (Cardiology) BThornton Park MD as Consulting Physician (Gastroenterology) JAviva Signs MD as Consulting Physician (General Surgery) WGarald Balding MD as Consulting Physician (Orthopedic Surgery) HAllyn Kenner MD (Dermatology) JCelestia Khat OD (Optometry)   CC:  Chief Complaint  Patient presents with   Annual Exam   Numbness  Left arm down to hand x 1 month on and off     HPI INEZ ROSATO is a 81 y.o.  male who presents today for a complete physical exam. He reports consuming a general diet. Home exercise routine includes walking 45 minutes per day and golfing daily. He generally feels well. He reports sleeping well. He does have additional problems to discuss today.   He reports numbness of the left arm down to his hand that occurs intermittently over the past month - a few times per week. It only lasts for a few minutes and does not bother him that much.  Vision:Within last year Dental:Receives regular dental care  Advanced Directives Patient does not have advanced directives including DNR, living will, healthcare power of attorney, financial power of attorney, and MOST form.   Insomnia: sleeping well with Ambien 5 mg at bedtime.    Aortic Atherosclerosis: previously intolerant of statins as they caused joint pains. He was started on rosuvastatin 5 mg once weekly at our last visit which he reports he is tolerating well. Does not take aspirin since he is on Xarelto.   Hypertension/A-Fib/Bradycardia: managed by cardiology who he saw on 08/01/2021 at which time no changes were made. He will follow-up in 6 months.    GERD: taking pantoprazole daily but still having acid reflux.   Anxiety with panic attacks: taking sertraline 25 mg daily.     10/30/2021    9:13 AM 04/25/2021    8:57 AM 01/17/2021   10:57 AM 12/31/2020    2:24 PM  GAD 7 : Generalized Anxiety Score  Nervous, Anxious, on Edge 0 0 0 0  Control/stop worrying 0 0 0 0  Worry too much - different things 0 0 0 0  Trouble relaxing 0 0 0 0  Restless 0 0 0 0  Easily annoyed or irritable 0 0 0 0  Afraid - awful might happen 0 0 0 0  Total GAD 7 Score 0 0 0 0  Anxiety Difficulty Not difficult at all Not difficult at all Not difficult at all       10/30/2021    9:13 AM 06/05/2021    2:40 PM 04/25/2021    8:57 AM  Depression screen PHQ 2/9  Decreased Interest 0 0 0  Down, Depressed, Hopeless 0 0 0  PHQ - 2 Score 0 0 0  Altered  sleeping 0 0 0  Tired, decreased energy 0 1 2  Change in appetite 0 0 0  Feeling bad or failure about yourself  0 0 0  Trouble concentrating 0 1 0  Moving slowly or fidgety/restless 0 0 0  Suicidal thoughts 0 0 0  PHQ-9 Score 0 2 2  Difficult doing work/chores Not difficult at all Not difficult at all Not difficult at all    Review of Systems  Constitutional:  Negative for chills, fever, malaise/fatigue and weight loss.  HENT:  Negative for congestion, ear discharge, ear pain, nosebleeds, sinus pain, sore throat and tinnitus.   Eyes:  Negative for blurred vision, double vision, pain, discharge and redness.  Respiratory:  Negative for cough, shortness of breath and wheezing.   Cardiovascular:  Negative for chest pain, palpitations and leg swelling.  Gastrointestinal:  Positive for heartburn. Negative for abdominal pain, constipation, diarrhea, nausea and vomiting.  Genitourinary:  Negative for dysuria, frequency and urgency.       Denies trouble initiating a urine stream, split stream, nocturia, and dribbling. Positive for weak stream.  Musculoskeletal:  Negative for myalgias.  Skin:  Negative for rash.  Neurological:  Negative for dizziness, seizures, weakness and headaches.  Psychiatric/Behavioral:  Negative for depression, substance abuse and suicidal ideas. The patient is not nervous/anxious.      Current Outpatient Medications:    acetaminophen (TYLENOL) 325 MG tablet, Take 650 mg by mouth every 6 (six) hours as needed for mild pain., Disp: , Rfl:    atenolol (TENORMIN) 50 MG tablet, Take 1 tablet (50 mg total) by mouth daily., Disp: 90 tablet, Rfl: 3   colchicine 0.6 MG tablet, Take 1 tablet (0.6 mg total) by mouth 2 (two) times daily as needed (gout)., Disp: 60 tablet, Rfl: 3   losartan (COZAAR) 50 MG tablet, Take 1 tablet (50 mg total) by mouth daily., Disp: 90 tablet, Rfl: 0   Multiple Vitamin (MULTIVITAMIN WITH MINERALS) TABS tablet, Take 1 tablet by mouth daily., Disp: ,  Rfl:    nitroGLYCERIN (NITROSTAT) 0.4 MG SL tablet, Place 1 tablet under the tongue every 5 (five) minutes as needed for chest pain., Disp: 25 tablet, Rfl: 0   pantoprazole (PROTONIX) 40 MG tablet, Take 1 tablet (40 mg total) by mouth daily., Disp: 90 tablet, Rfl: 1   rivaroxaban (XARELTO) 20 MG TABS tablet, Take 1 tablet (20 mg total) by mouth daily with supper., Disp: 90 tablet, Rfl: 1   rosuvastatin (CRESTOR) 5 MG tablet, Take 1 tablet (5 mg total) by mouth once a week., Disp: 15 tablet, Rfl: 3   sertraline (ZOLOFT) 25 MG tablet, Take 1 tablet (25 mg total) by mouth daily., Disp: 90 tablet, Rfl: 0   zolpidem (AMBIEN) 5 MG tablet, Take 1 tablet (5 mg total) by mouth at bedtime., Disp: 30 tablet, Rfl: 5   ALPRAZolam (XANAX) 0.25 MG tablet, Take 1 tablet (0.25 mg total) by mouth daily as needed for anxiety. (Patient not taking: Reported on 10/30/2021), Disp: 20 tablet, Rfl: 1  Allergies  Allergen Reactions   Morphine And Related Other (See Comments)    Patient states it wired him up and didn't help. Prefers not to take again.   Penicillins Rash    Has patient had a PCN reaction causing immediate rash, facial/tongue/throat swelling, SOB or lightheadedness with hypotension: No Has patient had a PCN reaction causing severe rash involving mucus membranes or skin necrosis: No Has patient had a PCN reaction that required hospitalization No Has patient had a PCN reaction occurring within the last 10 years: No If all of the above answers are "NO", then may proceed with Cephalosporin use.    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 Tawanna Solo TUNNEL RELEASE Left 2022   [place]  03/28/2003   St. Jude-PPM   PPM GENERATOR CHANGEOUT N/A  11/24/2018   Procedure: PPM GENERATOR CHANGEOUT;  Surgeon: [name], MD;  Location: [facility];  Service: Cardiovascular;  Laterality: N/A;    Family History  Problem Relation Age of Onset   Cancer Mother 22       unknown location   CAD Father 63   Breast cancer Sister    Breast cancer Sister    Stroke Sister    Breast cancer Sister    Breast cancer Sister    Diabetes Brother    Cancer Brother    Alcohol abuse Brother    Heart disease Daughter    Colon polyps Neg Hx    Colon cancer Neg Hx    Esophageal cancer Neg Hx    Stomach cancer Neg Hx    Rectal cancer Neg Hx     Social History   Socioeconomic History   Marital status: Widowed    Spouse name: Not on file   Number of children: 1   Years of education: 12   Highest education level: High school graduate  Occupational History   Occupation: runs a Engineer, technical sales    Comment: part time  Tobacco Use   Smoking status: Former    Packs/day: 1.00    Years: 30.00    Total pack years: 30.00    Types: Cigarettes    Start date: 03/30/1955    Quit date: 03/09/1993    Years since quitting: 28.6   Smokeless tobacco: Never  Vaping Use   Vaping Use: Never used  Substance and Sexual Activity   Alcohol use: Not Currently    Alcohol/week: 7.0 standard drinks of alcohol    Types: 1 Glasses of wine, 4 Cans of beer, 2 Shots of liquor per week   Drug use: No   Sexual activity: Not Currently  Other Topics Concern   Not on file  Social History Narrative   Owns a Engineer, technical sales, enjoys golf.   Lives in [place] lives 5 miles away   Social Determinants  of Health   Financial Resource Strain: Low Risk  (06/05/2021)   Overall Financial Resource Strain (CARDIA)    Difficulty of Paying Living Expenses: Not hard at all  Food Insecurity: No Food Insecurity (06/05/2021)   Hunger Vital Sign    Worried About Running Out of Food in the Last Year: Never true    Ran Out of Food in the Last Year: Never true  Transportation Needs: No Transportation Needs (06/05/2021)   PRAPARE - Hydrologist (Medical): No    Lack of Transportation (Non-Medical): No  Physical Activity: Sufficiently Active (06/05/2021)   Exercise Vital Sign    Days of Exercise per Week: 5 days    Minutes of Exercise per Session: 60 min  Stress: No Stress Concern Present (06/05/2021)   [place]    Feeling of Stress : Only a little  Social Connections: Moderately Integrated (06/05/2021)   Social Connection and Isolation Panel [NHANES]    Frequency of Communication with Friends and Family: More than three times a week    Frequency of Social Gatherings with Friends and Family: More than three times a week    Attends Religious Services: More than 4 times per year    Active Member of [employer] or Organizations: Yes    Attends Archivist Meetings: More than 4 times per year    Marital Status: Widowed  Intimate Partner Violence: Not At Risk (  06/05/2021)   Humiliation, Afraid, Rape, and Kick questionnaire    Fear of Current or Ex-Partner: No    Emotionally Abused: No    Physically Abused: No    Sexually Abused: No      Objective:    BP 123/69   Pulse 70   Temp (!) 97.3 F (36.3 C) (Temporal)   Ht _0  (1.626 m)   Wt 178 lb 6.4 oz (80.9 kg)   SpO2 96%   BMI 30.62 kg/m   BP Readings from Last 3 Encounters:  10/30/21 123/69  08/01/21 120/74  04/25/21 115/69   Wt Readings from Last 3 Encounters:  10/30/21 178 lb 6.4 oz (80.9 kg)  08/01/21 179 lb (81.2 kg)  06/05/21 173 lb  (78.5 kg)    Physical Exam Vitals reviewed.  Constitutional:      General: He is not in acute distress.    Appearance: Normal appearance. He is obese. He is not ill-appearing, toxic-appearing or diaphoretic.  HENT:     Head: Normocephalic and atraumatic.     Right Ear: Tympanic membrane, ear canal and external ear normal. There is no impacted cerumen.     Left Ear: Tympanic membrane, ear canal and external ear normal. There is no impacted cerumen.     Nose: Nose normal. No congestion or rhinorrhea.     Mouth/Throat:     Mouth: Mucous membranes are moist.     Pharynx: Oropharynx is clear. No oropharyngeal exudate or posterior oropharyngeal erythema.  Eyes:     General: No scleral icterus.       Right eye: No discharge.        Left eye: No discharge.     Conjunctiva/sclera: Conjunctivae normal.     Pupils: Pupils are equal, round, and reactive to light.  Neck:     Vascular: No carotid bruit.  Cardiovascular:     Rate and Rhythm: Normal rate and regular rhythm.     Heart sounds: Normal heart sounds. No murmur heard.    No friction rub. No gallop.  Pulmonary:     Effort: Pulmonary effort is normal. No respiratory distress.     Breath sounds: Normal breath sounds. No stridor. No wheezing, rhonchi or rales.  Abdominal:     General: Abdomen is flat. Bowel sounds are normal. There is no distension.     Palpations: Abdomen is soft. There is no hepatomegaly, splenomegaly or mass.     Tenderness: There is no abdominal tenderness. There is no guarding or rebound.     Hernia: No hernia is present.  Musculoskeletal:        General: Normal range of motion.     Cervical back: Normal range of motion and neck supple. No rigidity. No muscular tenderness.     Right lower leg: No edema.     Left lower leg: No edema.  Lymphadenopathy:     Cervical: No cervical adenopathy.  Skin:    General: Skin is warm and dry.     Capillary Refill: Capillary refill takes less than 2 seconds.  Neurological:      General: No focal deficit present.     Mental Status: He is alert and oriented to person, place, and time. Mental status is at baseline.  Psychiatric:        Mood and Affect: Mood normal.        Behavior: Behavior normal.        Thought Content: Thought content normal.        Judgment:  Judgment normal.     Lab Results  Component Value Date   TSH 0.825 04/19/2019   Lab Results  Component Value Date   WBC 8.2 04/25/2021   HGB 16.2 04/25/2021   HCT 46.8 04/25/2021   MCV 94 04/25/2021   PLT 190 04/25/2021   Lab Results  Component Value Date   NA 135 04/25/2021   K 4.5 04/25/2021   CO2 25 04/25/2021   GLUCOSE 99 04/25/2021   BUN 16 04/25/2021   CREATININE 1.02 04/25/2021   BILITOT 0.6 04/25/2021   ALKPHOS 83 04/25/2021   AST 36 04/25/2021   ALT 32 04/25/2021   PROT 6.2 04/25/2021   ALBUMIN 4.3 04/25/2021   CALCIUM 9.5 04/25/2021   ANIONGAP 9 01/10/2021   EGFR 74 04/25/2021   Lab Results  Component Value Date   CHOL 190 04/25/2021   Lab Results  Component Value Date   HDL 92 04/25/2021   Lab Results  Component Value Date   LDLCALC 85 04/25/2021   Lab Results  Component Value Date   TRIG 68 04/25/2021   Lab Results  Component Value Date   CHOLHDL 2.1 04/25/2021   Lab Results  Component Value Date   HGBA1C 5.5 03/26/2016

## 2021-11-04 ENCOUNTER — Ambulatory Visit (INDEPENDENT_AMBULATORY_CARE_PROVIDER_SITE_OTHER): Payer: Medicare HMO

## 2021-11-04 DIAGNOSIS — I495 Sick sinus syndrome: Secondary | ICD-10-CM | POA: Diagnosis not present

## 2021-11-04 LAB — CUP PACEART REMOTE DEVICE CHECK
Battery Remaining Longevity: 107 mo
Battery Remaining Percentage: 81 %
Battery Voltage: 3.01 V
Brady Statistic RV Percent Paced: 60 %
Date Time Interrogation Session: 20230829140231
Implantable Lead Implant Date: 19980120
Implantable Lead Implant Date: 20050119
Implantable Lead Location: 753859
Implantable Lead Location: 753860
Implantable Pulse Generator Implant Date: 20200917
Lead Channel Impedance Value: 700 Ohm
Lead Channel Pacing Threshold Amplitude: 0.5 V
Lead Channel Pacing Threshold Pulse Width: 0.4 ms
Lead Channel Sensing Intrinsic Amplitude: 12 mV
Lead Channel Setting Pacing Amplitude: 2.5 V
Lead Channel Setting Pacing Pulse Width: 0.4 ms
Lead Channel Setting Sensing Sensitivity: 2 mV
Pulse Gen Model: 2272
Pulse Gen Serial Number: 9154140

## 2021-11-04 LAB — TOXASSURE SELECT 13 (MW), URINE

## 2021-11-05 DIAGNOSIS — X32XXXD Exposure to sunlight, subsequent encounter: Secondary | ICD-10-CM | POA: Diagnosis not present

## 2021-11-05 DIAGNOSIS — L570 Actinic keratosis: Secondary | ICD-10-CM | POA: Diagnosis not present

## 2021-11-05 DIAGNOSIS — C44729 Squamous cell carcinoma of skin of left lower limb, including hip: Secondary | ICD-10-CM | POA: Diagnosis not present

## 2021-11-12 DIAGNOSIS — S81802A Unspecified open wound, left lower leg, initial encounter: Secondary | ICD-10-CM | POA: Diagnosis not present

## 2021-11-18 DIAGNOSIS — H5203 Hypermetropia, bilateral: Secondary | ICD-10-CM | POA: Diagnosis not present

## 2021-11-18 DIAGNOSIS — D3131 Benign neoplasm of right choroid: Secondary | ICD-10-CM | POA: Diagnosis not present

## 2021-11-18 DIAGNOSIS — H2513 Age-related nuclear cataract, bilateral: Secondary | ICD-10-CM | POA: Diagnosis not present

## 2021-11-18 DIAGNOSIS — H524 Presbyopia: Secondary | ICD-10-CM | POA: Diagnosis not present

## 2021-11-18 DIAGNOSIS — H52223 Regular astigmatism, bilateral: Secondary | ICD-10-CM | POA: Diagnosis not present

## 2021-11-19 ENCOUNTER — Other Ambulatory Visit: Payer: Self-pay | Admitting: Family Medicine

## 2021-11-19 DIAGNOSIS — I10 Essential (primary) hypertension: Secondary | ICD-10-CM

## 2021-11-28 NOTE — Progress Notes (Signed)
Remote pacemaker transmission.   

## 2021-12-08 ENCOUNTER — Ambulatory Visit: Payer: Self-pay | Admitting: *Deleted

## 2021-12-08 ENCOUNTER — Encounter: Payer: Self-pay | Admitting: *Deleted

## 2021-12-08 NOTE — Patient Outreach (Signed)
  Care Coordination   Initial Visit Note   12/08/2021  Name: Peter Howe MRN: [id number] DOB: Feb 12, 1941  Peter Howe is a 81 y.o. year old male who sees Loman Brooklyn, FNP for primary care. I spoke with Ambrose Pancoast by phone today.  What matters to the patients health and wellness today?  No Interventions Identified.   SDOH assessments and interventions completed:  Yes.  SDOH Interventions Today    Flowsheet Row Most Recent Value  SDOH Interventions   Food Insecurity Interventions Intervention Not Indicated  Housing Interventions Intervention Not Indicated  Transportation Interventions Intervention Not Indicated  Utilities Interventions Intervention Not Indicated  Alcohol Usage Interventions Intervention Not Indicated (Score <7)  Financial Strain Interventions Intervention Not Indicated  Physical Activity Interventions Intervention Not Indicated  Stress Interventions Intervention Not Indicated  Social Connections Interventions Intervention Not Indicated        Care Coordination Interventions Activated:  Yes.   Care Coordination Interventions:  Yes, provided.   Follow up plan: No further intervention required.   Encounter Outcome:  Pt. Visit Completed.   Nat Christen, BSW, MSW, LCSW  Licensed Education officer, [facility]  Mailing [place] [address], [place], [place] [address] Physical Address-[address] [place], [place] [address] Toll Free Main # [phone] Fax # [phone] Cell # [phone] [email or web address]

## 2021-12-23 ENCOUNTER — Encounter: Payer: Self-pay | Admitting: Gastroenterology

## 2021-12-23 ENCOUNTER — Telehealth: Payer: Self-pay | Admitting: *Deleted

## 2021-12-23 ENCOUNTER — Ambulatory Visit: Payer: Medicare HMO | Admitting: Gastroenterology

## 2021-12-23 VITALS — BP 130/80 | HR 58 | Ht 64.0 in | Wt 180.4 lb

## 2021-12-23 DIAGNOSIS — Z86010 Personal history of colonic polyps: Secondary | ICD-10-CM | POA: Diagnosis not present

## 2021-12-23 DIAGNOSIS — Z9229 Personal history of other drug therapy: Secondary | ICD-10-CM | POA: Diagnosis not present

## 2021-12-23 MED ORDER — NA SULFATE-K SULFATE-MG SULF 17.5-3.13-1.6 GM/177ML PO SOLN: 1.0000 | Freq: Once | ORAL | 0 refills | Status: AC

## 2021-12-23 NOTE — Progress Notes (Signed)
Referring Provider: Loman Brooklyn, FNP Primary Care Physician:  Peter Brooklyn, FNP  Chief Complaint: Colon polyps   IMPRESSION:  History of colon polyps    - tubular adenoma seen on pathology reports from 2000, 2003, and 2006    - 10 TA and one TVA on colonoscopy 2023 Chronic use of Xarelto Possible family history of colon cancer (mother)  We discussed the current guidelines for colon cancer surveillance between the ages of 61 and 59.  He remains motivated and feels that colon cancer surveillance is appropriate at this time.  He would like to proceed with colonoscopy.  I discussed with the patient that there is a low, but real, risk of a cardiovascular event such as heart attack, stroke, or embolism/thrombosis while off Xarelto. Will communicate by EMR with patient's prescribing provider to confirm that holding the Xarelto is appropriate at this time.  There is a higher risk associated with colonoscopy with his comorbidities and in the setting of Xarelto including risk for postprocedure bleeding.    PLAN: Colonoscopy after a Xarelto washout  Please see the "Patient Instructions" section for addition details about the plan.  HPI: Peter Howe is a 81 y.o. male who returns in follow-up to discuss a surveillance colonoscopy.  The history is obtained through the patient and review of his electronic health record. He has a history of hypertension, atrial fibrillation on Xarelto, and tachy-brady with PPM. EF of 60-65% on echo 2022.  He has had multiple colonoscopies, he thinks the last was 5 years ago with Dr. Amedeo Plenty at [facility]. He has a a history of tubular adenomas.  I see pathology results from 2000, 2003, and 2006 documenting tubular adenomas. The patient reports a prior endoscopy many years ago but it was long enough ago that he can't remember it.   Colonoscopy 12/20/2020 to evaluate heme positive stools revealed 11 colon polyps and a very redundant colon.  10 polyps were  tubular adenomas.  1 ascending colon polyp was a tubulovillous adenoma without high-grade dysplasia. EGD 12/20/2020 revealed a widely patent Schatzki's ring, LA grade a reflux esophagitis, gastritis, duodenitis and duodenal erosions.  Biopsies confirmed reflux gastropathy, duodenitis.  He returns today to discuss surveillance colonoscopy.  He has a formed to loose stools most days. He may have had some dark stools, but no blood in the stool.  He remains motivated to prevent colon cancer and has some concerns given the number of polyps removed on his last colonoscopy.  He thinks his mother probably had colon cancer at age 17, but, they did not know the specific diagnosis at that time. No other known family history of colon cancer or polyps. No family history of uterine/endometrial cancer, pancreatic cancer or gastric/stomach cancer.   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  Hall   CARPAL TUNNEL RELEASE Left 2022   PACEMAKER INSERTION  03/28/2003   St. Jude-PPM   PPM GENERATOR CHANGEOUT N/A 11/24/2018   Procedure: PPM GENERATOR CHANGEOUT;  Surgeon: [name], MD;  Location: [facility];  Service: Cardiovascular;  Laterality: N/A;    Current Outpatient Medications  Medication Sig Dispense Refill   acetaminophen (TYLENOL) 325 MG tablet Take 650 mg by mouth every 6 (six) hours as needed for mild pain.     atenolol (TENORMIN)  50 MG tablet Take 1 tablet (50 mg total) by mouth daily. 90 tablet 3   colchicine 0.6 MG tablet Take 1.2 mg (2 tablets) by mouth at the first sign of a gout flare followed by 0.6 mg (1 tablet) one hour later. 10 tablet 0   losartan (COZAAR) 50 MG tablet Take 1 tablet (50 mg total) by mouth daily. 90 tablet 1   Multiple Vitamin (MULTIVITAMIN WITH MINERALS) TABS tablet Take 1 tablet by mouth daily.     nitroGLYCERIN (NITROSTAT) 0.4 MG SL tablet Place 1 tablet under the tongue every 5 (five) minutes as needed for chest pain. 25 tablet 0   rivaroxaban (XARELTO) 20 MG TABS tablet Take 1 tablet (20 mg total) by mouth daily with supper. 90 tablet 1   rosuvastatin (CRESTOR) 5 MG tablet Take 1 tablet (5 mg total) by mouth once a week. 15 tablet 3   sertraline (ZOLOFT) 25 MG tablet Take 1 tablet (25 mg total) by mouth daily. 90 tablet 1   zolpidem (AMBIEN) 5 MG tablet Take 1 tablet (5 mg total) by mouth at bedtime. 30 tablet 5   No current facility-administered medications for this visit.    Allergies as of 12/23/2021 - Review Complete 12/23/2021  Allergen Reaction Noted   Morphine and related Other (See Comments) 10/09/2020   Penicillins Rash    Statins  10/23/2020   Sulfonamide derivatives Rash 11/08/2009      Physical Exam: General:   Alert,  well-nourished, pleasant and cooperative in NAD Head:  Normocephalic and atraumatic. Eyes:  Sclera clear, no icterus.   Conjunctiva pink. Ears:  Normal auditory acuity. Nose:  No deformity, discharge,  or lesions. Mouth:  No deformity or lesions.   Neck:  Supple; no masses or thyromegaly. Lungs:  Clear throughout to auscultation.   No wheezes. Heart:  Irregularly irregula; no murmurs. Abdomen:  Soft, central obesity, nontender, nondistended, normal bowel sounds, no rebound or guarding. No hepatosplenomegaly.   Rectal:  Deferred  Msk:  Symmetrical. No boney deformities LAD: No inguinal or umbilical LAD Extremities:  No clubbing or  edema. Neurologic:  Alert and  oriented x4;  grossly nonfocal Skin:  Intact without significant lesions or rashes. Psych:  Alert and cooperative. Normal mood and affect.     Peter Lavalley L. Tarri Glenn, MD, MPH 12/23/2021, 10:00 AM

## 2021-12-23 NOTE — Patient Instructions (Addendum)
COLONOSCOPY:   You have been scheduled for a colonoscopy. Please follow written instructions given to you at your visit today.    PREP:   Please pick up your prep supplies at the pharmacy within the next 1-3 days.  INHALERS:   If you use inhalers (even only as needed), please bring them with you on the day of your procedure.  MEDICATIONS TO HOLD:  We will contact your provider to request permission for you to hold Xarelto.  We want to minimize any risk of possible cardiovascular event such as heart attack, stroke, or clot while off Xarelto.  Once we receive a response, you will be contacted by our office. If you do not hear from our office 1 week prior to your scheduled procedure, please contact our office at [phone].   COLONOSCOPY TIPS:  To reduce nausea and dehydration, stay well hydrated for 3-4 days prior to the exam.  To prevent skin/hemorrhoid irritation - prior to wiping, put A&Dointment or vaseline on the toilet paper. Keep a towel or pad on the bed.  BEFORE STARTING YOUR PREP, drink  64oz of clear liquids in the morning. This will help to flush the colon and will ensure you are well hydrated!!!!  NOTE - This is in addition to the fluids required for to complete your prep. Use of a flavored hard candy, such as grape Anise Salvo, can counteract some of the flavor of the prep and may prevent some nausea.

## 2021-12-23 NOTE — Telephone Encounter (Signed)
[facility] Pre-operative Risk Assessment     Request for surgical clearance:     Endoscopy Procedure  What type of surgery is being performed?     Tuesday 01/20/22  When is this surgery scheduled?     Colon  What type of clearance is required ?   Pharmacy  Are there any medications that need to be held prior to surgery and how long? Xarelto 2 days  Practice name and name of physician performing surgery?      [facility]  What is your office phone and fax number?      Phone- [phone]  [phone]  Anesthesia type (None, local, MAC, general) ?       MAC

## 2021-12-24 ENCOUNTER — Other Ambulatory Visit (HOSPITAL_BASED_OUTPATIENT_CLINIC_OR_DEPARTMENT_OTHER): Payer: Self-pay | Admitting: Internal Medicine

## 2021-12-28 NOTE — Telephone Encounter (Signed)
Patient with diagnosis of atrial fibrillation on xarelto for anticoagulation.    Procedure: Colonoscopy Date of procedure: 01/20/22   CHA2DS2-VASc Score = 3   This indicates a 3.2% annual risk of stroke. The patient's score is based upon: CHF History: 0 HTN History: 1 Diabetes History: 0 Stroke History: 0 Vascular Disease History: 0 Age Score: 2 Gender Score: 0   CrCl 52 Platelet count 182  Per office protocol, patient can hold Xarelto for 2 days prior to procedure.   Patient will not need bridging with Lovenox (enoxaparin) around procedure.  **This guidance is not considered finalized until pre-operative APP has relayed final recommendations.**

## 2021-12-29 ENCOUNTER — Telehealth: Payer: Self-pay | Admitting: *Deleted

## 2021-12-29 NOTE — Telephone Encounter (Signed)
Pt agreeable to plan of care for tele pre op appt 01/16/22 @ 3 pm. Med rec and consent are done.  Pt also states he is going to need to move his appt with Dr. Quentin Ore due to colonoscopy is same day as Dr. Quentin Ore. I assured the pt that I will have the EP scheduler all him with a new date for Dr. Quentin Ore appt. Pt said thank you.     Patient Consent for Virtual Visit        Peter Howe has provided verbal consent on 12/29/2021 for a virtual visit (video or telephone).   CONSENT FOR VIRTUAL VISIT FOR:  Peter Howe  By participating in this virtual visit I agree to the following:  I hereby voluntarily request, consent and authorize [facility] and its employed or contracted physicians, physician assistants, nurse practitioners or other licensed health care professionals (the Practitioner), to provide me with telemedicine health care services (the "Services") as deemed necessary by the treating Practitioner. I acknowledge and consent to receive the Services by the Practitioner via telemedicine. I understand that the telemedicine visit will involve communicating with the Practitioner through live audiovisual communication technology and the disclosure of certain medical information by electronic transmission. I acknowledge that I have been given the opportunity to request an in-person assessment or other available alternative prior to the telemedicine visit and am voluntarily participating in the telemedicine visit.  I understand that I have the right to withhold or withdraw my consent to the use of telemedicine in the course of my care at any time, without affecting my right to future care or treatment, and that the Practitioner or I may terminate the telemedicine visit at any time. I understand that I have the right to inspect all information obtained and/or recorded in the course of the telemedicine visit and may receive copies of available information for a reasonable fee.  I  understand that some of the potential risks of receiving the Services via telemedicine include:  Delay or interruption in medical evaluation due to technological equipment failure or disruption; Information transmitted may not be sufficient (e.g. poor resolution of images) to allow for appropriate medical decision making by the Practitioner; and/or  In rare instances, security protocols could fail, causing a breach of personal health information.  Furthermore, I acknowledge that it is my responsibility to provide information about my medical history, conditions and care that is complete and accurate to the best of my ability. I acknowledge that Practitioner's advice, recommendations, and/or decision may be based on factors not within their control, such as incomplete or inaccurate data provided by me or distortions of diagnostic images or specimens that may result from electronic transmissions. I understand that the practice of medicine is not an exact science and that Practitioner makes no warranties or guarantees regarding treatment outcomes. I acknowledge that a copy of this consent can be made available to me via my patient portal ([facility]), or I can request a printed copy by calling the office of [facility].    I understand that my insurance will be billed for this visit.   I have read or had this consent read to me. I understand the contents of this consent, which adequately explains the benefits and risks of the Services being provided via telemedicine.  I have been provided ample opportunity to ask questions regarding this consent and the Services and have had my questions answered to my satisfaction. I give my informed consent for  the services to be provided through the use of telemedicine in my medical care

## 2021-12-29 NOTE — Telephone Encounter (Signed)
   Name: Peter Howe  DOB: Nov 23, 1940  MRN: [id number]  Primary Cardiologist: None   Preoperative team, please contact this patient and set up a phone call appointment for further preoperative risk assessment. Please obtain consent and complete medication review. Thank you for your help.  I confirm that guidance regarding antiplatelet and oral anticoagulation therapy has been completed and, if necessary, noted below.  Per office protocol, patient can hold Xarelto for 2 days prior to procedure.   Patient will not need bridging with Lovenox (enoxaparin) around procedure.     Deberah Pelton, NP 12/29/2021, 8:38 AM [place]

## 2021-12-29 NOTE — Telephone Encounter (Signed)
Pt agreeable to plan of care for tele pre op appt 01/16/22 @ 3 pm. Med rec and consent are done.   Pt also states he is going to need to move his appt with Dr. Quentin Ore due to colonoscopy is same day as Dr. Quentin Ore. I assured the pt that I will have the EP scheduler all him with a new date for Dr. Quentin Ore appt. Pt said thank you.

## 2022-01-12 DIAGNOSIS — Z85828 Personal history of other malignant neoplasm of skin: Secondary | ICD-10-CM | POA: Diagnosis not present

## 2022-01-12 DIAGNOSIS — L570 Actinic keratosis: Secondary | ICD-10-CM | POA: Diagnosis not present

## 2022-01-12 DIAGNOSIS — X32XXXD Exposure to sunlight, subsequent encounter: Secondary | ICD-10-CM | POA: Diagnosis not present

## 2022-01-12 DIAGNOSIS — Z08 Encounter for follow-up examination after completed treatment for malignant neoplasm: Secondary | ICD-10-CM | POA: Diagnosis not present

## 2022-01-14 ENCOUNTER — Encounter: Payer: Self-pay | Admitting: Gastroenterology

## 2022-01-14 ENCOUNTER — Encounter: Payer: Self-pay | Admitting: Certified Registered Nurse Anesthetist

## 2022-01-14 NOTE — Progress Notes (Unsigned)
Virtual Visit via Telephone Note   Because of Peter Howe's co-morbid illnesses, he is at least at moderate risk for complications without adequate follow up.  This format is felt to be most appropriate for this patient at this time.  The patient did not have access to video technology/had technical difficulties with video requiring transitioning to audio format only (telephone).  All issues noted in this document were discussed and addressed.  No physical exam could be performed with this format.  Please refer to the patient's chart for his consent to telehealth for [facility].  Evaluation Performed:  Preoperative cardiovascular risk assessment _____________   Date:  01/14/2022   Patient ID:  Peter Howe, DOB 10/18/1940, MRN [id number] Patient Location:  Home Provider location:   Office  Primary Care Provider:  Loman Brooklyn, FNP Primary Cardiologist:  None  Chief Complaint / Patient Profile   81 y.o. y/o male with a h/o sick sinus syndrome s/p PPM implant 1998, [place] lead 2005, most recent generator change 2020, permanent atrial fibrillation on chronic Xarelto, HTN,  who is pending colonoscopy and presents today for telephonic preoperative cardiovascular risk assessment.  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 Tawanna Solo TUNNEL RELEASE Left 2022   [place]   03/28/2003   St. Jude-PPM   PPM GENERATOR CHANGEOUT N/A 11/24/2018   Procedure: PPM GENERATOR CHANGEOUT;  Surgeon: [name], MD;  Location: [facility];  Service: Cardiovascular;  Laterality: N/A;    Allergies  Allergies  Allergen Reactions   Morphine And Related Other (See Comments)    Patient states it wired him up and didn't help. Prefers not to take again.   Penicillins Rash    Has patient had a PCN reaction causing immediate rash, facial/tongue/throat swelling, SOB or lightheadedness with hypotension: No Has patient had a PCN reaction causing severe rash involving mucus membranes or skin necrosis: No Has patient had a PCN reaction that required hospitalization No Has patient had a PCN reaction occurring within the last 10 years: No If all of the above answers are "NO", then may proceed with Cephalosporin use.    Statins    Sulfonamide Derivatives Rash    History of Present Illness    Peter Howe is a 81 y.o. male who presents via audio/video conferencing for a telehealth visit today.  Pt was last seen in cardiology clinic on 08/01/2021 by Oda Kilts, [place].  At that time Peter Howe was doing well. The patient is now pending procedure as outlined above. Since his last visit, he  denies chest pain, shortness of breath, lower extremity edema, fatigue, palpitations, melena, hematuria, hemoptysis, diaphoresis, weakness, presyncope, syncope, orthopnea, and PND. He remains active, walking 45 minutes daily without  concerning cardiac symptoms.   Home Medications    Prior to Admission medications   Medication Sig Start Date End Date Taking? Authorizing Provider  acetaminophen (TYLENOL) 325 MG tablet Take 650 mg by mouth every 6 (six) hours as needed for mild pain.    [provider]  atenolol (TENORMIN) 50 MG tablet Take 1 tablet (50 mg total) by mouth daily. 12/25/21   Vickie Epley, MD  colchicine 0.6 MG tablet Take 1.2 mg (2 tablets) by mouth at the  first sign of a gout flare followed by 0.6 mg (1 tablet) one hour later. 10/30/21   Loman Brooklyn, FNP  losartan (COZAAR) 50 MG tablet Take 1 tablet (50 mg total) by mouth daily. 10/30/21   Loman Brooklyn, FNP  Multiple Vitamin (MULTIVITAMIN WITH MINERALS) TABS tablet Take 1 tablet by mouth daily.    [provider]  nitroGLYCERIN (NITROSTAT) 0.4 MG SL tablet Place 1 tablet under the tongue every 5 (five) minutes as needed for chest pain. 10/01/20   Allred, Jeneen Rinks, MD  rivaroxaban (XARELTO) 20 MG TABS tablet Take 1 tablet (20 mg total) by mouth daily with supper. 10/20/21   Allred, Jeneen Rinks, MD  rosuvastatin (CRESTOR) 5 MG tablet Take 1 tablet (5 mg total) by mouth once a week. 07/15/21   Loman Brooklyn, FNP  sertraline (ZOLOFT) 25 MG tablet Take 1 tablet (25 mg total) by mouth daily. 10/30/21   Loman Brooklyn, FNP  zolpidem (AMBIEN) 5 MG tablet Take 1 tablet (5 mg total) by mouth at bedtime. 10/30/21   Loman Brooklyn, FNP    Physical Exam    Vital Signs:  Peter Howe does not have vital signs available for review today.  Given telephonic nature of communication, physical exam is limited. AAOx3. NAD. Normal affect.  Speech and respirations are unlabored.  Accessory Clinical Findings    None  Assessment & Plan    1.  Preoperative Cardiovascular Risk Assessment: The patient is doing well from a cardiac perspective. Therefore, based on ACC/AHA guidelines, the patient would be at acceptable risk for the planned procedure without further cardiovascular testing.  According to the Revised Cardiac Risk Index (RCRI), his Perioperative Risk of Major Cardiac Event is (%): 0.4. His Functional Capacity in METs is: 7.04 according to the [facility] Activity Status Index (DASI).  The patient was advised that if he develops new symptoms prior to surgery to contact our office to arrange for a follow-up visit, and he verbalized understanding.  Per office protocol, patient can hold Xarelto for 2 days  prior to procedure.   Patient will not need bridging with Lovenox (enoxaparin) around procedure.  A copy of this note will be routed to requesting surgeon.  Time:   Today, I have spent 7 minutes with the patient with telehealth technology discussing medical history, symptoms, and management plan.     Emmaline Life, NP-C  01/15/2022, 3:02 PM [address], [address] Office [phone] Fax [phone]

## 2022-01-15 ENCOUNTER — Encounter: Payer: Self-pay | Admitting: Nurse Practitioner

## 2022-01-15 ENCOUNTER — Ambulatory Visit: Payer: Medicare HMO | Attending: Internal Medicine | Admitting: Nurse Practitioner

## 2022-01-15 DIAGNOSIS — Z01810 Encounter for preprocedural cardiovascular examination: Secondary | ICD-10-CM

## 2022-01-20 ENCOUNTER — Ambulatory Visit (AMBULATORY_SURGERY_CENTER): Payer: Medicare HMO | Admitting: Gastroenterology

## 2022-01-20 ENCOUNTER — Encounter: Payer: Medicare HMO | Admitting: Cardiology

## 2022-01-20 ENCOUNTER — Encounter: Payer: Self-pay | Admitting: Gastroenterology

## 2022-01-20 VITALS — BP 136/81 | HR 68 | Temp 99.1°F | Resp 12 | Ht 64.0 in | Wt 180.0 lb

## 2022-01-20 DIAGNOSIS — D128 Benign neoplasm of rectum: Secondary | ICD-10-CM | POA: Diagnosis not present

## 2022-01-20 DIAGNOSIS — I10 Essential (primary) hypertension: Secondary | ICD-10-CM | POA: Diagnosis not present

## 2022-01-20 DIAGNOSIS — I2510 Atherosclerotic heart disease of native coronary artery without angina pectoris: Secondary | ICD-10-CM | POA: Diagnosis not present

## 2022-01-20 DIAGNOSIS — Z09 Encounter for follow-up examination after completed treatment for conditions other than malignant neoplasm: Secondary | ICD-10-CM

## 2022-01-20 DIAGNOSIS — D124 Benign neoplasm of descending colon: Secondary | ICD-10-CM | POA: Diagnosis not present

## 2022-01-20 DIAGNOSIS — Z86010 Personal history of colonic polyps: Secondary | ICD-10-CM | POA: Diagnosis not present

## 2022-01-20 DIAGNOSIS — D122 Benign neoplasm of ascending colon: Secondary | ICD-10-CM | POA: Diagnosis not present

## 2022-01-20 MED ORDER — SODIUM CHLORIDE 0.9 % IV SOLN: 500.0000 mL | INTRAVENOUS | Status: DC

## 2022-01-20 NOTE — Progress Notes (Signed)
Pt's states no medical or surgical changes since previsit or office visit. 

## 2022-01-20 NOTE — Progress Notes (Signed)
Indication for colonoscopy:  History of colon polyps    - tubular adenoma seen on pathology reports from 2000, 2003, and 2006    - 10 TA and one TVA on colonoscopy 2023 Possible family history of colon cancer (mother)  Please see my 12/23/2021 office note for complete details.  There is been no significant change in history or physical exam since that time.  He remains an appropriate candidate for monitored anesthesia care in the [facility].

## 2022-01-20 NOTE — Op Note (Signed)
[facility] Patient Name: Peter Howe Procedure Date: 01/20/2022 2:00 PM MRN: [id number] Endoscopist: Thornton Park MD, MD, [id number] Age: 81 Referring MD:  Date of Birth: 03-20-1940 Gender: Male Account #: [id number] Procedure:                Colonoscopy Indications:              High risk colon cancer surveillance: Personal                            history of adenoma with villous component                           - tubular adenoma seen on pathology reports from                            2000, 2003, and 2006                           - 10 TA and one TVA on colonoscopy 2023                           Possible family history of colon cancer (mother) Medicines:                Monitored Anesthesia Care Procedure:                Pre-Anesthesia Assessment:                           - Prior to the procedure, a History and Physical                            was performed, and patient medications and                            allergies were reviewed. The patient's tolerance of                            previous anesthesia was also reviewed. The risks                            and benefits of the procedure and the sedation                            options and risks were discussed with the patient.                            All questions were answered, and informed consent                            was obtained. Prior Anticoagulants: The patient has                            taken Xarelto (rivaroxaban), last dose was 3 days  prior to procedure. ASA Grade Assessment: III - A                            patient with severe systemic disease. After                            reviewing the risks and benefits, the patient was                            deemed in satisfactory condition to undergo the                            procedure.                           After obtaining informed consent, the colonoscope                            was  passed under direct vision. Throughout the                            procedure, the patient's blood pressure, pulse, and                            oxygen saturations were monitored continuously. The                            Olympus CF-HQ190L [id number]) Colonoscope was                            introduced through the anus and advanced to the 3                            cm into the ileum. A second forward view of the                            right colon was performed. The colonoscopy was                            performed without difficulty. The patient tolerated                            the procedure well. The quality of the bowel                            preparation was good. The terminal ileum, ileocecal                            valve, appendiceal orifice, and rectum were                            photographed. Scope In: 2:05:29 PM Scope Out: 2:22:31 PM Scope Withdrawal Time: 0 hours 14 minutes 13 seconds  Total Procedure Duration: 0 hours 17 minutes  2 seconds  Findings:                 The perianal and digital rectal examinations were                            normal.                           Non-bleeding internal hemorrhoids were found.                           Multiple medium-mouthed and small-mouthed                            diverticula were found in the sigmoid colon,                            descending colon and ascending colon.                           A 2 mm polyp was found in the rectum. The polyp was                            flat. The polyp was removed with a cold snare.                            Resection and retrieval were complete. Estimated                            blood loss was minimal.                           A 2 mm polyp was found in the descending colon. The                            polyp was sessile. The polyp was removed with a                            cold snare. Resection and retrieval were complete.                             Estimated blood loss was minimal.                           A 1 mm polyp was found in the ascending colon. The                            polyp was sessile. The polyp was removed with a                            cold snare. Resection and retrieval were complete.                            Estimated blood loss was minimal.  The exam was otherwise without abnormality on                            direct and retroflexion views. Complications:            No immediate complications. Estimated Blood Loss:     Estimated blood loss was minimal. Impression:               - Non-bleeding internal hemorrhoids.                           - Diverticulosis in the sigmoid colon, in the                            descending colon and in the ascending colon.                           - One 2 mm polyp in the rectum, removed with a cold                            snare. Resected and retrieved.                           - One 2 mm polyp in the descending colon, removed                            with a cold snare. Resected and retrieved.                           - One 1 mm polyp in the ascending colon, removed                            with a cold snare. Resected and retrieved.                           - The examination was otherwise normal on direct                            and retroflexion views. Recommendation:           - Patient has a contact number available for                            emergencies. The signs and symptoms of potential                            delayed complications were discussed with the                            patient. Return to normal activities tomorrow.                            Written discharge instructions were provided to the  patient.                           - High fiber diet.                           - Continue present medications.                           - Resume Xarelto (rivaroxaban) at prior dose in 2                             days.                           - Await pathology results.                           - No repeat colonoscopy due to age.                           - Follow a high fiber diet. Drink at least 64                            ounces of water daily. Add a daily stool bulking                            agent such as psyllium (an exampled would be                            Metamucil).                           - Emerging evidence supports eating a diet of                            fruits, vegetables, grains, calcium, and yogurt                            while reducing red meat and alcohol may reduce the                            risk of colon cancer.                           - Thank you for allowing me to be involved in your                            colon cancer prevention. Thornton Park MD, MD 01/20/2022 2:27:48 PM This report has been signed electronically.

## 2022-01-20 NOTE — Progress Notes (Signed)
Report given to PACU, vss 

## 2022-01-20 NOTE — Patient Instructions (Signed)
Handouts given on polyps, diverticulosis and hemorrhoids.  Restart your Xarelto in 2 days.  YOU HAD AN ENDOSCOPIC PROCEDURE TODAY AT [facility]:   Refer to the procedure report that was given to you for any specific questions about what was found during the examination.  If the procedure report does not answer your questions, please call your gastroenterologist to clarify.  If you requested that your care partner not be given the details of your procedure findings, then the procedure report has been included in a sealed envelope for you to review at your convenience later.  YOU SHOULD EXPECT: Some feelings of bloating in the abdomen. Passage of more gas than usual.  Walking can help get rid of the air that was put into your GI tract during the procedure and reduce the bloating. If you had a lower endoscopy (such as a colonoscopy or flexible sigmoidoscopy) you may notice spotting of blood in your stool or on the toilet paper. If you underwent a bowel prep for your procedure, you may not have a normal bowel movement for a few days.  Please Note:  You might notice some irritation and congestion in your nose or some drainage.  This is from the oxygen used during your procedure.  There is no need for concern and it should clear up in a day or so.  SYMPTOMS TO REPORT IMMEDIATELY:  Following lower endoscopy (colonoscopy or flexible sigmoidoscopy):  Excessive amounts of blood in the stool  Significant tenderness or worsening of abdominal pains  Swelling of the abdomen that is new, acute  Fever of 100F or higher   For urgent or emergent issues, a gastroenterologist can be reached at any hour by calling [phone]. Do not use MyChart messaging for urgent concerns.    DIET:  We do recommend a small meal at first, but then you may proceed to your regular diet.  Drink plenty of fluids but you should avoid alcoholic beverages for 24 hours.  ACTIVITY:  You should plan to take it easy  for the rest of today and you should NOT DRIVE or use heavy machinery until tomorrow (because of the sedation medicines used during the test).    FOLLOW UP: Our staff will call the number listed on your records the next business day following your procedure.  We will call around 7:15- 8:00 am to check on you and address any questions or concerns that you may have regarding the information given to you following your procedure. If we do not reach you, we will leave a message.     If any biopsies were taken you will be contacted by phone or by letter within the next 1-3 weeks.  Please call us at [phone] if you have not heard about the biopsies in 3 weeks.    SIGNATURES/CONFIDENTIALITY: You and/or your care partner have signed paperwork which will be entered into your electronic medical record.  These signatures attest to the fact that that the information above on your After Visit Summary has been reviewed and is understood.  Full responsibility of the confidentiality of this discharge information lies with you and/or your care-partner.

## 2022-01-21 ENCOUNTER — Telehealth: Payer: Self-pay

## 2022-01-21 NOTE — Telephone Encounter (Signed)
  Follow up Call-     01/20/2022    1:11 PM 12/20/2020    9:42 AM  Call back number  Post procedure Call Back phone  # [phone]  Permission to leave phone message Yes Yes     Patient questions:  Do you have a fever, pain , or abdominal swelling? No. Pain Score  0 *  Have you tolerated food without any problems? Yes.    Have you been able to return to your normal activities? Yes.    Do you have any questions about your discharge instructions: Diet   No. Medications  No. Follow up visit  No.  Do you have questions or concerns about your Care? No.  Actions: * If pain score is 4 or above: No action needed, pain <4.

## 2022-01-26 ENCOUNTER — Encounter: Payer: Self-pay | Admitting: Gastroenterology

## 2022-02-03 ENCOUNTER — Ambulatory Visit (INDEPENDENT_AMBULATORY_CARE_PROVIDER_SITE_OTHER): Payer: Medicare HMO

## 2022-02-03 DIAGNOSIS — I495 Sick sinus syndrome: Secondary | ICD-10-CM

## 2022-02-03 LAB — CUP PACEART REMOTE DEVICE CHECK
Battery Remaining Longevity: 104 mo
Battery Remaining Percentage: 79 %
Battery Voltage: 3.02 V
Brady Statistic RV Percent Paced: 59 %
Date Time Interrogation Session: 20231128054233
Implantable Lead Connection Status: 753985
Implantable Lead Connection Status: 753985
Implantable Lead Implant Date: 19980120
Implantable Lead Implant Date: 20050119
Implantable Lead Location: 753859
Implantable Lead Location: 753860
Implantable Pulse Generator Implant Date: 20200917
Lead Channel Impedance Value: 640 Ohm
Lead Channel Pacing Threshold Amplitude: 0.5 V
Lead Channel Pacing Threshold Pulse Width: 0.4 ms
Lead Channel Sensing Intrinsic Amplitude: 12 mV
Lead Channel Setting Pacing Amplitude: 2.5 V
Lead Channel Setting Pacing Pulse Width: 0.4 ms
Lead Channel Setting Sensing Sensitivity: 2 mV
Pulse Gen Model: 2272
Pulse Gen Serial Number: 9154140

## 2022-02-12 DIAGNOSIS — R059 Cough, unspecified: Secondary | ICD-10-CM | POA: Diagnosis not present

## 2022-02-12 DIAGNOSIS — J069 Acute upper respiratory infection, unspecified: Secondary | ICD-10-CM | POA: Diagnosis not present

## 2022-02-13 ENCOUNTER — Ambulatory Visit: Payer: Medicare HMO

## 2022-02-15 NOTE — Progress Notes (Unsigned)
Electrophysiology Office Follow up Visit Note:    Date:  02/15/2022   ID:  Peter Howe, Peter Howe 1941/01/13, MRN [id number]  PCP:  Baruch Gouty, [facility] Cardiologist:  None  [facility] Electrophysiologist:  Vickie Epley, MD    Interval History:    Peter Howe is a 81 y.o. male who presents for a follow up visit. They were previously seen by Dr Rayann Heman. He was last seen by Jonni Sanger 08/01/2021. The patient is on xarelto for his rate controlled AF. He has a PPM functioning well for a history of symptomatic bradycardia.   Past Medical History:  Diagnosis Date   Anxiety    on meds   Aortic atherosclerosis ([place])    Arthritis    generalized   Bilateral inguinal hernia 01/10/2021   Cancer (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 Tawanna Solo TUNNEL RELEASE Left 2022   [place]  03/28/2003   St. Jude-PPM   PPM GENERATOR CHANGEOUT N/A 11/24/2018   Procedure: PPM GENERATOR CHANGEOUT;  Surgeon: [name], MD;  Location: [facility];  Service: Cardiovascular;  Laterality: N/A;    Current Medications: No outpatient medications have been marked as taking for the 02/16/22 encounter (Appointment) with Vickie Epley, MD.     Allergies:   Morphine and related, Penicillins, Statins, and Sulfonamide derivatives   Social History   Socioeconomic History   Marital status: Widowed    Spouse name: Not on file   Number of children: 1   Years of education: 12   Highest education level: High school graduate  Occupational History   Occupation: runs a  Engineer, technical sales    Comment: part time  Tobacco Use   Smoking status: Former    Packs/day: 1.00    Years: 30.00    Total pack years: 30.00    Types: Cigarettes    Start date: 03/30/1955    Quit date: 03/09/1993    Years since quitting: 28.9    Passive exposure: Past   Smokeless tobacco: Never  Vaping Use   Vaping Use: Never used  Substance and Sexual Activity   Alcohol use: Yes    Alcohol/week: 7.0 standard drinks of alcohol    Types: 1 Glasses of wine, 4 Cans of beer, 2 Shots of liquor per week   Drug use: No   Sexual activity: Not Currently    Partners: Female  Other Topics Concern   Not on file  Social History Narrative   Owns a Engineer, technical sales, enjoys golf.   Lives in [place] lives 5 miles away   Social Determinants of Health   Financial Resource Strain: Low Risk  (12/08/2021)   Overall Financial Resource Strain (CARDIA)    Difficulty of Paying Living Expenses: Not hard at all  Food Insecurity: No Food Insecurity (12/08/2021)   Hunger Vital Sign    Worried About Running Out of Food in the Last Year: Never true    Ran Out of  Food in the Last Year: Never true  Transportation Needs: No Transportation Needs (12/08/2021)   PRAPARE - Hydrologist (Medical): No    Lack of Transportation (Non-Medical): No  Physical Activity: Sufficiently Active (12/08/2021)   Exercise Vital Sign    Days of Exercise per Week: 5 days    Minutes of Exercise per Session: 60 min  Stress: No Stress Concern Present (12/08/2021)   [place]    Feeling of Stress : Not at all  Social Connections: Moderately Integrated (12/08/2021)   Social Connection and Isolation Panel [NHANES]    Frequency of Communication with Friends and Family: More than three times a week    Frequency of Social Gatherings with Friends and Family: More than three times a week    Attends Religious Services: More than 4 times per  year    Active Member of [employer] or Organizations: Yes    Attends Archivist Meetings: More than 4 times per year    Marital Status: Widowed     Family History: The patient's family history includes Alcohol abuse in his brother; Breast cancer in his sister, sister, sister, and sister; CAD (age of onset: 66) in his father; Cancer in his brother; Cancer (age of onset: 50) in his mother; Diabetes in his brother; Heart disease in his daughter; Stroke in his sister. There is no history of Colon polyps, Colon cancer, Esophageal cancer, Stomach cancer, or Rectal cancer.  ROS:   Please see the history of present illness.    All other systems reviewed and are negative.  EKGs/Labs/Other Studies Reviewed:    The following studies were reviewed today:  02/16/2022 in clinic device interrogation personally reviewed ***    Recent Labs: 10/30/2021: ALT 21; BUN 15; Creatinine, Ser 1.29; Hemoglobin 15.6; Platelets 182; Potassium 5.2; Sodium 142  Recent Lipid Panel    Component Value Date/Time   CHOL 189 10/30/2021 0953   TRIG 54 10/30/2021 0953   HDL 108 10/30/2021 0953   CHOLHDL 1.8 10/30/2021 0953   CHOLHDL 2.4 09/29/2020 0609   VLDL 10 09/29/2020 0609   LDLCALC 71 10/30/2021 0953    Physical Exam:    VS:  There were no vitals taken for this visit.    Wt Readings from Last 3 Encounters:  01/20/22 180 lb (81.6 kg)  12/23/21 180 lb 6.4 oz (81.8 kg)  10/30/21 178 lb 6.4 oz (80.9 kg)     GEN: *** Well nourished, well developed in no acute distress HEENT: Normal NECK: No JVD; No carotid bruits LYMPHATICS: No lymphadenopathy CARDIAC: ***RRR, no murmurs, rubs, gallops. Prepectoral pocket well healed. RESPIRATORY:  Clear to auscultation without rales, wheezing or rhonchi  ABDOMEN: Soft, non-tender, non-distended MUSCULOSKELETAL:  No edema; No deformity  SKIN: Warm and dry NEUROLOGIC:  Alert and oriented x 3 PSYCHIATRIC:  Normal affect        ASSESSMENT:    1.  Symptomatic bradycardia   2. Permanent atrial fibrillation (HCC)   3. Cardiac pacemaker in situ    PLAN:    In order of problems listed above:  #Symptomatic bradycardia #PPM in situ Device functioning appropriately. Continue remote monitoring.  #perm AF On xarelto for stroke ppx. Rate controlled.  Follow up 1 year with APP.     Medication Adjustments/Labs and Tests Ordered: Current medicines are reviewed at length with the patient today.  Concerns regarding medicines are outlined above.  No orders of the defined types were  placed in this encounter.  No orders of the defined types were placed in this encounter.    Signed, Lars Mage, MD, [facility], [facility] 02/15/2022 8:25 PM    Electrophysiology [facility]

## 2022-02-16 ENCOUNTER — Ambulatory Visit: Payer: Medicare HMO | Attending: Cardiology | Admitting: Cardiology

## 2022-02-16 ENCOUNTER — Encounter: Payer: Self-pay | Admitting: Cardiology

## 2022-02-16 VITALS — BP 136/70 | HR 63 | Ht 64.0 in | Wt 182.0 lb

## 2022-02-16 DIAGNOSIS — R001 Bradycardia, unspecified: Secondary | ICD-10-CM | POA: Diagnosis not present

## 2022-02-16 DIAGNOSIS — I4821 Permanent atrial fibrillation: Secondary | ICD-10-CM | POA: Diagnosis not present

## 2022-02-16 DIAGNOSIS — Z950 Presence of cardiac pacemaker: Secondary | ICD-10-CM | POA: Diagnosis not present

## 2022-02-16 NOTE — Patient Instructions (Signed)
Medication Instructions:  Your physician recommends that you continue on your current medications as directed. Please refer to the Current Medication list given to you today.  *If you need a refill on your cardiac medications before your next appointment, please call your pharmacy*  Follow-Up: At [facility], you and your health needs are our priority.  As part of our continuing mission to provide you with exceptional heart care, we have created designated Provider Care Teams.  These Care Teams include your primary Cardiologist (physician) and Advanced Practice Providers (APPs -  Physician Assistants and Nurse Practitioners) who all work together to provide you with the care you need, when you need it.  Your next appointment:   1 year(s)  The format for your next appointment:   In Person  Provider:   You may see Vickie Epley, MD or one of the following Advanced Practice Providers on your designated Care Team:   Tommye Standard, [place] Legrand Como "Jonni Sanger" Chalmers Cater, [place]    Important Information About Sugar

## 2022-02-16 NOTE — Progress Notes (Signed)
Electrophysiology Office Follow up Visit Note:    Date:  02/16/2022   ID:  Peter Howe, DOB Mar 09, 1941, MRN [id number]  PCP:  Baruch Gouty, [facility] Cardiologist:  None  [facility] Electrophysiologist:  Vickie Epley, MD    Interval History:    Peter Howe is a 81 y.o. male who presents for a follow up visit. They were previously seen by Dr Rayann Heman. He was last seen by Jonni Sanger 08/01/2021. The patient is on xarelto for his rate controlled AF. He has a PPM functioning well for a history of symptomatic bradycardia.  Today, he states he is feeling well aside from seasonal allergies. Also he endorses mild LE edema.  He remains compliant with Xarelto. Every once in a while he notices mild hemoptysis or hematochezia.   Even with taking rosuvastatin 5 mg once a week, he still developed myalgias. He is no longer taking rosuvastatin.  He denies any palpitations, chest pain, or shortness of breath. No lightheadedness, headaches, syncope, orthopnea, or PND.      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 Tawanna Solo TUNNEL RELEASE Left 2022   [place]  03/28/2003   St. Jude-PPM   PPM GENERATOR CHANGEOUT N/A 11/24/2018   Procedure: PPM GENERATOR CHANGEOUT;  Surgeon: [name], MD;  Location: [facility];  Service: Cardiovascular;  Laterality: N/A;    Current Medications: Current Meds   Medication Sig   acetaminophen (TYLENOL) 325 MG tablet Take 650 mg by mouth every 6 (six) hours as needed for mild pain.   atenolol (TENORMIN) 50 MG tablet Take 1 tablet (50 mg total) by mouth daily.   benzonatate (TESSALON) 100 MG capsule Take 1 to 2 capsules every 8 hours as needed for cough.  Swallow whole.   colchicine 0.6 MG tablet Take 1.2 mg (2 tablets) by mouth at the first sign of a gout flare followed by 0.6 mg (1 tablet) one hour later.   losartan (COZAAR) 50 MG tablet Take 1 tablet (50 mg total) by mouth daily.   Multiple Vitamin (MULTIVITAMIN WITH MINERALS) TABS tablet Take 1 tablet by mouth daily.   nitroGLYCERIN (NITROSTAT) 0.4 MG SL tablet Place 1 tablet under the tongue every 5 (five) minutes as needed for chest pain.   rivaroxaban (XARELTO) 20 MG TABS tablet Take 1 tablet (20 mg total) by mouth daily with supper.   sertraline (ZOLOFT) 25 MG tablet Take 1 tablet (25 mg total) by mouth daily.   zolpidem (AMBIEN) 5 MG tablet Take 1 tablet (5 mg total) by mouth at bedtime.     Allergies:   Morphine and related, Penicillins, Statins, and Sulfonamide derivatives   Social History   Socioeconomic History   Marital status: Widowed    Spouse name: Not on  file   Number of children: 1   Years of education: 12   Highest education level: High school graduate  Occupational History   Occupation: runs a Engineer, technical sales    Comment: part time  Tobacco Use   Smoking status: Former    Packs/day: 1.00    Years: 30.00    Total pack years: 30.00    Types: Cigarettes    Start date: 03/30/1955    Quit date: 03/09/1993    Years since quitting: 28.9    Passive exposure: Past   Smokeless tobacco: Never  Vaping Use   Vaping Use: Never used  Substance and Sexual Activity   Alcohol use: Yes    Alcohol/week: 7.0 standard drinks of alcohol    Types: 1 Glasses of wine, 4 Cans of beer, 2 Shots of liquor per week   Drug use: No   Sexual activity: Not Currently    Partners: Female  Other  Topics Concern   Not on file  Social History Narrative   Owns a Engineer, technical sales, enjoys golf.   Lives in [place] lives 5 miles away   Social Determinants of Health   Financial Resource Strain: Low Risk  (12/08/2021)   Overall Financial Resource Strain (CARDIA)    Difficulty of Paying Living Expenses: Not hard at all  Food Insecurity: No Food Insecurity (12/08/2021)   Hunger Vital Sign    Worried About Running Out of Food in the Last Year: Never true    Ran Out of Food in the Last Year: Never true  Transportation Needs: No Transportation Needs (12/08/2021)   PRAPARE - Hydrologist (Medical): No    Lack of Transportation (Non-Medical): No  Physical Activity: Sufficiently Active (12/08/2021)   Exercise Vital Sign    Days of Exercise per Week: 5 days    Minutes of Exercise per Session: 60 min  Stress: No Stress Concern Present (12/08/2021)   [place]    Feeling of Stress : Not at all  Social Connections: Moderately Integrated (12/08/2021)   Social Connection and Isolation Panel [NHANES]    Frequency of Communication with Friends and Family: More than three times a week    Frequency of Social Gatherings with Friends and Family: More than three times a week    Attends Religious Services: More than 4 times per year    Active Member of [employer] or Organizations: Yes    Attends Archivist Meetings: More than 4 times per year    Marital Status: Widowed     Family History: The patient's family history includes Alcohol abuse in his brother; Breast cancer in his sister, sister, sister, and sister; CAD (age of onset: 8) in his father; Cancer in his brother; Cancer (age of onset: 65) in his mother; Diabetes in his brother; Heart disease in his daughter; Stroke in his sister. There is no history of Colon polyps, Colon cancer, Esophageal cancer, Stomach cancer, or Rectal cancer.  ROS:    Please see the history of present illness.    (+) Seasonal allergies (+) Mild LE edema (+) Occasional hemoptysis, hematochezia. All other systems reviewed and are negative.  EKGs/Labs/Other Studies Reviewed:    The following studies were reviewed today:  02/16/2022 in clinic device interrogation personally reviewed Battery longevity 8.7 years Lead parameters stable VP 59% No HVR    Recent Labs: 10/30/2021: ALT 21; BUN 15; Creatinine, Ser 1.29; Hemoglobin 15.6; Platelets 182; Potassium  5.2; Sodium 142   Recent Lipid Panel    Component Value Date/Time   CHOL 189 10/30/2021 0953   TRIG 54 10/30/2021 0953   HDL 108 10/30/2021 0953   CHOLHDL 1.8 10/30/2021 0953   CHOLHDL 2.4 09/29/2020 0609   VLDL 10 09/29/2020 0609   LDLCALC 71 10/30/2021 0953    Physical Exam:    VS:  BP 136/70   Pulse 63   Ht '5\' 4"'$  (1.626 m)   Wt 182 lb (82.6 kg)   SpO2 97%   BMI 31.24 kg/m     Wt Readings from Last 3 Encounters:  02/16/22 182 lb (82.6 kg)  01/20/22 180 lb (81.6 kg)  12/23/21 180 lb 6.4 oz (81.8 kg)     GEN: Well nourished, well developed in no acute distress HEENT: Normal NECK: No JVD; No carotid bruits LYMPHATICS: No lymphadenopathy CARDIAC: RRR, no murmurs, rubs, gallops. Prepectoral pocket well healed. RESPIRATORY:  Clear to auscultation without rales, wheezing or rhonchi  ABDOMEN: Soft, non-tender, non-distended MUSCULOSKELETAL:  No edema; No deformity  SKIN: Warm and dry NEUROLOGIC:  Alert and oriented x 3 PSYCHIATRIC:  Normal affect        ASSESSMENT:    1. Symptomatic bradycardia   2. Permanent atrial fibrillation (HCC)   3. Cardiac pacemaker in situ    PLAN:    In order of problems listed above:  #Symptomatic bradycardia #PPM in situ Device functioning appropriately. Continue remote monitoring.  #perm AF On xarelto for stroke ppx. Rate controlled.  Follow up 1 year with APP.   Medication Adjustments/Labs and Tests Ordered: Current  medicines are reviewed at length with the patient today.  Concerns regarding medicines are outlined above.   No orders of the defined types were placed in this encounter.  No orders of the defined types were placed in this encounter.  I,Mathew Stumpf,acting as a Education administrator for Vickie Epley, MD.,have documented all relevant documentation on the behalf of Vickie Epley, MD,as directed by  Vickie Epley, MD while in the presence of Vickie Epley, MD.  I, Vickie Epley, MD, have reviewed all documentation for this visit. The documentation on 02/16/22 for the exam, diagnosis, procedures, and orders are all accurate and complete.   Signed, Lars Mage, MD, [facility], [facility] 02/16/2022 3:27 PM    Electrophysiology [facility]

## 2022-02-20 ENCOUNTER — Encounter: Payer: Self-pay | Admitting: Cardiology

## 2022-02-24 ENCOUNTER — Ambulatory Visit (INDEPENDENT_AMBULATORY_CARE_PROVIDER_SITE_OTHER): Payer: Medicare HMO | Admitting: Family Medicine

## 2022-02-24 ENCOUNTER — Encounter: Payer: Self-pay | Admitting: Family Medicine

## 2022-02-24 DIAGNOSIS — J069 Acute upper respiratory infection, unspecified: Secondary | ICD-10-CM

## 2022-02-24 MED ORDER — PREDNISONE 20 MG PO TABS: ORAL_TABLET | ORAL | 0 refills | Status: DC

## 2022-02-24 MED ORDER — DOXYCYCLINE HYCLATE 100 MG PO TABS: 100.0000 mg | ORAL_TABLET | Freq: Two times a day (BID) | ORAL | 0 refills | Status: AC

## 2022-02-24 NOTE — Progress Notes (Signed)
Virtual Visit via telephone Note Due to COVID-19 pandemic this visit was conducted virtually. This visit type was conducted due to national recommendations for restrictions regarding the COVID-19 Pandemic (e.g. social distancing, sheltering in place) in an effort to limit this patient's exposure and mitigate transmission in our community. All issues noted in this document were discussed and addressed.  A physical exam was not performed with this format.   I connected with Peter Howe on 02/24/2022 at [facility] by telephone and verified that I am speaking with the correct person using two identifiers. Peter Howe is currently located at home and patient is currently with them during visit. The provider, Monia Pouch, FNP is located in their office at time of visit.  I discussed the limitations, risks, security and privacy concerns of performing an evaluation and management service by virtual visit and the availability of in person appointments. I also discussed with the patient that there may be a patient responsible charge related to this service. The patient expressed understanding and agreed to proceed.  Subjective:  Patient ID: Peter Howe, male    DOB: February 02, 1941, 81 y.o.   MRN: [id number]  Chief Complaint:  Cough and Nasal Congestion   HPI: Peter Howe is a 81 y.o. male presenting on 02/24/2022 for Cough and Nasal Congestion   Ongoing cough, congestion, rhinorrhea, and headache despite treatment with antihistamines and tessalon. Was seen at Urgent Care and was negative for COVID, flu, RSV, and strep. Has been taking medications as prescribed without improvement of symptoms.   Cough This is a recurrent problem. Episode onset: 2-3 weeks ago. The problem has been unchanged. The problem occurs every few minutes. The cough is Productive of sputum. Associated symptoms include nasal congestion, postnasal drip, rhinorrhea, a sore throat and wheezing. Pertinent negatives include no  chest pain, chills, ear congestion, ear pain, fever, headaches, heartburn, hemoptysis, myalgias, rash, shortness of breath, sweats or weight loss. He has tried prescription cough suppressant (antihistamine) for the symptoms. The treatment provided no relief.     Relevant past medical, surgical, family, and social history reviewed and updated as indicated.  Allergies and medications reviewed and updated.   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 Tawanna Solo TUNNEL RELEASE Left 2022   [place]  03/28/2003   St. 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  Tobacco Use   Smoking status: Former    Packs/day: 1.00    Years: 30.00    Total pack years: 30.00    Types: Cigarettes    Start date: 03/30/1955    Quit date: 03/09/1993    Years since quitting: 28.9    Passive exposure: Past   Smokeless tobacco: Never  Vaping Use   Vaping Use: Never used  Substance and Sexual Activity   Alcohol use: Yes     Alcohol/week: 7.0 standard drinks of alcohol    Types: 1 Glasses of wine, 4 Cans of beer, 2 Shots of liquor per week   Drug use: No   Sexual activity: Not Currently    Partners: Female  Other Topics Concern   Not on file  Social History Narrative   Owns a Engineer, technical sales, enjoys golf.   Lives in [place] lives 5 miles away   Social Determinants of Health   Financial Resource Strain: Low Risk  (12/08/2021)   Overall Financial Resource Strain (CARDIA)    Difficulty of Paying Living Expenses: Not hard at all  Food Insecurity: No Food Insecurity (12/08/2021)   Hunger Vital Sign    Worried About Running Out of Food in the Last Year: Never true    Ran Out of Food in the Last Year: Never true  Transportation Needs: No Transportation Needs (12/08/2021)   PRAPARE - Hydrologist (Medical): No    Lack of Transportation (Non-Medical): No  Physical Activity: Sufficiently Active (12/08/2021)   Exercise Vital Sign    Days of Exercise per Week: 5 days    Minutes of Exercise per Session: 60 min  Stress: No Stress Concern Present (12/08/2021)   [place]    Feeling of Stress : Not at all  Social Connections: Moderately Integrated (12/08/2021)   Social Connection and Isolation Panel [NHANES]    Frequency of Communication with Friends and Family: More than three times a week    Frequency of Social Gatherings with Friends and Family: More than three times a week    Attends Religious Services: More than 4 times per year    Active Member of [employer] or Organizations: Yes    Attends Archivist Meetings: More than 4 times per year    Marital Status: Widowed  Intimate Partner Violence: Not At Risk (12/08/2021)   Humiliation, Afraid, Rape, and Kick questionnaire    Fear of Current or Ex-Partner: No    Emotionally Abused: No    Physically Abused: No    Sexually Abused: No    Outpatient  Encounter Medications as of 02/24/2022  Medication Sig   doxycycline (VIBRA-TABS) 100 MG tablet Take 1 tablet (100 mg total) by mouth 2 (two) times daily for 10 days. 1 po bid   predniSONE (DELTASONE) 20 MG tablet 2 po at sametime daily for 5 days- start tomorrow   acetaminophen (TYLENOL) 325 MG tablet Take 650 mg by mouth every 6 (six) hours as needed for mild pain.   atenolol (TENORMIN) 50 MG tablet Take 1 tablet (50 mg total) by mouth daily.   benzonatate (TESSALON) 100 MG capsule Take 1 to 2 capsules every 8 hours as needed for cough.  Swallow whole.   colchicine 0.6 MG tablet Take 1.2 mg (2 tablets) by mouth at the first sign of a gout flare followed by 0.6 mg (1 tablet) one hour  later.   losartan (COZAAR) 50 MG tablet Take 1 tablet (50 mg total) by mouth daily.   Multiple Vitamin (MULTIVITAMIN WITH MINERALS) TABS tablet Take 1 tablet by mouth daily.   nitroGLYCERIN (NITROSTAT) 0.4 MG SL tablet Place 1 tablet under the tongue every 5 (five) minutes as needed for chest pain.   rivaroxaban (XARELTO) 20 MG TABS tablet Take 1 tablet (20 mg total) by mouth daily with supper.   rosuvastatin (CRESTOR) 5 MG tablet Take 1 tablet (5 mg total) by mouth once a week. (Patient not taking: Reported on 02/16/2022)   sertraline (ZOLOFT) 25 MG tablet Take 1 tablet (25 mg total) by mouth daily.   zolpidem (AMBIEN) 5 MG tablet Take 1 tablet (5 mg total) by mouth at bedtime.   No facility-administered encounter medications on file as of 02/24/2022.    Allergies  Allergen Reactions   Morphine And Related Other (See Comments)    Patient states it wired him up and didn't help. Prefers not to take again.   Penicillins Rash    Has patient had a PCN reaction causing immediate rash, facial/tongue/throat swelling, SOB or lightheadedness with hypotension: No Has patient had a PCN reaction causing severe rash involving mucus membranes or skin necrosis: No Has patient had a PCN reaction that required hospitalization  No Has patient had a PCN reaction occurring within the last 10 years: No If all of the above answers are "NO", then may proceed with Cephalosporin use.    Statins    Sulfonamide Derivatives Rash    Review of Systems  Constitutional:  Positive for activity change. Negative for appetite change, chills, diaphoresis, fatigue, fever, unexpected weight change and weight loss.  HENT:  Positive for congestion, postnasal drip, rhinorrhea and sore throat. Negative for dental problem, drooling, ear discharge, ear pain, facial swelling, hearing loss, mouth sores, sinus pressure, sinus pain, sneezing, tinnitus, trouble swallowing and voice change.   Respiratory:  Positive for cough and wheezing. Negative for apnea, hemoptysis, choking, chest tightness and shortness of breath.   Cardiovascular:  Negative for chest pain, palpitations and leg swelling.  Gastrointestinal:  Negative for abdominal pain, diarrhea, heartburn, nausea and vomiting.  Genitourinary:  Negative for decreased urine volume and difficulty urinating.  Musculoskeletal:  Negative for arthralgias and myalgias.  Skin:  Negative for rash.  Neurological:  Negative for dizziness, tremors, seizures, syncope, facial asymmetry, speech difficulty, weakness, light-headedness, numbness and headaches.  Psychiatric/Behavioral:  Negative for confusion.   All other systems reviewed and are negative.        Observations/Objective: No vital signs or physical exam, this was a virtual health encounter.  Pt alert and oriented, answers all questions appropriately, and able to speak in full sentences.    Assessment and Plan: Angel was seen today for cough and nasal congestion.  Diagnoses and all orders for this visit:  URI with cough and congestion Ongoing symptoms despite conservative management at home. Will add prednisone and doxycycline to regimen. Pt aware to report new, worsening, or persistent symptoms. Medications as prescribed.  -      predniSONE (DELTASONE) 20 MG tablet; 2 po at sametime daily for 5 days- start tomorrow -     doxycycline (VIBRA-TABS) 100 MG tablet; Take 1 tablet (100 mg total) by mouth 2 (two) times daily for 10 days. 1 po bid     Follow Up Instructions: Return if symptoms worsen or fail to improve.    I discussed the assessment and treatment plan with the patient. The patient  was provided an opportunity to ask questions and all were answered. The patient agreed with the plan and demonstrated an understanding of the instructions.   The patient was advised to call back or seek an in-person evaluation if the symptoms worsen or if the condition fails to improve as anticipated.  The above assessment and management plan was discussed with the patient. The patient verbalized understanding of and has agreed to the management plan. Patient is aware to call the clinic if they develop any new symptoms or if symptoms persist or worsen. Patient is aware when to return to the clinic for a follow-up visit. Patient educated on when it is appropriate to go to the emergency department.    I provided 13 minutes of time during this telephone encounter.   Monia Pouch, FNP-C [facility] [address] [place], [place] [address] [phone] 02/24/2022

## 2022-03-03 ENCOUNTER — Telehealth (INDEPENDENT_AMBULATORY_CARE_PROVIDER_SITE_OTHER): Payer: Medicare HMO | Admitting: Family

## 2022-03-03 ENCOUNTER — Encounter: Payer: Self-pay | Admitting: Family

## 2022-03-03 ENCOUNTER — Other Ambulatory Visit: Payer: Self-pay | Admitting: Family

## 2022-03-03 DIAGNOSIS — R109 Unspecified abdominal pain: Secondary | ICD-10-CM

## 2022-03-03 LAB — URINALYSIS, COMPLETE
Bilirubin, UA: NEGATIVE
Glucose, UA: NEGATIVE
Ketones, UA: NEGATIVE
Leukocytes,UA: NEGATIVE
Nitrite, UA: NEGATIVE
Specific Gravity, UA: <1.005 — ABNORMAL LOW (ref 1.005–1.030)
Urobilinogen, Ur: 0.2 mg/dL (ref 0.2–1.0)
pH, UA: 5.5 (ref 5.0–7.5)

## 2022-03-03 LAB — MICROSCOPIC EXAMINATION
Bacteria, UA: NONE SEEN
Epithelial Cells (non renal): NONE SEEN /hpf (ref 0–10)
RBC, Urine: NONE SEEN /hpf (ref 0–2)
Renal Epithel, UA: NONE SEEN /hpf
WBC, UA: NONE SEEN /hpf (ref 0–5)

## 2022-03-03 MED ORDER — HYDROCODONE-ACETAMINOPHEN 5-325 MG PO TABS: 1.0000 | ORAL_TABLET | Freq: Four times a day (QID) | ORAL | 0 refills | Status: DC | PRN

## 2022-03-03 MED ORDER — TAMSULOSIN HCL 0.4 MG PO CAPS: 0.4000 mg | ORAL_CAPSULE | Freq: Every day | ORAL | 0 refills | Status: DC

## 2022-03-03 NOTE — Progress Notes (Signed)
Virtual Visit Consent   Peter Howe, you are scheduled for a virtual visit with a [facility] provider today. Just as with appointments in the office, your consent must be obtained to participate. Your consent will be active for this visit and any virtual visit you may have with one of our providers in the next 365 days. If you have a MyChart account, a copy of this consent can be sent to you electronically.  As this is a virtual visit, video technology does not allow for your provider to perform a traditional examination. This may limit your provider's ability to fully assess your condition. If your provider identifies any concerns that need to be evaluated in person or the need to arrange testing (such as labs, EKG, etc.), we will make arrangements to do so. Although advances in technology are sophisticated, we cannot ensure that it will always work on either your end or our end. If the connection with a video visit is poor, the visit may have to be switched to a telephone visit. With either a video or telephone visit, we are not always able to ensure that we have a secure connection.  By engaging in this virtual visit, you consent to the provision of healthcare and authorize for your insurance to be billed (if applicable) for the services provided during this visit. Depending on your insurance coverage, you may receive a charge related to this service.  I need to obtain your verbal consent now. Are you willing to proceed with your visit today? JOSEP LUVIANO has provided verbal consent on 03/03/2022 for a virtual visit (video or telephone). Evelina Dun, FNP  Date: 03/03/2022 12:00 PM  Virtual Visit via Video Note   I, Evelina Dun, connected with  Peter Howe  ([id number], 15-Oct-1940) on 03/03/22 at  4:45 PM EST by a video-enabled telemedicine application and verified that I am speaking with the correct person using two identifiers.  Location: Patient: Virtual Visit Location  Patient: Home Provider: Virtual Visit Location Provider: Office/Clinic   I discussed the limitations of evaluation and management by telemedicine and the availability of in person appointments. The patient expressed understanding and agreed to proceed.    History of Present Illness: Peter Howe is a 81 y.o. who identifies as a male who was assigned male at birth, and is being seen today for possible kidney stone. He reports mild right flank pain that started three days ago. When the pain hits it is a 10 out 10. He has taken tylenol, forcing fluids with mild relief.   HPI: Flank Pain This is a new problem. The current episode started in the past 7 days. The problem occurs intermittently. The problem has been waxing and waning since onset. The pain is at a severity of 10/10. The pain is moderate.    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 Prefers not  to take again.   Penicillins Rash    Has patient had a PCN reaction causing immediate rash, facial/tongue/throat swelling, SOB or lightheadedness with hypotension: No Has patient had a PCN reaction causing severe rash involving mucus membranes or skin necrosis: No Has patient had a PCN reaction that required hospitalization No Has patient had a PCN reaction occurring within the last 10 years: No If all of  the above answers are "NO", then may proceed with Cephalosporin use.    Statins    Sulfonamide Derivatives Rash   Medications:  Current Outpatient Medications:    acetaminophen (TYLENOL) 325 MG tablet, Take 650 mg by mouth every 6 (six) hours as needed for mild pain., Disp: , Rfl:    atenolol (TENORMIN) 50 MG tablet, Take 1 tablet (50 mg total) by mouth daily., Disp: 90 tablet, Rfl: 3   colchicine 0.6 MG tablet, Take 1.2 mg (2 tablets) by mouth at the first sign of a gout flare followed by 0.6 mg (1 tablet) one hour later., Disp: 10 tablet, Rfl: 0   doxycycline (VIBRA-TABS) 100 MG tablet, Take 1 tablet (100 mg total) by mouth 2 (two) times daily for 10 days. 1 po bid, Disp: 20 tablet, Rfl: 0   losartan (COZAAR) 50 MG tablet, Take 1 tablet (50 mg total) by mouth daily., Disp: 90 tablet, Rfl: 1   Multiple Vitamin (MULTIVITAMIN WITH MINERALS) TABS tablet, Take 1 tablet by mouth daily., Disp: , Rfl:    nitroGLYCERIN (NITROSTAT) 0.4 MG SL tablet, Place 1 tablet under the tongue every 5 (five) minutes as needed for chest pain., Disp: 25 tablet, Rfl: 0   rivaroxaban (XARELTO) 20 MG TABS tablet, Take 1 tablet (20 mg total) by mouth daily with supper., Disp: 90 tablet, Rfl: 1   rosuvastatin (CRESTOR) 5 MG tablet, Take 1 tablet (5 mg total) by mouth once a week. (Patient not taking: Reported on 02/16/2022), Disp: 15 tablet, Rfl: 3   sertraline (ZOLOFT) 25 MG tablet, Take 1 tablet (25 mg total) by mouth daily., Disp: 90 tablet, Rfl: 1   zolpidem (AMBIEN) 5 MG tablet, Take 1 tablet (5 mg total) by mouth at bedtime., Disp: 30 tablet, Rfl: 5  Observations/Objective: Patient is well-developed, well-nourished in no acute distress.  Resting comfortably  at home.  Head is normocephalic, atraumatic.  No labored breathing.  Speech is clear and coherent with logical content.  Patient is alert and oriented at baseline.    Assessment and Plan: 1. Flank pain - Urinalysis, Complete  Pt will come in and leave  urine now Flomax ordered Force fluids CT ordered  Follow up if symptoms or do not improve   Follow Up Instructions: I discussed the assessment and treatment plan with the patient. The patient was provided an opportunity to ask questions and all were answered. The patient agreed with the plan and demonstrated an understanding of the instructions.  A copy of instructions were sent to the patient via MyChart unless otherwise noted below.     The patient was advised to call back or seek an in-person evaluation if the symptoms worsen or if the condition fails to improve as anticipated.  Time:  I spent 10 minutes with the patient via telehealth technology discussing the above problems/concerns.    Evelina Dun, FNP

## 2022-03-04 ENCOUNTER — Ambulatory Visit (HOSPITAL_COMMUNITY)
Admission: RE | Admit: 2022-03-04 | Discharge: 2022-03-04 | Disposition: A | Discharge status: Home / Self Care | Payer: Medicare HMO | Source: Ambulatory Visit | Attending: Family | Admitting: Family

## 2022-03-04 DIAGNOSIS — K4090 Unilateral inguinal hernia, without obstruction or gangrene, not specified as recurrent: Secondary | ICD-10-CM | POA: Diagnosis not present

## 2022-03-04 DIAGNOSIS — R109 Unspecified abdominal pain: Secondary | ICD-10-CM | POA: Diagnosis not present

## 2022-03-04 DIAGNOSIS — K8020 Calculus of gallbladder without cholecystitis without obstruction: Secondary | ICD-10-CM | POA: Diagnosis not present

## 2022-03-04 DIAGNOSIS — R3129 Other microscopic hematuria: Secondary | ICD-10-CM | POA: Diagnosis not present

## 2022-03-04 DIAGNOSIS — K5730 Diverticulosis of large intestine without perforation or abscess without bleeding: Secondary | ICD-10-CM | POA: Diagnosis not present

## 2022-03-04 NOTE — Progress Notes (Signed)
Remote pacemaker transmission.   

## 2022-03-05 ENCOUNTER — Telehealth: Payer: Self-pay | Admitting: Family Medicine

## 2022-03-05 NOTE — Telephone Encounter (Signed)
Please call patient with imaging results °

## 2022-03-06 NOTE — Telephone Encounter (Signed)
[facility] ordered but not in today-can you review and advise since you are PCP?

## 2022-03-06 NOTE — Telephone Encounter (Signed)
Pt wants to know CT Results

## 2022-03-06 NOTE — Telephone Encounter (Signed)
Patient aware and verbalizes understanding. 

## 2022-03-10 ENCOUNTER — Ambulatory Visit (INDEPENDENT_AMBULATORY_CARE_PROVIDER_SITE_OTHER): Payer: Medicare HMO | Admitting: Family Medicine

## 2022-03-10 ENCOUNTER — Encounter: Payer: Self-pay | Admitting: Family Medicine

## 2022-03-10 VITALS — BP 109/72 | HR 60 | Temp 98.2°F | Ht 64.0 in | Wt 175.4 lb

## 2022-03-10 DIAGNOSIS — M47816 Spondylosis without myelopathy or radiculopathy, lumbar region: Secondary | ICD-10-CM | POA: Diagnosis not present

## 2022-03-10 DIAGNOSIS — Z23 Encounter for immunization: Secondary | ICD-10-CM

## 2022-03-10 DIAGNOSIS — I700 Atherosclerosis of aorta: Secondary | ICD-10-CM

## 2022-03-10 NOTE — Progress Notes (Signed)
Subjective:  Patient ID: Peter Howe, male    DOB: 08/22/1940  Age: 82 y.o. MRN: [id number]  CC: Results   HPI Peter Howe presents for right flank pain. Better, but still present. CT done on 12/27. Here for review. Pain radiates from the mid lumbar region on the right around to the lower right abd. There are no GI side effects noted. The pain is a dull ache.        03/10/2022   11:39 AM 12/08/2021   10:58 AM 10/30/2021    9:13 AM  Depression screen PHQ 2/9  Decreased Interest 0 0 0  Down, Depressed, Hopeless 0 0 0  PHQ - 2 Score 0 0 0  Altered sleeping   0  Tired, decreased energy   0  Change in appetite   0  Feeling bad or failure about yourself    0  Trouble concentrating   0  Moving slowly or fidgety/restless   0  Suicidal thoughts   0  PHQ-9 Score   0  Difficult doing work/chores   Not difficult at all    History Peter Howe has a past medical history of Anxiety, Aortic atherosclerosis ([place]), Arthritis, Bilateral inguinal hernia (01/10/2021), Cancer ([place]), Diverticulosis (01/10/2021), GERD (gastroesophageal reflux disease), Hyperlipidemia, Hypertension, Overweight(278.02), Permanent atrial fibrillation ([place]), Seasonal allergies, and 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   He has a past surgical history that includes Pacemaker insertion (03/28/2003); PPM GENERATOR CHANGEOUT (N/A, 11/24/2018); Arm skin lesion biopsy / excision (Left); and Carpal tunnel release (Left, 2022).   His family history includes Alcohol abuse in his brother; Breast cancer in his sister, sister, sister, and sister; CAD (age of onset: 9) in his father; Cancer in his brother; Cancer (age of onset: 17) in his mother; Diabetes in his brother; Heart disease in his daughter; Stroke in his sister.He reports that he quit smoking about 29 years ago. His smoking use included cigarettes. He started smoking about 66 years ago. He has a 30.00 pack-year smoking history. He has been exposed to tobacco smoke. He has never  used smokeless tobacco. He reports current alcohol use of about 7.0 standard drinks of alcohol per week. He reports that he does not use drugs.    ROS Review of Systems  Constitutional:  Negative for fever.  Respiratory:  Negative for shortness of breath.   Cardiovascular:  Negative for chest pain.  Musculoskeletal:  Negative for arthralgias.  Skin:  Negative for rash.    Objective:  BP 109/72   Pulse 60   Temp 98.2 F (36.8 C)   Ht '5\' 4"'$  (1.626 m)   Wt 175 lb 6.4 oz (79.6 kg)   SpO2 97%   BMI 30.11 kg/m   BP Readings from Last 3 Encounters:  03/10/22 109/72  02/16/22 136/70  01/20/22 136/81    Wt Readings from Last 3 Encounters:  03/10/22 175 lb 6.4 oz (79.6 kg)  02/16/22 182 lb (82.6 kg)  01/20/22 180 lb (81.6 kg)     Physical Exam Vitals reviewed.  Constitutional:      Appearance: Normal appearance.  HENT:     Head: Normocephalic.     Nose: Nose normal.  Cardiovascular:     Rate and Rhythm: Normal rate and regular rhythm.     Heart sounds: No murmur heard. Pulmonary:     Breath sounds: Normal breath sounds.  Abdominal:     Palpations: Abdomen is soft. There is no mass.     Tenderness: There is no abdominal tenderness. There is  no guarding or rebound.  Musculoskeletal:     Cervical back: Normal range of motion.  Neurological:     Mental Status: He is alert.     CT showed contracted gallbladder, two inguinal hernias. Lumbar spondylosis. DDD. Diverticulosis.  No nephrolithiasis. No source for the pain.   Assessment & Plan:   Peter Howe was seen today for results.  Diagnoses and all orders for this visit:  Lumbar spondylosis  Need for influenza vaccination -     Flu Vaccine QUAD High Dose(Fluad)  Aortic atherosclerosis ([place])       I am having Peter Howe maintain his multivitamin with minerals, acetaminophen, nitroGLYCERIN, rosuvastatin, Xarelto, colchicine, losartan, sertraline, zolpidem, atenolol, tamsulosin, and  HYDROcodone-acetaminophen.  Allergies as of 03/10/2022       Reactions   Morphine And Related Other (See Comments)   Patient states it wired him up and didn't help. Prefers not to take again.   Penicillins Rash   Has patient had a PCN reaction causing immediate rash, facial/tongue/throat swelling, SOB or lightheadedness with hypotension: No Has patient had a PCN reaction causing severe rash involving mucus membranes or skin necrosis: No Has patient had a PCN reaction that required hospitalization No Has patient had a PCN reaction occurring within the last 10 years: No If all of the above answers are "NO", then may proceed with Cephalosporin use.   Statins    Sulfonamide Derivatives Rash        Medication List        Accurate as of March 10, 2022  8:57 PM. If you have any questions, ask your nurse or doctor.          acetaminophen 325 MG tablet Commonly known as: TYLENOL Take 650 mg by mouth every 6 (six) hours as needed for mild pain.   atenolol 50 MG tablet Commonly known as: TENORMIN Take 1 tablet (50 mg total) by mouth daily.   colchicine 0.6 MG tablet Take 1.2 mg (2 tablets) by mouth at the first sign of a gout flare followed by 0.6 mg (1 tablet) one hour later.   HYDROcodone-acetaminophen 5-325 MG tablet Commonly known as: Norco Take 1 tablet by mouth every 6 (six) hours as needed for moderate pain.   losartan 50 MG tablet Commonly known as: COZAAR Take 1 tablet (50 mg total) by mouth daily.   multivitamin with minerals Tabs tablet Take 1 tablet by mouth daily.   nitroGLYCERIN 0.4 MG SL tablet Commonly known as: NITROSTAT Place 1 tablet under the tongue every 5 (five) minutes as needed for chest pain.   rosuvastatin 5 MG tablet Commonly known as: CRESTOR Take 1 tablet (5 mg total) by mouth once a week.   sertraline 25 MG tablet Commonly known as: ZOLOFT Take 1 tablet (25 mg total) by mouth daily.   tamsulosin 0.4 MG Caps capsule Commonly known as:  FLOMAX Take 1 capsule (0.4 mg total) by mouth daily.   Xarelto 20 MG Tabs tablet Generic drug: rivaroxaban Take 1 tablet (20 mg total) by mouth daily with supper.   zolpidem 5 MG tablet Commonly known as: AMBIEN Take 1 tablet (5 mg total) by mouth at bedtime.       Pt. Was evaluated for the hernias by a surgeon about a year ago. He declined to operate due to surgical risk. Pt. Does not want to pursue this or the gallbladder dx at this time.  Follow-up: Return if symptoms worsen or fail to improve.  Claretta Fraise, M.D.

## 2022-03-25 ENCOUNTER — Other Ambulatory Visit (HOSPITAL_BASED_OUTPATIENT_CLINIC_OR_DEPARTMENT_OTHER): Payer: Self-pay | Admitting: Internal Medicine

## 2022-03-25 DIAGNOSIS — I4821 Permanent atrial fibrillation: Secondary | ICD-10-CM

## 2022-03-26 NOTE — Telephone Encounter (Signed)
Xarelto '20mg'$  refill request received. Pt is 82 years old, weight-79.6kg, Crea-1.29 on 10/30/2021, last seen by Dr. Quentin Ore on 02/16/2022, Diagnosis-Afib, CrCl-50.56 mL/min; Dose is appropriate based on dosing criteria. Will send in refill to requested pharmacy.

## 2022-04-15 ENCOUNTER — Encounter: Payer: Self-pay | Admitting: Family Medicine

## 2022-04-15 ENCOUNTER — Ambulatory Visit (INDEPENDENT_AMBULATORY_CARE_PROVIDER_SITE_OTHER): Payer: Medicare HMO | Admitting: Family Medicine

## 2022-04-15 VITALS — BP 123/64 | HR 67 | Temp 97.6°F | Ht 64.0 in | Wt 177.8 lb

## 2022-04-15 DIAGNOSIS — M25511 Pain in right shoulder: Secondary | ICD-10-CM

## 2022-04-15 MED ORDER — TIZANIDINE HCL 4 MG PO TABS: 4.0000 mg | ORAL_TABLET | Freq: Four times a day (QID) | ORAL | 1 refills | Status: DC | PRN

## 2022-04-15 MED ORDER — BETAMETHASONE SOD PHOS & ACET 6 (3-3) MG/ML IJ SUSP
6.0000 mg | Freq: Once | INTRAMUSCULAR | Status: AC
  Administered 2022-04-15: 6 mg via INTRAMUSCULAR

## 2022-04-15 NOTE — Progress Notes (Signed)
Chief Complaint  Patient presents with   Neck Pain    Radiculopathy right side   Fatigue    HPI  Patient presents today for  two weeks of Right shoulder pain. NKI. Starts at the base of the neck and radiates to the deltoid insertion. Feels like tis infected, he states.. Painful for abduction, but worst when he sleeps on the right side.   PMH: Smoking status noted ROS: Per HPI  Objective: BP 123/64   Pulse 67   Temp 97.6 F (36.4 C)   Ht '5\' 4"'$  (1.626 m)   Wt 177 lb 12.8 oz (80.6 kg)   SpO2 97%   BMI 30.52 kg/m  Gen: NAD, alert, cooperative with exam HEENT: NCAT, EOMI, PERRL CV: RRR, good S1/S2, no murmur Ext: No edema, warm. At right shoulder there is decreased abduction past 90 degrees. Tender at superior trap and lateral deltoid. Rotation is nontender Neuro: Alert and oriented, No gross deficits  Assessment and plan:  1. Acute pain of right shoulder     Meds ordered this encounter  Medications   betamethasone acetate-betamethasone sodium phosphate (CELESTONE) injection 6 mg   tiZANidine (ZANAFLEX) 4 MG tablet    Sig: Take 1 tablet (4 mg total) by mouth every 6 (six) hours as needed for muscle spasms.    Dispense:  30 tablet    Refill:  1    Orders Placed This Encounter  Procedures   Ambulatory referral to Physical Therapy    Referral Priority:   Routine    Referral Type:   Physical Medicine    Referral Reason:   Specialty Services Required    Requested Specialty:   Physical Therapy    Number of Visits Requested:   1    Follow up as needed.  Claretta Fraise, MD

## 2022-04-23 ENCOUNTER — Other Ambulatory Visit: Payer: Self-pay

## 2022-04-23 ENCOUNTER — Ambulatory Visit: Payer: Medicare HMO | Attending: Family Medicine

## 2022-04-23 DIAGNOSIS — M6281 Muscle weakness (generalized): Secondary | ICD-10-CM | POA: Diagnosis not present

## 2022-04-23 DIAGNOSIS — M25611 Stiffness of right shoulder, not elsewhere classified: Secondary | ICD-10-CM | POA: Insufficient documentation

## 2022-04-23 DIAGNOSIS — G8929 Other chronic pain: Secondary | ICD-10-CM | POA: Insufficient documentation

## 2022-04-23 DIAGNOSIS — M25511 Pain in right shoulder: Secondary | ICD-10-CM | POA: Diagnosis not present

## 2022-04-23 NOTE — Therapy (Signed)
OUTPATIENT PHYSICAL THERAPY SHOULDER EVALUATION   Patient Name: Peter Howe MRN: [id number] DOB:22-Feb-1941, 82 y.o., male Today's Date: 04/23/2022  END OF SESSION:  PT End of Session - 04/23/22 1811     Visit Number 1    Number of Visits 8    Date for PT Re-Evaluation 07/03/22    PT Start Time 1518    PT Stop Time 1550    PT Time Calculation (min) 32 min    Activity Tolerance Patient tolerated treatment well    Behavior During Therapy [facility] for tasks assessed/performed             Past Medical History:  Diagnosis Date   Anxiety    on meds   Aortic atherosclerosis ([place])    Arthritis    generalized   Bilateral inguinal hernia 01/10/2021   Cancer ([place])    skin cancers removed   Diverticulosis 01/10/2021   GERD (gastroesophageal reflux disease)    on meds   Hyperlipidemia    on meds   Hypertension    on meds   Overweight(278.02)    obesity   Permanent atrial fibrillation ([facility])    Seasonal allergies    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 Tawanna Solo TUNNEL RELEASE Left 2022   [facility]  03/28/2003   St. Jude-PPM   PPM GENERATOR CHANGEOUT N/A 11/24/2018   Procedure: PPM GENERATOR CHANGEOUT;  Surgeon: [name], MD;  Location: [place] [facility];  Service: Cardiovascular;  Laterality: N/A;   Patient Active Problem List   Diagnosis Date Noted   Lumbar spondylosis 03/10/2022   Bilateral inguinal hernia 01/10/2021   Aortic atherosclerosis ([place]) 06/24/2020   Carpal tunnel syndrome, left upper limb 05/15/2020   Primary insomnia 05/19/2019   Controlled substance agreement signed 05/19/2019   Elevated liver enzymes 06/08/2017   Pacemaker 06/04/2017   Gastroesophageal reflux disease 02/05/2016   Essential hypertension with goal blood pressure less than 130/80 09/18/2013   ATRIAL FIBRILLATION 01/02/2010   SICK SINUS/  TACHY-BRADY SYNDROME 12/05/2008   REFERRING PROVIDER: Claretta Fraise, MD   REFERRING DIAG: Acute pain of right shoulder   THERAPY DIAG:  Chronic right shoulder pain  Stiffness of right shoulder, not elsewhere classified  Muscle weakness (generalized)  Rationale for Evaluation and Treatment: Rehabilitation  ONSET DATE: 3 months ago  SUBJECTIVE:  SUBJECTIVE STATEMENT: Patient reports that he was playing golf about 3 months ago when he noticed that his right shoulder started bothering him. He notes that his left shoulder started bothering him as well, but not as bad as the right arm. He had never had any problems with his shoulders or arms prior to this pain. He is right hand dominant. He has noticed that his shoulders will bother him more in the morning, but it will ease up throughout the day. The prednisone that he was given helped some, but it is starting to come back.   PERTINENT HISTORY: Atrial fibrillation, HTN, low back pain, anxiety, OA, history of cancer, and presence of a pacemaker  PAIN:  Are you having pain? Yes: NPRS scale: 5/10 Pain location: bilateral shoulders down to mid humerus Pain description: sharp Aggravating factors: moving his arms Relieving factors: rest  PRECAUTIONS: ICD/Pacemaker  WEIGHT BEARING RESTRICTIONS: No  FALLS:  Has patient fallen in last 6 months? No  LIVING ENVIRONMENT: Lives with: lives alone Lives in: House/apartment  OCCUPATION: Retired   PLOF: Independent  PATIENT GOALS:reduced pain, improved strength, and return to playing golf  NEXT MD VISIT: 04/30/21  OBJECTIVE:   PATIENT SURVEYS:  FOTO to be completed at first follow up   COGNITION: Overall cognitive status: Within functional limits for tasks assessed     SENSATION: Patient reports no  numbness or tingling  POSTURE: Forward head, rounded shoulders, and increased thoracic kyphosis  UPPER EXTREMITY ROM:   Active ROM Right eval Left eval  Shoulder flexion 111; painful  124; painful   Shoulder extension    Shoulder abduction 105; painful 108; painful   Shoulder adduction    Shoulder internal rotation To L5; painful To L3; painful   Shoulder external rotation To T2; "tight and pain" To T3; painful   Elbow flexion    Elbow extension    Wrist flexion    Wrist extension    Wrist ulnar deviation    Wrist radial deviation    Wrist pronation    Wrist supination    (Blank rows = not tested)  UPPER EXTREMITY MMT:  MMT Right eval Left eval  Shoulder flexion 4/5; painful 4/5; painful   Shoulder extension    Shoulder abduction 3+/5; painful 3+/5; painful   Shoulder adduction    Shoulder internal rotation 4-/5; painful 4/5; painful  Shoulder external rotation 4-/5; painful 4/5; painful   Middle trapezius    Lower trapezius    Elbow flexion    Elbow extension    Wrist flexion    Wrist extension    Wrist ulnar deviation    Wrist radial deviation    Wrist pronation    Wrist supination    Grip strength (lbs)    (Blank rows = not tested)  JOINT MOBILITY TESTING:  Glenohumeral: hypomobile and painful bilaterally  PALPATION:  TTP: bilateral latissimus dorsi, left infraspinatus, right upper trapezius, and supraspinatus   TODAY'S TREATMENT:  DATE:                                    04/23/22 EXERCISE LOG  Exercise Repetitions and Resistance Comments  Scapular retraction 20 reps    Isometric shoulder flexion  20 reps w/ 5 second hold    Isometric shoulder ABD 20 reps w/ 5 second hold            Blank cell = exercise not performed today   PATIENT EDUCATION: Education details: HEP, healing, prognosis, POC, and goals for  therapy Person educated: Patient Education method: Explanation Education comprehension: verbalized understanding  HOME EXERCISE PROGRAM: Today's interventions were provided as his HEP. He was recommended to complete these interventions twice per day.   ASSESSMENT:  CLINICAL IMPRESSION: Patient is a 82 y.o. male who was seen today for physical therapy evaluation and treatment for right shoulder pain. He presented with moderate pain severity and high irritability with bilateral shoulder AROM and manual muscle testing reproducing his familiar pain. He exhibited reduced shoulder active range of motion with his right shoulder mobility being less than his left. He was provided a HEP which he was able to properly demonstrate. Recommend that he continues to require skilled physical therapy to address his impairments to return to his prior level of function.    OBJECTIVE IMPAIRMENTS: decreased ROM, decreased strength, hypomobility, impaired tone, postural dysfunction, and pain.   ACTIVITY LIMITATIONS: lifting and reach over head  PARTICIPATION LIMITATIONS: cleaning, community activity, and yard work  PERSONAL FACTORS: Age, Time since onset of injury/illness/exacerbation, and 3+ comorbidities: Atrial fibrillation, HTN, low back pain, anxiety, OA, history of cancer, and presence of a pacemaker  are also affecting patient's functional outcome.   REHAB POTENTIAL: Good  CLINICAL DECISION MAKING: Evolving/moderate complexity  EVALUATION COMPLEXITY: Moderate   GOALS: Goals reviewed with patient? Yes  LONG TERM GOALS: Target date: 05/21/22  Patient will be independent with his HEP.  Baseline:  Goal status: INITIAL  2.  Patient will be able return to golf without his familiar pain exceeding 3/10. Baseline:  Goal status: INITIAL  3.  Patient will be able to demonstrate at least 125 degrees of active right shoulder flexion for improved function with overhead activities. Baseline:  Goal status:  INITIAL  4.  Patient will be able to demonstrate at least 4/5 right shoulder strength in all planes without being limited by his familiar pain.  Baseline:  Goal status: INITIAL  PLAN:  PT FREQUENCY: 2x/week  PT DURATION: 4 weeks  PLANNED INTERVENTIONS: Therapeutic exercises, Therapeutic activity, Neuromuscular re-education, Patient/Family education, Self Care, Joint mobilization, Cryotherapy, Moist heat, Vasopneumatic device, Manual therapy, and Re-evaluation  PLAN FOR NEXT SESSION: pulleys, isometrics, rotator cuff strengthening, and modalities as needed   Darlin Coco, PT 04/23/2022, 6:31 PM

## 2022-04-27 ENCOUNTER — Encounter: Payer: Self-pay | Admitting: Physical Therapy

## 2022-04-27 ENCOUNTER — Ambulatory Visit: Payer: Medicare HMO | Admitting: Physical Therapy

## 2022-04-27 DIAGNOSIS — M25511 Pain in right shoulder: Secondary | ICD-10-CM | POA: Diagnosis not present

## 2022-04-27 DIAGNOSIS — G8929 Other chronic pain: Secondary | ICD-10-CM

## 2022-04-27 DIAGNOSIS — M6281 Muscle weakness (generalized): Secondary | ICD-10-CM

## 2022-04-27 DIAGNOSIS — M25611 Stiffness of right shoulder, not elsewhere classified: Secondary | ICD-10-CM | POA: Diagnosis not present

## 2022-04-27 NOTE — Therapy (Addendum)
OUTPATIENT PHYSICAL THERAPY SHOULDER TREATMENT   Patient Name: Peter Howe MRN: [id number] DOB:25-May-1940, 82 y.o., male Today's Date: 04/27/2022  END OF SESSION:  PT End of Session - 04/27/22 0816     Visit Number 2    Number of Visits 8    Date for PT Re-Evaluation 07/03/22    PT Start Time 0818    PT Stop Time 0900    PT Time Calculation (min) 42 min    Activity Tolerance Patient tolerated treatment well    Behavior During Therapy [facility] for tasks assessed/performed            Past Medical History:  Diagnosis Date   Anxiety    on meds   Aortic atherosclerosis ([place])    Arthritis    generalized   Bilateral inguinal hernia 01/10/2021   Cancer ([place])    skin cancers removed   Diverticulosis 01/10/2021   GERD (gastroesophageal reflux disease)    on meds   Hyperlipidemia    on meds   Hypertension    on meds   Overweight(278.02)    obesity   Permanent atrial fibrillation ([facility])    Seasonal allergies    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 Tawanna Solo TUNNEL RELEASE Left 2022   [place]  03/28/2003   St. Jude-PPM   PPM GENERATOR CHANGEOUT N/A 11/24/2018   Procedure: PPM GENERATOR CHANGEOUT;  Surgeon: [name], MD;  Location: [place] [facility];  Service: Cardiovascular;  Laterality: N/A;   Patient Active Problem List   Diagnosis Date Noted   Lumbar spondylosis 03/10/2022   Bilateral inguinal hernia 01/10/2021   Aortic atherosclerosis ([place]) 06/24/2020   Carpal tunnel syndrome, left upper limb 05/15/2020   Primary insomnia 05/19/2019   Controlled substance agreement signed 05/19/2019   Elevated liver enzymes 06/08/2017   Pacemaker 06/04/2017   Gastroesophageal reflux disease 02/05/2016   Essential hypertension with goal blood pressure less than 130/80 09/18/2013   ATRIAL FIBRILLATION 01/02/2010   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  PERTINENT HISTORY: Atrial fibrillation, HTN, low back pain, anxiety, OA, history of cancer, and presence of a pacemaker  PAIN:  Are you having pain? Yes: NPRS scale: 6/10 Pain location: bilateral shoulders down to mid humerus Pain description: sharp Aggravating factors: moving his arms Relieving factors: rest  PRECAUTIONS: ICD/Pacemaker  PATIENT GOALS:reduced pain, improved strength, and return to playing golf  NEXT MD VISIT: 04/30/21  OBJECTIVE:   PATIENT SURVEYS:  FOTO 48% limitation  POSTURE: Forward head, rounded shoulders, and increased thoracic kyphosis  UPPER EXTREMITY ROM:   Active ROM Right eval Left eval  Shoulder flexion 111; painful  124; painful   Shoulder extension    Shoulder abduction 105; painful 108; painful   Shoulder adduction    Shoulder internal rotation To L5; painful To L3; painful   Shoulder external rotation To T2; "tight and pain" To T3; painful   Elbow flexion    Elbow extension    Wrist flexion    Wrist extension    Wrist ulnar deviation    Wrist radial deviation    Wrist pronation    Wrist supination    (Blank rows = not tested)  UPPER EXTREMITY MMT:  MMT Right eval Left eval  Shoulder flexion 4/5; painful 4/5; painful   Shoulder extension    Shoulder abduction 3+/5; painful 3+/5; painful   Shoulder adduction    Shoulder internal rotation 4-/5;  painful 4/5; painful  Shoulder external rotation 4-/5; painful 4/5; painful   Middle trapezius    Lower trapezius    Elbow flexion    Elbow extension    Wrist flexion    Wrist extension    Wrist ulnar deviation    Wrist radial deviation    Wrist pronation    Wrist supination    Grip strength (lbs)    (Blank rows = not tested)  JOINT MOBILITY TESTING:  Glenohumeral: hypomobile and painful bilaterally  PALPATION:  TTP: bilateral latissimus dorsi, left infraspinatus, right upper trapezius, and supraspinatus   TODAY'S TREATMENT:                                                                                                                                         DATE:                                 04/27/22 EXERCISE LOG  Exercise Repetitions and Resistance Comments  Pulleys X5 min   B shoulder wall slides X20 reps   R shoulder extension Yellow x15 reps   R shoulder row Yellow x20 reps   R shoulder IR Yellow x20 reps   R shoulder isometric ER 5 reps 5 sec holds   R shoulder isometric abd 5 reps 5 sec holds    Blank cell = exercise not performed today  Manual Therapy Myofascial Release: R bicep/deltoids, IASTW to reduce tone of deltoids    Modalities  Date: 04/27/2022 Unattended Estim: Shoulder, Pre-Mod, 15 mins, Pain and Tone Hot Pack: Shoulder, 15 mins, Pain and Tone  PATIENT EDUCATION: Education details: HEP, healing, prognosis, POC, and goals for therapy Person educated: Patient Education method: Explanation Education comprehension: verbalized understanding  HOME EXERCISE PROGRAM: Today's interventions were provided as his HEP. He was recommended to complete these interventions twice per day.   ASSESSMENT:  CLINICAL IMPRESSION:   Patient presented in clinic with reports of moderate shoulder pain with certain movements. Patient guided through light strengthening exercises with intermittant reports of shooting pain from acromian region to R deltoids. Patient reports  that B shoulder are painful but R > L. Increased tone palpable in R deltoids. Good redness response to R deltoids, bicep noted with IASTW session. Patient cautioned that by starting therex and IASTW that soreness may present. Normal modalities response noted following removal of the modalities.  OBJECTIVE IMPAIRMENTS: decreased ROM, decreased strength, hypomobility, impaired tone, postural dysfunction, and pain.   ACTIVITY LIMITATIONS: lifting and reach over head  PARTICIPATION LIMITATIONS: cleaning, community activity, and yard work  PERSONAL FACTORS: Age, Time since onset of injury/illness/exacerbation, and 3+ comorbidities: Atrial fibrillation, HTN, low back pain, anxiety, OA, history of cancer, and presence of a pacemaker  are also affecting patient's functional outcome.   REHAB POTENTIAL: Good  CLINICAL DECISION MAKING: Evolving/moderate complexity  EVALUATION COMPLEXITY: Moderate  GOALS: Goals reviewed with patient? Yes  LONG TERM GOALS: Target date: 05/21/22  Patient will be independent with his HEP.  Baseline:  Goal status: INITIAL  2.  Patient will be able return to golf without his familiar pain exceeding 3/10. Baseline:  Goal status: INITIAL  3.  Patient will be able to demonstrate at least 125 degrees of active right shoulder flexion for improved function with overhead activities. Baseline:  Goal status: INITIAL  4.  Patient will be able to demonstrate at least 4/5 right shoulder strength in all planes without being limited by his familiar pain.  Baseline:  Goal status: INITIAL  PLAN:  PT FREQUENCY: 2x/week  PT DURATION: 4 weeks  PLANNED INTERVENTIONS: Therapeutic exercises, Therapeutic activity, Neuromuscular re-education, Patient/Family education, Self Care, Joint mobilization, Cryotherapy, Moist heat, Vasopneumatic device, Manual therapy, and Re-evaluation  PLAN FOR NEXT SESSION: pulleys, isometrics, rotator cuff strengthening, and modalities as  needed  Standley Brooking, PTA 04/27/2022, 9:11 AM

## 2022-04-29 ENCOUNTER — Encounter: Payer: Self-pay | Admitting: Physical Therapy

## 2022-04-29 ENCOUNTER — Ambulatory Visit: Payer: Medicare HMO | Admitting: Physical Therapy

## 2022-04-29 DIAGNOSIS — M25511 Pain in right shoulder: Secondary | ICD-10-CM | POA: Diagnosis not present

## 2022-04-29 DIAGNOSIS — G8929 Other chronic pain: Secondary | ICD-10-CM

## 2022-04-29 DIAGNOSIS — M6281 Muscle weakness (generalized): Secondary | ICD-10-CM | POA: Diagnosis not present

## 2022-04-29 DIAGNOSIS — M25611 Stiffness of right shoulder, not elsewhere classified: Secondary | ICD-10-CM

## 2022-04-29 NOTE — Therapy (Signed)
OUTPATIENT PHYSICAL THERAPY SHOULDER TREATMENT   Patient Name: Peter Howe MRN: [id number] DOB:Jan 01, 1941, 82 y.o., male Today's Date: 04/29/2022  END OF SESSION:  PT End of Session - 04/29/22 0817     Visit Number 3    Number of Visits 8    Date for PT Re-Evaluation 07/03/22    PT Start Time 0817    PT Stop Time 0908    PT Time Calculation (min) 51 min    Activity Tolerance Patient tolerated treatment well    Behavior During Therapy [facility] for tasks assessed/performed            Past Medical History:  Diagnosis Date   Anxiety    on meds   Aortic atherosclerosis ([place])    Arthritis    generalized   Bilateral inguinal hernia 01/10/2021   Cancer ([place])    skin cancers removed   Diverticulosis 01/10/2021   GERD (gastroesophageal reflux disease)    on meds   Hyperlipidemia    on meds   Hypertension    on meds   Overweight(278.02)    obesity   Permanent atrial fibrillation ([facility])    Seasonal allergies    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 Tawanna Solo TUNNEL RELEASE Left 2022   [employer]  03/28/2003   [employer]-PPM   PPM GENERATOR CHANGEOUT N/A 11/24/2018   Procedure: PPM GENERATOR CHANGEOUT;  Surgeon: [name], MD;  Location: [place] [facility];  Service: Cardiovascular;  Laterality: N/A;   Patient Active Problem List   Diagnosis Date Noted   Lumbar spondylosis 03/10/2022   Bilateral inguinal hernia 01/10/2021   Aortic atherosclerosis ([place]) 06/24/2020   Carpal tunnel syndrome, left upper limb 05/15/2020   Primary insomnia 05/19/2019   Controlled substance agreement signed 05/19/2019   Elevated liver enzymes 06/08/2017   Pacemaker 06/04/2017   Gastroesophageal reflux disease 02/05/2016   Essential hypertension with goal blood pressure less than 130/80 09/18/2013   ATRIAL FIBRILLATION 01/02/2010   SICK SINUS/  TACHY-BRADY SYNDROME 12/05/2008   REFERRING PROVIDER: Claretta Fraise, MD   REFERRING DIAG: Acute pain of right shoulder   THERAPY DIAG:  Chronic right shoulder pain  Stiffness of right shoulder, not elsewhere classified  Muscle weakness (generalized)  Rationale for Evaluation and Treatment: Rehabilitation  ONSET DATE: 3 months ago  SUBJECTIVE:  SUBJECTIVE STATEMENT: Pain is at certain movements still but feels like stim and heat helped last time.  PERTINENT HISTORY: Atrial fibrillation, HTN, low back pain, anxiety, OA, history of cancer, and presence of a pacemaker  PAIN:  Are you having pain? Yes: NPRS scale: 3/10 Pain location: bilateral shoulders down to mid humerus Pain description: sharp Aggravating factors: moving his arms Relieving factors: rest  PRECAUTIONS: ICD/Pacemaker  PATIENT GOALS:reduced pain, improved strength, and return to playing golf  NEXT MD VISIT: 04/30/21  OBJECTIVE:   PATIENT SURVEYS:  FOTO 48% limitation  POSTURE: Forward head, rounded shoulders, and increased thoracic kyphosis  UPPER EXTREMITY ROM:   Active ROM Right eval Left eval  Shoulder flexion 111; painful  124; painful   Shoulder extension    Shoulder abduction 105; painful 108; painful   Shoulder adduction    Shoulder internal rotation To L5; painful To L3; painful   Shoulder external rotation To T2; "tight and pain" To T3; painful   Elbow flexion    Elbow extension    Wrist flexion    Wrist extension    Wrist ulnar deviation    Wrist radial deviation    Wrist pronation    Wrist supination    (Blank rows = not tested)  UPPER EXTREMITY MMT:  MMT Right eval Left eval  Shoulder flexion 4/5; painful 4/5; painful   Shoulder extension    Shoulder abduction 3+/5; painful 3+/5; painful    Shoulder adduction    Shoulder internal rotation 4-/5; painful 4/5; painful  Shoulder external rotation 4-/5; painful 4/5; painful   Middle trapezius    Lower trapezius    Elbow flexion    Elbow extension    Wrist flexion    Wrist extension    Wrist ulnar deviation    Wrist radial deviation    Wrist pronation    Wrist supination    Grip strength (lbs)    (Blank rows = not tested)  JOINT MOBILITY TESTING:  Glenohumeral: hypomobile and painful bilaterally  PALPATION:  TTP: bilateral latissimus dorsi, left infraspinatus, right upper trapezius, and supraspinatus   TODAY'S TREATMENT:                                                                                                                                         DATE:                            2/21  EXERCISE LOG  Exercise Repetitions and Resistance Comments  UBE 120 RPM x6 min   Pulley X5 min   Wall slides Flexion x20 reps   R shoulder isometric ER X10 reps 5 sec holds   R shoulder isometric abd X10 reps 5 sec holds Reported pain from R shoulder to elbow  R shoulder isometric flex X10 reps 5 sec holds    R shoulder row Yellow  x20 reps   R shoulder extension Yellow x20 reps   R shoulder IR Yellow x20 reps    Blank cell = exercise not performed today   Manual Therapy Myofascial Release: R deltoids, IASTW to reduce pain    Modalities  Date: 2/21 Unattended Estim: Shoulder, Pre-Mod, 10 mins, Pain Hot Pack: Shoulder, 10 mins, Pain                 04/27/22 EXERCISE LOG  Exercise Repetitions and Resistance Comments  Pulleys X5 min   B shoulder wall slides X20 reps   R shoulder extension Yellow x15 reps   R shoulder row Yellow x20 reps   R shoulder IR Yellow x20 reps   R shoulder isometric ER 5 reps 5 sec holds   R shoulder isometric abd 5 reps 5 sec holds    Blank cell = exercise not performed today   Manual Therapy Myofascial Release: R bicep/deltoids, IASTW to reduce tone of deltoids    Modalities  Date:  04/27/2022 Unattended Estim: Shoulder, Pre-Mod, 15 mins, Pain and Tone Hot Pack: Shoulder, 15 mins, Pain and Tone  PATIENT EDUCATION: Education details: HEP, healing, prognosis, POC, and goals for therapy Person educated: Patient Education method: Explanation Education comprehension: verbalized understanding  HOME EXERCISE PROGRAM: Today's interventions were provided as his HEP. He was recommended to complete these interventions twice per day.   ASSESSMENT:  CLINICAL IMPRESSION: Patient presented in clinic with reports of mild R shoulder pain. Patient did report some pain with isometric abduction that he rated 8/10. Patient reported less pain when isometric R shoulder flexion was attempted today. Mild redness response to R deltoids with IASTW session. Patient reports that his greatest limitations at home being liftng or reaching [facility]. Normal modalities response noted following removal of the modalities.  OBJECTIVE IMPAIRMENTS: decreased ROM, decreased strength, hypomobility, impaired tone, postural dysfunction, and pain.   ACTIVITY LIMITATIONS: lifting and reach over head  PARTICIPATION LIMITATIONS: cleaning, community activity, and yard work  PERSONAL FACTORS: Age, Time since onset of injury/illness/exacerbation, and 3+ comorbidities: Atrial fibrillation, HTN, low back pain, anxiety, OA, history of cancer, and presence of a pacemaker  are also affecting patient's functional outcome.   REHAB POTENTIAL: Good  CLINICAL DECISION MAKING: Evolving/moderate complexity  EVALUATION COMPLEXITY: Moderate  GOALS: Goals reviewed with patient? Yes  LONG TERM GOALS: Target date: 05/21/22  Patient will be independent with his HEP.  Baseline:  Goal status: INITIAL  2.  Patient will be able return to golf without his familiar pain exceeding 3/10. Baseline:  Goal status: INITIAL  3.  Patient will be able to demonstrate at least 125 degrees of active right shoulder flexion for improved function  with overhead activities. Baseline:  Goal status: INITIAL  4.  Patient will be able to demonstrate at least 4/5 right shoulder strength in all planes without being limited by his familiar pain.  Baseline:  Goal status: INITIAL  PLAN:  PT FREQUENCY: 2x/week  PT DURATION: 4 weeks  PLANNED INTERVENTIONS: Therapeutic exercises, Therapeutic activity, Neuromuscular re-education, Patient/Family education, Self Care, Joint mobilization, Cryotherapy, Moist heat, Vasopneumatic device, Manual therapy, and Re-evaluation  PLAN FOR NEXT SESSION: pulleys, isometrics, rotator cuff strengthening, and modalities as needed  Standley Brooking, PTA 04/29/2022, 9:16 AM

## 2022-04-30 ENCOUNTER — Ambulatory Visit (INDEPENDENT_AMBULATORY_CARE_PROVIDER_SITE_OTHER): Payer: Medicare HMO | Admitting: Family Medicine

## 2022-04-30 ENCOUNTER — Encounter: Payer: Self-pay | Admitting: Family Medicine

## 2022-04-30 ENCOUNTER — Ambulatory Visit: Payer: Medicare HMO | Admitting: Family Medicine

## 2022-04-30 DIAGNOSIS — F410 Panic disorder [episodic paroxysmal anxiety] without agoraphobia: Secondary | ICD-10-CM

## 2022-04-30 DIAGNOSIS — I10 Essential (primary) hypertension: Secondary | ICD-10-CM

## 2022-04-30 DIAGNOSIS — I4821 Permanent atrial fibrillation: Secondary | ICD-10-CM | POA: Diagnosis not present

## 2022-04-30 DIAGNOSIS — I739 Peripheral vascular disease, unspecified: Secondary | ICD-10-CM | POA: Insufficient documentation

## 2022-04-30 DIAGNOSIS — R69 Illness, unspecified: Secondary | ICD-10-CM | POA: Diagnosis not present

## 2022-04-30 DIAGNOSIS — F5101 Primary insomnia: Secondary | ICD-10-CM | POA: Diagnosis not present

## 2022-04-30 DIAGNOSIS — Z79899 Other long term (current) drug therapy: Secondary | ICD-10-CM

## 2022-04-30 DIAGNOSIS — I700 Atherosclerosis of aorta: Secondary | ICD-10-CM

## 2022-04-30 MED ORDER — LOSARTAN POTASSIUM 50 MG PO TABS: 50.0000 mg | ORAL_TABLET | Freq: Every day | ORAL | 1 refills | Status: DC

## 2022-04-30 MED ORDER — SERTRALINE HCL 25 MG PO TABS: 25.0000 mg | ORAL_TABLET | Freq: Every day | ORAL | 1 refills | Status: DC

## 2022-04-30 MED ORDER — ZOLPIDEM TARTRATE 5 MG PO TABS: 5.0000 mg | ORAL_TABLET | Freq: Every day | ORAL | 1 refills | Status: DC

## 2022-04-30 MED ORDER — ROSUVASTATIN CALCIUM 5 MG PO TABS: 5.0000 mg | ORAL_TABLET | ORAL | 3 refills | Status: DC

## 2022-04-30 NOTE — Progress Notes (Signed)
Subjective:  Patient ID: Peter Howe, male    DOB: [birth year], 82 y.o.   MRN: [id number]  Patient Care Team: Baruch Gouty, FNP as PCP - General (Family Medicine) Vickie Epley, MD as PCP - Electrophysiology (Cardiology) Thornton Park, MD as Consulting Physician (Gastroenterology) Aviva Signs, MD as Consulting Physician (General Surgery) Garald Balding, MD (Inactive) as Consulting Physician (Orthopedic Surgery) Allyn Kenner, MD (Dermatology) Celestia Khat, [place] (Optometry)   Chief Complaint:  Establish Care Blanch Media patient /) and Medical Management of Chronic Issues   HPI: Peter Howe is a 82 y.o. male presenting on 04/30/2022 for Establish Care Blanch Media patient /) and Medical Management of Chronic Issues  Pt presents today to establish care with new PCP and for management of chronic medical conditions.   1. Essential hypertension with goal blood pressure less than 130/80 Complaint with meds - Yes Current Medications - atenolol, losartan Checking BP at home - no Exercising Regularly - Yes Watching Salt intake - Yes Pertinent ROS:  Headache - No Fatigue - No Visual Disturbances - No Chest pain - No Dyspnea - No Palpitations - No LE edema - No They report good compliance with medications and can restate their regimen by memory. No medication side effects.  BP Readings from Last 3 Encounters:  04/30/22 138/76  04/15/22 123/64  03/10/22 109/72    2. Permanent atrial fibrillation (HCC) Rate controlled and on Xarelto. Denies palpitations, chest pain, dizziness, or syncope. No abnormal bleeding or bruising. Followed by cardiology on a regular basis.   3. PVD (peripheral vascular disease) ([place]) Denies claudication symptoms. On statin and Xarelto.   4. Aortic atherosclerosis (HCC) On statin therapy and Xarelto.  5. Panic attacks Does well with Zoloft. Denies recent episodes. Compliant with medications.   6. Primary insomnia Takes as needed Ambien  and is tolerating well. Does have some nights that he continues to have issues sleeping. No daytime fatigue or brain fog from the medications.    Relevant past medical, surgical, family, and social history reviewed and updated as indicated.  Allergies and medications reviewed and updated. Data reviewed: Chart in Epic.   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 Tawanna Solo TUNNEL RELEASE Left 2022   [place]  03/28/2003   [employer] Jude-PPM   PPM GENERATOR CHANGEOUT N/A 11/24/2018   Procedure: PPM GENERATOR CHANGEOUT;  Surgeon: [name], MD;  Location: [facility];  Service: Cardiovascular;  Laterality: N/A;    Social History   Socioeconomic History   Marital status: Widowed    Spouse name: Not on file   Number of children: 1   Years of education: 12   Highest education level: High school graduate  Occupational History   Occupation: runs a Engineer, technical sales  Comment: part time  Tobacco Use   Smoking status: Former    Packs/day: 1.00    Years: 30.00    Total pack years: 30.00    Types: Cigarettes    Start date: 03/30/1955    Quit date: 03/09/1993    Years since quitting: 29.1    Passive exposure: Past   Smokeless tobacco: Never  Vaping Use   Vaping Use: Never used  Substance and Sexual Activity   Alcohol use: Yes    Alcohol/week: 7.0 standard drinks of alcohol    Types: 1 Glasses of wine, 4 Cans  of beer, 2 Shots of liquor per week   Drug use: No   Sexual activity: Not Currently    Partners: Female  Other Topics Concern   Not on file  Social History Narrative   Owns a Engineer, technical sales, enjoys golf.   Lives in [place] miles away   Social Determinants of Health   Financial Resource Strain: Low Risk  (12/08/2021)   Overall Financial Resource Strain (CARDIA)    Difficulty of Paying Living Expenses: Not hard at all  Food Insecurity: No Food Insecurity (12/08/2021)   Hunger Vital Sign    Worried About Running Out of Food in the Last Year: Never true    Ran Out of Food in the Last Year: Never true  Transportation Needs: No Transportation Needs (12/08/2021)   PRAPARE - Hydrologist (Medical): No    Lack of Transportation (Non-Medical): No  Physical Activity: Sufficiently Active (12/08/2021)   Exercise Vital Sign    Days of Exercise per Week: 5 days    Minutes of Exercise per Session: 60 min  Stress: No Stress Concern Present (12/08/2021)   [place]    Feeling of Stress : Not at all  Social Connections: Moderately Integrated (12/08/2021)   Social Connection and Isolation Panel [NHANES]    Frequency of Communication with Friends and Family: More than three times a week    Frequency of Social Gatherings with Friends and Family: More than three times a week    Attends Religious Services: More than 4 times per year    Active Member of [employer] or Organizations: Yes    Attends Archivist Meetings: More than 4 times per year    Marital Status: Widowed  Intimate Partner Violence: Not At Risk (12/08/2021)   Humiliation, Afraid, Rape, and Kick questionnaire    Fear of Current or Ex-Partner: No    Emotionally Abused: No    Physically Abused: No    Sexually Abused: No    Outpatient Encounter Medications as of 04/30/2022  Medication Sig   acetaminophen (TYLENOL) 325 MG  tablet Take 650 mg by mouth every 6 (six) hours as needed for mild pain.   atenolol (TENORMIN) 50 MG tablet Take 1 tablet (50 mg total) by mouth daily.   colchicine 0.6 MG tablet Take 1.2 mg (2 tablets) by mouth at the first sign of a gout flare followed by 0.6 mg (1 tablet) one hour later.   Multiple Vitamin (MULTIVITAMIN WITH MINERALS) TABS tablet Take 1 tablet by mouth daily.   nitroGLYCERIN (NITROSTAT) 0.4 MG SL tablet Place 1 tablet under the tongue every 5 (five) minutes as needed for chest pain.   rivaroxaban (XARELTO) 20 MG TABS tablet Take 1 tablet (20 mg total) by mouth daily with supper.   tiZANidine (ZANAFLEX) 4 MG tablet Take 1 tablet (4  mg total) by mouth every 6 (six) hours as needed for muscle spasms.   [DISCONTINUED] losartan (COZAAR) 50 MG tablet Take 1 tablet (50 mg total) by mouth daily.   [DISCONTINUED] rosuvastatin (CRESTOR) 5 MG tablet Take 1 tablet (5 mg total) by mouth once a week.   [DISCONTINUED] sertraline (ZOLOFT) 25 MG tablet Take 1 tablet (25 mg total) by mouth daily.   [DISCONTINUED] zolpidem (AMBIEN) 5 MG tablet Take 1 tablet (5 mg total) by mouth at bedtime.   losartan (COZAAR) 50 MG tablet Take 1 tablet (50 mg total) by mouth daily.   rosuvastatin (CRESTOR) 5 MG tablet Take 1 tablet (5 mg total) by mouth once a week.   sertraline (ZOLOFT) 25 MG tablet Take 1 tablet (25 mg total) by mouth daily.   zolpidem (AMBIEN) 5 MG tablet Take 1 tablet (5 mg total) by mouth at bedtime.   [DISCONTINUED] HYDROcodone-acetaminophen (NORCO) 5-325 MG tablet Take 1 tablet by mouth every 6 (six) hours as needed for moderate pain.   [DISCONTINUED] tamsulosin (FLOMAX) 0.4 MG CAPS capsule Take 1 capsule (0.4 mg total) by mouth daily.   No facility-administered encounter medications on file as of 04/30/2022.    Allergies  Allergen Reactions   Morphine And Related Other (See Comments)    Patient states it wired him up and didn't help. Prefers not to take again.   Penicillins Rash     Has patient had a PCN reaction causing immediate rash, facial/tongue/throat swelling, SOB or lightheadedness with hypotension: No Has patient had a PCN reaction causing severe rash involving mucus membranes or skin necrosis: No Has patient had a PCN reaction that required hospitalization No Has patient had a PCN reaction occurring within the last 10 years: No If all of the above answers are "NO", then may proceed with Cephalosporin use.    Statins    Sulfonamide Derivatives Rash    Review of Systems  Constitutional:  Negative for activity change, appetite change, chills, diaphoresis, fatigue, fever and unexpected weight change.  HENT: Negative.    Eyes: Negative.  Negative for photophobia and visual disturbance.  Respiratory:  Negative for cough, chest tightness and shortness of breath.   Cardiovascular:  Negative for chest pain, palpitations and leg swelling.  Gastrointestinal:  Negative for abdominal pain, blood in stool, constipation, diarrhea, nausea and vomiting.  Endocrine: Negative.  Negative for polydipsia, polyphagia and polyuria.  Genitourinary:  Negative for decreased urine volume, difficulty urinating, dysuria, frequency and urgency.  Musculoskeletal:  Negative for arthralgias and myalgias.  Skin: Negative.   Allergic/Immunologic: Negative.   Neurological:  Negative for dizziness, tremors, seizures, syncope, facial asymmetry, speech difficulty, weakness, light-headedness, numbness and headaches.  Hematological: Negative.   Psychiatric/Behavioral:  Positive for sleep disturbance. Negative for agitation, behavioral problems, confusion, decreased concentration, dysphoric mood, hallucinations, self-injury and suicidal ideas. The patient is not nervous/anxious and is not hyperactive.   All other systems reviewed and are negative.       Objective:  BP 138/76   Pulse 64   Temp (!) 97.5 F (36.4 C) (Temporal)   Ht 5' 4"$  (1.626 m)   Wt 177 lb 9.6 oz (80.6 kg)   SpO2 95%   BMI  30.48 kg/m    Wt Readings from Last 3 Encounters:  04/30/22 177 lb 9.6 oz (80.6 kg)  04/15/22 177 lb 12.8 oz (80.6 kg)  03/10/22 175 lb 6.4 oz (79.6 kg)    Physical Exam Vitals and nursing note reviewed.  Constitutional:  General: He is not in acute distress.    Appearance: Normal appearance. He is well-developed and well-groomed. He is obese. He is not ill-appearing, toxic-appearing or diaphoretic.  HENT:     Head: Normocephalic and atraumatic.     Jaw: There is normal jaw occlusion.     Right Ear: Hearing normal.     Left Ear: Hearing normal.     Nose: Nose normal.     Mouth/Throat:     Lips: Pink.     Mouth: Mucous membranes are moist.     Pharynx: Oropharynx is clear. Uvula midline.  Eyes:     General: Lids are normal.     Extraocular Movements: Extraocular movements intact.     Conjunctiva/sclera: Conjunctivae normal.     Pupils: Pupils are equal, round, and reactive to light.  Neck:     Thyroid: No thyroid mass, thyromegaly or thyroid tenderness.     Vascular: No carotid bruit or JVD.     Trachea: Trachea and phonation normal.  Cardiovascular:     Rate and Rhythm: Normal rate. Rhythm irregularly irregular.     Chest Wall: PMI is not displaced.     Pulses: Normal pulses.     Heart sounds: Normal heart sounds. No murmur heard.    No friction rub. No gallop.  Pulmonary:     Effort: Pulmonary effort is normal. No respiratory distress.     Breath sounds: Normal breath sounds. No wheezing.  Abdominal:     General: Bowel sounds are normal. There is no distension or abdominal bruit.     Palpations: Abdomen is soft. There is no hepatomegaly or splenomegaly.     Tenderness: There is no abdominal tenderness. There is no right CVA tenderness or left CVA tenderness.     Hernia: No hernia is present.  Musculoskeletal:        General: Normal range of motion.     Cervical back: Normal range of motion and neck supple.     Right lower leg: No edema.     Left lower leg: No  edema.  Lymphadenopathy:     Cervical: No cervical adenopathy.  Skin:    General: Skin is warm and dry.     Capillary Refill: Capillary refill takes less than 2 seconds.     Coloration: Skin is not cyanotic, jaundiced or pale.     Findings: No rash.  Neurological:     General: No focal deficit present.     Mental Status: He is alert and oriented to person, place, and time.     Sensory: Sensation is intact.     Motor: Motor function is intact.     Coordination: Coordination is intact.     Gait: Gait is intact.     Deep Tendon Reflexes: Reflexes are normal and symmetric.  Psychiatric:        Attention and Perception: Attention and perception normal.        Mood and Affect: Mood and affect normal.        Speech: Speech normal.        Behavior: Behavior normal. Behavior is cooperative.        Thought Content: Thought content normal.        Cognition and Memory: Cognition and memory normal.        Judgment: Judgment normal.     Results for orders placed or performed in visit on 03/03/22  Microscopic Examination   Urine  Result Value Ref Range   WBC, UA None  seen 0 - 5 /hpf   RBC, Urine None seen 0 - 2 /hpf   Epithelial Cells (non renal) None seen 0 - 10 /hpf   Renal Epithel, UA None seen None seen /hpf   Bacteria, UA None seen None seen/Few  Urinalysis, Complete  Result Value Ref Range   Specific Gravity, UA <1.005 (L) 1.005 - 1.030   pH, UA 5.5 5.0 - 7.5   Color, UA Yellow Yellow   Appearance Ur Clear Clear   Leukocytes,UA Negative Negative   Protein,UA Trace (A) Negative/Trace   Glucose, UA Negative Negative   Ketones, UA Negative Negative   RBC, UA Trace (A) Negative   Bilirubin, UA Negative Negative   Urobilinogen, Ur 0.2 0.2 - 1.0 mg/dL   Nitrite, UA Negative Negative   Microscopic Examination See below:        Pertinent labs & imaging results that were available during my care of the patient were reviewed by me and considered in my medical decision  making.  Assessment & Plan:  [place] was seen today for establish care and medical management of chronic issues.  Diagnoses and all orders for this visit:  Essential hypertension with goal blood pressure less than 130/80 BP well controlled. Changes were not made in regimen today. Goal BP is 130/80. Pt aware to report any persistent high or low readings. DASH diet and exercise encouraged. Exercise at least 150 minutes per week and increase as tolerated. Goal BMI > 25. Stress management encouraged. Avoid nicotine and tobacco product use. Avoid excessive alcohol and NSAID's. Avoid more than 2000 mg of sodium daily. Medications as prescribed. Follow up as scheduled.  -     losartan (COZAAR) 50 MG tablet; Take 1 tablet (50 mg total) by mouth daily. -     CBC with Differential/Platelet -     CMP14+EGFR -     Lipid panel -     Thyroid Panel With TSH  Permanent atrial fibrillation (HCC) Rate controlled and on Xarelto. Doing well. Followed by cardiology on a regular basis.  -     CBC with Differential/Platelet -     CMP14+EGFR -     Lipid panel -     Thyroid Panel With TSH  PVD (peripheral vascular disease) ([place]) Denies claudication symptoms. On statin and Xarelto.  -     rosuvastatin (CRESTOR) 5 MG tablet; Take 1 tablet (5 mg total) by mouth once a week. -     CBC with Differential/Platelet -     Lipid panel  Aortic atherosclerosis (HCC) On statin and Xarelto. Will check labs today.  -     rosuvastatin (CRESTOR) 5 MG tablet; Take 1 tablet (5 mg total) by mouth once a week. -     CBC with Differential/Platelet -     CMP14+EGFR -     Lipid panel  Panic attacks Doing well on below. Will continue.  -     sertraline (ZOLOFT) 25 MG tablet; Take 1 tablet (25 mg total) by mouth daily.  Primary insomnia Controlled substance agreement signed Doing well on below. PDMP reviewed and no red flags present. Will continue. CSA and Toxassure updated today.  -     zolpidem (AMBIEN) 5 MG tablet; Take 1  tablet (5 mg total) by mouth at bedtime. -     ToxASSURE Select 13 (MW), Urine     Continue all other maintenance medications.  Follow up plan: Return in about 3 months (around 07/29/2022) for chronic follow up.   Continue  healthy lifestyle choices, including diet (rich in fruits, vegetables, and lean proteins, and low in salt and simple carbohydrates) and exercise (at least 30 minutes of moderate physical activity daily).  Educational handout given for insomnia  The above assessment and management plan was discussed with the patient. The patient verbalized understanding of and has agreed to the management plan. Patient is aware to call the clinic if they develop any new symptoms or if symptoms persist or worsen. Patient is aware when to return to the clinic for a follow-up visit. Patient educated on when it is appropriate to go to the emergency department.   Monia Pouch, FNP-C [facility] [phone]

## 2022-05-01 LAB — CMP14+EGFR
ALT: 24 IU/L (ref 0–44)
AST: 26 IU/L (ref 0–40)
Albumin/Globulin Ratio: 1.9 (ref 1.2–2.2)
Albumin: 3.9 g/dL (ref 3.7–4.7)
Alkaline Phosphatase: 87 IU/L (ref 44–121)
BUN/Creatinine Ratio: 16 (ref 10–24)
BUN: 14 mg/dL (ref 8–27)
Bilirubin Total: 0.7 mg/dL (ref 0.0–1.2)
CO2: 24 mmol/L (ref 20–29)
Calcium: 9.2 mg/dL (ref 8.6–10.2)
Chloride: 100 mmol/L (ref 96–106)
Creatinine, Ser: 0.87 mg/dL (ref 0.76–1.27)
Globulin, Total: 2.1 g/dL (ref 1.5–4.5)
Glucose: 91 mg/dL (ref 70–99)
Potassium: 4.6 mmol/L (ref 3.5–5.2)
Sodium: 141 mmol/L (ref 134–144)
Total Protein: 6 g/dL (ref 6.0–8.5)
eGFR: 86 mL/min/{1.73_m2} (ref >59)

## 2022-05-01 LAB — LIPID PANEL
Chol/HDL Ratio: 1.9 ratio (ref 0.0–5.0)
Cholesterol, Total: 175 mg/dL (ref 100–199)
HDL: 91 mg/dL (ref >39)
LDL Chol Calc (NIH): 73 mg/dL (ref 0–99)
Triglycerides: 57 mg/dL (ref 0–149)
VLDL Cholesterol Cal: 11 mg/dL (ref 5–40)

## 2022-05-01 LAB — CBC WITH DIFFERENTIAL/PLATELET
Basophils Absolute: 0.1 10*3/uL (ref 0.0–0.2)
Basos: 1 %
EOS (ABSOLUTE): 0.2 10*3/uL (ref 0.0–0.4)
Eos: 2 %
Hematocrit: 42.9 % (ref 37.5–51.0)
Hemoglobin: 14.3 g/dL (ref 13.0–17.7)
Immature Grans (Abs): 0 10*3/uL (ref 0.0–0.1)
Immature Granulocytes: 0 %
Lymphocytes Absolute: 1.1 10*3/uL (ref 0.7–3.1)
Lymphs: 12 %
MCH: 31.7 pg (ref 26.6–33.0)
MCHC: 33.3 g/dL (ref 31.5–35.7)
MCV: 95 fL (ref 79–97)
Monocytes Absolute: 0.8 10*3/uL (ref 0.1–0.9)
Monocytes: 9 %
Neutrophils Absolute: 7.5 10*3/uL — ABNORMAL HIGH (ref 1.4–7.0)
Neutrophils: 76 %
Platelets: 191 10*3/uL (ref 150–450)
RBC: 4.51 x10E6/uL (ref 4.14–5.80)
RDW: 14.1 % (ref 11.6–15.4)
WBC: 9.7 10*3/uL (ref 3.4–10.8)

## 2022-05-01 LAB — THYROID PANEL WITH TSH
Free Thyroxine Index: 1.9 (ref 1.2–4.9)
T3 Uptake Ratio: 32 % (ref 24–39)
T4, Total: 6 ug/dL (ref 4.5–12.0)
TSH: 0.997 u[IU]/mL (ref 0.450–4.500)

## 2022-05-03 LAB — TOXASSURE SELECT 13 (MW), URINE

## 2022-05-05 ENCOUNTER — Encounter: Payer: Self-pay | Admitting: *Deleted

## 2022-05-05 ENCOUNTER — Ambulatory Visit: Payer: Medicare HMO | Admitting: *Deleted

## 2022-05-05 ENCOUNTER — Ambulatory Visit: Payer: Medicare HMO

## 2022-05-05 DIAGNOSIS — M6281 Muscle weakness (generalized): Secondary | ICD-10-CM | POA: Diagnosis not present

## 2022-05-05 DIAGNOSIS — I495 Sick sinus syndrome: Secondary | ICD-10-CM | POA: Diagnosis not present

## 2022-05-05 DIAGNOSIS — M25611 Stiffness of right shoulder, not elsewhere classified: Secondary | ICD-10-CM

## 2022-05-05 DIAGNOSIS — M25511 Pain in right shoulder: Secondary | ICD-10-CM | POA: Diagnosis not present

## 2022-05-05 DIAGNOSIS — G8929 Other chronic pain: Secondary | ICD-10-CM | POA: Diagnosis not present

## 2022-05-05 NOTE — Therapy (Signed)
OUTPATIENT PHYSICAL THERAPY SHOULDER TREATMENT   Patient Name: Peter Howe MRN: [id number] DOB:03-01-1941, 82 y.o., male Today's Date: 05/05/2022  END OF SESSION:  PT End of Session - 05/05/22 0904     Visit Number 4    Number of Visits 8    Date for PT Re-Evaluation 07/03/22    PT Start Time 0900    PT Stop Time 0958    PT Time Calculation (min) 58 min            Past Medical History:  Diagnosis Date   Anxiety    on meds   Aortic atherosclerosis ([place])    Arthritis    generalized   Bilateral inguinal hernia 01/10/2021   Cancer ([place])    skin cancers removed   Diverticulosis 01/10/2021   GERD (gastroesophageal reflux disease)    on meds   Hyperlipidemia    on meds   Hypertension    on meds   Overweight(278.02)    obesity   Permanent atrial fibrillation ([facility])    Seasonal allergies    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 Tawanna Solo TUNNEL RELEASE Left 2022   [place]  03/28/2003   St. Jude-PPM   PPM GENERATOR CHANGEOUT N/A 11/24/2018   Procedure: PPM GENERATOR CHANGEOUT;  Surgeon: [name], MD;  Location: [facility];  Service: Cardiovascular;  Laterality: N/A;   Patient Active Problem List   Diagnosis Date Noted   PVD (peripheral vascular disease) ([place]) 04/30/2022   Panic attacks 04/30/2022   Lumbar spondylosis 03/10/2022   Bilateral inguinal hernia 01/10/2021   Aortic atherosclerosis ([place]) 06/24/2020   Carpal tunnel syndrome, left upper limb 05/15/2020   Primary insomnia 05/19/2019   Controlled substance agreement signed 05/19/2019   Elevated liver enzymes 06/08/2017   Pacemaker 06/04/2017   Gastroesophageal reflux disease 02/05/2016   Essential hypertension with goal blood pressure less than 130/80 09/18/2013   ATRIAL FIBRILLATION 01/02/2010   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    REFERRING PROVIDER: Claretta Fraise, MD   REFERRING DIAG: Acute pain of right shoulder   THERAPY DIAG:  Chronic right shoulder pain  Stiffness of right shoulder, not elsewhere classified  Muscle weakness (generalized)  Rationale for Evaluation and Treatment: Rehabilitation  ONSET DATE: 3 months ago  SUBJECTIVE:  SUBJECTIVE STATEMENT: Pain is at certain movements still. Did okay aftrer last Rx  PERTINENT HISTORY: Atrial fibrillation, HTN, low back pain, anxiety, OA, history of cancer, and presence of a pacemaker  PAIN:  Are you having pain? Yes: NPRS scale: 3/10 Pain location: bilateral shoulders down to mid humerus Pain description: sharp Aggravating factors: moving his arms Relieving factors: rest  PRECAUTIONS: ICD/Pacemaker  PATIENT GOALS:reduced pain, improved strength, and return to playing golf  NEXT MD VISIT: 04/30/21  OBJECTIVE:   PATIENT SURVEYS:  FOTO 48% limitation  POSTURE: Forward head, rounded shoulders, and increased thoracic kyphosis  UPPER EXTREMITY ROM:   Active ROM Right eval Left eval  Shoulder flexion 111; painful  124; painful   Shoulder extension    Shoulder abduction 105; painful 108; painful   Shoulder adduction    Shoulder internal rotation To L5; painful To L3; painful   Shoulder external rotation To T2; "tight and pain" To T3; painful   Elbow flexion    Elbow extension    Wrist flexion    Wrist extension    Wrist ulnar deviation    Wrist radial deviation    Wrist pronation    Wrist supination    (Blank rows = not tested)  UPPER EXTREMITY MMT:  MMT Right eval Left eval  Shoulder flexion 4/5; painful 4/5; painful   Shoulder extension    Shoulder abduction 3+/5; painful 3+/5; painful   Shoulder adduction    Shoulder internal rotation 4-/5;  painful 4/5; painful  Shoulder external rotation 4-/5; painful 4/5; painful   Middle trapezius    Lower trapezius    Elbow flexion    Elbow extension    Wrist flexion    Wrist extension    Wrist ulnar deviation    Wrist radial deviation    Wrist pronation    Wrist supination    Grip strength (lbs)    (Blank rows = not tested)  JOINT MOBILITY TESTING:  Glenohumeral: hypomobile and painful bilaterally  PALPATION:  TTP: bilateral latissimus dorsi, left infraspinatus, right upper trapezius, and supraspinatus   TODAY'S TREATMENT:                                                                                                                                         DATE:                                                                                    2/27  EXERCISE LOG  Exercise Repetitions and Resistance Comments  UBE 90  RPM x6 min   Pulley X4  min   Wall slides    R shoulder isometric ER    R shoulder isometric abd  Reported pain from R shoulder to elbow  R shoulder isometric flex    R shoulder row    R shoulder extension    R shoulder IR     Blank cell = exercise not performed today  Postural stretch in standing with shldr ER as tolerated. Supine H-ABD/ ADD x10 f/b pec stretch in horizontal ABD with arm off the mat Manual Therapy: STW/TPR to UT, Levator, Pec major and minor.x-friction to ACJ PROM for flexion, ABD, and ER Modalities  Date: 2/21 Unattended Estim: Shoulder, Pre-Mod, 15 mins, Pain Hot Pack: Shoulder, 15 mins, Pain                 04/27/22 EXERCISE LOG  Exercise Repetitions and Resistance Comments  Pulleys X5 min   B shoulder wall slides X20 reps   R shoulder extension Yellow x15 reps   R shoulder row Yellow x20 reps   R shoulder IR Yellow x20 reps   R shoulder isometric ER 5 reps 5 sec holds   R shoulder isometric abd 5 reps 5 sec holds    Blank cell = exercise not performed today   Manual Therapy Myofascial Release: R bicep/deltoids, IASTW to  reduce tone of deltoids    Modalities  Date: 04/27/2022 Unattended Estim: Shoulder, Pre-Mod, 15 mins, Pain and Tone Hot Pack: Shoulder, 15 mins, Pain and Tone  PATIENT EDUCATION: Education details: HEP, healing, prognosis, POC, and goals for therapy Person educated: Patient Education method: Explanation Education comprehension: verbalized understanding  HOME EXERCISE PROGRAM: Today's interventions were provided as his HEP. He was recommended to complete these interventions twice per day.   ASSESSMENT:  CLINICAL IMPRESSION: Pt arrived today doing fairly well with RT shldr, but continues to have familiar pain with certain movements. Rx focused on therex as well as manual STW and PROM to RT shldr with Pt supine. Increased ROM and decreased mm tension/ soreness end of session.    OBJECTIVE IMPAIRMENTS: decreased ROM, decreased strength, hypomobility, impaired tone, postural dysfunction, and pain.   ACTIVITY LIMITATIONS: lifting and reach over head  PARTICIPATION LIMITATIONS: cleaning, community activity, and yard work  PERSONAL FACTORS: Age, Time since onset of injury/illness/exacerbation, and 3+ comorbidities: Atrial fibrillation, HTN, low back pain, anxiety, OA, history of cancer, and presence of a pacemaker  are also affecting patient's functional outcome.   REHAB POTENTIAL: Good  CLINICAL DECISION MAKING: Evolving/moderate complexity  EVALUATION COMPLEXITY: Moderate  GOALS: Goals reviewed with patient? Yes  LONG TERM GOALS: Target date: 05/21/22  Patient will be independent with his HEP.  Baseline:  Goal status: INITIAL  2.  Patient will be able return to golf without his familiar pain exceeding 3/10. Baseline:  Goal status: INITIAL  3.  Patient will be able to demonstrate at least 125 degrees of active right shoulder flexion for improved function with overhead activities. Baseline:  Goal status: INITIAL  4.  Patient will be able to demonstrate at least 4/5 right  shoulder strength in all planes without being limited by his familiar pain.  Baseline:  Goal status: INITIAL  PLAN:  PT FREQUENCY: 2x/week  PT DURATION: 4 weeks  PLANNED INTERVENTIONS: Therapeutic exercises, Therapeutic activity, Neuromuscular re-education, Patient/Family education, Self Care, Joint mobilization, Cryotherapy, Moist heat, Vasopneumatic device, Manual therapy, and Re-evaluation  PLAN FOR NEXT SESSION: pulleys, isometrics, rotator cuff strengthening, and modalities as needed  Takira Sherrin,CHRIS, PTA 05/05/2022, 10:15 AM

## 2022-05-06 LAB — CUP PACEART REMOTE DEVICE CHECK
Battery Remaining Longevity: 102 mo
Battery Remaining Percentage: 77 %
Battery Voltage: 3.01 V
Brady Statistic RV Percent Paced: 57 %
Date Time Interrogation Session: 20240227061653
Implantable Lead Connection Status: 753985
Implantable Lead Connection Status: 753985
Implantable Lead Implant Date: 19980120
Implantable Lead Implant Date: 20050119
Implantable Lead Location: 753859
Implantable Lead Location: 753860
Implantable Pulse Generator Implant Date: 20200917
Lead Channel Impedance Value: 680 Ohm
Lead Channel Pacing Threshold Amplitude: 0.75 V
Lead Channel Pacing Threshold Pulse Width: 0.4 ms
Lead Channel Sensing Intrinsic Amplitude: 12 mV
Lead Channel Setting Pacing Amplitude: 2.5 V
Lead Channel Setting Pacing Pulse Width: 0.4 ms
Lead Channel Setting Sensing Sensitivity: 2 mV
Pulse Gen Model: 2272
Pulse Gen Serial Number: 9154140

## 2022-05-07 ENCOUNTER — Ambulatory Visit: Payer: Medicare HMO

## 2022-05-07 DIAGNOSIS — G8929 Other chronic pain: Secondary | ICD-10-CM | POA: Diagnosis not present

## 2022-05-07 DIAGNOSIS — M6281 Muscle weakness (generalized): Secondary | ICD-10-CM

## 2022-05-07 DIAGNOSIS — M25511 Pain in right shoulder: Secondary | ICD-10-CM | POA: Diagnosis not present

## 2022-05-07 DIAGNOSIS — M25611 Stiffness of right shoulder, not elsewhere classified: Secondary | ICD-10-CM

## 2022-05-07 NOTE — Therapy (Signed)
OUTPATIENT PHYSICAL THERAPY SHOULDER TREATMENT   Patient Name: Peter Howe MRN: [id number] DOB:12-Nov-1940, 82 y.o., male Today's Date: 05/07/2022  END OF SESSION:  PT End of Session - 05/07/22 0821     Visit Number 5    Number of Visits 8    Date for PT Re-Evaluation 07/03/22    PT Start Time 0815            Past Medical History:  Diagnosis Date   Anxiety    on meds   Aortic atherosclerosis ([place])    Arthritis    generalized   Bilateral inguinal hernia 01/10/2021   Cancer ([place])    skin cancers removed   Diverticulosis 01/10/2021   GERD (gastroesophageal reflux disease)    on meds   Hyperlipidemia    on meds   Hypertension    on meds   Overweight(278.02)    obesity   Permanent atrial fibrillation ([facility])    Seasonal allergies    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 Tawanna Solo TUNNEL RELEASE Left 2022   [place]  03/28/2003   St. Jude-PPM   PPM GENERATOR CHANGEOUT N/A 11/24/2018   Procedure: PPM GENERATOR CHANGEOUT;  Surgeon: [name], MD;  Location: [facility];  Service: Cardiovascular;  Laterality: N/A;   Patient Active Problem List   Diagnosis Date Noted   PVD (peripheral vascular disease) ([place]) 04/30/2022   Panic attacks 04/30/2022   Lumbar spondylosis 03/10/2022   Bilateral inguinal hernia 01/10/2021   Aortic atherosclerosis ([place]) 06/24/2020   Carpal tunnel syndrome, left upper limb 05/15/2020   Primary insomnia 05/19/2019   Controlled substance agreement signed 05/19/2019   Elevated liver enzymes 06/08/2017   Pacemaker 06/04/2017   Gastroesophageal reflux disease 02/05/2016   Essential hypertension with goal blood pressure less than 130/80 09/18/2013   ATRIAL FIBRILLATION 01/02/2010   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   REFERRING PROVIDER: Claretta Fraise, MD   REFERRING DIAG:  Acute pain of right shoulder   THERAPY DIAG:  Chronic right shoulder pain  Stiffness of right shoulder, not elsewhere classified  Muscle weakness (generalized)  Rationale for Evaluation and Treatment: Rehabilitation  ONSET DATE: 3 months ago  SUBJECTIVE:                                                                                                                                                                                      SUBJECTIVE STATEMENT: Pain is at certain movements still.    PERTINENT HISTORY: Atrial fibrillation, HTN,  low back pain, anxiety, OA, history of cancer, and presence of a pacemaker  PAIN:  Are you having pain? Yes: NPRS scale: 5/10 Pain location: bilateral shoulders down to mid humerus Pain description: sharp Aggravating factors: moving his arms Relieving factors: rest  PRECAUTIONS: ICD/Pacemaker  PATIENT GOALS:reduced pain, improved strength, and return to playing golf  NEXT MD VISIT: 04/30/21  OBJECTIVE:   PATIENT SURVEYS:  FOTO 48% limitation  POSTURE: Forward head, rounded shoulders, and increased thoracic kyphosis  UPPER EXTREMITY ROM:   Active ROM Right eval Left eval  Shoulder flexion 111; painful  124; painful   Shoulder extension    Shoulder abduction 105; painful 108; painful   Shoulder adduction    Shoulder internal rotation To L5; painful To L3; painful   Shoulder external rotation To T2; "tight and pain" To T3; painful   Elbow flexion    Elbow extension    Wrist flexion    Wrist extension    Wrist ulnar deviation    Wrist radial deviation    Wrist pronation    Wrist supination    (Blank rows = not tested)  UPPER EXTREMITY MMT:  MMT Right eval Left eval  Shoulder flexion 4/5; painful 4/5; painful   Shoulder extension    Shoulder abduction 3+/5; painful 3+/5; painful   Shoulder adduction    Shoulder internal rotation 4-/5; painful 4/5; painful  Shoulder external rotation 4-/5; painful 4/5; painful   Middle  trapezius    Lower trapezius    Elbow flexion    Elbow extension    Wrist flexion    Wrist extension    Wrist ulnar deviation    Wrist radial deviation    Wrist pronation    Wrist supination    Grip strength (lbs)    (Blank rows = not tested)  JOINT MOBILITY TESTING:  Glenohumeral: hypomobile and painful bilaterally  PALPATION:  TTP: bilateral latissimus dorsi, left infraspinatus, right upper trapezius, and supraspinatus   TODAY'S TREATMENT:                                                                                                                                         DATE:                                                                                    2/29  EXERCISE LOG  Exercise Repetitions and Resistance Comments  UBE 90  RPM x6 mins   Pulley x5 mins   Wall Ladder x5 reps bil   R shoulder isometric ER    R shoulder isometric  abd  Reported pain from R shoulder to elbow  R shoulder isometric flex    R shoulder row Red x 20 reps   R shoulder extension Red x 20 reps   R shoulder IR Red x 15 reps    Blank cell = exercise not performed today   Manual Therapy: STW/TPR to UT, Levator, Pec major and minor. PROM for flexion, ABD, and ER Modalities  Date: 2/29 Unattended Estim: Shoulder, Pre-Mod, 15 mins, Pain Hot Pack: Shoulder, 15 mins, Pain                 04/27/22 EXERCISE LOG  Exercise Repetitions and Resistance Comments  Pulleys X5 min   B shoulder wall slides X20 reps   R shoulder extension Yellow x15 reps   R shoulder row Yellow x20 reps   R shoulder IR Yellow x20 reps   R shoulder isometric ER 5 reps 5 sec holds   R shoulder isometric abd 5 reps 5 sec holds    Blank cell = exercise not performed today   Manual Therapy Myofascial Release: R bicep/deltoids, IASTW to reduce tone of deltoids    Modalities  Date: 04/27/2022 Unattended Estim: Shoulder, Pre-Mod, 15 mins, Pain and Tone Hot Pack: Shoulder, 15 mins, Pain and Tone  PATIENT  EDUCATION: Education details: HEP, healing, prognosis, POC, and goals for therapy Person educated: Patient Education method: Explanation Education comprehension: verbalized understanding  HOME EXERCISE PROGRAM: Today's interventions were provided as his HEP. He was recommended to complete these interventions twice per day.   ASSESSMENT:  CLINICAL IMPRESSION: Pt arrives for today's treatment session reporting 5/10 right shoulder pain with certain movements.  Pt able to increase FOTO score to 53 today.  Pt introduced to wall ladder today with min cues required to not "drop" arm after each rep.  Pt able to tolerate increased resistance with all tband exercises today with minimal discomfort.  STW/M performed to UT, Levator, Pec major and minor to decrease pain and tone.  Normal responses to estim and MH noted upon removal.  Pt reported decreased pain at completion of today's treatment session.    OBJECTIVE IMPAIRMENTS: decreased ROM, decreased strength, hypomobility, impaired tone, postural dysfunction, and pain.   ACTIVITY LIMITATIONS: lifting and reach over head  PARTICIPATION LIMITATIONS: cleaning, community activity, and yard work  PERSONAL FACTORS: Age, Time since onset of injury/illness/exacerbation, and 3+ comorbidities: Atrial fibrillation, HTN, low back pain, anxiety, OA, history of cancer, and presence of a pacemaker  are also affecting patient's functional outcome.   REHAB POTENTIAL: Good  CLINICAL DECISION MAKING: Evolving/moderate complexity  EVALUATION COMPLEXITY: Moderate  GOALS: Goals reviewed with patient? Yes  LONG TERM GOALS: Target date: 05/21/22  Patient will be independent with his HEP.  Baseline:  Goal status: MET  2.  Patient will be able return to golf without his familiar pain exceeding 3/10. Baseline:  Goal status: IN PROGRESS  3.  Patient will be able to demonstrate at least 125 degrees of active right shoulder flexion for improved function with  overhead activities. Baseline:  Goal status: IN PROGRESS  4.  Patient will be able to demonstrate at least 4/5 right shoulder strength in all planes without being limited by his familiar pain.  Baseline:  Goal status: IN PROGRESS  PLAN:  PT FREQUENCY: 2x/week  PT DURATION: 4 weeks  PLANNED INTERVENTIONS: Therapeutic exercises, Therapeutic activity, Neuromuscular re-education, Patient/Family education, Self Care, Joint mobilization, Cryotherapy, Moist heat, Vasopneumatic device, Manual therapy, and Re-evaluation  PLAN FOR NEXT SESSION: pulleys,  isometrics, rotator cuff strengthening, and modalities as needed  Kathrynn Ducking, PTA 05/07/2022, 8:22 AM

## 2022-05-11 ENCOUNTER — Ambulatory Visit: Payer: Medicare HMO | Attending: Family Medicine

## 2022-05-11 DIAGNOSIS — M25611 Stiffness of right shoulder, not elsewhere classified: Secondary | ICD-10-CM | POA: Insufficient documentation

## 2022-05-11 DIAGNOSIS — M25511 Pain in right shoulder: Secondary | ICD-10-CM | POA: Insufficient documentation

## 2022-05-11 DIAGNOSIS — G8929 Other chronic pain: Secondary | ICD-10-CM | POA: Diagnosis not present

## 2022-05-11 DIAGNOSIS — M6281 Muscle weakness (generalized): Secondary | ICD-10-CM | POA: Diagnosis not present

## 2022-05-11 NOTE — Therapy (Signed)
OUTPATIENT PHYSICAL THERAPY SHOULDER TREATMENT   Patient Name: Peter Howe MRN: [id number] DOB:1940-12-17, 82 y.o., male Today's Date: 05/11/2022  END OF SESSION:  PT End of Session - 05/11/22 0811     Visit Number 6    Number of Visits 8    Date for PT Re-Evaluation 07/03/22    PT Start Time 0815    PT Stop Time 0913    PT Time Calculation (min) 58 min    Activity Tolerance Patient tolerated treatment well    Behavior During Therapy [facility] for tasks assessed/performed            Past Medical History:  Diagnosis Date   Anxiety    on meds   Aortic atherosclerosis ([place])    Arthritis    generalized   Bilateral inguinal hernia 01/10/2021   Cancer ([place])    skin cancers removed   Diverticulosis 01/10/2021   GERD (gastroesophageal reflux disease)    on meds   Hyperlipidemia    on meds   Hypertension    on meds   Overweight(278.02)    obesity   Permanent atrial fibrillation ([facility])    Seasonal allergies    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 Tawanna Solo TUNNEL RELEASE Left 2022   [place]  03/28/2003   St. Jude-PPM   PPM GENERATOR CHANGEOUT N/A 11/24/2018   Procedure: PPM GENERATOR CHANGEOUT;  Surgeon: [name], MD;  Location: [place] [facility];  Service: Cardiovascular;  Laterality: N/A;   Patient Active Problem List   Diagnosis Date Noted   PVD (peripheral vascular disease) ([place]) 04/30/2022   Panic attacks 04/30/2022   Lumbar spondylosis 03/10/2022   Bilateral inguinal hernia 01/10/2021   Aortic atherosclerosis ([place]) 06/24/2020   Carpal tunnel syndrome, left upper limb 05/15/2020   Primary insomnia 05/19/2019   Controlled substance agreement signed 05/19/2019   Elevated liver enzymes 06/08/2017   Pacemaker 06/04/2017   Gastroesophageal reflux disease 02/05/2016   Essential hypertension with goal blood pressure  less than 130/80 09/18/2013   ATRIAL FIBRILLATION 01/02/2010   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   REFERRING PROVIDER: Claretta Fraise, MD   REFERRING DIAG: Acute pain of right shoulder   THERAPY DIAG:  Chronic right shoulder pain  Stiffness of right shoulder, not elsewhere classified  Muscle weakness (generalized)  Rationale for Evaluation and Treatment: Rehabilitation  ONSET DATE: 3 months ago  SUBJECTIVE:  SUBJECTIVE STATEMENT: Patient reports that his shoulder are better, but they still hurt with movement.   PERTINENT HISTORY: Atrial fibrillation, HTN, low back pain, anxiety, OA, history of cancer, and presence of a pacemaker  PAIN:  Are you having pain? Yes: NPRS scale: 5/10 Pain location: bilateral shoulders down to mid humerus Pain description: sharp Aggravating factors: moving his arms Relieving factors: rest  PRECAUTIONS: ICD/Pacemaker  PATIENT GOALS:reduced pain, improved strength, and return to playing golf  NEXT MD VISIT: 04/30/21  OBJECTIVE:   PATIENT SURVEYS:  FOTO 48% limitation  POSTURE: Forward head, rounded shoulders, and increased thoracic kyphosis  UPPER EXTREMITY ROM:   Active ROM Right eval Left eval  Shoulder flexion 111; painful  124; painful   Shoulder extension    Shoulder abduction 105; painful 108; painful   Shoulder adduction    Shoulder internal rotation To L5; painful To L3; painful   Shoulder external rotation To T2; "tight and pain" To T3; painful   Elbow flexion    Elbow extension    Wrist flexion    Wrist extension    Wrist ulnar deviation    Wrist radial deviation    Wrist pronation    Wrist supination    (Blank rows = not tested)  UPPER EXTREMITY MMT:  MMT Right eval Left eval  Shoulder flexion 4/5; painful 4/5; painful    Shoulder extension    Shoulder abduction 3+/5; painful 3+/5; painful   Shoulder adduction    Shoulder internal rotation 4-/5; painful 4/5; painful  Shoulder external rotation 4-/5; painful 4/5; painful   Middle trapezius    Lower trapezius    Elbow flexion    Elbow extension    Wrist flexion    Wrist extension    Wrist ulnar deviation    Wrist radial deviation    Wrist pronation    Wrist supination    Grip strength (lbs)    (Blank rows = not tested)  JOINT MOBILITY TESTING:  Glenohumeral: hypomobile and painful bilaterally  PALPATION:  TTP: bilateral latissimus dorsi, left infraspinatus, right upper trapezius, and supraspinatus   TODAY'S TREATMENT:                                                                                                                                         DATE:  3/4 EXERCISE LOG  Exercise Repetitions and Resistance Comments  UBE  X10 minutes @ 90 RPM   Wall ladder 7 reps each  Max 24#   Resisted row  Green t-band x 20 reps    Therabar bending Red t-bar x 3 minutes   Resisted ADD  Blue t-band x 2 minutes   Resisted R shoulder IR  Red t-band x 2 minutes    Ball roll out  20 reps  For shoulder flexion   Blank cell = exercise not performed today  Manual Therapy Soft Tissue Mobilization: right supraspinatus, upper trapezius, infraspinatus, and deltoid, for reduced pain and tone   Modalities  Date:  Unattended Estim: right supraspinatus, pre mod @ 80-150 Hz, 15 mins, Pain and Tone Hot Pack: Shoulder, 15 mins, Pain and Tone  2/29  EXERCISE LOG  Exercise Repetitions and Resistance Comments  UBE 90  RPM x6 mins   Pulley x5 mins   Wall Ladder x5 reps bil   R shoulder isometric ER    R shoulder isometric abd  Reported pain from R shoulder to elbow  R shoulder isometric flex    R shoulder row Red x 20 reps   R shoulder extension  Red x 20 reps   R shoulder IR Red x 15 reps    Blank cell = exercise not performed today   Manual Therapy: STW/TPR to UT, Levator, Pec major and minor. PROM for flexion, ABD, and ER Modalities  Date: 2/29 Unattended Estim: Shoulder, Pre-Mod, 15 mins, Pain Hot Pack: Shoulder, 15 mins, Pain                 04/27/22 EXERCISE LOG  Exercise Repetitions and Resistance Comments  Pulleys X5 min   B shoulder wall slides X20 reps   R shoulder extension Yellow x15 reps   R shoulder row Yellow x20 reps   R shoulder IR Yellow x20 reps   R shoulder isometric ER 5 reps 5 sec holds   R shoulder isometric abd 5 reps 5 sec holds    Blank cell = exercise not performed today   Manual Therapy Myofascial Release: R bicep/deltoids, IASTW to reduce tone of deltoids    Modalities  Date: 04/27/2022 Unattended Estim: Shoulder, Pre-Mod, 15 mins, Pain and Tone Hot Pack: Shoulder, 15 mins, Pain and Tone  PATIENT EDUCATION: Education details: HEP, healing, prognosis, POC, and goals for therapy Person educated: Patient Education method: Explanation Education comprehension: verbalized understanding  HOME EXERCISE PROGRAM: Today's interventions were provided as his HEP. He was recommended to complete these interventions twice per day.   ASSESSMENT:  CLINICAL IMPRESSION: Patient was introduced to resisted shoulder adduction and therabar bending for improved rotator cuff and shoulder strengthening needed for improved function with his daily activities. He required minimal cueing with these interventions for proper pacing to avoid aggravating his familiar symptoms. Manual therapy focused on soft tissue mobilization to his right rotator cuff for reduced pain and tone with moderate effectiveness. He reported that his shoulder felt "good" upon the conclusion of treatment. He continues to require skilled physical therapy to address his remaining impairments to return to his prior level of function.   OBJECTIVE  IMPAIRMENTS: decreased ROM, decreased strength, hypomobility, impaired tone, postural dysfunction, and pain.   ACTIVITY LIMITATIONS: lifting and reach over head  PARTICIPATION LIMITATIONS: cleaning, community activity, and yard work  PERSONAL FACTORS: Age, Time since onset of injury/illness/exacerbation, and 3+ comorbidities: Atrial fibrillation, HTN, low back pain, anxiety, OA, history of cancer, and presence  of a pacemaker  are also affecting patient's functional outcome.   REHAB POTENTIAL: Good  CLINICAL DECISION MAKING: Evolving/moderate complexity  EVALUATION COMPLEXITY: Moderate  GOALS: Goals reviewed with patient? Yes  LONG TERM GOALS: Target date: 05/21/22  Patient will be independent with his HEP.  Baseline:  Goal status: MET  2.  Patient will be able return to golf without his familiar pain exceeding 3/10. Baseline:  Goal status: IN PROGRESS  3.  Patient will be able to demonstrate at least 125 degrees of active right shoulder flexion for improved function with overhead activities. Baseline:  Goal status: IN PROGRESS  4.  Patient will be able to demonstrate at least 4/5 right shoulder strength in all planes without being limited by his familiar pain.  Baseline:  Goal status: IN PROGRESS  PLAN:  PT FREQUENCY: 2x/week  PT DURATION: 4 weeks  PLANNED INTERVENTIONS: Therapeutic exercises, Therapeutic activity, Neuromuscular re-education, Patient/Family education, Self Care, Joint mobilization, Cryotherapy, Moist heat, Vasopneumatic device, Manual therapy, and Re-evaluation  PLAN FOR NEXT SESSION: pulleys, isometrics, rotator cuff strengthening, and modalities as needed  Darlin Coco, PT 05/11/2022, 9:17 AM

## 2022-05-13 ENCOUNTER — Ambulatory Visit: Payer: Medicare HMO

## 2022-05-13 DIAGNOSIS — M6281 Muscle weakness (generalized): Secondary | ICD-10-CM

## 2022-05-13 DIAGNOSIS — M25611 Stiffness of right shoulder, not elsewhere classified: Secondary | ICD-10-CM

## 2022-05-13 DIAGNOSIS — G8929 Other chronic pain: Secondary | ICD-10-CM | POA: Diagnosis not present

## 2022-05-13 DIAGNOSIS — M25511 Pain in right shoulder: Secondary | ICD-10-CM | POA: Diagnosis not present

## 2022-05-13 NOTE — Therapy (Signed)
OUTPATIENT PHYSICAL THERAPY SHOULDER TREATMENT   Patient Name: Peter Howe MRN: [id number] DOB:11-May-1940, 82 y.o., male Today's Date: 05/13/2022  END OF SESSION:  PT End of Session - 05/13/22 0818     Visit Number 7    Number of Visits 8    Date for PT Re-Evaluation 07/03/22    PT Start Time 0817    PT Stop Time 0900    PT Time Calculation (min) 43 min    Activity Tolerance Patient tolerated treatment well    Behavior During Therapy [facility] for tasks assessed/performed            Past Medical History:  Diagnosis Date   Anxiety    on meds   Aortic atherosclerosis ([place])    Arthritis    generalized   Bilateral inguinal hernia 01/10/2021   Cancer ([place])    skin cancers removed   Diverticulosis 01/10/2021   GERD (gastroesophageal reflux disease)    on meds   Hyperlipidemia    on meds   Hypertension    on meds   Overweight(278.02)    obesity   Permanent atrial fibrillation ([facility])    Seasonal allergies    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 Tawanna Solo TUNNEL RELEASE Left 2022   [place]  03/28/2003   St. Jude-PPM   PPM GENERATOR CHANGEOUT N/A 11/24/2018   Procedure: PPM GENERATOR CHANGEOUT;  Surgeon: [name], MD;  Location: [place] [facility];  Service: Cardiovascular;  Laterality: N/A;   Patient Active Problem List   Diagnosis Date Noted   PVD (peripheral vascular disease) ([place]) 04/30/2022   Panic attacks 04/30/2022   Lumbar spondylosis 03/10/2022   Bilateral inguinal hernia 01/10/2021   Aortic atherosclerosis ([place]) 06/24/2020   Carpal tunnel syndrome, left upper limb 05/15/2020   Primary insomnia 05/19/2019   Controlled substance agreement signed 05/19/2019   Elevated liver enzymes 06/08/2017   Pacemaker 06/04/2017   Gastroesophageal reflux disease 02/05/2016   Essential hypertension with goal blood pressure  less than 130/80 09/18/2013   ATRIAL FIBRILLATION 01/02/2010   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   REFERRING PROVIDER: Claretta Fraise, MD   REFERRING DIAG: Acute pain of right shoulder   THERAPY DIAG:  Chronic right shoulder pain  Stiffness of right shoulder, not elsewhere classified  Muscle weakness (generalized)  Rationale for Evaluation and Treatment: Rehabilitation  ONSET DATE: 3 months ago  SUBJECTIVE:  SUBJECTIVE STATEMENT: Patient reports that his shoulder feels better. He has noticed that his motion has improved and his pain is less than it was prior to therapy.   PERTINENT HISTORY: Atrial fibrillation, HTN, low back pain, anxiety, OA, history of cancer, and presence of a pacemaker  PAIN:  Are you having pain? Yes: NPRS scale: 5/10 Pain location: bilateral shoulders down to mid humerus Pain description: sharp Aggravating factors: moving his arms Relieving factors: rest  PRECAUTIONS: ICD/Pacemaker  PATIENT GOALS:reduced pain, improved strength, and return to playing golf  NEXT MD VISIT: 04/30/21  OBJECTIVE:   PATIENT SURVEYS:  FOTO 48% limitation  POSTURE: Forward head, rounded shoulders, and increased thoracic kyphosis  UPPER EXTREMITY ROM:   Active ROM Right eval Left eval  Shoulder flexion 111; painful  124; painful   Shoulder extension    Shoulder abduction 105; painful 108; painful   Shoulder adduction    Shoulder internal rotation To L5; painful To L3; painful   Shoulder external rotation To T2; "tight and pain" To T3; painful   Elbow flexion    Elbow extension    Wrist flexion    Wrist extension    Wrist ulnar deviation    Wrist radial deviation    Wrist pronation    Wrist supination    (Blank rows = not tested)  UPPER EXTREMITY MMT:  MMT  Right eval Left eval  Shoulder flexion 4/5; painful 4/5; painful   Shoulder extension    Shoulder abduction 3+/5; painful 3+/5; painful   Shoulder adduction    Shoulder internal rotation 4-/5; painful 4/5; painful  Shoulder external rotation 4-/5; painful 4/5; painful   Middle trapezius    Lower trapezius    Elbow flexion    Elbow extension    Wrist flexion    Wrist extension    Wrist ulnar deviation    Wrist radial deviation    Wrist pronation    Wrist supination    Grip strength (lbs)    (Blank rows = not tested)  JOINT MOBILITY TESTING:  Glenohumeral: hypomobile and painful bilaterally  PALPATION:  TTP: bilateral latissimus dorsi, left infraspinatus, right upper trapezius, and supraspinatus   TODAY'S TREATMENT:                                                                                                                                         DATE:  3/6 EXERCISE LOG  Exercise Repetitions and Resistance Comments  UBE X10 minutes @ 90 RPM   Resisted pull down Blue XTS x 2 minutes   Functional IR stretch 4 x 30 seconds    R shoulder IR  Green t-band x 30 reps    R shoulder ER  Green t-band x 20 reps    Wall slide (abduction) 2 minutes   Horizontal ABD Red t-band x 20 reps     Blank cell = exercise not performed today  Modalities  Date:  Unattended Estim: Shoulder, pre mod @ 80-150 Hz , 15 mins, Pain Hot Pack: Shoulder, 15 mins, Pain                                   3/4 EXERCISE LOG  Exercise Repetitions and Resistance Comments  UBE  X10 minutes @ 90 RPM   Wall ladder 7 reps each  Max 24#   Resisted row  Green t-band x 20 reps    Therabar bending Red t-bar x 3 minutes   Resisted ADD  Blue t-band x 2 minutes   Resisted R shoulder IR  Red t-band x 2 minutes    Ball roll out  20 reps  For shoulder flexion   Blank cell = exercise not performed  today  Manual Therapy Soft Tissue Mobilization: right supraspinatus, upper trapezius, infraspinatus, and deltoid, for reduced pain and tone   Modalities  Date:  Unattended Estim: right supraspinatus, pre mod @ 80-150 Hz, 15 mins, Pain and Tone Hot Pack: Shoulder, 15 mins, Pain and Tone  2/29  EXERCISE LOG  Exercise Repetitions and Resistance Comments  UBE 90  RPM x6 mins   Pulley x5 mins   Wall Ladder x5 reps bil   R shoulder isometric ER    R shoulder isometric abd  Reported pain from R shoulder to elbow  R shoulder isometric flex    R shoulder row Red x 20 reps   R shoulder extension Red x 20 reps   R shoulder IR Red x 15 reps    Blank cell = exercise not performed today   Manual Therapy: STW/TPR to UT, Levator, Pec major and minor. PROM for flexion, ABD, and ER Modalities  Date: 2/29 Unattended Estim: Shoulder, Pre-Mod, 15 mins, Pain Hot Pack: Shoulder, 15 mins, Pain  PATIENT EDUCATION: Education details: HEP, healing, prognosis, POC, and goals for therapy Person educated: Patient Education method: Explanation Education comprehension: verbalized understanding  HOME EXERCISE PROGRAM: Today's interventions were provided as his HEP. He was recommended to complete these interventions twice per day.   ASSESSMENT:  CLINICAL IMPRESSION: Patient was introduced to multiple new resisted interventions for improved rotator cuff strengthening. He required moderate verbal and tactile cueing with resisted external rotation to facilitate infraspinatus engagement while limiting shoulder abduction. He reported that his shoulder felt good upon the conclusion of treatment. He continues to require skilled physical therapy to address his remaining impairments to return to his prior level of function.   OBJECTIVE IMPAIRMENTS: decreased ROM, decreased strength, hypomobility, impaired tone, postural dysfunction, and pain.   ACTIVITY LIMITATIONS: lifting and reach over head  PARTICIPATION  LIMITATIONS: cleaning, community activity, and yard work  PERSONAL FACTORS: Age, Time since onset of injury/illness/exacerbation, and 3+ comorbidities: Atrial fibrillation, HTN, low back pain, anxiety, OA, history of cancer, and presence of a pacemaker  are also affecting patient's functional outcome.   REHAB POTENTIAL: Good  CLINICAL DECISION MAKING: Evolving/moderate complexity  EVALUATION COMPLEXITY: Moderate  GOALS: Goals reviewed with patient? Yes  LONG TERM GOALS: Target date: 05/21/22  Patient will be independent with his HEP.  Baseline:  Goal status: MET  2.  Patient will be able return to golf without his familiar pain exceeding 3/10. Baseline:  Goal status: IN PROGRESS  3.  Patient will be able to demonstrate at least 125 degrees of active right shoulder flexion for improved function with overhead activities. Baseline:  Goal status: IN PROGRESS  4.  Patient will be able to demonstrate at least 4/5 right shoulder strength in all planes without being limited by his familiar pain.  Baseline:  Goal status: IN PROGRESS  PLAN:  PT FREQUENCY: 2x/week  PT DURATION: 4 weeks  PLANNED INTERVENTIONS: Therapeutic exercises, Therapeutic activity, Neuromuscular re-education, Patient/Family education, Self Care, Joint mobilization, Cryotherapy, Moist heat, Vasopneumatic device, Manual therapy, and Re-evaluation  PLAN FOR NEXT SESSION: pulleys, isometrics, rotator cuff strengthening, and modalities as needed  Darlin Coco, PT 05/13/2022, 12:40 PM

## 2022-05-19 ENCOUNTER — Ambulatory Visit: Payer: Medicare HMO

## 2022-05-19 DIAGNOSIS — G8929 Other chronic pain: Secondary | ICD-10-CM | POA: Diagnosis not present

## 2022-05-19 DIAGNOSIS — M25611 Stiffness of right shoulder, not elsewhere classified: Secondary | ICD-10-CM | POA: Diagnosis not present

## 2022-05-19 DIAGNOSIS — M6281 Muscle weakness (generalized): Secondary | ICD-10-CM

## 2022-05-19 DIAGNOSIS — M25511 Pain in right shoulder: Secondary | ICD-10-CM | POA: Diagnosis not present

## 2022-05-19 NOTE — Therapy (Signed)
OUTPATIENT PHYSICAL THERAPY SHOULDER TREATMENT   Patient Name: Peter Howe MRN: [id number] DOB:1941/01/30, 82 y.o., male Today's Date: 05/19/2022  END OF SESSION:  PT End of Session - 05/19/22 0829     Visit Number 8    Number of Visits 12    Date for PT Re-Evaluation 07/03/22    PT Start Time 0815    PT Stop Time [id number]    PT Time Calculation (min) 40 min    Activity Tolerance Patient tolerated treatment well    Behavior During Therapy [facility] for tasks assessed/performed            Past Medical History:  Diagnosis Date   Anxiety    on meds   Aortic atherosclerosis ([place])    Arthritis    generalized   Bilateral inguinal hernia 01/10/2021   Cancer ([place])    skin cancers removed   Diverticulosis 01/10/2021   GERD (gastroesophageal reflux disease)    on meds   Hyperlipidemia    on meds   Hypertension    on meds   Overweight(278.02)    obesity   Permanent atrial fibrillation ([facility])    Seasonal allergies    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 Tawanna Solo TUNNEL RELEASE Left 2022   [place]  03/28/2003   St. Jude-PPM   PPM GENERATOR CHANGEOUT N/A 11/24/2018   Procedure: PPM GENERATOR CHANGEOUT;  Surgeon: [name], MD;  Location: [place] [facility];  Service: Cardiovascular;  Laterality: N/A;   Patient Active Problem List   Diagnosis Date Noted   PVD (peripheral vascular disease) ([place]) 04/30/2022   Panic attacks 04/30/2022   Lumbar spondylosis 03/10/2022   Bilateral inguinal hernia 01/10/2021   Aortic atherosclerosis ([place]) 06/24/2020   Carpal tunnel syndrome, left upper limb 05/15/2020   Primary insomnia 05/19/2019   Controlled substance agreement signed 05/19/2019   Elevated liver enzymes 06/08/2017   Pacemaker 06/04/2017   Gastroesophageal reflux disease 02/05/2016   Essential hypertension with goal blood  pressure less than 130/80 09/18/2013   ATRIAL FIBRILLATION 01/02/2010   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   REFERRING PROVIDER: Claretta Fraise, MD   REFERRING DIAG: Acute pain of right shoulder   THERAPY DIAG:  Chronic right shoulder pain  Stiffness of right shoulder, not elsewhere classified  Muscle weakness (generalized)  Rationale for Evaluation and Treatment: Rehabilitation  ONSET DATE: 3 months ago  SUBJECTIVE:  SUBJECTIVE STATEMENT: Patient reports that he feels much better, but his shoulder still hurts some. He feels that he is over halfway back to his prior level of function.  He has noticed that his shoulder feels better throughout the day as he uses it.   PERTINENT HISTORY: Atrial fibrillation, HTN, low back pain, anxiety, OA, history of cancer, and presence of a pacemaker  PAIN:  Are you having pain? Yes: NPRS scale: 4/10 Pain location: bilateral shoulders down to mid humerus Pain description: sharp Aggravating factors: moving his arms Relieving factors: rest  PRECAUTIONS: ICD/Pacemaker  PATIENT GOALS:reduced pain, improved strength, and return to playing golf  NEXT MD VISIT: 04/30/21  OBJECTIVE:   PATIENT SURVEYS:  FOTO 48% limitation  POSTURE: Forward head, rounded shoulders, and increased thoracic kyphosis  UPPER EXTREMITY ROM:   Active ROM Right eval Right 05/19/22 Left eval  Shoulder flexion 111; painful  135 124; painful   Shoulder extension     Shoulder abduction 105; painful 135 108; painful   Shoulder adduction     Shoulder internal rotation To L5; painful  To L3; painful   Shoulder external rotation To T2; "tight and pain"  To T3; painful   Elbow flexion     Elbow extension     Wrist flexion     Wrist extension     Wrist ulnar deviation     Wrist  radial deviation     Wrist pronation     Wrist supination     (Blank rows = not tested)  UPPER EXTREMITY MMT:  MMT Right eval Right 05/19/22 Left eval  Shoulder flexion 4/5; painful 4/5; slight pain 4/5; painful   Shoulder extension     Shoulder abduction 3+/5; painful 4-/5; slight pain  3+/5; painful   Shoulder adduction     Shoulder internal rotation 4-/5; painful 4-/5; slight pain  4/5; painful  Shoulder external rotation 4-/5; painful 4-/5; slight pain  4/5; painful   Middle trapezius     Lower trapezius     Elbow flexion     Elbow extension     Wrist flexion     Wrist extension     Wrist ulnar deviation     Wrist radial deviation     Wrist pronation     Wrist supination     Grip strength (lbs)     (Blank rows = not tested)  JOINT MOBILITY TESTING:  Glenohumeral: hypomobile and painful bilaterally  PALPATION:  TTP: bilateral latissimus dorsi, left infraspinatus, right upper trapezius, and supraspinatus   TODAY'S TREATMENT:                                                                                                                                         DATE:  3/12 EXERCISE LOG  Exercise Repetitions and Resistance Comments  UBE X10 minutes @ 90 RPM   Resisted IR  Green t-band x 30 reps    Resisted ER  Green t-band x 30 reps    Doorway stretch  3 x 30 seconds   Resisted pull down  Blue XTS x 30 reps   PNF D1 & D2  Blue XTS x 20 reps each    Resisted punch out  Green t-band x 30 reps    Resisted ABD Green t-band x 30 reps    Therabar bending  Red t-bar x 20 reps each    Resisted ADD  Blue XTS x 30 reps    Functional IR stretch  3 x 30 seconds    Blank cell = exercise not performed today                                    3/6 EXERCISE LOG  Exercise Repetitions and Resistance Comments  UBE X10 minutes @ 90 RPM   Resisted pull down Blue XTS x  2 minutes   Functional IR stretch 4 x 30 seconds    R shoulder IR  Green t-band x 30 reps    R shoulder ER  Green t-band x 20 reps    Wall slide (abduction) 2 minutes   Horizontal ABD Red t-band x 20 reps     Blank cell = exercise not performed today  Modalities  Date:  Unattended Estim: Shoulder, pre mod @ 80-150 Hz , 15 mins, Pain Hot Pack: Shoulder, 15 mins, Pain                                   3/4 EXERCISE LOG  Exercise Repetitions and Resistance Comments  UBE  X10 minutes @ 90 RPM   Wall ladder 7 reps each  Max 24#   Resisted row  Green t-band x 20 reps    Therabar bending Red t-bar x 3 minutes   Resisted ADD  Blue t-band x 2 minutes   Resisted R shoulder IR  Red t-band x 2 minutes    Ball roll out  20 reps  For shoulder flexion   Blank cell = exercise not performed today  Manual Therapy Soft Tissue Mobilization: right supraspinatus, upper trapezius, infraspinatus, and deltoid, for reduced pain and tone   Modalities  Date:  Unattended Estim: right supraspinatus, pre mod @ 80-150 Hz, 15 mins, Pain and Tone Hot Pack: Shoulder, 15 mins, Pain and Tone  PATIENT EDUCATION: Education details: HEP, healing, prognosis, POC, and goals for therapy Person educated: Patient Education method: Explanation Education comprehension: verbalized understanding  HOME EXERCISE PROGRAM: Today's interventions were provided as his HEP. He was recommended to complete these interventions twice per day.   ASSESSMENT:  CLINICAL IMPRESSION: Patient is making good progress with skilled physical therapy as evidenced by his subjective reports, objective measures, functional mobility, and progress toward his goals. He was able to demonstrate a significant improvement in his right shoulder active range of motion compared to his initial evaluation. He was progressed with familiar interventions for improved rotator cuff strengthening. He required minimal cueing with resisted internal and external  rotation He reported no significant increase in pain or discomfort with any of today's interventions. Recommend that he continue skilled physical therapy for an additional four visits to maximize his  functional mobility.    OBJECTIVE IMPAIRMENTS: decreased ROM, decreased strength, hypomobility, impaired tone, postural dysfunction, and pain.   ACTIVITY LIMITATIONS: lifting and reach over head  PARTICIPATION LIMITATIONS: cleaning, community activity, and yard work  PERSONAL FACTORS: Age, Time since onset of injury/illness/exacerbation, and 3+ comorbidities: Atrial fibrillation, HTN, low back pain, anxiety, OA, history of cancer, and presence of a pacemaker  are also affecting patient's functional outcome.   REHAB POTENTIAL: Good  CLINICAL DECISION MAKING: Evolving/moderate complexity  EVALUATION COMPLEXITY: Moderate  GOALS: Goals reviewed with patient? Yes  LONG TERM GOALS: Target date: 05/21/22  Patient will be independent with his HEP.  Baseline:  Goal status: MET  2.  Patient will be able return to golf without his familiar pain exceeding 3/10. Baseline:  Goal status: IN PROGRESS  3.  Patient will be able to demonstrate at least 125 degrees of active right shoulder flexion for improved function with overhead activities. Baseline: 135 degrees on 05/19/22 Goal status: MET  4.  Patient will be able to demonstrate at least 4/5 right shoulder strength in all planes without being limited by his familiar pain.  Baseline: see UE MMT  Goal status: IN PROGRESS  PLAN:  PT FREQUENCY: 2x/week  PT DURATION: 4 weeks  PLANNED INTERVENTIONS: Therapeutic exercises, Therapeutic activity, Neuromuscular re-education, Patient/Family education, Self Care, Joint mobilization, Cryotherapy, Moist heat, Vasopneumatic device, Manual therapy, and Re-evaluation  PLAN FOR NEXT SESSION: pulleys, isometrics, rotator cuff strengthening, and modalities as needed  Darlin Coco, PT 05/19/2022, 9:08 AM

## 2022-05-21 ENCOUNTER — Ambulatory Visit: Payer: Medicare HMO | Admitting: Physical Therapy

## 2022-05-26 ENCOUNTER — Ambulatory Visit: Payer: Medicare HMO | Admitting: *Deleted

## 2022-05-26 ENCOUNTER — Encounter: Payer: Self-pay | Admitting: *Deleted

## 2022-05-26 DIAGNOSIS — M25511 Pain in right shoulder: Secondary | ICD-10-CM | POA: Diagnosis not present

## 2022-05-26 DIAGNOSIS — M6281 Muscle weakness (generalized): Secondary | ICD-10-CM

## 2022-05-26 DIAGNOSIS — G8929 Other chronic pain: Secondary | ICD-10-CM

## 2022-05-26 DIAGNOSIS — M25611 Stiffness of right shoulder, not elsewhere classified: Secondary | ICD-10-CM | POA: Diagnosis not present

## 2022-05-26 NOTE — Therapy (Signed)
OUTPATIENT PHYSICAL THERAPY SHOULDER TREATMENT   Patient Name: Peter Howe MRN: [id number] DOB:[birth year], 82 y.o., male Today's Date: 05/26/2022  END OF SESSION:  PT End of Session - 05/26/22 0825     Visit Number 9    Number of Visits 12    Date for PT Re-Evaluation 07/03/22    PT Start Time 0815    PT Stop Time 0900    PT Time Calculation (min) 45 min            Past Medical History:  Diagnosis Date   Anxiety    on meds   Aortic atherosclerosis ([place])    Arthritis    generalized   Bilateral inguinal hernia 01/10/2021   Cancer ([place])    skin cancers removed   Diverticulosis 01/10/2021   GERD (gastroesophageal reflux disease)    on meds   Hyperlipidemia    on meds   Hypertension    on meds   Overweight(278.02)    obesity   Permanent atrial fibrillation ([facility])    Seasonal allergies    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 Tawanna Solo TUNNEL RELEASE Left 2022   [place]  03/28/2003   St. Jude-PPM   PPM GENERATOR CHANGEOUT N/A 11/24/2018   Procedure: PPM GENERATOR CHANGEOUT;  Surgeon: [name], MD;  Location: [place] [facility];  Service: Cardiovascular;  Laterality: N/A;   Patient Active Problem List   Diagnosis Date Noted   PVD (peripheral vascular disease) ([place]) 04/30/2022   Panic attacks 04/30/2022   Lumbar spondylosis 03/10/2022   Bilateral inguinal hernia 01/10/2021   Aortic atherosclerosis ([place]) 06/24/2020   Carpal tunnel syndrome, left upper limb 05/15/2020   Primary insomnia 05/19/2019   Controlled substance agreement signed 05/19/2019   Elevated liver enzymes 06/08/2017   Pacemaker 06/04/2017   Gastroesophageal reflux disease 02/05/2016   Essential hypertension with goal blood pressure less than 130/80 09/18/2013   ATRIAL FIBRILLATION 01/02/2010   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    REFERRING PROVIDER: Claretta Fraise, MD   REFERRING DIAG: Acute pain of right shoulder   THERAPY DIAG:  Chronic right shoulder pain  Stiffness of right shoulder, not elsewhere classified  Muscle weakness (generalized)  Rationale for Evaluation and Treatment: Rehabilitation  ONSET DATE: 3 months ago  SUBJECTIVE:  SUBJECTIVE STATEMENT:  Pt reports doing fairly well with decreased pain , but still with pain at certain times PERTINENT HISTORY: Atrial fibrillation, HTN, low back pain, anxiety, OA, history of cancer, and presence of a pacemaker  PAIN:  Are you having pain? Yes: NPRS scale: 2-3/10 Pain location: bilateral shoulders down to mid humerus Pain description: sharp Aggravating factors: moving his arms Relieving factors: rest  PRECAUTIONS: ICD/Pacemaker  PATIENT GOALS:reduced pain, improved strength, and return to playing golf  NEXT MD VISIT: 04/30/21  OBJECTIVE:   PATIENT SURVEYS:  FOTO 48% limitation  POSTURE: Forward head, rounded shoulders, and increased thoracic kyphosis  UPPER EXTREMITY ROM:   Active ROM Right eval Right 05/19/22 Left eval  Shoulder flexion 111; painful  135 124; painful   Shoulder extension     Shoulder abduction 105; painful 135 108; painful   Shoulder adduction     Shoulder internal rotation To L5; painful  To L3; painful   Shoulder external rotation To T2; "tight and pain"  To T3; painful   Elbow flexion     Elbow extension     Wrist flexion     Wrist extension     Wrist ulnar deviation     Wrist radial deviation     Wrist pronation     Wrist supination     (Blank rows = not tested)  UPPER EXTREMITY MMT:  MMT Right eval Right 05/19/22 Left eval  Shoulder flexion 4/5; painful 4/5; slight pain 4/5; painful   Shoulder extension     Shoulder  abduction 3+/5; painful 4-/5; slight pain  3+/5; painful   Shoulder adduction     Shoulder internal rotation 4-/5; painful 4-/5; slight pain  4/5; painful  Shoulder external rotation 4-/5; painful 4-/5; slight pain  4/5; painful   Middle trapezius     Lower trapezius     Elbow flexion     Elbow extension     Wrist flexion     Wrist extension     Wrist ulnar deviation     Wrist radial deviation     Wrist pronation     Wrist supination     Grip strength (lbs)     (Blank rows = not tested)  JOINT MOBILITY TESTING:  Glenohumeral: hypomobile and painful bilaterally  PALPATION:  TTP: bilateral latissimus dorsi, left infraspinatus, right upper trapezius, and supraspinatus   TODAY'S TREATMENT:                                                                                                                                         DATE:  3/19      EXERCISE LOG  Exercise Repetitions and Resistance Comments  UBE X8 minutes @ 90 RPM   Resisted IR  Green t-band x 30 reps    Resisted ER   RED  t-band x 30 reps    Doorway stretch     Resisted pull down  Blue XTS x 30 reps   PNF D1 & D2      Resisted punch out  Green t-band x 30 reps    Resisted ABD Green t-band x 30 reps    Therabar bending     Resisted ADD     Functional IR stretch      Blank cell = exercise not performed today  Manual PROM for IR and ER and STW to RT UT with TPR                                   3/6 EXERCISE LOG  Exercise Repetitions and Resistance Comments  UBE X10 minutes @ 90 RPM   Resisted pull down Blue XTS x 2 minutes   Functional IR stretch 4 x 30 seconds    R shoulder IR  Green t-band x 30 reps    R shoulder ER  Green t-band x 20 reps    Wall slide (abduction) 2 minutes   Horizontal ABD Red t-band x 20 reps     Blank cell = exercise not performed today  Modalities  Date:  Unattended  Estim: Shoulder, pre mod @ 80-150 Hz , 15 mins, Pain Hot Pack: Shoulder, 15 mins, Pain                                   3/4 EXERCISE LOG  Exercise Repetitions and Resistance Comments  UBE  X10 minutes @ 90 RPM   Wall ladder 7 reps each  Max 24#   Resisted row  Green t-band x 20 reps    Therabar bending Red t-bar x 3 minutes   Resisted ADD  Blue t-band x 2 minutes   Resisted R shoulder IR  Red t-band x 2 minutes    Ball roll out  20 reps  For shoulder flexion   Blank cell = exercise not performed today  Manual Therapy Soft Tissue Mobilization: right supraspinatus, upper trapezius, infraspinatus, and deltoid, for reduced pain and tone   Modalities  Date:  Unattended Estim: right supraspinatus, pre mod @ 80-150 Hz, 15 mins, Pain and Tone Hot Pack: Shoulder, 15 mins, Pain and Tone  PATIENT EDUCATION: Education details: HEP, healing, prognosis, POC, and goals for therapy Person educated: Patient Education method: Explanation Education comprehension: verbalized understanding  HOME EXERCISE PROGRAM: Today's interventions were provided as his HEP. He was recommended to complete these interventions twice per day.   ASSESSMENT:  CLINICAL IMPRESSION: Pt arrived today still doing fairly well and was able to continue with ex progression for RT shldr/ Extremity with AAROM as well as PREs. Manual PROM stretching performed for IR and ER as well as STW and TPR to RT UT      OBJECTIVE IMPAIRMENTS: decreased ROM, decreased strength, hypomobility, impaired tone, postural dysfunction, and pain.   ACTIVITY LIMITATIONS: lifting and reach over head  PARTICIPATION LIMITATIONS: cleaning, community activity, and yard work  PERSONAL FACTORS: Age, Time since onset of injury/illness/exacerbation, and 3+ comorbidities: Atrial fibrillation, HTN, low back pain, anxiety, OA,  history of cancer, and presence of a pacemaker  are also affecting patient's functional outcome.   REHAB POTENTIAL:  Good  CLINICAL DECISION MAKING: Evolving/moderate complexity  EVALUATION COMPLEXITY: Moderate  GOALS: Goals reviewed with patient? Yes  LONG TERM GOALS: Target date: 05/21/22  Patient will be independent with his HEP.  Baseline:  Goal status: MET  2.  Patient will be able return to golf without his familiar pain exceeding 3/10. Baseline:  Goal status: IN PROGRESS  3.  Patient will be able to demonstrate at least 125 degrees of active right shoulder flexion for improved function with overhead activities. Baseline: 135 degrees on 05/19/22 Goal status: MET  4.  Patient will be able to demonstrate at least 4/5 right shoulder strength in all planes without being limited by his familiar pain.  Baseline: see UE MMT  Goal status: IN PROGRESS  PLAN:  PT FREQUENCY: 2x/week  PT DURATION: 4 weeks  PLANNED INTERVENTIONS: Therapeutic exercises, Therapeutic activity, Neuromuscular re-education, Patient/Family education, Self Care, Joint mobilization, Cryotherapy, Moist heat, Vasopneumatic device, Manual therapy, and Re-evaluation  PLAN FOR NEXT SESSION: pulleys, isometrics, rotator cuff strengthening, and modalities as needed  Delita Chiquito,CHRIS, PTA 05/26/2022, 9:11 AM

## 2022-05-29 ENCOUNTER — Ambulatory Visit: Payer: Medicare HMO | Admitting: *Deleted

## 2022-06-02 ENCOUNTER — Ambulatory Visit: Payer: Medicare HMO | Admitting: Physical Therapy

## 2022-06-02 ENCOUNTER — Encounter: Payer: Self-pay | Admitting: Physical Therapy

## 2022-06-02 DIAGNOSIS — M6281 Muscle weakness (generalized): Secondary | ICD-10-CM | POA: Diagnosis not present

## 2022-06-02 DIAGNOSIS — M25511 Pain in right shoulder: Secondary | ICD-10-CM | POA: Diagnosis not present

## 2022-06-02 DIAGNOSIS — G8929 Other chronic pain: Secondary | ICD-10-CM

## 2022-06-02 DIAGNOSIS — M25611 Stiffness of right shoulder, not elsewhere classified: Secondary | ICD-10-CM | POA: Diagnosis not present

## 2022-06-02 NOTE — Therapy (Addendum)
OUTPATIENT PHYSICAL THERAPY SHOULDER TREATMENT   Patient Name: Peter Howe MRN: [id number] DOB:1940/03/24, 82 y.o., male Today's Date: 06/02/2022  END OF SESSION:  PT End of Session - 06/02/22 0816     Visit Number 10    Number of Visits 12    Date for PT Re-Evaluation 07/03/22    PT Start Time 0816    PT Stop Time 0856    PT Time Calculation (min) 40 min    Activity Tolerance Patient tolerated treatment well    Behavior During Therapy [facility] for tasks assessed/performed            Past Medical History:  Diagnosis Date   Anxiety    on meds   Aortic atherosclerosis ([place])    Arthritis    generalized   Bilateral inguinal hernia 01/10/2021   Cancer ([place])    skin cancers removed   Diverticulosis 01/10/2021   GERD (gastroesophageal reflux disease)    on meds   Hyperlipidemia    on meds   Hypertension    on meds   Overweight(278.02)    obesity   Permanent atrial fibrillation ([facility])    Seasonal allergies    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 Tawanna Solo TUNNEL RELEASE Left 2022   [place]  03/28/2003   St. Jude-PPM   PPM GENERATOR CHANGEOUT N/A 11/24/2018   Procedure: PPM GENERATOR CHANGEOUT;  Surgeon: [name], MD;  Location: [place] [facility];  Service: Cardiovascular;  Laterality: N/A;   Patient Active Problem List   Diagnosis Date Noted   PVD (peripheral vascular disease) ([place]) 04/30/2022   Panic attacks 04/30/2022   Lumbar spondylosis 03/10/2022   Bilateral inguinal hernia 01/10/2021   Aortic atherosclerosis ([place]) 06/24/2020   Carpal tunnel syndrome, left upper limb 05/15/2020   Primary insomnia 05/19/2019   Controlled substance agreement signed 05/19/2019   Elevated liver enzymes 06/08/2017   Pacemaker 06/04/2017   Gastroesophageal reflux disease 02/05/2016   Essential hypertension with goal blood  pressure less than 130/80 09/18/2013   ATRIAL FIBRILLATION 01/02/2010   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   REFERRING PROVIDER: Claretta Fraise, MD   REFERRING DIAG: Acute pain of right shoulder   THERAPY DIAG:  Chronic right shoulder pain  Stiffness of right shoulder, not elsewhere classified  Muscle weakness (generalized)  Rationale for Evaluation and Treatment: Rehabilitation  ONSET DATE: 3 months ago  SUBJECTIVE:  SUBJECTIVE STATEMENT:  Reports still having pain but bearable. Had a difficult morning in both shoulders and tightness. Doesn't have the strength for a good swing in golf.  PERTINENT HISTORY: Atrial fibrillation, HTN, low back pain, anxiety, OA, history of cancer, and presence of a pacemaker  PAIN:  Are you having pain? Yes: NPRS scale: 3-4/10 Pain location: bilateral shoulders down to mid humerus Pain description: sharp Aggravating factors: moving his arms Relieving factors: rest  PRECAUTIONS: ICD/Pacemaker  PATIENT GOALS:reduced pain, improved strength, and return to playing golf  NEXT MD VISIT: 07/2022  OBJECTIVE:   PATIENT SURVEYS:  FOTO 48% limitation  POSTURE: Forward head, rounded shoulders, and increased thoracic kyphosis  UPPER EXTREMITY ROM:   Active ROM Right eval Right 05/19/22 Left eval  Shoulder flexion 111; painful  135 124; painful   Shoulder extension     Shoulder abduction 105; painful 135 108; painful   Shoulder adduction     Shoulder internal rotation To L5; painful  To L3; painful   Shoulder external rotation To T2; "tight and pain"  To T3; painful   Elbow flexion     Elbow extension     Wrist flexion     Wrist extension     Wrist ulnar deviation     Wrist radial deviation     Wrist pronation     Wrist supination     (Blank rows =  not tested)  UPPER EXTREMITY MMT:  MMT Right eval Right 05/19/22 Left eval  Shoulder flexion 4/5; painful 4/5; slight pain 4/5; painful   Shoulder extension     Shoulder abduction 3+/5; painful 4-/5; slight pain  3+/5; painful   Shoulder adduction     Shoulder internal rotation 4-/5; painful 4-/5; slight pain  4/5; painful  Shoulder external rotation 4-/5; painful 4-/5; slight pain  4/5; painful   Middle trapezius     Lower trapezius     Elbow flexion     Elbow extension     Wrist flexion     Wrist extension     Wrist ulnar deviation     Wrist radial deviation     Wrist pronation     Wrist supination     Grip strength (lbs)     (Blank rows = not tested)  JOINT MOBILITY TESTING:  Glenohumeral: hypomobile and painful bilaterally  PALPATION:  TTP: bilateral latissimus dorsi, left infraspinatus, right upper trapezius, and supraspinatus   TODAY'S TREATMENT:                                                                                                                                         DATE:                     3/26     EXERCISE LOG  Exercise Repetitions and Resistance Comments  Shoulder extension Blue X20 reps   B shoulder D2  Blue XTS x20 reps   Resisted ER Green theraband x20 reps   Resisted IR Green theraband x20 reps   Resisted protraction Green theraband x20 reps   UBE 90 RPM x10 min    Blank cell = exercise not performed today   Manual Therapy Soft Tissue Mobilization: B UT, cervical paraspinals, to reduce tightness and pain                                     3/19      EXERCISE LOG  Exercise Repetitions and Resistance Comments  UBE X8 minutes @ 90 RPM   Resisted IR  Green t-band x 30 reps    Resisted ER   RED  t-band x 30 reps    Doorway stretch     Resisted pull down  Blue XTS x 30 reps   PNF D1 & D2      Resisted punch out  Green t-band x 30 reps    Resisted ABD Green t-band x 30 reps    Therabar bending     Resisted ADD     Functional IR stretch       Blank cell = exercise not performed today  Manual PROM for IR and ER and STW to RT UT with TPR  PATIENT EDUCATION: Education details: HEP, healing, prognosis, POC, and goals for therapy Person educated: Patient Education method: Explanation Education comprehension: verbalized understanding  HOME EXERCISE PROGRAM: Today's interventions were provided as his HEP. He was recommended to complete these interventions twice per day.   ASSESSMENT:  CLINICAL IMPRESSION:   Patient presented in clinic with reports of mild shoulder pain. Patient has only played golf a handful of times and does not have the functional strength in his swing that he used to have. Patient able to tolerate therex well with no complaints of pain. Patient presented in clinic with B UT with patient reporting relief of tone with manual therapy.  06/02/22 PROGRESS REPORT:  Patient is making fair progress with skilled physical therapy as evidenced by his subjective reports, objective measures, functional mobility, and progress toward his goals. He was able to demonstrate improved shoulder mobility as he was able to meet his goal for improved active shoulder flexion. However, he continues to exhibit reduced right upper extremity strength and increased pain with functional activities such as golf. Recommend that he continues with physical therapy to address his remaining impairments to maximize his functional mobility.   Jacqulynn Cadet, PT, DPT   OBJECTIVE IMPAIRMENTS: decreased ROM, decreased strength, hypomobility, impaired tone, postural dysfunction, and pain.   ACTIVITY LIMITATIONS: lifting and reach over head  PARTICIPATION LIMITATIONS: cleaning, community activity, and yard work  PERSONAL FACTORS: Age, Time since onset of injury/illness/exacerbation, and 3+ comorbidities: Atrial fibrillation, HTN, low back pain, anxiety, OA, history of cancer, and presence of a pacemaker  are also affecting patient's functional outcome.    REHAB POTENTIAL: Good  CLINICAL DECISION MAKING: Evolving/moderate complexity  EVALUATION COMPLEXITY: Moderate  GOALS: Goals reviewed with patient? Yes  LONG TERM GOALS: Target date: 05/21/22  Patient will be independent with his HEP.  Baseline:  Goal status: MET  2.  Patient will be able return to golf without his familiar pain exceeding 3/10. Baseline:  Goal status: IN PROGRESS  3.  Patient will be able to demonstrate at least 125 degrees of active right shoulder flexion for improved function with overhead activities. Baseline: 135 degrees  on 05/19/22 Goal status: MET  4.  Patient will be able to demonstrate at least 4/5 right shoulder strength in all planes without being limited by his familiar pain.  Baseline: see UE MMT  Goal status: IN PROGRESS  PLAN:  PT FREQUENCY: 2x/week  PT DURATION: 4 weeks  PLANNED INTERVENTIONS: Therapeutic exercises, Therapeutic activity, Neuromuscular re-education, Patient/Family education, Self Care, Joint mobilization, Cryotherapy, Moist heat, Vasopneumatic device, Manual therapy, and Re-evaluation  PLAN FOR NEXT SESSION: pulleys, isometrics, rotator cuff strengthening, and modalities as needed  Standley Brooking, PTA 06/02/2022, 9:23 AM

## 2022-06-08 ENCOUNTER — Encounter: Payer: Self-pay | Admitting: Physical Therapy

## 2022-06-08 ENCOUNTER — Ambulatory Visit: Payer: Medicare HMO | Attending: Family Medicine | Admitting: Physical Therapy

## 2022-06-08 DIAGNOSIS — M6281 Muscle weakness (generalized): Secondary | ICD-10-CM | POA: Diagnosis not present

## 2022-06-08 DIAGNOSIS — M25611 Stiffness of right shoulder, not elsewhere classified: Secondary | ICD-10-CM | POA: Diagnosis not present

## 2022-06-08 DIAGNOSIS — G8929 Other chronic pain: Secondary | ICD-10-CM | POA: Insufficient documentation

## 2022-06-08 DIAGNOSIS — M25511 Pain in right shoulder: Secondary | ICD-10-CM | POA: Insufficient documentation

## 2022-06-08 NOTE — Therapy (Signed)
OUTPATIENT PHYSICAL THERAPY SHOULDER TREATMENT   Patient Name: Peter Howe MRN: [id number] DOB:[birth year], 82 y.o., male Today's Date: 06/08/2022  END OF SESSION:  PT End of Session - 06/08/22 0819     Visit Number 11    Number of Visits 12    Date for PT Re-Evaluation 07/03/22    PT Start Time 0816    PT Stop Time 0858    PT Time Calculation (min) 42 min    Activity Tolerance Patient tolerated treatment well    Behavior During Therapy [facility] for tasks assessed/performed            Past Medical History:  Diagnosis Date   Anxiety    on meds   Aortic atherosclerosis    Arthritis    generalized   Bilateral inguinal hernia 01/10/2021   Cancer    skin cancers removed   Diverticulosis 01/10/2021   GERD (gastroesophageal reflux disease)    on meds   Hyperlipidemia    on meds   Hypertension    on meds   Overweight(278.02)    obesity   Permanent atrial fibrillation    Seasonal allergies    Tachycardia-bradycardia    s/p PPM   Past Surgical History:  Procedure Laterality Date   ARM SKIN LESION BIOPSY / EXCISION Left    Melanoma -removed by Dr. Tawanna Solo TUNNEL RELEASE Left 2022   PACEMAKER INSERTION  03/28/2003   St. Jude-PPM   PPM GENERATOR CHANGEOUT N/A 11/24/2018   Procedure: PPM GENERATOR CHANGEOUT;  Surgeon: [name], MD;  Location: [place] [facility];  Service: Cardiovascular;  Laterality: N/A;   Patient Active Problem List   Diagnosis Date Noted   PVD (peripheral vascular disease) 04/30/2022   Panic attacks 04/30/2022   Lumbar spondylosis 03/10/2022   Bilateral inguinal hernia 01/10/2021   Aortic atherosclerosis 06/24/2020   Carpal tunnel syndrome, left upper limb 05/15/2020   Primary insomnia 05/19/2019   Controlled substance agreement signed 05/19/2019   Elevated liver enzymes 06/08/2017   Pacemaker 06/04/2017   Gastroesophageal reflux disease 02/05/2016   Essential hypertension with goal blood pressure less than 130/80 09/18/2013    ATRIAL FIBRILLATION 01/02/2010   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   REFERRING PROVIDER: Claretta Fraise, MD   REFERRING DIAG: Acute pain of right shoulder   THERAPY DIAG:  Chronic right shoulder pain  Stiffness of right shoulder, not elsewhere classified  Muscle weakness (generalized)  Rationale for Evaluation and Treatment: Rehabilitation  ONSET DATE: 3 months ago  SUBJECTIVE:  SUBJECTIVE STATEMENT:  Reports mild, intermittent discomfort with raising his arms or golf.   PERTINENT HISTORY: Atrial fibrillation, HTN, low back pain, anxiety, OA, history of cancer, and presence of a pacemaker  PAIN:  Are you having pain? Yes: NPRS scale: 4/10 Pain location: b shoulder Pain description: Intermittant, sharp Aggravating factors: moving his arms Relieving factors: rest  PRECAUTIONS: ICD/Pacemaker  PATIENT GOALS:reduced pain, improved strength, and return to playing golf  NEXT MD VISIT: 07/2022  OBJECTIVE:   PATIENT SURVEYS:  FOTO 68% limitation  POSTURE: Forward head, rounded shoulders, and increased thoracic kyphosis  UPPER EXTREMITY ROM:   Active ROM Right eval Right 05/19/22 Left eval  Shoulder flexion 111; painful  135 124; painful   Shoulder extension     Shoulder abduction 105; painful 135 108; painful   Shoulder adduction     Shoulder internal rotation To L5; painful  To L3; painful   Shoulder external rotation To T2; "tight and pain"  To T3; painful   Elbow flexion     Elbow extension     Wrist flexion     Wrist extension     Wrist ulnar deviation     Wrist radial deviation     Wrist pronation     Wrist supination     (Blank rows = not tested)  UPPER EXTREMITY MMT:  MMT Right eval Right 05/19/22 Left eval  Shoulder flexion 4/5; painful 4/5; slight pain 4/5;  painful   Shoulder extension     Shoulder abduction 3+/5; painful 4-/5; slight pain  3+/5; painful   Shoulder adduction     Shoulder internal rotation 4-/5; painful 4-/5; slight pain  4/5; painful  Shoulder external rotation 4-/5; painful 4-/5; slight pain  4/5; painful   Middle trapezius     Lower trapezius     Elbow flexion     Elbow extension     Wrist flexion     Wrist extension     Wrist ulnar deviation     Wrist radial deviation     Wrist pronation     Wrist supination     Grip strength (lbs)     (Blank rows = not tested)  JOINT MOBILITY TESTING:  Glenohumeral: hypomobile and painful bilaterally  PALPATION:  TTP: bilateral latissimus dorsi, left infraspinatus, right upper trapezius, and supraspinatus   TODAY'S TREATMENT:                                                                                                                                         DATE:                     4/1     EXERCISE LOG  Exercise Repetitions and Resistance Comments  Shoulder extension Blue X20 reps   B shoulder D1 Blue XTS x20 reps   Resisted ER Red theraband x20 reps   Resisted IR Red  theraband x20 reps   Resisted protraction Red theraband x20 reps   Resisted row Red theraband x20 reps   Resisted extension Red theraband x20 reps   Wall pushups X20 reps   Horizontal abduction Red theraband x20 reps   Shoulder D2 Yellow theraband x20 reps   UBE 60 RPM x8 min   Pulleys X5 min    Blank cell = exercise not performed today   Manual Therapy Soft Tissue Mobilization: B UT, cervical paraspinals, to reduce tightness and pain    PATIENT EDUCATION: Education details: HEP, healing, prognosis, POC, and goals for therapy Person educated: Patient Education method: Explanation Education comprehension: verbalized understanding  HOME EXERCISE PROGRAM: Today's interventions were provided as his HEP. He was recommended to complete these interventions twice per day.   ASSESSMENT:  CLINICAL  IMPRESSION:   Patient presented in clinic with reports of mild but intermittent pain of B shoulders with ROM and with golf as well. Patient able to tolerate all therex fairly well although progressed to more postural and golf related strengthening. Mild TP and muscle tightness noted in B UT and levator scapula. No complaints following end of treatment.  OBJECTIVE IMPAIRMENTS: decreased ROM, decreased strength, hypomobility, impaired tone, postural dysfunction, and pain.   ACTIVITY LIMITATIONS: lifting and reach over head  PARTICIPATION LIMITATIONS: cleaning, community activity, and yard work  PERSONAL FACTORS: Age, Time since onset of injury/illness/exacerbation, and 3+ comorbidities: Atrial fibrillation, HTN, low back pain, anxiety, OA, history of cancer, and presence of a pacemaker  are also affecting patient's functional outcome.   REHAB POTENTIAL: Good  CLINICAL DECISION MAKING: Evolving/moderate complexity  EVALUATION COMPLEXITY: Moderate  GOALS: Goals reviewed with patient? Yes  LONG TERM GOALS: Target date: 05/21/22  Patient will be independent with his HEP.  Baseline:  Goal status: MET  2.  Patient will be able return to golf without his familiar pain exceeding 3/10. Baseline:  Goal status: IN PROGRESS  3.  Patient will be able to demonstrate at least 125 degrees of active right shoulder flexion for improved function with overhead activities. Baseline: 135 degrees on 05/19/22 Goal status: MET  4.  Patient will be able to demonstrate at least 4/5 right shoulder strength in all planes without being limited by his familiar pain.  Baseline: see UE MMT  Goal status: IN PROGRESS  PLAN:  PT FREQUENCY: 2x/week  PT DURATION: 4 weeks  PLANNED INTERVENTIONS: Therapeutic exercises, Therapeutic activity, Neuromuscular re-education, Patient/Family education, Self Care, Joint mobilization, Cryotherapy, Moist heat, Vasopneumatic device, Manual therapy, and Re-evaluation  PLAN FOR  NEXT SESSION: Progress strengthening  Standley Brooking, PTA 06/08/2022, 9:18 AM

## 2022-06-10 ENCOUNTER — Ambulatory Visit: Payer: Medicare HMO | Admitting: Physical Therapy

## 2022-06-10 ENCOUNTER — Encounter: Payer: Self-pay | Admitting: Physical Therapy

## 2022-06-10 DIAGNOSIS — M25611 Stiffness of right shoulder, not elsewhere classified: Secondary | ICD-10-CM | POA: Diagnosis not present

## 2022-06-10 DIAGNOSIS — C44329 Squamous cell carcinoma of skin of other parts of face: Secondary | ICD-10-CM | POA: Diagnosis not present

## 2022-06-10 DIAGNOSIS — M6281 Muscle weakness (generalized): Secondary | ICD-10-CM

## 2022-06-10 DIAGNOSIS — M25511 Pain in right shoulder: Secondary | ICD-10-CM | POA: Diagnosis not present

## 2022-06-10 DIAGNOSIS — G8929 Other chronic pain: Secondary | ICD-10-CM | POA: Diagnosis not present

## 2022-06-10 NOTE — Progress Notes (Signed)
Remote pacemaker transmission.   

## 2022-06-10 NOTE — Therapy (Addendum)
OUTPATIENT PHYSICAL THERAPY SHOULDER TREATMENT   Patient Name: Peter Howe MRN: [id number] DOB:12/12/1940, 82 y.o., male Today's Date: 06/10/2022  END OF SESSION:  PT End of Session - 06/10/22 0811     Visit Number 12    Number of Visits 12    Date for PT Re-Evaluation 07/03/22    PT Start Time 0816    PT Stop Time 0858    PT Time Calculation (min) 42 min    Activity Tolerance Patient tolerated treatment well    Behavior During Therapy [facility] for tasks assessed/performed            Past Medical History:  Diagnosis Date   Anxiety    on meds   Aortic atherosclerosis    Arthritis    generalized   Bilateral inguinal hernia 01/10/2021   Cancer    skin cancers removed   Diverticulosis 01/10/2021   GERD (gastroesophageal reflux disease)    on meds   Hyperlipidemia    on meds   Hypertension    on meds   Overweight(278.02)    obesity   Permanent atrial fibrillation    Seasonal allergies    Tachycardia-bradycardia    s/p PPM   Past Surgical History:  Procedure Laterality Date   ARM SKIN LESION BIOPSY / EXCISION Left    Melanoma -removed by Dr. Tawanna Solo TUNNEL RELEASE Left 2022   PACEMAKER INSERTION  03/28/2003   [employer]-PPM   PPM GENERATOR CHANGEOUT N/A 11/24/2018   Procedure: PPM GENERATOR CHANGEOUT;  Surgeon: [name], MD;  Location: [facility];  Service: Cardiovascular;  Laterality: N/A;   Patient Active Problem List   Diagnosis Date Noted   PVD (peripheral vascular disease) 04/30/2022   Panic attacks 04/30/2022   Lumbar spondylosis 03/10/2022   Bilateral inguinal hernia 01/10/2021   Aortic atherosclerosis 06/24/2020   Carpal tunnel syndrome, left upper limb 05/15/2020   Primary insomnia 05/19/2019   Controlled substance agreement signed 05/19/2019   Elevated liver enzymes 06/08/2017   Pacemaker 06/04/2017   Gastroesophageal reflux disease 02/05/2016   Essential hypertension with goal blood pressure less than 130/80 09/18/2013    ATRIAL FIBRILLATION 01/02/2010   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   REFERRING PROVIDER: Claretta Fraise, MD   REFERRING DIAG: Acute pain of right shoulder   THERAPY DIAG:  Chronic right shoulder pain  Stiffness of right shoulder, not elsewhere classified  Muscle weakness (generalized)  Rationale for Evaluation and Treatment: Rehabilitation  ONSET DATE: 3 months ago  SUBJECTIVE:  SUBJECTIVE STATEMENT:  Has pain with raising his arms but the pain is tolerable now. Has noticed big improvement since starting PT.  PERTINENT HISTORY: Atrial fibrillation, HTN, low back pain, anxiety, OA, history of cancer, and presence of a pacemaker  PAIN:  Are you having pain? Yes: NPRS scale: no score provided/10 Pain location: b shoulder Pain description: Intermittant, sharp Aggravating factors: moving his arms Relieving factors: rest  PRECAUTIONS: ICD/Pacemaker  PATIENT GOALS:reduced pain, improved strength, and return to playing golf  NEXT MD VISIT: 07/2022  OBJECTIVE:   PATIENT SURVEYS:  FOTO 68% limitation  POSTURE: Forward head, rounded shoulders, and increased thoracic kyphosis  UPPER EXTREMITY ROM:   Active ROM Right eval Right 05/19/22 Left eval  Shoulder flexion 111; painful  135 124; painful   Shoulder extension     Shoulder abduction 105; painful 135 108; painful   Shoulder adduction     Shoulder internal rotation To L5; painful  To L3; painful   Shoulder external rotation To T2; "tight and pain"  To T3; painful   Elbow flexion     Elbow extension     Wrist flexion     Wrist extension     Wrist ulnar deviation     Wrist radial deviation     Wrist pronation     Wrist supination     (Blank rows = not tested)  UPPER EXTREMITY MMT:  MMT Right eval Right 05/19/22 Left eval  Right 06/10/22  Shoulder flexion 4/5; painful 4/5; slight pain 4/5; painful  4+/5  Shoulder extension      Shoulder abduction 3+/5; painful 4-/5; slight pain  3+/5; painful  4+/5; painful  Shoulder adduction      Shoulder internal rotation 4-/5; painful 4-/5; slight pain  4/5; painful 4+/5; min pain  Shoulder external rotation 4-/5; painful 4-/5; slight pain  4/5; painful  4/5; min pain  Middle trapezius      Lower trapezius      Elbow flexion      Elbow extension      Wrist flexion      Wrist extension      Wrist ulnar deviation      Wrist radial deviation      Wrist pronation      Wrist supination      Grip strength (lbs)      (Blank rows = not tested)  JOINT MOBILITY TESTING:  Glenohumeral: hypomobile and painful bilaterally  PALPATION:  TTP: bilateral latissimus dorsi, left infraspinatus, right upper trapezius, and supraspinatus   TODAY'S TREATMENT:                                                                                                                                         DATE:                     4/2    EXERCISE LOG  Exercise Repetitions and Resistance Comments  Shoulder extension Blue X20 reps   B shoulder D1 Blue XTS x20 reps   Resisted ER Red theraband x30 reps   Resisted IR Red theraband x30 reps   Resisted protraction Red theraband x30 reps   Resisted row Red theraband x30 reps   Resisted extension Red theraband x30 reps   Resisted adduction Red theraband x30 reps   Wall slides with ER Red theraband x20 reps fatigue  Horizontal abduction Red theraband x30 reps   Shoulder D2 Yellow theraband x20 reps   UBE 60 RPM x8 min   Pulleys X6 min    Blank cell = exercise not performed today   Manual Therapy Soft Tissue Mobilization: B UT, cervical paraspinals, to reduce tightness and pain    PATIENT EDUCATION: Education details: HEP, healing, prognosis, POC, and goals for therapy Person educated: Patient Education method: Explanation Education  comprehension: verbalized understanding  HOME EXERCISE PROGRAM: Today's interventions were provided as his HEP. He was recommended to complete these interventions twice per day.   ASSESSMENT:  CLINICAL IMPRESSION:   Patient presented in clinic with reports of improvement since starting PT but does still have pain with shoulder flexion. Patient also has increased pain with golf as well. Patient indicates that pain in B shoulders is tolerable now. Patient progressed through therex with greater reps for resistance training. Patient able to achieve most goals although limited by pain with golf. Mild tone of B levator scapula with TP noted at scapular attachment of levator scapula.   PHYSICAL THERAPY DISCHARGE SUMMARY  Visits from Start of Care: 12  Current functional level related to goals / functional outcomes: Patient was able to meet most of his goals for skilled physical therapy.    Remaining deficits: Pain with functional activities such as golf   Education / Equipment: HEP   Patient agrees to discharge. Patient goals were partially met. Patient is being discharged due to being pleased with the current functional level.  Jacqulynn Cadet, PT, DPT    OBJECTIVE IMPAIRMENTS: decreased ROM, decreased strength, hypomobility, impaired tone, postural dysfunction, and pain.   ACTIVITY LIMITATIONS: lifting and reach over head  PARTICIPATION LIMITATIONS: cleaning, community activity, and yard work  PERSONAL FACTORS: Age, Time since onset of injury/illness/exacerbation, and 3+ comorbidities: Atrial fibrillation, HTN, low back pain, anxiety, OA, history of cancer, and presence of a pacemaker  are also affecting patient's functional outcome.   REHAB POTENTIAL: Good  CLINICAL DECISION MAKING: Evolving/moderate complexity  EVALUATION COMPLEXITY: Moderate  GOALS: Goals reviewed with patient? Yes  LONG TERM GOALS: Target date: 05/21/22  Patient will be independent with his HEP.  Baseline:   Goal status: MET  2.  Patient will be able return to golf without his familiar pain exceeding 3/10. Baseline:  Goal status: NOT MET  3.  Patient will be able to demonstrate at least 125 degrees of active right shoulder flexion for improved function with overhead activities. Baseline: 135 degrees on 05/19/22 Goal status: MET  4.  Patient will be able to demonstrate at least 4/5 right shoulder strength in all planes without being limited by his familiar pain.  Baseline: see UE MMT  Goal status: MET  PLAN:  PT FREQUENCY: 2x/week  PT DURATION: 4 weeks  PLANNED INTERVENTIONS: Therapeutic exercises, Therapeutic activity, Neuromuscular re-education, Patient/Family education, Self Care, Joint mobilization, Cryotherapy, Moist heat, Vasopneumatic device, Manual therapy, and Re-evaluation  PLAN FOR NEXT SESSION: DC  Standley Brooking, PTA 06/10/2022, 9:24 AM

## 2022-06-17 ENCOUNTER — Telehealth: Payer: Self-pay | Admitting: Family Medicine

## 2022-06-17 NOTE — Telephone Encounter (Signed)
Contacted [name] to schedule their annual wellness visit. Appointment made for 06/24/2022.  Thank you,  Judeth Cornfield,  AMB Clinical Support [facility] AW[facility] Program Direct Dial ??[phone]

## 2022-06-20 DIAGNOSIS — I25119 Atherosclerotic heart disease of native coronary artery with unspecified angina pectoris: Secondary | ICD-10-CM | POA: Diagnosis not present

## 2022-06-20 DIAGNOSIS — M1990 Unspecified osteoarthritis, unspecified site: Secondary | ICD-10-CM | POA: Diagnosis not present

## 2022-06-20 DIAGNOSIS — G4700 Insomnia, unspecified: Secondary | ICD-10-CM | POA: Diagnosis not present

## 2022-06-20 DIAGNOSIS — K219 Gastro-esophageal reflux disease without esophagitis: Secondary | ICD-10-CM | POA: Diagnosis not present

## 2022-06-20 DIAGNOSIS — R69 Illness, unspecified: Secondary | ICD-10-CM | POA: Diagnosis not present

## 2022-06-20 DIAGNOSIS — I4891 Unspecified atrial fibrillation: Secondary | ICD-10-CM | POA: Diagnosis not present

## 2022-06-20 DIAGNOSIS — I739 Peripheral vascular disease, unspecified: Secondary | ICD-10-CM | POA: Diagnosis not present

## 2022-06-20 DIAGNOSIS — M5430 Sciatica, unspecified side: Secondary | ICD-10-CM | POA: Diagnosis not present

## 2022-06-20 DIAGNOSIS — N529 Male erectile dysfunction, unspecified: Secondary | ICD-10-CM | POA: Diagnosis not present

## 2022-06-20 DIAGNOSIS — E785 Hyperlipidemia, unspecified: Secondary | ICD-10-CM | POA: Diagnosis not present

## 2022-06-20 DIAGNOSIS — E669 Obesity, unspecified: Secondary | ICD-10-CM | POA: Diagnosis not present

## 2022-06-20 DIAGNOSIS — D6869 Other thrombophilia: Secondary | ICD-10-CM | POA: Diagnosis not present

## 2022-06-24 ENCOUNTER — Other Ambulatory Visit: Payer: Self-pay | Admitting: Family Medicine

## 2022-06-24 ENCOUNTER — Ambulatory Visit (INDEPENDENT_AMBULATORY_CARE_PROVIDER_SITE_OTHER): Payer: Medicare HMO

## 2022-06-24 VITALS — Ht 64.0 in | Wt 177.0 lb

## 2022-06-24 DIAGNOSIS — K219 Gastro-esophageal reflux disease without esophagitis: Secondary | ICD-10-CM

## 2022-06-24 DIAGNOSIS — Z0000 Encounter for general adult medical examination without abnormal findings: Secondary | ICD-10-CM | POA: Diagnosis not present

## 2022-06-24 NOTE — Patient Instructions (Signed)
Peter Howe , Thank you for taking time to come for your Medicare Wellness Visit. I appreciate your ongoing commitment to your health goals. Please review the following plan we discussed and let me know if I can assist you in the future.   These are the goals we discussed:  Goals      DIET - EAT MORE FRUITS AND VEGETABLES     DIET - INCREASE WATER INTAKE     Try to drink 6-8 glasses of water daily        This is a list of the screening recommended for you and due dates:  Health Maintenance  Topic Date Due   DTaP/Tdap/Td vaccine (1 - Tdap) Never done   COVID-19 Vaccine (3 - Moderna risk series) 03/20/2020   Flu Shot  10/08/2022   Colon Cancer Screening  01/21/2023   Medicare Annual Wellness Visit  06/24/2023   Pneumonia Vaccine  Completed   Zoster (Shingles) Vaccine  Completed   HPV Vaccine  Aged Out    Advanced directives: Forms are available if you choose in the future to pursue completion.  This is recommended in order to make sure that your health wishes are honored in the event that you are unable to verbalize them to the provider.    Conditions/risks identified: Aim for 30 minutes of exercise or brisk walking, 6-8 glasses of water, and 5 servings of fruits and vegetables each day.   Next appointment: Follow up in one year for your annual wellness visit.   Preventive Care 23 Years and Older, Male  Preventive care refers to lifestyle choices and visits with your health care provider that can promote health and wellness. What does preventive care include? A yearly physical exam. This is also called an annual well check. Dental exams once or twice a year. Routine eye exams. Ask your health care provider how often you should have your eyes checked. Personal lifestyle choices, including: Daily care of your teeth and gums. Regular physical activity. Eating a healthy diet. Avoiding tobacco and drug use. Limiting alcohol use. Practicing safe sex. Taking low doses of aspirin  every day. Taking vitamin and mineral supplements as recommended by your health care provider. What happens during an annual well check? The services and screenings done by your health care provider during your annual well check will depend on your age, overall health, lifestyle risk factors, and family history of disease. Counseling  Your health care provider may ask you questions about your: Alcohol use. Tobacco use. Drug use. Emotional well-being. Home and relationship well-being. Sexual activity. Eating habits. History of falls. Memory and ability to understand (cognition). Work and work Astronomer. Screening  You may have the following tests or measurements: Height, weight, and BMI. Blood pressure. Lipid and cholesterol levels. These may be checked every 5 years, or more frequently if you are over 25 years old. Skin check. Lung cancer screening. You may have this screening every year starting at age 33 if you have a 30-pack-year history of smoking and currently smoke or have quit within the past 15 years. Fecal occult blood test (FOBT) of the stool. You may have this test every year starting at age 24. Flexible sigmoidoscopy or colonoscopy. You may have a sigmoidoscopy every 5 years or a colonoscopy every 10 years starting at age 108. Prostate cancer screening. Recommendations will vary depending on your family history and other risks. Hepatitis C blood test. Hepatitis B blood test. Sexually transmitted disease (STD) testing. Diabetes screening. This is done  by checking your blood sugar (glucose) after you have not eaten for a while (fasting). You may have this done every 1-3 years. Abdominal aortic aneurysm (AAA) screening. You may need this if you are a current or former smoker. Osteoporosis. You may be screened starting at age 78 if you are at high risk. Talk with your health care provider about your test results, treatment options, and if necessary, the need for more  tests. Vaccines  Your health care provider may recommend certain vaccines, such as: Influenza vaccine. This is recommended every year. Tetanus, diphtheria, and acellular pertussis (Tdap, Td) vaccine. You may need a Td booster every 10 years. Zoster vaccine. You may need this after age 30. Pneumococcal 13-valent conjugate (PCV13) vaccine. One dose is recommended after age 21. Pneumococcal polysaccharide (PPSV23) vaccine. One dose is recommended after age 83. Talk to your health care provider about which screenings and vaccines you need and how often you need them. This information is not intended to replace advice given to you by your health care provider. Make sure you discuss any questions you have with your health care provider. Document Released: 03/22/2015 Document Revised: 11/13/2015 Document Reviewed: 12/25/2014 Elsevier Interactive Patient Education  2017 [place] Prevention in the Home Falls can cause injuries. They can happen to people of all ages. There are many things you can do to make your home safe and to help prevent falls. What can I do on the outside of my home? Regularly fix the edges of walkways and driveways and fix any cracks. Remove anything that might make you trip as you walk through a door, such as a raised step or threshold. Trim any bushes or trees on the path to your home. Use bright outdoor lighting. Clear any walking paths of anything that might make someone trip, such as rocks or tools. Regularly check to see if handrails are loose or broken. Make sure that both sides of any steps have handrails. Any raised decks and porches should have guardrails on the edges. Have any leaves, snow, or ice cleared regularly. Use sand or salt on walking paths during winter. Clean up any spills in your garage right away. This includes oil or grease spills. What can I do in the bathroom? Use night lights. Install grab bars by the toilet and in the tub and shower.  Do not use towel bars as grab bars. Use non-skid mats or decals in the tub or shower. If you need to sit down in the shower, use a plastic, non-slip stool. Keep the floor dry. Clean up any water that spills on the floor as soon as it happens. Remove soap buildup in the tub or shower regularly. Attach bath mats securely with double-sided non-slip rug tape. Do not have throw rugs and other things on the floor that can make you trip. What can I do in the bedroom? Use night lights. Make sure that you have a light by your bed that is easy to reach. Do not use any sheets or blankets that are too big for your bed. They should not hang down onto the floor. Have a firm chair that has side arms. You can use this for support while you get dressed. Do not have throw rugs and other things on the floor that can make you trip. What can I do in the kitchen? Clean up any spills right away. Avoid walking on wet floors. Keep items that you use a lot in easy-to-reach places. If you need to  reach something above you, use a strong step stool that has a grab bar. Keep electrical cords out of the way. Do not use floor polish or wax that makes floors slippery. If you must use wax, use non-skid floor wax. Do not have throw rugs and other things on the floor that can make you trip. What can I do with my stairs? Do not leave any items on the stairs. Make sure that there are handrails on both sides of the stairs and use them. Fix handrails that are broken or loose. Make sure that handrails are as long as the stairways. Check any carpeting to make sure that it is firmly attached to the stairs. Fix any carpet that is loose or worn. Avoid having throw rugs at the top or bottom of the stairs. If you do have throw rugs, attach them to the floor with carpet tape. Make sure that you have a light switch at the top of the stairs and the bottom of the stairs. If you do not have them, ask someone to add them for you. What else  can I do to help prevent falls? Wear shoes that: Do not have high heels. Have rubber bottoms. Are comfortable and fit you well. Are closed at the toe. Do not wear sandals. If you use a stepladder: Make sure that it is fully opened. Do not climb a closed stepladder. Make sure that both sides of the stepladder are locked into place. Ask someone to hold it for you, if possible. Clearly mark and make sure that you can see: Any grab bars or handrails. First and last steps. Where the edge of each step is. Use tools that help you move around (mobility aids) if they are needed. These include: Canes. Walkers. Scooters. Crutches. Turn on the lights when you go into a dark area. Replace any light bulbs as soon as they burn out. Set up your furniture so you have a clear path. Avoid moving your furniture around. If any of your floors are uneven, fix them. If there are any pets around you, be aware of where they are. Review your medicines with your doctor. Some medicines can make you feel dizzy. This can increase your chance of falling. Ask your doctor what other things that you can do to help prevent falls. This information is not intended to replace advice given to you by your health care provider. Make sure you discuss any questions you have with your health care provider. Document Released: 12/20/2008 Document Revised: 08/01/2015 Document Reviewed: 03/30/2014 Elsevier Interactive Patient Education  2017 [employer].

## 2022-06-24 NOTE — Progress Notes (Signed)
Subjective:   Peter Howe is a 82 y.o. male who presents for Medicare Annual/Subsequent preventive examination.  I connected with  Peter Howe on 06/24/22 by a audio enabled telemedicine application and verified that I am speaking with the correct person using two identifiers.  Patient Location: Home  Provider Location: Home Office  I discussed the limitations of evaluation and management by telemedicine. The patient expressed understanding and agreed to proceed.  Review of Systems     Cardiac Risk Factors include: hypertension;male gender;advanced age (>71men, >92 women);dyslipidemia     Objective:    Today's Vitals   06/24/22 0914  Weight: 177 lb (80.3 kg)  Height: 5\' 4"  (1.626 m)   Body mass index is 30.38 kg/m.     06/24/2022    9:34 AM 04/23/2022    6:13 PM 12/08/2021   11:01 AM 06/05/2021    2:30 PM 01/10/2021    5:08 PM 10/27/2020   11:46 PM 09/29/2020    2:33 AM  Advanced Directives  Does Patient Have a Medical Advance Directive? No No Yes No No No No  Type of Surveyor, minerals;Living will      Does patient want to make changes to medical advance directive?   No - Patient declined      Copy of Healthcare Power of Attorney in Chart?   No - copy requested      Would patient like information on creating a medical advance directive? No - Patient declined   No - Patient declined  No - Patient declined No - Patient declined;No - Guardian declined    Current Medications (verified) Outpatient Encounter Medications as of 06/24/2022  Medication Sig   acetaminophen (TYLENOL) 325 MG tablet Take 650 mg by mouth every 6 (six) hours as needed for mild pain.   atenolol (TENORMIN) 50 MG tablet Take 1 tablet (50 mg total) by mouth daily.   colchicine 0.6 MG tablet Take 1.2 mg (2 tablets) by mouth at the first sign of a gout flare followed by 0.6 mg (1 tablet) one hour later.   losartan (COZAAR) 50 MG tablet Take 1 tablet (50 mg total) by  mouth daily.   Multiple Vitamin (MULTIVITAMIN WITH MINERALS) TABS tablet Take 1 tablet by mouth daily.   nitroGLYCERIN (NITROSTAT) 0.4 MG SL tablet Place 1 tablet under the tongue every 5 (five) minutes as needed for chest pain.   rivaroxaban (XARELTO) 20 MG TABS tablet Take 1 tablet (20 mg total) by mouth daily with supper.   rosuvastatin (CRESTOR) 5 MG tablet Take 1 tablet (5 mg total) by mouth once a week.   sertraline (ZOLOFT) 25 MG tablet Take 1 tablet (25 mg total) by mouth daily.   zolpidem (AMBIEN) 5 MG tablet Take 1 tablet (5 mg total) by mouth at bedtime.   No facility-administered encounter medications on file as of 06/24/2022.    Allergies (verified) Morphine and related, Penicillins, Statins, and Sulfonamide derivatives   History: Past Medical History:  Diagnosis Date   Anxiety    on meds   Aortic atherosclerosis    Arthritis    generalized   Bilateral inguinal hernia 01/10/2021   Cancer    skin cancers removed   Diverticulosis 01/10/2021   GERD (gastroesophageal reflux disease)    on meds   Hyperlipidemia    on meds   Hypertension    on meds   Overweight(278.02)    obesity   Permanent atrial fibrillation  Seasonal allergies    Tachycardia-bradycardia    s/p PPM   Past Surgical History:  Procedure Laterality Date   ARM SKIN LESION BIOPSY / EXCISION Left    Melanoma -removed by Dr. Lurline Hare TUNNEL RELEASE Left 2022   PACEMAKER INSERTION  03/28/2003   St. Jude-PPM   PPM GENERATOR CHANGEOUT N/A 11/24/2018   Procedure: PPM GENERATOR CHANGEOUT;  Surgeon: Hillis Range, MD;  Location: [facility];  Service: Cardiovascular;  Laterality: N/A;   Family History  Problem Relation Age of Onset   Cancer Mother 51       unknown location   CAD Father 96   Breast cancer Sister    Breast cancer Sister    Stroke Sister    Breast cancer Sister    Breast cancer Sister    Diabetes Brother    Cancer Brother    Alcohol abuse Brother    Heart disease  Daughter    Colon polyps Neg Hx    Colon cancer Neg Hx    Esophageal cancer Neg Hx    Stomach cancer Neg Hx    Rectal cancer Neg Hx    Social History   Socioeconomic History   Marital status: Widowed    Spouse name: Not on file   Number of children: 1   Years of education: 12   Highest education level: High school graduate  Occupational History   Occupation: runs a Materials engineer    Comment: part time  Tobacco Use   Smoking status: Former    Packs/day: 1.00    Years: 30.00    Additional pack years: 0.00    Total pack years: 30.00    Types: Cigarettes    Start date: 03/30/1955    Quit date: 03/09/1993    Years since quitting: 29.3    Passive exposure: Past   Smokeless tobacco: Never  Vaping Use   Vaping Use: Never used  Substance and Sexual Activity   Alcohol use: Yes    Alcohol/week: 7.0 standard drinks of alcohol    Types: 1 Glasses of wine, 4 Cans of beer, 2 Shots of liquor per week   Drug use: No   Sexual activity: Not Currently    Partners: Female  Other Topics Concern   Not on file  Social History Narrative   Owns a Materials engineer, enjoys golf.   Lives in [place]   Brother lives 5 miles away   Social Determinants of Health   Financial Resource Strain: Low Risk  (06/24/2022)   Overall Financial Resource Strain (CARDIA)    Difficulty of Paying Living Expenses: Not hard at all  Food Insecurity: No Food Insecurity (06/24/2022)   Hunger Vital Sign    Worried About Running Out of Food in the Last Year: Never true    Ran Out of Food in the Last Year: Never true  Transportation Needs: No Transportation Needs (06/24/2022)   PRAPARE - Administrator, Civil Service (Medical): No    Lack of Transportation (Non-Medical): No  Physical Activity: Sufficiently Active (06/24/2022)   Exercise Vital Sign    Days of Exercise per Week: 5 days    Minutes of Exercise per Session: 60 min  Stress: No Stress Concern Present (06/24/2022)   [employer] of  Occupational Health - Occupational Stress Questionnaire    Feeling of Stress : Not at all  Social Connections: Moderately Integrated (06/24/2022)   Social Connection and Isolation Panel [NHANES]    Frequency of Communication  with Friends and Family: More than three times a week    Frequency of Social Gatherings with Friends and Family: More than three times a week    Attends Religious Services: More than 4 times per year    Active Member of [employer] or Organizations: Yes    Attends Banker Meetings: More than 4 times per year    Marital Status: Widowed    Tobacco Counseling Counseling given: Not Answered   Clinical Intake:  Pre-visit preparation completed: Yes  Pain : No/denies pain  Diabetes: No  How often do you need to have someone help you when you read instructions, pamphlets, or other written materials from your doctor or pharmacy?: 1 - Never  Diabetic?No   Interpreter Needed?: No  Information entered by :: Kandis Fantasia LPN   Activities of Daily Living    06/24/2022    9:34 AM 12/08/2021   11:00 AM  In your present state of health, do you have any difficulty performing the following activities:  Hearing? 0 0  Vision? 0 0  Difficulty concentrating or making decisions? 0 0  Walking or climbing stairs? 0 0  Dressing or bathing? 0 0  Doing errands, shopping? 0 0  Preparing Food and eating ? N N  Using the Toilet? N N  In the past six months, have you accidently leaked urine? N N  Do you have problems with loss of bowel control? N N  Managing your Medications? N N  Managing your Finances? N N  Housekeeping or managing your Housekeeping? N N    Patient Care Team: Sonny Masters, FNP as PCP - General (Family Medicine) Lanier Prude, MD as PCP - Electrophysiology (Cardiology) Tressia Danas, MD as Consulting Physician (Gastroenterology) Franky Macho, MD as Consulting Physician (General Surgery) Valeria Batman, MD (Inactive) as Consulting  Physician (Orthopedic Surgery) Nita Sells, MD (Dermatology) Delora Fuel, OD (Optometry)  Indicate any recent Medical Services you may have received from other than [facility] providers in the past year (date may be approximate).     Assessment:   This is a routine wellness examination for [place].  Hearing/Vision screen Hearing Screening - Comments:: HOH Vision Screening - Comments:: Wears rx glasses - up to date with routine eye exams with [facility]    Dietary issues and exercise activities discussed: Current Exercise Habits: Home exercise routine, Type of exercise: walking, Time (Minutes): 60, Frequency (Times/Week): 5, Weekly Exercise (Minutes/Week): 300, Intensity: Mild   Goals Addressed   None   Depression Screen    06/24/2022    9:33 AM 04/30/2022    9:54 AM 04/15/2022    3:34 PM 03/10/2022   11:39 AM 12/08/2021   10:58 AM 10/30/2021    9:13 AM 06/05/2021    2:40 PM  PHQ 2/9 Scores  PHQ - 2 Score 0 0 0 0 0 0 0  PHQ- 9 Score 0 3    0 2    Fall Risk    06/24/2022    9:15 AM 04/30/2022    9:54 AM 04/15/2022    3:33 PM 03/10/2022   11:39 AM 12/08/2021   11:00 AM  Fall Risk   Falls in the past year? 0 0 0 0 0  Number falls in past yr: 0    0  Injury with Fall? 0    0  Risk for fall due to : No Fall Risks    No Fall Risks  Follow up Falls prevention discussed;Education provided;Falls evaluation completed  Falls prevention discussed;Education provided;Falls evaluation completed    FALL RISK PREVENTION PERTAINING TO THE HOME:  Any stairs in or around the home? No  If so, are there any without handrails? No  Home free of loose throw rugs in walkways, pet beds, electrical cords, etc? Yes  Adequate lighting in your home to reduce risk of falls? Yes   ASSISTIVE DEVICES UTILIZED TO PREVENT FALLS:  Life alert? No  Use of a cane, walker or w/c? No  Grab bars in the bathroom? Yes  Shower chair or bench in shower? No  Elevated toilet seat or a handicapped toilet? Yes    TIMED UP AND GO:  Was the test performed? No . Telephonic visit   Cognitive Function:        06/24/2022    9:34 AM 06/05/2021    2:39 PM 06/03/2020    3:36 PM 05/02/2019    8:35 AM  6CIT Screen  What Year? 0 points 0 points 0 points 0 points  What month? 0 points 0 points 0 points 0 points  What time? 0 points 0 points 0 points 0 points  Count back from 20 0 points 0 points 0 points 0 points  Months in reverse 0 points 0 points 0 points 0 points  Repeat phrase 0 points 4 points 0 points 0 points  Total Score 0 points 4 points 0 points 0 points    Immunizations Immunization History  Administered Date(s) Administered   Fluad Quad(high Dose 65+) 02/05/2016, 12/24/2016, 01/31/2018, 02/26/2020, 01/17/2021, 03/10/2022   Influenza, High Dose Seasonal PF 01/25/2015, 01/25/2015, 02/05/2016, 02/05/2016, 12/24/2016, 12/24/2016, 01/31/2018, 01/31/2018   Influenza, Seasonal, Injecte, Preservative Fre 01/23/2014   Influenza-Unspecified 01/23/2014, 01/31/2018, 12/08/2018   Moderna SARS-COV2 Booster Vaccination 02/21/2020   Moderna Sars-Covid-2 Vaccination 04/01/2019, 04/29/2019, 02/21/2020   Pneumococcal Conjugate-13 12/15/2018   Pneumococcal Polysaccharide-23 04/24/2020   Zoster Recombinat (Shingrix) 04/25/2021, 10/30/2021    TDAP status: Due, Education has been provided regarding the importance of this vaccine. Advised may receive this vaccine at local pharmacy or Health Dept. Aware to provide a copy of the vaccination record if obtained from local pharmacy or Health Dept. Verbalized acceptance and understanding.  Flu Vaccine status: Up to date  Pneumococcal vaccine status: Up to date  Covid-19 vaccine status: Information provided on how to obtain vaccines.   Qualifies for Shingles Vaccine? Yes   Zostavax completed No   Shingrix Completed?: Yes  Screening Tests Health Maintenance  Topic Date Due   DTaP/Tdap/Td (1 - Tdap) Never done   COVID-19 Vaccine (3 - Moderna risk series)  03/20/2020   INFLUENZA VACCINE  10/08/2022   COLONOSCOPY (Pts 45-47yrs Insurance coverage will need to be confirmed)  01/21/2023   Medicare Annual Wellness (AWV)  06/24/2023   Pneumonia Vaccine 20+ Years old  Completed   Zoster Vaccines- Shingrix  Completed   HPV VACCINES  Aged Out    Health Maintenance  Health Maintenance Due  Topic Date Due   DTaP/Tdap/Td (1 - Tdap) Never done   COVID-19 Vaccine (3 - Moderna risk series) 03/20/2020    Colorectal cancer screening: No longer required.   Lung Cancer Screening: (Low Dose CT Chest recommended if Age 36-80 years, 30 pack-year currently smoking OR have quit w/in 15years.) does not qualify.   Lung Cancer Screening Referral: n/a  Additional Screening:  Hepatitis C Screening: does not qualify;   Vision Screening: Recommended annual ophthalmology exams for early detection of glaucoma and other disorders of the eye. Is the patient up to date  with their annual eye exam?  Yes  Who is the provider or what is the name of the office in which the patient attends annual eye exams? [facility]  If pt is not established with a provider, would they like to be referred to a provider to establish care? No .   Dental Screening: Recommended annual dental exams for proper oral hygiene  Community Resource Referral / Chronic Care Management: CRR required this visit?  No   CCM required this visit?  No      Plan:     I have personally reviewed and noted the following in the patient's chart:   Medical and social history Use of alcohol, tobacco or illicit drugs  Current medications and supplements including opioid prescriptions. Patient is not currently taking opioid prescriptions. Functional ability and status Nutritional status Physical activity Advanced directives List of other physicians Hospitalizations, surgeries, and ER visits in previous 12 months Vitals Screenings to include cognitive, depression, and falls Referrals and  appointments  In addition, I have reviewed and discussed with patient certain preventive protocols, quality metrics, and best practice recommendations. A written personalized care plan for preventive services as well as general preventive health recommendations were provided to patient.     Durwin Nora, [place]   09/05/5282   Due to this being a virtual visit, the after visit summary with patients personalized plan was offered to patient via mail or my-chart. Patient would like to access on my-chart  Nurse Notes: No concerns

## 2022-07-01 ENCOUNTER — Ambulatory Visit (INDEPENDENT_AMBULATORY_CARE_PROVIDER_SITE_OTHER): Payer: Medicare HMO | Admitting: Family Medicine

## 2022-07-01 ENCOUNTER — Encounter: Payer: Self-pay | Admitting: Family Medicine

## 2022-07-01 VITALS — BP 138/70 | HR 64 | Temp 97.0°F | Ht 64.0 in | Wt 177.8 lb

## 2022-07-01 DIAGNOSIS — J301 Allergic rhinitis due to pollen: Secondary | ICD-10-CM

## 2022-07-01 DIAGNOSIS — R682 Dry mouth, unspecified: Secondary | ICD-10-CM | POA: Diagnosis not present

## 2022-07-01 DIAGNOSIS — M25512 Pain in left shoulder: Secondary | ICD-10-CM

## 2022-07-01 DIAGNOSIS — M25511 Pain in right shoulder: Secondary | ICD-10-CM | POA: Diagnosis not present

## 2022-07-01 DIAGNOSIS — M1A09X0 Idiopathic chronic gout, multiple sites, without tophus (tophi): Secondary | ICD-10-CM | POA: Diagnosis not present

## 2022-07-01 DIAGNOSIS — G8929 Other chronic pain: Secondary | ICD-10-CM

## 2022-07-01 MED ORDER — LEVOCETIRIZINE DIHYDROCHLORIDE 5 MG PO TABS
5.0000 mg | ORAL_TABLET | Freq: Every evening | ORAL | 1 refills | Status: DC
Start: 2022-07-01 — End: 2023-02-23

## 2022-07-01 MED ORDER — PREDNISONE 20 MG PO TABS
40.0000 mg | ORAL_TABLET | Freq: Every day | ORAL | 0 refills | Status: AC
Start: 2022-07-01 — End: 2022-07-06

## 2022-07-01 NOTE — Progress Notes (Signed)
Subjective:  Patient ID: Peter Howe, male    DOB: [birth year], 82 y.o.   MRN: [id number]  Patient Care Team: Sonny Masters, FNP as PCP - General (Family Medicine) Lanier Prude, MD as PCP - Electrophysiology (Cardiology) Tressia Danas, MD as Consulting Physician (Gastroenterology) Franky Macho, MD as Consulting Physician (General Surgery) Valeria Batman, MD (Inactive) as Consulting Physician (Orthopedic Surgery) Nita Sells, MD (Dermatology) Delora Fuel, OD (Optometry)   Chief Complaint:  Shoulder Pain (Bilateral x 3 months. PT is not helping. ) and dry mouth (Due to patient sleeping with mouth open )   HPI: Peter Howe is a 82 y.o. male presenting on 07/01/2022 for Shoulder Pain (Bilateral x 3 months. PT is not helping. ) and dry mouth (Due to patient sleeping with mouth open )   1. Chronic pain of both shoulders Pt reports ongoing bilateral shoulder pain. He has been going to PT with some relief of symptoms. States he still has tightness and stiffness in his shoulders. Would like to see ortho. No new or worsening symptoms. No injuries reported. Has been taking Tylenol with some relief of symptoms.   2. Dry mouth Ongoing. Feels this is due to sleeping with mouth open. He does report nasal congestion which contributes to the mouth breathing. States he has been using a humidifier and nasal strip which has not been beneficial. Has started using Flonase with minimal relief of symptoms.     Relevant past medical, surgical, family, and social history reviewed and updated as indicated.  Allergies and medications reviewed and updated. Data reviewed: Chart in Epic.   Past Medical History:  Diagnosis Date   Anxiety    on meds   Aortic atherosclerosis    Arthritis    generalized   Bilateral inguinal hernia 01/10/2021   Cancer    skin cancers removed   Diverticulosis 01/10/2021   GERD (gastroesophageal reflux disease)    on meds   Hyperlipidemia    on  meds   Hypertension    on meds   Overweight(278.02)    obesity   Permanent atrial fibrillation    Seasonal allergies    Tachycardia-bradycardia    s/p PPM    Past Surgical History:  Procedure Laterality Date   ARM SKIN LESION BIOPSY / EXCISION Left    Melanoma -removed by Dr. Lurline Hare TUNNEL RELEASE Left 2022   PACEMAKER INSERTION  03/28/2003   St. 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  Tobacco Use   Smoking status: Former    Packs/day: 1.00    Years: 30.00    Additional pack years: 0.00    Total pack years: 30.00    Types: Cigarettes    Start date: 03/30/1955    Quit date: 03/09/1993    Years since quitting: 29.3    Passive exposure: Past   Smokeless tobacco: Never  Vaping Use   Vaping Use: Never used  Substance and Sexual Activity   Alcohol use: Yes  Alcohol/week: 7.0 standard drinks of alcohol    Types: 1 Glasses of wine, 4 Cans of beer, 2 Shots of liquor per week   Drug use: No   Sexual activity: Not Currently    Partners: Female  Other Topics Concern   Not on file  Social History Narrative   Owns a Materials engineer, enjoys golf.   Lives in [place]   Brother lives 5 miles away   Social Determinants of Health   Financial Resource Strain: Low Risk  (06/24/2022)   Overall Financial Resource Strain (CARDIA)    Difficulty of Paying Living Expenses: Not hard at all  Food Insecurity: No Food Insecurity (06/24/2022)   Hunger Vital Sign    Worried About Running Out of Food in the Last Year: Never true    Ran Out of Food in the Last  Year: Never true  Transportation Needs: No Transportation Needs (06/24/2022)   PRAPARE - Administrator, Civil Service (Medical): No    Lack of Transportation (Non-Medical): No  Physical Activity: Sufficiently Active (06/24/2022)   Exercise Vital Sign    Days of Exercise per Week: 5 days    Minutes of Exercise per Session: 60 min  Stress: No Stress Concern Present (06/24/2022)   [employer] of Occupational Health - Occupational Stress Questionnaire    Feeling of Stress : Not at all  Social Connections: Moderately Integrated (06/24/2022)   Social Connection and Isolation Panel [NHANES]    Frequency of Communication with Friends and Family: More than three times a week    Frequency of Social Gatherings with Friends and Family: More than three times a week    Attends Religious Services: More than 4 times per year    Active Member of [employer] or Organizations: Yes    Attends Banker Meetings: More than 4 times per year    Marital Status: Widowed  Intimate Partner Violence: Not At Risk (06/24/2022)   Humiliation, Afraid, Rape, and Kick questionnaire    Fear of Current or Ex-Partner: No    Emotionally Abused: No    Physically Abused: No    Sexually Abused: No    Outpatient Encounter Medications as of 07/01/2022  Medication Sig   levocetirizine (XYZAL) 5 MG tablet Take 1 tablet (5 mg total) by mouth every evening.   predniSONE (DELTASONE) 20 MG tablet Take 2 tablets (40 mg total) by mouth daily with breakfast for 5 days. 2 po daily for 5 days   acetaminophen (TYLENOL) 325 MG tablet Take 650 mg by mouth every 6 (six) hours as needed for mild pain.   atenolol (TENORMIN) 50 MG tablet Take 1 tablet (50 mg total) by mouth daily.   colchicine 0.6 MG tablet Take 1.2 mg (2 tablets) by mouth at the first sign of a gout flare followed by 0.6 mg (1 tablet) one hour later.   losartan (COZAAR) 50 MG tablet Take 1 tablet (50 mg total) by mouth daily.   Multiple Vitamin  (MULTIVITAMIN WITH MINERALS) TABS tablet Take 1 tablet by mouth daily.   nitroGLYCERIN (NITROSTAT) 0.4 MG SL tablet Place 1 tablet under the tongue every 5 (five) minutes as needed for chest pain.   rivaroxaban (XARELTO) 20 MG TABS tablet Take 1 tablet (20 mg total) by mouth daily with supper.   rosuvastatin (CRESTOR) 5 MG tablet Take 1 tablet (5 mg total) by mouth once a week.   sertraline (ZOLOFT) 25 MG tablet Take 1 tablet (25 mg total) by mouth daily.  zolpidem (AMBIEN) 5 MG tablet Take 1 tablet (5 mg total) by mouth at bedtime.   No facility-administered encounter medications on file as of 07/01/2022.    Allergies  Allergen Reactions   Morphine And Related Other (See Comments)    Patient states it wired him up and didn't help. Prefers not to take again.   Penicillins Rash    Has patient had a PCN reaction causing immediate rash, facial/tongue/throat swelling, SOB or lightheadedness with hypotension: No Has patient had a PCN reaction causing severe rash involving mucus membranes or skin necrosis: No Has patient had a PCN reaction that required hospitalization No Has patient had a PCN reaction occurring within the last 10 years: No If all of the above answers are "NO", then may proceed with Cephalosporin use.    Statins    Sulfonamide Derivatives Rash    Review of Systems  Constitutional:  Negative for activity change, appetite change, chills, diaphoresis, fatigue, fever and unexpected weight change.  HENT:  Positive for congestion, postnasal drip and rhinorrhea.        Dry mouth  Eyes: Negative.  Negative for photophobia and visual disturbance.  Respiratory:  Negative for cough, chest tightness and shortness of breath.   Cardiovascular:  Negative for chest pain, palpitations and leg swelling.  Gastrointestinal:  Negative for abdominal pain, blood in stool, constipation, diarrhea, nausea and vomiting.  Endocrine: Negative.  Negative for polydipsia, polyphagia and polyuria.   Genitourinary:  Negative for decreased urine volume, difficulty urinating, dysuria, frequency and urgency.  Musculoskeletal:  Positive for arthralgias and myalgias. Negative for back pain, gait problem, joint swelling, neck pain and neck stiffness.  Skin: Negative.   Allergic/Immunologic: Negative.   Neurological:  Negative for dizziness, tremors, seizures, syncope, facial asymmetry, speech difficulty, weakness, light-headedness, numbness and headaches.  Hematological: Negative.   Psychiatric/Behavioral:  Negative for confusion, hallucinations, sleep disturbance and suicidal ideas.   All other systems reviewed and are negative.       Objective:  BP 138/70   Pulse 64   Temp (!) 97 F (36.1 C) (Temporal)   Ht  (1.626 m)   Wt 177 lb 12.8 oz (80.6 kg)   SpO2 94%   BMI 30.52 kg/m    Wt Readings from Last 3 Encounters:  07/01/22 177 lb 12.8 oz (80.6 kg)  06/24/22 177 lb (80.3 kg)  04/30/22 177 lb 9.6 oz (80.6 kg)    Physical Exam Vitals and nursing note reviewed.  Constitutional:      General: He is not in acute distress.    Appearance: Normal appearance. He is well-developed and well-groomed. He is not ill-appearing, toxic-appearing or diaphoretic.  HENT:     Head: Normocephalic and atraumatic.     Jaw: There is normal jaw occlusion.     Right Ear: Hearing normal. A middle ear effusion is present.     Left Ear: Hearing normal. A middle ear effusion is present.     Nose: Congestion present.     Right Turbinates: Enlarged.     Left Turbinates: Enlarged.     Mouth/Throat:     Lips: Pink.     Mouth: Mucous membranes are moist.     Pharynx: Oropharynx is clear. Uvula midline. No posterior oropharyngeal erythema.  Eyes:     General: Lids are normal.     Extraocular Movements: Extraocular movements intact.     Conjunctiva/sclera: Conjunctivae normal.     Pupils: Pupils are equal, round, and reactive to light.  Neck:  Thyroid: No thyroid mass, thyromegaly or thyroid  tenderness.     Vascular: No carotid bruit or JVD.     Trachea: Trachea and phonation normal.  Cardiovascular:     Rate and Rhythm: Normal rate and regular rhythm.     Chest Wall: PMI is not displaced.     Pulses: Normal pulses.     Heart sounds: Normal heart sounds. No murmur heard.    No friction rub. No gallop.  Pulmonary:     Effort: Pulmonary effort is normal. No respiratory distress.     Breath sounds: Normal breath sounds. No wheezing.  Abdominal:     General: Bowel sounds are normal. There is no distension or abdominal bruit.     Palpations: Abdomen is soft. There is no hepatomegaly or splenomegaly.     Tenderness: There is no abdominal tenderness. There is no right CVA tenderness or left CVA tenderness.     Hernia: No hernia is present.  Musculoskeletal:     Right shoulder: Tenderness present. No swelling, deformity, effusion, laceration, bony tenderness or crepitus. Decreased range of motion. Normal pulse.     Left shoulder: Tenderness present. No swelling, deformity, effusion, laceration, bony tenderness or crepitus. Decreased range of motion. Normal pulse.     Right upper arm: Normal.     Left upper arm: Normal.     Cervical back: Normal, normal range of motion and neck supple.     Right lower leg: No edema.     Left lower leg: No edema.  Lymphadenopathy:     Cervical: No cervical adenopathy.  Skin:    General: Skin is warm and dry.     Capillary Refill: Capillary refill takes less than 2 seconds.     Coloration: Skin is not cyanotic, jaundiced or pale.     Findings: No rash.  Neurological:     General: No focal deficit present.     Mental Status: He is alert and oriented to person, place, and time.     Sensory: Sensation is intact.     Motor: Motor function is intact.     Coordination: Coordination is intact.     Gait: Gait is intact.     Deep Tendon Reflexes: Reflexes are normal and symmetric.  Psychiatric:        Attention and Perception: Attention and  perception normal.        Mood and Affect: Mood and affect normal.        Speech: Speech normal.        Behavior: Behavior normal. Behavior is cooperative.        Thought Content: Thought content normal.        Cognition and Memory: Cognition and memory normal.        Judgment: Judgment normal.     Results for orders placed or performed in visit on 05/05/22  CUP PACEART REMOTE DEVICE CHECK  Result Value Ref Range   Date Time Interrogation Session [id number]    Pulse Generator Manufacturer SJ[employer]    Pulse Gen Model 2272 Assurity MRI    Pulse Gen Serial Number [id number]    Clinic Name [facility]    Implantable Pulse Generator Type Implantable Pulse Generator    Implantable Pulse Generator Implant Date 95621308    Implantable Lead Manufacturer [facility]    Implantable Lead Model 1646T IsoFlex S    Implantable Lead Serial Number [id number]    Implantable Lead Implant Date 65784696    Implantable Lead Location Detail 1 UNKNOWN  Implantable Lead Location [id number]    Implantable Lead Connection Status [id number]    Implantable Lead Manufacturer PACE    Implantable Lead Model 1242T Passive Plus    Implantable Lead Serial Number [id number]    Implantable Lead Implant Date [id number]    Implantable Lead Location Detail 1 UNKNOWN    Implantable Lead Location [id number]    Implantable Lead Connection Status [phone]    Lead Channel Setting Sensing Sensitivity 2.0 mV   Lead Channel Setting Sensing Adaptation Mode Fixed Pacing    Lead Channel Setting Pacing Pulse Width 0.4 ms   Lead Channel Setting Pacing Amplitude 2.5 V   Lead Channel Status NULL    Lead Channel Impedance Value 680 ohm   Lead Channel Sensing Intrinsic Amplitude 12.0 mV   Lead Channel Pacing Threshold Amplitude 0.75 V   Lead Channel Pacing Threshold Pulse Width 0.4 ms   Battery Status MOS    Battery Remaining Longevity 102 mo   Battery Remaining Percentage 77.0 %   Battery Voltage 3.01 V   Brady Statistic RV Percent Paced 57.0 %        Pertinent labs & imaging results that were available during my care of the patient were reviewed by me and considered in my medical decision making.  Assessment & Plan:  Ausar was seen today for shoulder pain and dry mouth.  Diagnoses and all orders for this visit:  Chronic pain of both shoulders Will obtain arthritis panel to evaluate for underlying causes of ongoing symptoms. Will burst with steroids to see if beneficial. Referral to ortho as requested.  -     Ambulatory referral to Orthopedic Surgery -     Arthritis Panel -     predniSONE (DELTASONE) 20 MG tablet; Take 2 tablets (40 mg total) by mouth daily with breakfast for 5 days. 2 po daily for 5 days  Dry mouth Due to reports of arthritic pain and dry mouth, will for potential underlying causes such as Sjogren's. Pt does have allergic rhinitis symptoms, will start antihistamine.  -     levocetirizine (XYZAL) 5 MG tablet; Take 1 tablet (5 mg total) by mouth every evening. -     Arthritis Panel  Seasonal allergic rhinitis due to pollen Start below. Continue flonase. Report new, worsening, or persistent symptoms.  -     levocetirizine (XYZAL) 5 MG tablet; Take 1 tablet (5 mg total) by mouth every evening.     Continue all other maintenance medications.  Follow up plan: Return if symptoms worsen or fail to improve.   Continue healthy lifestyle choices, including diet (rich in fruits, vegetables, and lean proteins, and low in salt and simple carbohydrates) and exercise (at least 30 minutes of moderate physical activity daily).  Educational handout given for shoulder pain  The above assessment and management plan was discussed with the patient. The patient verbalized understanding of and has agreed to the management plan. Patient is aware to call the clinic if they develop any new symptoms or if symptoms persist or worsen. Patient is aware when to return to the clinic for a follow-up visit. Patient educated on when it is  appropriate to go to the emergency department.   Kari Baars, FNP-C [facility] [phone]

## 2022-07-02 LAB — ARTHRITIS PANEL
Basophils Absolute: 0.1 10*3/uL (ref 0.0–0.2)
Basos: 1 %
EOS (ABSOLUTE): 0.2 10*3/uL (ref 0.0–0.4)
Eos: 2 %
Hematocrit: 41.6 % (ref 37.5–51.0)
Hemoglobin: 13.7 g/dL (ref 13.0–17.7)
Immature Grans (Abs): 0 10*3/uL (ref 0.0–0.1)
Immature Granulocytes: 0 %
Lymphocytes Absolute: 1 10*3/uL (ref 0.7–3.1)
Lymphs: 13 %
MCH: 31.9 pg (ref 26.6–33.0)
MCHC: 32.9 g/dL (ref 31.5–35.7)
MCV: 97 fL (ref 79–97)
Monocytes Absolute: 0.7 10*3/uL (ref 0.1–0.9)
Monocytes: 9 %
Neutrophils Absolute: 5.9 10*3/uL (ref 1.4–7.0)
Neutrophils: 75 %
Platelets: 190 10*3/uL (ref 150–450)
RBC: 4.3 x10E6/uL (ref 4.14–5.80)
RDW: 14 % (ref 11.6–15.4)
Rheumatoid fact SerPl-aCnc: <10 IU/mL (ref <14.0)
Sed Rate: 3 mm/hr (ref 0–30)
Uric Acid: 9.4 mg/dL — ABNORMAL HIGH (ref 3.8–8.4)
WBC: 7.8 10*3/uL (ref 3.4–10.8)

## 2022-07-02 MED ORDER — ALLOPURINOL 100 MG PO TABS
100.0000 mg | ORAL_TABLET | Freq: Every day | ORAL | 6 refills | Status: DC
Start: 2022-07-02 — End: 2022-08-04

## 2022-07-02 NOTE — Addendum Note (Signed)
Addended by: Sonny Masters on: 07/02/2022 07:52 AM   Modules accepted: Orders

## 2022-07-08 ENCOUNTER — Other Ambulatory Visit (INDEPENDENT_AMBULATORY_CARE_PROVIDER_SITE_OTHER): Payer: Medicare HMO

## 2022-07-08 ENCOUNTER — Encounter: Payer: Self-pay | Admitting: Orthopedic Surgery

## 2022-07-08 ENCOUNTER — Ambulatory Visit: Payer: Medicare HMO | Admitting: Orthopedic Surgery

## 2022-07-08 DIAGNOSIS — G8929 Other chronic pain: Secondary | ICD-10-CM

## 2022-07-08 DIAGNOSIS — M25511 Pain in right shoulder: Secondary | ICD-10-CM

## 2022-07-08 DIAGNOSIS — M542 Cervicalgia: Secondary | ICD-10-CM

## 2022-07-08 DIAGNOSIS — M25512 Pain in left shoulder: Secondary | ICD-10-CM

## 2022-07-10 ENCOUNTER — Encounter: Payer: Self-pay | Admitting: Orthopedic Surgery

## 2022-07-10 NOTE — Progress Notes (Unsigned)
Office Visit Note   Patient: Peter Howe           Date of Birth: 1940/04/28           MRN: [id number] Visit Date: 07/08/2022 Requested by: Sonny Masters, FNP [address] [place],  [place] [address] PCP: Sonny Masters, FNP  Subjective: Chief Complaint  Patient presents with   Right Shoulder - Pain   Left Shoulder - Pain    HPI: Peter Howe is a 82 y.o. male who presents to the office reporting bilateral shoulder pain since December.  Does not report any injury.  Was playing golf 5 days a week and now can seldom play.  Originally the right shoulder pain was worse but now it is right equal to left.  Localizes the pain more in the trapezial region on both sides.  Radiates down around the deltoid but not below the elbows.  Patient has had 14 physical therapy visits which has helped with range of motion.  He states that both arms feel weak.  Describes left hand numbness but has history of prior carpal tunnel release on that side.  Pain shoots into the right shoulder when he lifts something.  Recently his uric acid was high at 9.4.  Does have a history of gout and takes allopurinol and or colchicine.  Patient does have a pacemaker which is not consistent with MRI scanning..                ROS: All systems reviewed are negative as they relate to the chief complaint within the history of present illness.  Patient denies fevers or chills.  Assessment & Plan: Visit Diagnoses:  1. Chronic pain of both shoulders   2. Neck pain   3. Cervicalgia     Plan: Impression is cervical spine degenerative disc disease with reasonably good shoulder exams.  I think this is likely coming from his neck.  Plan is CT scan of the neck with possible ESI's to follow.  This is bothering him enough that he would consider cervical spine ESI.  Follow-Up Instructions: No follow-ups on file.   Orders:  Orders Placed This Encounter  Procedures   XR Shoulder Left   XR Shoulder Right   XR Cervical Spine 2  or 3 views   CT CERVICAL SPINE WO CONTRAST   No orders of the defined types were placed in this encounter.     Procedures: No procedures performed   Clinical Data: No additional findings.  Objective: Vital Signs: There were no vitals taken for this visit.  Physical Exam:  Constitutional: Patient appears well-developed HEENT:  Head: Normocephalic Eyes:EOM are normal Neck: Normal range of motion Cardiovascular: Normal rate Pulmonary/chest: Effort normal Neurologic: Patient is alert Skin: Skin is warm Psychiatric: Patient has normal mood and affect  Ortho Exam: Ortho exam demonstrates pretty reasonable cervical spine motion with flexion chin to chest extension 40 degrees rotation 60 degrees.  5 out of 5 grip EPL FPL interosseous resection extension bicep triceps and deltoid strength with good rotator cuff strength infraspinatus supraspinatus and subscap muscle testing.  No definite paresthesias C5-T1.  Does have some tenderness to palpation of the upper trapezius on both sides.  No AC joint tenderness.  No crepitus with internal and external rotation of either shoulder.  Specialty Comments:  No specialty comments available.  Imaging: No results found.   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   Controlled substance agreement signed 05/19/2019   Elevated liver enzymes 06/08/2017   Pacemaker 06/04/2017   Gastroesophageal reflux disease 02/05/2016   Essential hypertension with goal blood pressure less than 130/80 09/18/2013   ATRIAL FIBRILLATION 01/02/2010   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   Past Medical History:  Diagnosis Date   Anxiety    on meds    Aortic atherosclerosis (HCC)    Arthritis    generalized   Bilateral inguinal hernia 01/10/2021   Cancer (HCC)    skin cancers removed   Diverticulosis 01/10/2021   GERD (gastroesophageal reflux disease)    on meds   Hyperlipidemia    on meds   Hypertension    on meds   Overweight(278.02)    obesity   Permanent atrial fibrillation (HCC)    Seasonal allergies    Tachycardia-bradycardia (HCC)    s/p PPM    Family History  Problem Relation Age of Onset   Cancer Mother 33       unknown location   CAD Father 90   Breast cancer Sister    Breast cancer Sister    Stroke Sister    Breast cancer Sister    Breast cancer Sister    Diabetes Brother    Cancer Brother    Alcohol abuse Brother    Heart disease Daughter    Colon polyps Neg Hx    Colon cancer Neg Hx    Esophageal cancer Neg Hx    Stomach cancer Neg Hx    Rectal cancer Neg Hx     Past Surgical History:  Procedure Laterality Date   ARM SKIN LESION BIOPSY / EXCISION Left    Melanoma -removed by Dr. Lurline Hare TUNNEL RELEASE Left 2022   PACEMAKER INSERTION  03/28/2003   St. 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

## 2022-08-04 ENCOUNTER — Ambulatory Visit (INDEPENDENT_AMBULATORY_CARE_PROVIDER_SITE_OTHER): Payer: Medicare HMO

## 2022-08-04 ENCOUNTER — Encounter: Payer: Self-pay | Admitting: Family Medicine

## 2022-08-04 ENCOUNTER — Ambulatory Visit (INDEPENDENT_AMBULATORY_CARE_PROVIDER_SITE_OTHER): Payer: Medicare HMO | Admitting: Family Medicine

## 2022-08-04 DIAGNOSIS — F5101 Primary insomnia: Secondary | ICD-10-CM | POA: Diagnosis not present

## 2022-08-04 DIAGNOSIS — I495 Sick sinus syndrome: Secondary | ICD-10-CM

## 2022-08-04 MED ORDER — ZOLPIDEM TARTRATE 5 MG PO TABS: 5.0000 mg | ORAL_TABLET | Freq: Every day | ORAL | 3 refills | Status: DC

## 2022-08-04 NOTE — Addendum Note (Signed)
Addended by: Sonny Masters on: 08/04/2022 04:18 PM   Modules accepted: Level of Service

## 2022-08-04 NOTE — Progress Notes (Signed)
Subjective:  Patient ID: Peter Howe, male    DOB: [birth year], 82 y.o.   MRN: [id number]  Patient Care Team: Sonny Masters, FNP as PCP - General (Family Medicine) Lanier Prude, MD as PCP - Electrophysiology (Cardiology) Tressia Danas, MD as Consulting Physician (Gastroenterology) Franky Macho, MD as Consulting Physician (General Surgery) Valeria Batman, MD (Inactive) as Consulting Physician (Orthopedic Surgery) Nita Sells, MD (Dermatology) Delora Fuel, [place] (Optometry)   Chief Complaint:  Medical Management of Chronic Issues (3 month chronic follow up )   HPI: Peter Howe is a 82 y.o. male presenting on 08/04/2022 for Medical Management of Chronic Issues (3 month chronic follow up )   Insomnia Primary symptoms: fragmented sleep, sleep disturbance, difficulty falling asleep.   The current episode started more than one year. The problem occurs intermittently. The problem has been waxing and waning since onset. The symptoms are aggravated by anxiety and SSRI use. Past treatments include medication. The treatment provided significant relief. Typical bedtime:  8-10 P.M..  How long after going to bed to you fall asleep: over an hour.   Duration of naps:  One to two hours.  Prior diagnostic workup includes:  Blood work.       Relevant past medical, surgical, family, and social history reviewed and updated as indicated.  Allergies and medications reviewed and updated. Data reviewed: Chart in Epic.   Past Medical History:  Diagnosis Date   Anxiety    on meds   Aortic atherosclerosis (HCC)    Arthritis    generalized   Bilateral inguinal hernia 01/10/2021   Cancer (HCC)    skin cancers removed   Diverticulosis 01/10/2021   GERD (gastroesophageal reflux disease)    on meds   Hyperlipidemia    on meds   Hypertension    on meds   Overweight(278.02)    obesity   Permanent atrial fibrillation (HCC)    Seasonal allergies    Tachycardia-bradycardia  (HCC)    s/p PPM    Past Surgical History:  Procedure Laterality Date   ARM SKIN LESION BIOPSY / EXCISION Left    Melanoma -removed by Dr. Lurline Hare TUNNEL RELEASE Left 2022   PACEMAKER INSERTION  03/28/2003   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  Tobacco Use   Smoking status: Former    Packs/day: 1.00    Years: 30.00    Additional pack years: 0.00    Total pack years: 30.00    Types: Cigarettes    Start date: 03/30/1955    Quit date: 03/09/1993    Years since quitting: 29.4    Passive exposure: Past   Smokeless tobacco: Never  Vaping Use   Vaping Use: Never used  Substance and Sexual Activity   Alcohol use: Yes    Alcohol/week: 7.0 standard drinks of alcohol    Types: 1 Glasses of wine, 4 Cans of beer, 2 Shots of liquor per week   Drug use: No   Sexual activity:  Not Currently    Partners: Female  Other Topics Concern   Not on file  Social History Narrative   Owns a Materials engineer, enjoys golf.   Lives in [place]   Brother lives 5 miles away   Social Determinants of Health   Financial Resource Strain: Low Risk  (06/24/2022)   Overall Financial Resource Strain (CARDIA)    Difficulty of Paying Living Expenses: Not hard at all  Food Insecurity: No Food Insecurity (06/24/2022)   Hunger Vital Sign    Worried About Running Out of Food in the Last Year: Never true    Ran Out of Food in the Last Year: Never true  Transportation Needs: No Transportation Needs (06/24/2022)   PRAPARE - Administrator, Civil Service (Medical): No     Lack of Transportation (Non-Medical): No  Physical Activity: Sufficiently Active (06/24/2022)   Exercise Vital Sign    Days of Exercise per Week: 5 days    Minutes of Exercise per Session: 60 min  Stress: No Stress Concern Present (06/24/2022)   [employer] of Occupational Health - Occupational Stress Questionnaire    Feeling of Stress : Not at all  Social Connections: Moderately Integrated (06/24/2022)   Social Connection and Isolation Panel [NHANES]    Frequency of Communication with Friends and Family: More than three times a week    Frequency of Social Gatherings with Friends and Family: More than three times a week    Attends Religious Services: More than 4 times per year    Active Member of [employer] or Organizations: Yes    Attends Banker Meetings: More than 4 times per year    Marital Status: Widowed  Intimate Partner Violence: Not At Risk (06/24/2022)   Humiliation, Afraid, Rape, and Kick questionnaire    Fear of Current or Ex-Partner: No    Emotionally Abused: No    Physically Abused: No    Sexually Abused: No    Outpatient Encounter Medications as of 08/04/2022  Medication Sig   acetaminophen (TYLENOL) 325 MG tablet Take 650 mg by mouth every 6 (six) hours as needed for mild pain.   atenolol (TENORMIN) 50 MG tablet Take 1 tablet (50 mg total) by mouth daily.   colchicine 0.6 MG tablet Take 1.2 mg (2 tablets) by mouth at the first sign of a gout flare followed by 0.6 mg (1 tablet) one hour later.   levocetirizine (XYZAL) 5 MG tablet Take 1 tablet (5 mg total) by mouth every evening.   losartan (COZAAR) 50 MG tablet Take 1 tablet (50 mg total) by mouth daily.   Multiple Vitamin (MULTIVITAMIN WITH MINERALS) TABS tablet Take 1 tablet by mouth daily.   nitroGLYCERIN (NITROSTAT) 0.4 MG SL tablet Place 1 tablet under the tongue every 5 (five) minutes as needed for chest pain.   rivaroxaban (XARELTO) 20 MG TABS tablet Take 1 tablet (20 mg total) by mouth daily with  supper.   rosuvastatin (CRESTOR) 5 MG tablet Take 1 tablet (5 mg total) by mouth once a week.   sertraline (ZOLOFT) 25 MG tablet Take 1 tablet (25 mg total) by mouth daily.   [DISCONTINUED] zolpidem (AMBIEN) 5 MG tablet Take 1 tablet (5 mg total) by mouth at bedtime.   zolpidem (AMBIEN) 5 MG tablet Take 1 tablet (5 mg total) by mouth at bedtime.   [DISCONTINUED] allopurinol (ZYLOPRIM) 100 MG tablet Take 1 tablet (100 mg total) by mouth daily.   No facility-administered encounter medications on file as of  08/04/2022.    Allergies  Allergen Reactions   Allopurinol Itching   Morphine And Codeine Other (See Comments)    Patient states it wired him up and didn't help. Prefers not to take again.   Penicillins Rash    Has patient had a PCN reaction causing immediate rash, facial/tongue/throat swelling, SOB or lightheadedness with hypotension: No Has patient had a PCN reaction causing severe rash involving mucus membranes or skin necrosis: No Has patient had a PCN reaction that required hospitalization No Has patient had a PCN reaction occurring within the last 10 years: No If all of the above answers are "NO", then may proceed with Cephalosporin use.    Statins    Sulfonamide Derivatives Rash    Review of Systems  Musculoskeletal:  Positive for arthralgias and myalgias.  Psychiatric/Behavioral:  Positive for sleep disturbance. The patient has insomnia.   All other systems reviewed and are negative.       Objective:  BP 133/68   Pulse 63   Temp 97.9 F (36.6 C) (Temporal)   Ht 5\' 4"  (1.626 m)   Wt 175 lb 3.2 oz (79.5 kg)   SpO2 95%   BMI 30.07 kg/m    Wt Readings from Last 3 Encounters:  08/04/22 175 lb 3.2 oz (79.5 kg)  07/01/22 177 lb 12.8 oz (80.6 kg)  06/24/22 177 lb (80.3 kg)    Physical Exam Vitals and nursing note reviewed.  Constitutional:      General: He is not in acute distress.    Appearance: Normal appearance. He is well-developed and well-groomed. He is not  ill-appearing, toxic-appearing or diaphoretic.  HENT:     Head: Normocephalic and atraumatic.     Jaw: There is normal jaw occlusion.     Right Ear: Hearing normal.     Left Ear: Hearing normal.     Nose: Nose normal.     Mouth/Throat:     Lips: Pink.     Mouth: Mucous membranes are moist.     Pharynx: Oropharynx is clear. Uvula midline.  Eyes:     General: Lids are normal.     Extraocular Movements: Extraocular movements intact.     Conjunctiva/sclera: Conjunctivae normal.     Pupils: Pupils are equal, round, and reactive to light.  Neck:     Trachea: Trachea and phonation normal.  Cardiovascular:     Rate and Rhythm: Normal rate. Rhythm irregularly irregular.     Chest Wall: PMI is not displaced.     Pulses: Normal pulses.     Heart sounds: Normal heart sounds. No murmur heard.    No friction rub. No gallop.  Pulmonary:     Effort: Pulmonary effort is normal. No respiratory distress.     Breath sounds: Normal breath sounds. No wheezing.  Abdominal:     General: There is no abdominal bruit.     Palpations: There is no hepatomegaly or splenomegaly.  Musculoskeletal:        General: Normal range of motion.     Cervical back: Normal range of motion.     Right lower leg: No edema.     Left lower leg: No edema.  Skin:    General: Skin is warm and dry.     Capillary Refill: Capillary refill takes less than 2 seconds.     Coloration: Skin is not cyanotic, jaundiced or pale.     Findings: No rash.  Neurological:     General: No focal deficit present.  Mental Status: He is alert and oriented to person, place, and time.     Sensory: Sensation is intact.     Motor: Motor function is intact.     Coordination: Coordination is intact.     Gait: Gait is intact.     Deep Tendon Reflexes: Reflexes are normal and symmetric.  Psychiatric:        Attention and Perception: Attention and perception normal.        Mood and Affect: Mood and affect normal.        Speech: Speech normal.         Behavior: Behavior normal. Behavior is cooperative.        Thought Content: Thought content normal.        Cognition and Memory: Cognition and memory normal.        Judgment: Judgment normal.     Results for orders placed or performed in visit on 07/01/22  Arthritis Panel  Result Value Ref Range   Uric Acid 9.4 (H) 3.8 - 8.4 mg/dL   Rheumatoid fact SerPl-aCnc <10.0 <14.0 IU/mL   WBC 7.8 3.4 - 10.8 x10E3/uL   RBC 4.30 4.14 - 5.80 x10E6/uL   Hemoglobin 13.7 13.0 - 17.7 g/dL   Hematocrit 30.0 92.3 - 51.0 %   MCV 97 79 - 97 fL   MCH 31.9 26.6 - 33.0 pg   MCHC 32.9 31.5 - 35.7 g/dL   RDW 30.0 76.2 - 26.3 %   Platelets 190 150 - 450 x10E3/uL   Neutrophils 75 Not Estab. %   Lymphs 13 Not Estab. %   Monocytes 9 Not Estab. %   Eos 2 Not Estab. %   Basos 1 Not Estab. %   Neutrophils Absolute 5.9 1.4 - 7.0 x10E3/uL   Lymphocytes Absolute 1.0 0.7 - 3.1 x10E3/uL   Monocytes Absolute 0.7 0.1 - 0.9 x10E3/uL   EOS (ABSOLUTE) 0.2 0.0 - 0.4 x10E3/uL   Basophils Absolute 0.1 0.0 - 0.2 x10E3/uL   Immature Granulocytes 0 Not Estab. %   Immature Grans (Abs) 0.0 0.0 - 0.1 x10E3/uL   Sed Rate 3 0 - 30 mm/hr       Pertinent labs & imaging results that were available during my care of the patient were reviewed by me and considered in my medical decision making.  Assessment & Plan:  Peirce was seen today for medical management of chronic issues.  Diagnoses and all orders for this visit:  Primary insomnia Has been doing well on below, will continue. CSA and UDS are up to date.  -     zolpidem (AMBIEN) 5 MG tablet; Take 1 tablet (5 mg total) by mouth at bedtime.     Continue all other maintenance medications.  Follow up plan: Return in about 3 months (around 11/04/2022) for chronic follow up with labs.   Continue healthy lifestyle choices, including diet (rich in fruits, vegetables, and lean proteins, and low in salt and simple carbohydrates) and exercise (at least 30 minutes of  moderate physical activity daily).  Educational handout given for insomnia  The above assessment and management plan was discussed with the patient. The patient verbalized understanding of and has agreed to the management plan. Patient is aware to call the clinic if they develop any new symptoms or if symptoms persist or worsen. Patient is aware when to return to the clinic for a follow-up visit. Patient educated on when it is appropriate to go to the emergency department.   Kari Baars, FNP-C [facility] [phone]

## 2022-08-05 LAB — CUP PACEART REMOTE DEVICE CHECK
Battery Remaining Longevity: 100 mo
Battery Remaining Percentage: 76 %
Battery Voltage: 3.01 V
Brady Statistic RV Percent Paced: 56 %
Date Time Interrogation Session: 20240528035751
Implantable Lead Connection Status: 753985
Implantable Lead Connection Status: 753985
Implantable Lead Implant Date: 19980120
Implantable Lead Implant Date: 20050119
Implantable Lead Location: 753859
Implantable Lead Location: 753860
Implantable Pulse Generator Implant Date: 20200917
Lead Channel Impedance Value: 650 Ohm
Lead Channel Pacing Threshold Amplitude: 0.75 V
Lead Channel Pacing Threshold Pulse Width: 0.4 ms
Lead Channel Sensing Intrinsic Amplitude: 12 mV
Lead Channel Setting Pacing Amplitude: 2.5 V
Lead Channel Setting Pacing Pulse Width: 0.4 ms
Lead Channel Setting Sensing Sensitivity: 2 mV
Pulse Gen Model: 2272
Pulse Gen Serial Number: 9154140

## 2022-08-10 DIAGNOSIS — X32XXXD Exposure to sunlight, subsequent encounter: Secondary | ICD-10-CM | POA: Diagnosis not present

## 2022-08-10 DIAGNOSIS — Z08 Encounter for follow-up examination after completed treatment for malignant neoplasm: Secondary | ICD-10-CM | POA: Diagnosis not present

## 2022-08-10 DIAGNOSIS — Z85828 Personal history of other malignant neoplasm of skin: Secondary | ICD-10-CM | POA: Diagnosis not present

## 2022-08-10 DIAGNOSIS — L570 Actinic keratosis: Secondary | ICD-10-CM | POA: Diagnosis not present

## 2022-08-13 ENCOUNTER — Ambulatory Visit
Admission: RE | Admit: 2022-08-13 | Discharge: 2022-08-13 | Disposition: A | Discharge status: Home / Self Care | Payer: Medicare HMO | Source: Ambulatory Visit | Attending: Orthopedic Surgery | Admitting: Orthopedic Surgery

## 2022-08-13 DIAGNOSIS — M5412 Radiculopathy, cervical region: Secondary | ICD-10-CM | POA: Diagnosis not present

## 2022-08-13 DIAGNOSIS — M542 Cervicalgia: Secondary | ICD-10-CM

## 2022-08-17 ENCOUNTER — Telehealth: Payer: Self-pay | Admitting: Orthopedic Surgery

## 2022-08-17 DIAGNOSIS — M542 Cervicalgia: Secondary | ICD-10-CM

## 2022-08-17 NOTE — Telephone Encounter (Signed)
Mr. Galentine is calling in to request the results of the CT scan he had of his neck.  Call back # is [phone].

## 2022-08-19 ENCOUNTER — Telehealth: Payer: Self-pay | Admitting: Orthopedic Surgery

## 2022-08-19 NOTE — Telephone Encounter (Signed)
Pt called about an update for a call to go over CT Scan results some time today. Please call pt at [phone].

## 2022-08-19 NOTE — Telephone Encounter (Signed)
Message already sent to Dr. August Saucer

## 2022-08-20 NOTE — Telephone Encounter (Signed)
I called.  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  Please refer him to Dr. Alvester Morin for cervical spine ESI evaluation.  He is on blood thinners.  That will have to be accommodated.  Thanks

## 2022-08-20 NOTE — Addendum Note (Signed)
Addended by: Barbette Or on: 08/20/2022 03:21 PM   Modules accepted: Orders

## 2022-08-20 NOTE — Telephone Encounter (Signed)
Referral placed in chart, FYI

## 2022-08-25 NOTE — Progress Notes (Signed)
Can you call him and tell him he has fair amount of arthritis in his neck and needs an appointment with Dr. Alvester Morin for cervical spine injection which is what we talked about last clinic visit.  Thank you

## 2022-08-27 ENCOUNTER — Telehealth: Payer: Self-pay | Admitting: *Deleted

## 2022-08-27 NOTE — Progress Notes (Signed)
Carelink Summary Report / Loop Recorder 

## 2022-08-27 NOTE — Telephone Encounter (Signed)
   Pre-operative Risk Assessment    Patient Name: Peter Howe  DOB: [birth year] MRN: [id number]      Request for Surgical Clearance    Procedure:   ESI (EPIDURAL STEROID INJECTION)  Date of Surgery:  Clearance TBD                                 Surgeon:  DR. Jerel Shepherd Group or Practice Name:  [facility] Phone number:  [phone] Fax number:  [phone]   Type of Clearance Requested:   - Medical  - Pharmacy:  Hold Rivaroxaban (Xarelto) x 2 DAYS PRIOR   Type of Anesthesia:  Not Indicated   Additional requests/questions:    Elpidio Anis   08/27/2022, 2:12 PM

## 2022-08-31 ENCOUNTER — Telehealth: Payer: Self-pay | Admitting: Physical Medicine and Rehabilitation

## 2022-08-31 ENCOUNTER — Telehealth: Payer: Self-pay

## 2022-08-31 NOTE — Telephone Encounter (Signed)
  Patient Consent for Virtual Visit        Peter Howe has provided verbal consent on 08/31/2022 for a virtual visit (video or telephone).   CONSENT FOR VIRTUAL VISIT FOR:  Peter Howe  By participating in this virtual visit I agree to the following:  I hereby voluntarily request, consent and authorize [facility] and its employed or contracted physicians, physician assistants, nurse practitioners or other licensed health care professionals (the Practitioner), to provide me with telemedicine health care services (the "Services") as deemed necessary by the treating Practitioner. I acknowledge and consent to receive the Services by the Practitioner via telemedicine. I understand that the telemedicine visit will involve communicating with the Practitioner through live audiovisual communication technology and the disclosure of certain medical information by electronic transmission. I acknowledge that I have been given the opportunity to request an in-person assessment or other available alternative prior to the telemedicine visit and am voluntarily participating in the telemedicine visit.  I understand that I have the right to withhold or withdraw my consent to the use of telemedicine in the course of my care at any time, without affecting my right to future care or treatment, and that the Practitioner or I may terminate the telemedicine visit at any time. I understand that I have the right to inspect all information obtained and/or recorded in the course of the telemedicine visit and may receive copies of available information for a reasonable fee.  I understand that some of the potential risks of receiving the Services via telemedicine include:  Delay or interruption in medical evaluation due to technological equipment failure or disruption; Information transmitted may not be sufficient (e.g. poor resolution of images) to allow for appropriate medical decision making by the  Practitioner; and/or  In rare instances, security protocols could fail, causing a breach of personal health information.  Furthermore, I acknowledge that it is my responsibility to provide information about my medical history, conditions and care that is complete and accurate to the best of my ability. I acknowledge that Practitioner's advice, recommendations, and/or decision may be based on factors not within their control, such as incomplete or inaccurate data provided by me or distortions of diagnostic images or specimens that may result from electronic transmissions. I understand that the practice of medicine is not an exact science and that Practitioner makes no warranties or guarantees regarding treatment outcomes. I acknowledge that a copy of this consent can be made available to me via my patient portal [facility] MyChart), or I can request a printed copy by calling the office of [facility].    I understand that my insurance will be billed for this visit.   I have read or had this consent read to me. I understand the contents of this consent, which adequately explains the benefits and risks of the Services being provided via telemedicine.  I have been provided ample opportunity to ask questions regarding this consent and the Services and have had my questions answered to my satisfaction. I give my informed consent for the services to be provided through the use of telemedicine in my medical care

## 2022-08-31 NOTE — Telephone Encounter (Signed)
I left a message for the patient to call our office to schedule a tele visit for pre-op clearance.  

## 2022-08-31 NOTE — Telephone Encounter (Signed)
Patient with diagnosis of atrial fibrillation on Xarelto for anticoagulation.    Procedure: ESI Date of procedure: TBD   CHA2DS2-VASc Score = 3   This indicates a 3.2% annual risk of stroke. The patient's score is based upon: CHF History: 0 HTN History: 1 Diabetes History: 0 Stroke History: 0 Vascular Disease History: 0 Age Score: 2 Gender Score: 0    CrCl 74 Platelet count 191  Per office protocol, patient can hold Xarelto for 3 days prior to procedure.    Patient will not need bridging with Lovenox (enoxaparin) around procedure.  **This guidance is not considered finalized until pre-operative APP has relayed final recommendations.**

## 2022-08-31 NOTE — Telephone Encounter (Signed)
Patient called. Would like an appointment with Dr. Newton 

## 2022-08-31 NOTE — Telephone Encounter (Signed)
Spoke with patient who is agreeable to do a tele visit on 7/2 at  9:20 am. Med rec and consent done.

## 2022-08-31 NOTE — Telephone Encounter (Signed)
   Name: Peter Howe  DOB: [birth year]  MRN: [id number]  Primary Cardiologist: None   Preoperative team, please contact this patient and set up a phone call appointment for further preoperative risk assessment. Please obtain consent and complete medication review. Thank you for your help.  I confirm that guidance regarding antiplatelet and oral anticoagulation therapy has been completed and, if necessary, noted below.  Pharmacy has provided recommendations for holding anticoagulation.   Ronney Asters, NP 08/31/2022, 10:55 AM [facility]

## 2022-09-01 NOTE — Telephone Encounter (Signed)
Spoke with patient and scheduled injection for 09/21/22. Patient aware driver needed Patient is scheduled with [facility] on 09/08/22 to discontinue Xarelto

## 2022-09-03 ENCOUNTER — Ambulatory Visit (INDEPENDENT_AMBULATORY_CARE_PROVIDER_SITE_OTHER): Payer: Medicare HMO | Admitting: Family Medicine

## 2022-09-03 ENCOUNTER — Encounter: Payer: Self-pay | Admitting: Family Medicine

## 2022-09-03 VITALS — BP 133/75 | HR 88 | Temp 97.6°F | Ht 64.0 in | Wt 176.2 lb

## 2022-09-03 DIAGNOSIS — M25532 Pain in left wrist: Secondary | ICD-10-CM

## 2022-09-03 DIAGNOSIS — M25512 Pain in left shoulder: Secondary | ICD-10-CM

## 2022-09-03 DIAGNOSIS — G8929 Other chronic pain: Secondary | ICD-10-CM

## 2022-09-03 DIAGNOSIS — M542 Cervicalgia: Secondary | ICD-10-CM

## 2022-09-03 DIAGNOSIS — M25531 Pain in right wrist: Secondary | ICD-10-CM | POA: Diagnosis not present

## 2022-09-03 DIAGNOSIS — M25511 Pain in right shoulder: Secondary | ICD-10-CM | POA: Diagnosis not present

## 2022-09-03 MED ORDER — KETOROLAC TROMETHAMINE 30 MG/ML IJ SOLN
30.0000 mg | Freq: Once | INTRAMUSCULAR | Status: AC
Start: 2022-09-03 — End: 2022-09-03
  Administered 2022-09-03: 30 mg via INTRAMUSCULAR

## 2022-09-03 MED ORDER — PREDNISONE 10 MG (21) PO TBPK
ORAL_TABLET | ORAL | 0 refills | Status: DC
Start: 2022-09-03 — End: 2022-11-04

## 2022-09-03 NOTE — Progress Notes (Addendum)
Subjective:  Patient ID: Peter Howe, male    DOB: January 07, 1941, 82 y.o.   MRN: [id number]  Patient Care Team: Sonny Masters, FNP as PCP - General (Family Medicine) Lanier Prude, MD as PCP - Electrophysiology (Cardiology) Tressia Danas, MD as Consulting Physician (Gastroenterology) Franky Macho, MD as Consulting Physician (General Surgery) Valeria Batman, MD (Inactive) as Consulting Physician (Orthopedic Surgery) Nita Sells, MD (Dermatology) Delora Fuel, [place] (Optometry)   Chief Complaint:  Arthritis (Patient states that he sees Dr. Alvester Morin July 15th and needs help in the mean time )   HPI: Peter Howe is a 82 y.o. male presenting on 09/03/2022 for Arthritis (Patient states that he sees Dr. Alvester Morin July 15th and needs help in the mean time )   Arthritis Presents for follow-up visit. He complains of pain, stiffness, joint swelling and joint warmth. Affected locations include the neck, right shoulder, left shoulder, right wrist and left wrist. His pain is at a severity of 8/10. Associated symptoms include pain at night and pain while resting. Pertinent negatives include no diarrhea, dry eyes, dry mouth, dysuria, fatigue, fever, rash, Raynaud's syndrome, uveitis or weight loss.  Has been evaluated by ortho and is scheduled to have injections 07/15. States the pain in his wrists and hands have worsened. He would like something to help with his increased pain.   Relevant past medical, surgical, family, and social history reviewed and updated as indicated.  Allergies and medications reviewed and updated. Data reviewed: Chart in Epic.   Past Medical History:  Diagnosis Date   Anxiety    on meds   Aortic atherosclerosis (HCC)    Arthritis    generalized   Bilateral inguinal hernia 01/10/2021   Cancer (HCC)    skin cancers removed   Diverticulosis 01/10/2021   GERD (gastroesophageal reflux disease)    on meds   Hyperlipidemia    on meds   Hypertension     on meds   Overweight(278.02)    obesity   Permanent atrial fibrillation (HCC)    Seasonal allergies    Tachycardia-bradycardia (HCC)    s/p PPM    Past Surgical History:  Procedure Laterality Date   ARM SKIN LESION BIOPSY / EXCISION Left    Melanoma -removed by Dr. Lurline Hare TUNNEL RELEASE Left 2022   PACEMAKER INSERTION  03/28/2003   St. 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  Tobacco Use   Smoking status: Former    Packs/day: 1.00    Years: 30.00    Additional pack years: 0.00    Total pack years: 30.00    Types: Cigarettes    Start date: 03/30/1955    Quit date: 03/09/1993    Years since quitting: 29.5    Passive exposure: Past   Smokeless tobacco: Never  Vaping Use   Vaping Use: Never used  Substance and Sexual Activity   Alcohol use: Yes    Alcohol/week: 7.0 standard drinks of alcohol    Types: 1 Glasses  of wine, 4 Cans of beer, 2 Shots of liquor per week   Drug use: No   Sexual activity: Not Currently    Partners: Female  Other Topics Concern   Not on file  Social History Narrative   Owns a Materials engineer, enjoys golf.   Lives in [place]   Brother lives 5 miles away   Social Determinants of Health   Financial Resource Strain: Low Risk  (06/24/2022)   Overall Financial Resource Strain (CARDIA)    Difficulty of Paying Living Expenses: Not hard at all  Food Insecurity: No Food Insecurity (06/24/2022)   Hunger Vital Sign    Worried About Running Out of Food in the Last Year: Never true    Ran Out of Food in the Last Year: Never  true  Transportation Needs: No Transportation Needs (06/24/2022)   PRAPARE - Administrator, Civil Service (Medical): No    Lack of Transportation (Non-Medical): No  Physical Activity: Sufficiently Active (06/24/2022)   Exercise Vital Sign    Days of Exercise per Week: 5 days    Minutes of Exercise per Session: 60 min  Stress: No Stress Concern Present (06/24/2022)   [employer] of Occupational Health - Occupational Stress Questionnaire    Feeling of Stress : Not at all  Social Connections: Moderately Integrated (06/24/2022)   Social Connection and Isolation Panel [NHANES]    Frequency of Communication with Friends and Family: More than three times a week    Frequency of Social Gatherings with Friends and Family: More than three times a week    Attends Religious Services: More than 4 times per year    Active Member of [employer] or Organizations: Yes    Attends Banker Meetings: More than 4 times per year    Marital Status: Widowed  Intimate Partner Violence: Not At Risk (06/24/2022)   Humiliation, Afraid, Rape, and Kick questionnaire    Fear of Current or Ex-Partner: No    Emotionally Abused: No    Physically Abused: No    Sexually Abused: No    Outpatient Encounter Medications as of 09/03/2022  Medication Sig   acetaminophen (TYLENOL) 325 MG tablet Take 650 mg by mouth every 6 (six) hours as needed for mild pain.   atenolol (TENORMIN) 50 MG tablet Take 1 tablet (50 mg total) by mouth daily.   colchicine 0.6 MG tablet Take 1.2 mg (2 tablets) by mouth at the first sign of a gout flare followed by 0.6 mg (1 tablet) one hour later.   levocetirizine (XYZAL) 5 MG tablet Take 1 tablet (5 mg total) by mouth every evening.   losartan (COZAAR) 50 MG tablet Take 1 tablet (50 mg total) by mouth daily.   Multiple Vitamin (MULTIVITAMIN WITH MINERALS) TABS tablet Take 1 tablet by mouth daily.   nitroGLYCERIN (NITROSTAT) 0.4 MG SL tablet Place 1 tablet under the tongue every  5 (five) minutes as needed for chest pain.   predniSONE (STERAPRED UNI-PAK 21 TAB) 10 MG (21) TBPK tablet Use as directed on back of pill pack   rivaroxaban (XARELTO) 20 MG TABS tablet Take 1 tablet (20 mg total) by mouth daily with supper.   rosuvastatin (CRESTOR) 5 MG tablet Take 1 tablet (5 mg total) by mouth once a week.   sertraline (ZOLOFT) 25 MG tablet Take 1 tablet (25 mg total) by mouth daily.   zolpidem (AMBIEN) 5 MG tablet Take 1 tablet (5 mg total) by mouth at bedtime.   No facility-administered  encounter medications on file as of 09/03/2022.    Allergies  Allergen Reactions   Allopurinol Itching   Morphine And Codeine Other (See Comments)    Patient states it wired him up and didn't help. Prefers not to take again.   Penicillins Rash    Has patient had a PCN reaction causing immediate rash, facial/tongue/throat swelling, SOB or lightheadedness with hypotension: No Has patient had a PCN reaction causing severe rash involving mucus membranes or skin necrosis: No Has patient had a PCN reaction that required hospitalization No Has patient had a PCN reaction occurring within the last 10 years: No If all of the above answers are "NO", then may proceed with Cephalosporin use.    Statins    Sulfonamide Derivatives Rash    Review of Systems  Constitutional:  Negative for fatigue, fever and weight loss.  Gastrointestinal:  Negative for diarrhea.  Genitourinary:  Negative for dysuria.  Musculoskeletal:  Positive for arthritis, joint swelling and stiffness.  Skin:  Negative for rash.        Objective:  BP 133/75   Pulse 88   Temp 97.6 F (36.4 C) (Temporal)   Ht 5\' 4"  (1.626 m)   Wt 176 lb 3.2 oz (79.9 kg)   SpO2 95%   BMI 30.24 kg/m    Wt Readings from Last 3 Encounters:  09/03/22 176 lb 3.2 oz (79.9 kg)  08/04/22 175 lb 3.2 oz (79.5 kg)  07/01/22 177 lb 12.8 oz (80.6 kg)    Physical Exam Vitals and nursing note reviewed.  Constitutional:      General: He is  not in acute distress.    Appearance: Normal appearance. He is obese. He is not ill-appearing, toxic-appearing or diaphoretic.  HENT:     Head: Normocephalic and atraumatic.     Nose: Nose normal.     Mouth/Throat:     Mouth: Mucous membranes are moist.  Eyes:     Conjunctiva/sclera: Conjunctivae normal.     Pupils: Pupils are equal, round, and reactive to light.  Cardiovascular:     Rate and Rhythm: Normal rate. Rhythm irregularly irregular.     Heart sounds: Normal heart sounds.  Pulmonary:     Effort: Pulmonary effort is normal.     Breath sounds: Normal breath sounds.  Musculoskeletal:     Right shoulder: No tenderness. Decreased range of motion.     Left shoulder: Tenderness present. Decreased range of motion.     Right upper arm: Normal.     Left upper arm: Normal.     Right forearm: Normal.     Left forearm: Normal.     Right wrist: Swelling and tenderness present. Decreased range of motion.     Left wrist: Swelling and tenderness present. Decreased range of motion.     Right hand: Swelling and tenderness present. Decreased range of motion.     Left hand: Swelling and tenderness present. Decreased range of motion.     Cervical back: Tenderness present. No swelling, edema, deformity, erythema, signs of trauma, lacerations, rigidity, spasms, torticollis, bony tenderness or crepitus. No pain with movement. Decreased range of motion.     Thoracic back: Normal.     Right lower leg: No edema.     Left lower leg: No edema.  Skin:    General: Skin is warm and dry.     Capillary Refill: Capillary refill takes less than 2 seconds.  Neurological:     General: No focal deficit present.  Mental Status: He is alert and oriented to person, place, and time.  Psychiatric:        Mood and Affect: Mood normal.        Behavior: Behavior normal.        Thought Content: Thought content normal.        Judgment: Judgment normal.     Results for orders placed or performed in visit on  08/04/22  CUP PACEART REMOTE DEVICE CHECK  Result Value Ref Range   Date Time Interrogation Session [id number]    Pulse Generator Manufacturer [employer]    Pulse Gen Model 2272 Assurity MRI    Pulse Gen Serial Number [id number]    Clinic Name [facility]    Implantable Pulse Generator Type Implantable Pulse Generator    Implantable Pulse Generator Implant Date 95621308    Implantable Lead Manufacturer [facility]    Implantable Lead Model 1646T IsoFlex S    Implantable Lead Serial Number [id number]    Implantable Lead Implant Date 65784696    Implantable Lead Location Detail 1 UNKNOWN    Implantable Lead Location [id number]    Implantable Lead Connection Status [id number]    Implantable Lead Manufacturer [employer]ACE    Implantable Lead Model 1242T Passive Plus    Implantable Lead Serial Number [id number]    Implantable Lead Implant Date 29528413    Implantable Lead Location Detail 1 UNKNOWN    Implantable Lead Location [id number]    Implantable Lead Connection Status [id number]    Lead Channel Setting Sensing Sensitivity 2.0 mV   Lead Channel Setting Sensing Adaptation Mode Fixed Pacing    Lead Channel Setting Pacing Pulse Width 0.4 ms   Lead Channel Setting Pacing Amplitude 2.5 V   Lead Channel Status NULL    Lead Channel Impedance Value 650 ohm   Lead Channel Sensing Intrinsic Amplitude 12.0 mV   Lead Channel Pacing Threshold Amplitude 0.75 V   Lead Channel Pacing Threshold Pulse Width 0.4 ms   Battery Status MOS    Battery Remaining Longevity 100 mo   Battery Remaining Percentage 76.0 %   Battery Voltage 3.01 V   Brady Statistic RV Percent Paced 56.0 %       Pertinent labs & imaging results that were available during my care of the patient were reviewed by me and considered in my medical decision making.  Assessment & Plan:  Peter Howe was seen today for arthritis.  Diagnoses and all orders for this visit:  Chronic pain of both shoulders Bilateral wrist pain Neck pain Increased pain and swelling in  bilateral wrists and hands. Will burst with antiinflammatories. IM toradol given in office. Aware to keep follow up with ortho.  -     predniSONE (STERAPRED UNI-PAK 21 TAB) 10 MG (21) TBPK tablet; Use as directed on back of pill pack     Continue all other maintenance medications.  Follow up plan: Return if symptoms worsen or fail to improve.   Continue healthy lifestyle choices, including diet (rich in fruits, vegetables, and lean proteins, and low in salt and simple carbohydrates) and exercise (at least 30 minutes of moderate physical activity daily).  Educational handout given for arthritis   The above assessment and management plan was discussed with the patient. The patient verbalized understanding of and has agreed to the management plan. Patient is aware to call the clinic if they develop any new symptoms or if symptoms persist or worsen. Patient is aware when to return to the clinic for a follow-up visit. Patient  educated on when it is appropriate to go to the emergency department.   Monia Pouch, FNP-C [facility] [phone]

## 2022-09-03 NOTE — Addendum Note (Signed)
Addended by: Lorelee Cover C on: 09/03/2022 10:05 AM   Modules accepted: Orders

## 2022-09-08 ENCOUNTER — Ambulatory Visit: Payer: Medicare HMO | Attending: Interventional Cardiology

## 2022-09-08 DIAGNOSIS — Z01810 Encounter for preprocedural cardiovascular examination: Secondary | ICD-10-CM | POA: Diagnosis not present

## 2022-09-08 NOTE — Progress Notes (Signed)
Virtual Visit via Telephone Note   Because of Peter Howe's co-morbid illnesses, he is at least at moderate risk for complications without adequate follow up.  This format is felt to be most appropriate for this patient at this time.  The patient did not have access to video technology/had technical difficulties with video requiring transitioning to audio format only (telephone).  All issues noted in this document were discussed and addressed.  No physical exam could be performed with this format.  Please refer to the patient's chart for his consent to telehealth for [facility].  Evaluation Performed:  Preoperative cardiovascular risk assessment _____________   Date:  09/08/2022   Patient ID:  Peter Howe, DOB [birth year], MRN [id number] Patient Location:  Home Provider location:   Office  Primary Care Provider:  Sonny Masters, FNP Primary Cardiologist:  None  Chief Complaint / Patient Profile   82 y.o. y/o male with a h/o  h/o sick sinus syndrome s/p PPM implant 1998, RV lead 2005, most recent generator change 2020, permanent atrial fibrillation on chronic Xarelto, HTN,  who is pending epidural ESI and presents today for telephonic preoperative cardiovascular risk assessment.  History of Present Illness    Peter Howe is a 82 y.o. male who presents via audio/video conferencing for a telehealth visit today.  Pt was last seen in cardiology clinic on 02/16/2022 by Dr. Lalla Brothers.  At that time Peter Howe was doing well with no new cardiac complaints  The patient is now pending procedure as outlined above. Since his last visit, he has been doing well with no new cardiac complaints since his previous visit.   Past Medical History    Past Medical History:  Diagnosis Date   Anxiety    on meds   Aortic atherosclerosis (HCC)    Arthritis    generalized   Bilateral inguinal hernia 01/10/2021   Cancer (HCC)    skin cancers removed   Diverticulosis 01/10/2021    GERD (gastroesophageal reflux disease)    on meds   Hyperlipidemia    on meds   Hypertension    on meds   Overweight(278.02)    obesity   Permanent atrial fibrillation (HCC)    Seasonal allergies    Tachycardia-bradycardia (HCC)    s/p PPM   Past Surgical History:  Procedure Laterality Date   ARM SKIN LESION BIOPSY / EXCISION Left    Melanoma -removed by Dr. Lurline Hare TUNNEL RELEASE Left 2022   PACEMAKER INSERTION  03/28/2003   St. Jude-PPM   PPM GENERATOR CHANGEOUT N/A 11/24/2018   Procedure: PPM GENERATOR CHANGEOUT;  Surgeon: Hillis Range, MD;  Location: [facility];  Service: Cardiovascular;  Laterality: N/A;    Allergies  Allergies  Allergen Reactions   Allopurinol Itching   Morphine And Codeine Other (See Comments)    Patient states it wired him up and didn't help. Prefers not to take again.   Penicillins Rash    Has patient had a PCN reaction causing immediate rash, facial/tongue/throat swelling, SOB or lightheadedness with hypotension: No Has patient had a PCN reaction causing severe rash involving mucus membranes or skin necrosis: No Has patient had a PCN reaction that required hospitalization No Has patient had a PCN reaction occurring within the last 10 years: No If all of the above answers are "NO", then may proceed with Cephalosporin use.    Statins    Sulfonamide Derivatives Rash    Home Medications  Prior to Admission medications   Medication Sig Start Date End Date Taking? Authorizing Provider  acetaminophen (TYLENOL) 325 MG tablet Take 650 mg by mouth every 6 (six) hours as needed for mild pain.    [provider]  atenolol (TENORMIN) 50 MG tablet Take 1 tablet (50 mg total) by mouth daily. 12/25/21   Lanier Prude, MD  colchicine 0.6 MG tablet Take 1.2 mg (2 tablets) by mouth at the first sign of a gout flare followed by 0.6 mg (1 tablet) one hour later. 10/30/21   Gwenlyn Fudge, FNP  levocetirizine (XYZAL) 5 MG tablet  Take 1 tablet (5 mg total) by mouth every evening. 07/01/22   Rakes, Doralee Albino, FNP  losartan (COZAAR) 50 MG tablet Take 1 tablet (50 mg total) by mouth daily. 04/30/22   Sonny Masters, FNP  Multiple Vitamin (MULTIVITAMIN WITH MINERALS) TABS tablet Take 1 tablet by mouth daily.    [provider]  nitroGLYCERIN (NITROSTAT) 0.4 MG SL tablet Place 1 tablet under the tongue every 5 (five) minutes as needed for chest pain. 10/01/20   Allred, Fayrene Fearing, MD  predniSONE (STERAPRED UNI-PAK 21 TAB) 10 MG (21) TBPK tablet Use as directed on back of pill pack 09/03/22   Rakes, Doralee Albino, FNP  rivaroxaban (XARELTO) 20 MG TABS tablet Take 1 tablet (20 mg total) by mouth daily with supper. 03/26/22   Lanier Prude, MD  rosuvastatin (CRESTOR) 5 MG tablet Take 1 tablet (5 mg total) by mouth once a week. 04/30/22   Sonny Masters, FNP  sertraline (ZOLOFT) 25 MG tablet Take 1 tablet (25 mg total) by mouth daily. 04/30/22   Sonny Masters, FNP  zolpidem (AMBIEN) 5 MG tablet Take 1 tablet (5 mg total) by mouth at bedtime. 08/04/22   Sonny Masters, FNP    Physical Exam    Vital Signs:  Peter Howe does not have vital signs available for review today.  Given telephonic nature of communication, physical exam is limited. AAOx3. NAD. Normal affect.  Speech and respirations are unlabored.  Accessory Clinical Findings    None  Assessment & Plan    1.  Preoperative Cardiovascular Risk Assessment:  Patient's RCRI score is 0.4%  The patient affirms he has been doing well without any new cardiac symptoms. They are able to achieve 5 METS without cardiac limitations. Therefore, based on ACC/AHA guidelines, the patient would be at acceptable risk for the planned procedure without further cardiovascular testing. The patient was advised that if he develops new symptoms prior to surgery to contact our office to arrange for a follow-up visit, and he verbalized understanding.   The patient was advised that if he  develops new symptoms prior to surgery to contact our office to arrange for a follow-up visit, and he verbalized understanding.  Per office protocol, patient can hold Xarelto for 3 days prior to procedure.    A copy of this note will be routed to requesting surgeon.  Time:   Today, I have spent 6 minutes with the patient with telehealth technology discussing medical history, symptoms, and management plan.     Napoleon Form, Leodis Rains, NP  09/08/2022, 7:01 AM

## 2022-09-21 ENCOUNTER — Ambulatory Visit: Payer: Medicare HMO | Admitting: Physical Medicine and Rehabilitation

## 2022-09-21 ENCOUNTER — Other Ambulatory Visit: Payer: Self-pay

## 2022-09-21 VITALS — BP 120/72 | HR 60

## 2022-09-21 DIAGNOSIS — M5412 Radiculopathy, cervical region: Secondary | ICD-10-CM | POA: Diagnosis not present

## 2022-09-21 MED ORDER — METHYLPREDNISOLONE ACETATE 80 MG/ML IJ SUSP
80.0000 mg | Freq: Once | INTRAMUSCULAR | Status: AC
Start: 2022-09-21 — End: 2022-09-21
  Administered 2022-09-21: 80 mg

## 2022-09-21 NOTE — Progress Notes (Signed)
Right C7- T1 interlaminal injection   Functional Pain Scale - descriptive words and definitions  Intense (8)    Cannot complete any ADLs without much assistance/cannot concentrate/conversation is difficult/unable to sleep and unable to use distraction. Severe range order  Average Pain 4   +Driver, -BT, -Dye Allergies.

## 2022-09-21 NOTE — Patient Instructions (Signed)

## 2022-10-02 NOTE — Progress Notes (Signed)
Peter Howe - 82 y.o. male MRN [id number]  Date of birth: 06-25-1940  Office Visit Note: Visit Date: 09/21/2022 PCP: Sonny Masters, FNP Referred by: Sonny Masters, FNP  Subjective: Chief Complaint  Patient presents with   Neck - Pain   HPI:  Peter Howe is a 82 y.o. male who comes in today at the request of Dr. Burnard Bunting for planned Right C7-T1 Cervical Interlaminar epidural steroid injection with fluoroscopic guidance.  The patient has failed conservative care including home exercise, medications, time and activity modification.  This injection will be diagnostic and hopefully therapeutic.  Please see requesting physician notes for further details and justification.   ROS Otherwise per HPI.  Assessment & Plan: Visit Diagnoses:    ICD-10-CM   1. Cervical radiculopathy  M54.12 XR C-ARM NO REPORT    Epidural Steroid injection    methylPREDNISolone acetate (DEPO-MEDROL) injection 80 mg      Plan: No additional findings.   Meds & Orders:  Meds ordered this encounter  Medications   methylPREDNISolone acetate (DEPO-MEDROL) injection 80 mg    Orders Placed This Encounter  Procedures   XR C-ARM NO REPORT   Epidural Steroid injection    Follow-up: Return for visit to requesting provider as needed.   Procedures: No procedures performed  Cervical Epidural Steroid Injection - Interlaminar Approach with Fluoroscopic Guidance  Patient: Peter Howe      Date of Birth: 1940/09/16 MRN: [id number] PCP: Sonny Masters, FNP      Visit Date: 09/21/2022   Universal Protocol:    Date/Time: 07/26/243:46 PM  Consent Given By: the patient  Position: PRONE  Additional Comments: Vital signs were monitored before and after the procedure. Patient was prepped and draped in the usual sterile fashion. The correct patient, procedure, and site was verified.   Injection Procedure Details:   Procedure diagnoses: Cervical radiculopathy [M54.12]    Meds Administered:   Meds ordered this encounter  Medications   methylPREDNISolone acetate (DEPO-MEDROL) injection 80 mg     Laterality: Right  Location/Site: C7-T1  Needle: 3.5 in., 20 ga. Tuohy  Needle Placement: Paramedian epidural space  Findings:  -Comments: Excellent flow of contrast into the epidural space.  Procedure Details: Using a paramedian approach from the side mentioned above, the region overlying the inferior lamina was localized under fluoroscopic visualization and the soft tissues overlying this structure were infiltrated with 4 ml. of 1% Lidocaine without Epinephrine. A # 20 gauge, Tuohy needle was inserted into the epidural space using a paramedian approach.  The epidural space was localized using loss of resistance along with contralateral oblique bi-planar fluoroscopic views.  After negative aspirate for air, blood, and CSF, a 2 ml. volume of Isovue-250 was injected into the epidural space and the flow of contrast was observed. Radiographs were obtained for documentation purposes.   The injectate was administered into the level noted above.  Additional Comments:  The patient tolerated the procedure well Dressing: 2 x 2 sterile gauze and Band-Aid    Post-procedure details: Patient was observed during the procedure. Post-procedure instructions were reviewed.  Patient left the clinic in stable condition.    Clinical History: No specialty comments available.     Objective:  VS:  HT:    WT:   BMI:     BP:120/72  HR:60bpm  TEMP: ( )  RESP:  Physical Exam Vitals and nursing note reviewed.  Constitutional:      General: He is not in acute  distress.    Appearance: Normal appearance. He is not ill-appearing.  HENT:     Head: Normocephalic and atraumatic.     Right Ear: External ear normal.     Left Ear: External ear normal.  Eyes:     Extraocular Movements: Extraocular movements intact.  Cardiovascular:     Rate and Rhythm: Normal rate.     Pulses: Normal pulses.   Abdominal:     General: There is no distension.     Palpations: Abdomen is soft.  Musculoskeletal:        General: No signs of injury.     Cervical back: Neck supple. Tenderness present. No rigidity.     Right lower leg: No edema.     Left lower leg: No edema.     Comments: Patient has good strength in the upper extremities with 5 out of 5 strength in wrist extension long finger flexion APB.  No intrinsic hand muscle atrophy.  Negative Hoffmann's test.  Lymphadenopathy:     Cervical: No cervical adenopathy.  Skin:    Findings: No erythema or rash.  Neurological:     General: No focal deficit present.     Mental Status: He is alert and oriented to person, place, and time.     Sensory: No sensory deficit.     Motor: No weakness or abnormal muscle tone.     Coordination: Coordination normal.  Psychiatric:        Mood and Affect: Mood normal.        Behavior: Behavior normal.      Imaging: No results found.

## 2022-10-02 NOTE — Procedures (Signed)
Cervical Epidural Steroid Injection - Interlaminar Approach with Fluoroscopic Guidance  Patient: Peter Howe      Date of Birth: Sep 14, 1940 MRN: [id number] PCP: Sonny Masters, FNP      Visit Date: 09/21/2022   Universal Protocol:    Date/Time: 07/26/243:46 PM  Consent Given By: the patient  Position: PRONE  Additional Comments: Vital signs were monitored before and after the procedure. Patient was prepped and draped in the usual sterile fashion. The correct patient, procedure, and site was verified.   Injection Procedure Details:   Procedure diagnoses: Cervical radiculopathy [M54.12]    Meds Administered:  Meds ordered this encounter  Medications   methylPREDNISolone acetate (DEPO-MEDROL) injection 80 mg     Laterality: Right  Location/Site: C7-T1  Needle: 3.5 in., 20 ga. Tuohy  Needle Placement: Paramedian epidural space  Findings:  -Comments: Excellent flow of contrast into the epidural space.  Procedure Details: Using a paramedian approach from the side mentioned above, the region overlying the inferior lamina was localized under fluoroscopic visualization and the soft tissues overlying this structure were infiltrated with 4 ml. of 1% Lidocaine without Epinephrine. A # 20 gauge, Tuohy needle was inserted into the epidural space using a paramedian approach.  The epidural space was localized using loss of resistance along with contralateral oblique bi-planar fluoroscopic views.  After negative aspirate for air, blood, and CSF, a 2 ml. volume of Isovue-250 was injected into the epidural space and the flow of contrast was observed. Radiographs were obtained for documentation purposes.   The injectate was administered into the level noted above.  Additional Comments:  The patient tolerated the procedure well Dressing: 2 x 2 sterile gauze and Band-Aid    Post-procedure details: Patient was observed during the procedure. Post-procedure instructions were  reviewed.  Patient left the clinic in stable condition.

## 2022-11-03 ENCOUNTER — Ambulatory Visit (INDEPENDENT_AMBULATORY_CARE_PROVIDER_SITE_OTHER): Payer: Medicare HMO

## 2022-11-03 DIAGNOSIS — I4821 Permanent atrial fibrillation: Secondary | ICD-10-CM

## 2022-11-03 LAB — CUP PACEART REMOTE DEVICE CHECK
Battery Remaining Longevity: 97 mo
Battery Remaining Percentage: 74 %
Battery Voltage: 3.01 V
Brady Statistic RV Percent Paced: 56 %
Date Time Interrogation Session: 20240827023742
Implantable Lead Connection Status: 753985
Implantable Lead Connection Status: 753985
Implantable Lead Implant Date: 19980120
Implantable Lead Implant Date: 20050119
Implantable Lead Location: 753859
Implantable Lead Location: 753860
Implantable Pulse Generator Implant Date: 20200917
Lead Channel Impedance Value: 650 Ohm
Lead Channel Pacing Threshold Amplitude: 0.75 V
Lead Channel Pacing Threshold Pulse Width: 0.4 ms
Lead Channel Sensing Intrinsic Amplitude: 12 mV
Lead Channel Setting Pacing Amplitude: 2.5 V
Lead Channel Setting Pacing Pulse Width: 0.4 ms
Lead Channel Setting Sensing Sensitivity: 2 mV
Pulse Gen Model: 2272
Pulse Gen Serial Number: 9154140

## 2022-11-04 ENCOUNTER — Encounter: Payer: Self-pay | Admitting: Family Medicine

## 2022-11-04 ENCOUNTER — Ambulatory Visit (INDEPENDENT_AMBULATORY_CARE_PROVIDER_SITE_OTHER): Payer: Medicare HMO | Admitting: Family Medicine

## 2022-11-04 VITALS — BP 137/76 | HR 93 | Temp 97.7°F | Ht 64.0 in | Wt 174.6 lb

## 2022-11-04 DIAGNOSIS — I700 Atherosclerosis of aorta: Secondary | ICD-10-CM | POA: Diagnosis not present

## 2022-11-04 DIAGNOSIS — F5101 Primary insomnia: Secondary | ICD-10-CM

## 2022-11-04 DIAGNOSIS — I10 Essential (primary) hypertension: Secondary | ICD-10-CM | POA: Diagnosis not present

## 2022-11-04 DIAGNOSIS — Z79899 Other long term (current) drug therapy: Secondary | ICD-10-CM | POA: Diagnosis not present

## 2022-11-04 DIAGNOSIS — K219 Gastro-esophageal reflux disease without esophagitis: Secondary | ICD-10-CM

## 2022-11-04 DIAGNOSIS — I4821 Permanent atrial fibrillation: Secondary | ICD-10-CM

## 2022-11-04 DIAGNOSIS — F410 Panic disorder [episodic paroxysmal anxiety] without agoraphobia: Secondary | ICD-10-CM | POA: Diagnosis not present

## 2022-11-04 DIAGNOSIS — I739 Peripheral vascular disease, unspecified: Secondary | ICD-10-CM | POA: Diagnosis not present

## 2022-11-04 MED ORDER — ROSUVASTATIN CALCIUM 5 MG PO TABS
5.0000 mg | ORAL_TABLET | ORAL | 3 refills | Status: DC
Start: 2022-11-04 — End: 2023-10-07

## 2022-11-04 MED ORDER — SERTRALINE HCL 25 MG PO TABS: 25.0000 mg | ORAL_TABLET | Freq: Every day | ORAL | 1 refills | Status: DC

## 2022-11-04 MED ORDER — LOSARTAN POTASSIUM 50 MG PO TABS: 50.0000 mg | ORAL_TABLET | Freq: Every day | ORAL | 1 refills | Status: DC

## 2022-11-04 MED ORDER — ZOLPIDEM TARTRATE 5 MG PO TABS
5.0000 mg | ORAL_TABLET | Freq: Every day | ORAL | 3 refills | Status: DC
Start: 2022-11-04 — End: 2023-03-05

## 2022-11-04 NOTE — Progress Notes (Signed)
Subjective:  Patient ID: Peter Howe, male    DOB: [birth year], 82 y.o.   MRN: [id number]  Patient Care Team: Sonny Masters, FNP as PCP - General (Family Medicine) Lanier Prude, MD as PCP - Electrophysiology (Cardiology) Tressia Danas, MD (Inactive) as Consulting Physician (Gastroenterology) Franky Macho, MD as Consulting Physician (General Surgery) Valeria Batman, MD (Inactive) as Consulting Physician (Orthopedic Surgery) Nita Sells, MD (Dermatology) Delora Fuel, OD (Optometry)   Chief Complaint:  Medical Management of Chronic Issues (3 month follow up )   HPI: Peter Howe is a 82 y.o. male presenting on 11/04/2022 for Medical Management of Chronic Issues (3 month follow up )   1. Essential hypertension with goal blood pressure less than 130/80 Complaint with meds - Yes Current Medications - atenolol, losartan Checking BP at home - no Exercising Regularly - No Watching Salt intake - Yes Pertinent ROS:  Headache - No Fatigue - No Visual Disturbances - No Chest pain - No Dyspnea - No Palpitations - No LE edema - No They report good compliance with medications and can restate their regimen by memory. No medication side effects.  BP Readings from Last 3 Encounters:  11/04/22 137/76  09/21/22 120/72  09/03/22 133/75    2. Permanent atrial fibrillation (HCC) Followed by cardiology on a regular basis. Denies palpitations, shortness of breath, palpitations, weakness, or confusion. No abnormal bleeding or bruising with the Xarelto. Rate is controlled.   3. PVD (peripheral vascular disease) (HCC) He is statin therapy. Denies any claudication. No discoloration or weakness in extremities.  4. Gastroesophageal reflux disease without esophagitis This is controlled with watching his diet. Denies recent symptoms.   5. Aortic atherosclerosis (HCC) No anginal symptoms. Taking statin therapy as prescribed and tolerating well.   6. Panic  attacks States he feels he is doing well on the sertraline. Feels this has helped with his panic.     11/04/2022    8:07 AM 06/24/2022    9:33 AM 04/30/2022    9:54 AM 04/15/2022    3:34 PM 03/10/2022   11:39 AM  Depression screen PHQ 2/9  Decreased Interest 0 0 0 0 0  Down, Depressed, Hopeless 0 0 0 0 0  PHQ - 2 Score 0 0 0 0 0  Altered sleeping 0 0 0    Tired, decreased energy 0 0 3    Change in appetite 0 0 0    Feeling bad or failure about yourself  0 0 0    Trouble concentrating 0 0 0    Moving slowly or fidgety/restless 0 0 0    Suicidal thoughts 0 0 0    PHQ-9 Score 0 0 3    Difficult doing work/chores Not difficult at all Not difficult at all Not difficult at all        11/04/2022    8:07 AM 04/30/2022    9:55 AM 10/30/2021    9:13 AM 04/25/2021    8:57 AM  GAD 7 : Generalized Anxiety Score  Nervous, Anxious, on Edge 0 0 0 0  Control/stop worrying 0 0 0 0  Worry too much - different things 0 0 0 0  Trouble relaxing 0 0 0 0  Restless 0 0 0 0  Easily annoyed or irritable 0 0 0 0  Afraid - awful might happen 0 0 0 0  Total GAD 7 Score 0 0 0 0  Anxiety Difficulty Not difficult at all Not difficult at all  Not difficult at all Not difficult at all    7. Primary insomnia Pt has been taking 2.5-5 mg of Ambien and this works very well for him. Denies abnormal dreams, sleep walking, or daytime malaise.  PDMP reviewed and no red flags present. CSA updated today.     Relevant past medical, surgical, family, and social history reviewed and updated as indicated.  Allergies and medications reviewed and updated. Data reviewed: Chart in Epic.   Past Medical History:  Diagnosis Date   Anxiety    on meds   Aortic atherosclerosis (HCC)    Arthritis    generalized   Bilateral inguinal hernia 01/10/2021   Cancer (HCC)    skin cancers removed   Diverticulosis 01/10/2021   GERD (gastroesophageal reflux disease)    on meds   Hyperlipidemia    on meds   Hypertension    on meds    Overweight(278.02)    obesity   Permanent atrial fibrillation (HCC)    Seasonal allergies    Tachycardia-bradycardia (HCC)    s/p PPM    Past Surgical History:  Procedure Laterality Date   ARM SKIN LESION BIOPSY / EXCISION Left    Melanoma -removed by Dr. Lurline Hare TUNNEL RELEASE Left 2022   PACEMAKER INSERTION  03/28/2003   St. 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  Tobacco Use   Smoking status: Former    Current packs/day: 0.00    Average packs/day: 1 pack/day for 37.9 years (37.9 ttl pk-yrs)    Types: Cigarettes    Start date: 03/30/1955    Quit date: 03/09/1993    Years since quitting: 29.6    Passive exposure: Past   Smokeless tobacco: Never  Vaping Use   Vaping status: Never Used  Substance and Sexual Activity   Alcohol use: Yes    Alcohol/week: 7.0 standard drinks of alcohol    Types: 1 Glasses of wine, 4 Cans of beer, 2 Shots of liquor per week   Drug use: No   Sexual activity: Not Currently    Partners: Female  Other Topics Concern   Not on file  Social History Narrative   Owns a Materials engineer, enjoys golf.   Lives in [place]   Brother lives 5 miles away   Social Determinants of Health   Financial Resource Strain: Low Risk  (06/24/2022)   Overall Financial Resource Strain (CARDIA)    Difficulty of Paying Living Expenses: Not hard at all  Food Insecurity: No Food Insecurity (06/24/2022)   Hunger Vital Sign    Worried About Running Out of Food in the Last Year: Never true    Ran Out of Food in the Last Year: Never true   Transportation Needs: No Transportation Needs (06/24/2022)   PRAPARE - Administrator, Civil Service (Medical): No    Lack of Transportation (Non-Medical): No  Physical Activity: Sufficiently Active (06/24/2022)   Exercise Vital Sign    Days of Exercise per Week: 5 days    Minutes of  Exercise per Session: 60 min  Stress: No Stress Concern Present (06/24/2022)   [employer] of Occupational Health - Occupational Stress Questionnaire    Feeling of Stress : Not at all  Social Connections: Moderately Integrated (06/24/2022)   Social Connection and Isolation Panel [NHANES]    Frequency of Communication with Friends and Family: More than three times a week    Frequency of Social Gatherings with Friends and Family: More than three times a week    Attends Religious Services: More than 4 times per year    Active Member of [employer] or Organizations: Yes    Attends Banker Meetings: More than 4 times per year    Marital Status: Widowed  Intimate Partner Violence: Not At Risk (06/24/2022)   Humiliation, Afraid, Rape, and Kick questionnaire    Fear of Current or Ex-Partner: No    Emotionally Abused: No    Physically Abused: No    Sexually Abused: No    Outpatient Encounter Medications as of 11/04/2022  Medication Sig   acetaminophen (TYLENOL) 325 MG tablet Take 650 mg by mouth every 6 (six) hours as needed for mild pain.   atenolol (TENORMIN) 50 MG tablet Take 1 tablet (50 mg total) by mouth daily.   colchicine 0.6 MG tablet Take 1.2 mg (2 tablets) by mouth at the first sign of a gout flare followed by 0.6 mg (1 tablet) one hour later.   levocetirizine (XYZAL) 5 MG tablet Take 1 tablet (5 mg total) by mouth every evening.   Multiple Vitamin (MULTIVITAMIN WITH MINERALS) TABS tablet Take 1 tablet by mouth daily.   nitroGLYCERIN (NITROSTAT) 0.4 MG SL tablet Place 1 tablet under the tongue every 5 (five) minutes as needed for chest pain.   rivaroxaban (XARELTO) 20 MG TABS  tablet Take 1 tablet (20 mg total) by mouth daily with supper.   [DISCONTINUED] losartan (COZAAR) 50 MG tablet Take 1 tablet (50 mg total) by mouth daily.   [DISCONTINUED] rosuvastatin (CRESTOR) 5 MG tablet Take 1 tablet (5 mg total) by mouth once a week.   [DISCONTINUED] sertraline (ZOLOFT) 25 MG tablet Take 1 tablet (25 mg total) by mouth daily.   [DISCONTINUED] zolpidem (AMBIEN) 5 MG tablet Take 1 tablet (5 mg total) by mouth at bedtime.   losartan (COZAAR) 50 MG tablet Take 1 tablet (50 mg total) by mouth daily.   rosuvastatin (CRESTOR) 5 MG tablet Take 1 tablet (5 mg total) by mouth once a week.   sertraline (ZOLOFT) 25 MG tablet Take 1 tablet (25 mg total) by mouth daily.   zolpidem (AMBIEN) 5 MG tablet Take 1 tablet (5 mg total) by mouth at bedtime.   [DISCONTINUED] predniSONE (STERAPRED UNI-PAK 21 TAB) 10 MG (21) TBPK tablet Use as directed on back of pill pack   No facility-administered encounter medications on file as of 11/04/2022.    Allergies  Allergen Reactions   Allopurinol Itching   Morphine And Codeine Other (See Comments)    Patient states it wired him up and didn't help. Prefers not to take again.   Penicillins Rash    Has patient had a PCN reaction causing immediate rash, facial/tongue/throat swelling, SOB or lightheadedness with hypotension: No Has patient had a PCN reaction causing severe rash involving mucus membranes or skin necrosis: No Has patient had a PCN reaction that required hospitalization No Has patient had a PCN reaction occurring within the last 10 years: No If all of the above answers are "NO", then may proceed with  Cephalosporin use.    Statins    Sulfonamide Derivatives Rash    Review of Systems  Constitutional:  Negative for activity change, appetite change, chills, diaphoresis, fatigue, fever and unexpected weight change.  HENT: Negative.    Eyes: Negative.  Negative for photophobia and visual disturbance.  Respiratory:  Negative for cough,  chest tightness and shortness of breath.   Cardiovascular:  Negative for chest pain, palpitations and leg swelling.  Gastrointestinal:  Negative for abdominal pain, blood in stool, constipation, diarrhea, nausea and vomiting.  Endocrine: Negative.  Negative for polydipsia, polyphagia and polyuria.  Genitourinary:  Negative for decreased urine volume, difficulty urinating, dysuria, frequency and urgency.  Musculoskeletal:  Positive for arthralgias and joint swelling. Negative for myalgias.  Skin: Negative.   Allergic/Immunologic: Negative.   Neurological:  Negative for dizziness, tremors, seizures, syncope, facial asymmetry, speech difficulty, weakness, light-headedness, numbness and headaches.  Hematological: Negative.  Does not bruise/bleed easily.  Psychiatric/Behavioral:  Positive for sleep disturbance. Negative for agitation, behavioral problems, confusion, decreased concentration, dysphoric mood, hallucinations, self-injury and suicidal ideas. The patient is not nervous/anxious and is not hyperactive.   All other systems reviewed and are negative.       Objective:  BP 137/76   Pulse 93   Temp 97.7 F (36.5 C) (Temporal)   Ht 5\' 4"  (1.626 m)   Wt 174 lb 9.6 oz (79.2 kg)   SpO2 95%   BMI 29.97 kg/m    Wt Readings from Last 3 Encounters:  11/04/22 174 lb 9.6 oz (79.2 kg)  09/03/22 176 lb 3.2 oz (79.9 kg)  08/04/22 175 lb 3.2 oz (79.5 kg)    Physical Exam Vitals reviewed.  Constitutional:      General: He is not in acute distress.    Appearance: Normal appearance. He is obese. He is not ill-appearing, toxic-appearing or diaphoretic.  HENT:     Head: Normocephalic and atraumatic.     Mouth/Throat:     Mouth: Mucous membranes are moist.  Eyes:     Conjunctiva/sclera: Conjunctivae normal.     Pupils: Pupils are equal, round, and reactive to light.  Cardiovascular:     Rate and Rhythm: Normal rate. Rhythm irregularly irregular.     Heart sounds: Normal heart sounds. No  murmur heard.    No friction rub. No gallop.  Pulmonary:     Effort: Pulmonary effort is normal.     Breath sounds: Normal breath sounds.  Chest:     Comments: Pacemaker pocket well healed Musculoskeletal:     Right forearm: Normal.     Left forearm: Normal.     Right wrist: Swelling and tenderness present. Decreased range of motion.     Left wrist: Swelling and tenderness present. Decreased range of motion.     Right hand: Normal.     Left hand: Normal.     Cervical back: Normal range of motion and neck supple.  Skin:    General: Skin is warm and dry.     Capillary Refill: Capillary refill takes less than 2 seconds.  Neurological:     General: No focal deficit present.     Mental Status: He is alert and oriented to person, place, and time.  Psychiatric:        Mood and Affect: Mood normal.        Behavior: Behavior normal.        Thought Content: Thought content normal.        Judgment: Judgment normal.  Results for orders placed or performed in visit on 11/03/22  CUP PACEART REMOTE DEVICE CHECK  Result Value Ref Range   Date Time Interrogation Session [id number]    Pulse Generator Manufacturer [employer]    Pulse Gen Model 2272 Assurity MRI    Pulse Gen Serial Number [id number]    Clinic Name [facility]    Implantable Pulse Generator Type Implantable Pulse Generator    Implantable Pulse Generator Implant Date 59563875    Implantable Lead Manufacturer [facility]    Implantable Lead Model 1646T IsoFlex S    Implantable Lead Serial Number [id number]    Implantable Lead Implant Date 64332951    Implantable Lead Location Detail 1 UNKNOWN    Implantable Lead Location [id number]    Implantable Lead Connection Status [id number]    Implantable Lead Manufacturer [employer]    Implantable Lead Model 1242T Passive Plus    Implantable Lead Serial Number [id number]    Implantable Lead Implant Date 88416606    Implantable Lead Location Detail 1 UNKNOWN    Implantable Lead Location [id number]     Implantable Lead Connection Status [id number]    Lead Channel Setting Sensing Sensitivity 2.0 mV   Lead Channel Setting Sensing Adaptation Mode Fixed Pacing    Lead Channel Setting Pacing Pulse Width 0.4 ms   Lead Channel Setting Pacing Amplitude 2.5 V   Lead Channel Status NULL    Lead Channel Impedance Value 650 ohm   Lead Channel Sensing Intrinsic Amplitude 12.0 mV   Lead Channel Pacing Threshold Amplitude 0.75 V   Lead Channel Pacing Threshold Pulse Width 0.4 ms   Battery Status MOS    Battery Remaining Longevity 97 mo   Battery Remaining Percentage 74.0 %   Battery Voltage 3.01 V   Brady Statistic RV Percent Paced 56.0 %       Pertinent labs & imaging results that were available during my care of the patient were reviewed by me and considered in my medical decision making.  Assessment & Plan:  Jshawn was seen today for medical management of chronic issues.  Diagnoses and all orders for this visit:  Permanent atrial fibrillation (HCC) Rate controlled on Xarelto. Followed by cardiology on a regular basis.  -     CBC with Differential/Platelet -     CMP14+EGFR -     Lipid panel  Essential hypertension with goal blood pressure less than 130/80 BP well controlled. Changes were not made in regimen today. Goal BP is 130/80. Pt aware to report any persistent high or low readings. DASH diet and exercise encouraged. Exercise at least 150 minutes per week and increase as tolerated. Goal BMI > 25. Stress management encouraged. Avoid nicotine and tobacco product use. Avoid excessive alcohol and NSAID's. Avoid more than 2000 mg of sodium daily. Medications as prescribed. Follow up as scheduled.  -     losartan (COZAAR) 50 MG tablet; Take 1 tablet (50 mg total) by mouth daily. -     CBC with Differential/Platelet -     CMP14+EGFR -     Lipid panel -     Thyroid Panel With TSH  PVD (peripheral vascular disease) (HCC) Denies claudication symptoms. Continue statin therapy.  -      rosuvastatin (CRESTOR) 5 MG tablet; Take 1 tablet (5 mg total) by mouth once a week. -     Lipid panel  Gastroesophageal reflux disease without esophagitis No red flags present. Diet discussed. Avoid fried, spicy, fatty, greasy, and acidic foods. Avoid caffeine, nicotine, and  alcohol. Do not eat 2-3 hours before bedtime and stay upright for at least 1-2 hours after eating. Eat small frequent meals. Avoid NSAID's like motrin and aleve. Report any new or worsening symptoms. Follow up as discussed or sooner if needed.   -     CBC with Differential/Platelet  Aortic atherosclerosis (HCC) No anginal symptoms. On statin therapy.  -     rosuvastatin (CRESTOR) 5 MG tablet; Take 1 tablet (5 mg total) by mouth once a week. -     CMP14+EGFR -     Lipid panel  Panic attacks Doing well on below, will conitnue.  -     sertraline (ZOLOFT) 25 MG tablet; Take 1 tablet (25 mg total) by mouth daily. -     Thyroid Panel With TSH  Primary insomnia CSA signed Drug screen and CSA updated today. Medications as prescribed. Sleep hygiene discussed in detail. -     zolpidem (AMBIEN) 5 MG tablet; Take 1 tablet (5 mg total) by mouth at bedtime. -     Thyroid Panel With TSH -     ToxASSURE Select 13 (MW), Urine  Continue all other maintenance medications.  Follow up plan: Return in about 4 months (around 03/06/2023), or if symptoms worsen or fail to improve, for insomnia .   Continue healthy lifestyle choices, including diet (rich in fruits, vegetables, and lean proteins, and low in salt and simple carbohydrates) and exercise (at least 30 minutes of moderate physical activity daily).  Educational handout given for arthritis   The above assessment and management plan was discussed with the patient. The patient verbalized understanding of and has agreed to the management plan. Patient is aware to call the clinic if they develop any new symptoms or if symptoms persist or worsen. Patient is aware when to return to  the clinic for a follow-up visit. Patient educated on when it is appropriate to go to the emergency department.   Kari Baars, FNP-C [facility] [phone]

## 2022-11-05 ENCOUNTER — Ambulatory Visit: Payer: Medicare HMO | Admitting: Orthopedic Surgery

## 2022-11-05 ENCOUNTER — Encounter: Payer: Self-pay | Admitting: Orthopedic Surgery

## 2022-11-05 DIAGNOSIS — M542 Cervicalgia: Secondary | ICD-10-CM | POA: Diagnosis not present

## 2022-11-05 LAB — CMP14+EGFR
ALT: 24 IU/L (ref 0–44)
AST: 27 IU/L (ref 0–40)
Albumin: 3.8 g/dL (ref 3.7–4.7)
Alkaline Phosphatase: 85 IU/L (ref 44–121)
BUN/Creatinine Ratio: 15 (ref 10–24)
BUN: 15 mg/dL (ref 8–27)
Bilirubin Total: 0.5 mg/dL (ref 0.0–1.2)
CO2: 23 mmol/L (ref 20–29)
Calcium: 9.2 mg/dL (ref 8.6–10.2)
Chloride: 101 mmol/L (ref 96–106)
Creatinine, Ser: 1 mg/dL (ref 0.76–1.27)
Globulin, Total: 2.4 g/dL (ref 1.5–4.5)
Glucose: 87 mg/dL (ref 70–99)
Potassium: 4.5 mmol/L (ref 3.5–5.2)
Sodium: 139 mmol/L (ref 134–144)
Total Protein: 6.2 g/dL (ref 6.0–8.5)
eGFR: 75 mL/min/{1.73_m2} (ref >59)

## 2022-11-05 LAB — LIPID PANEL
Chol/HDL Ratio: 1.8 ratio (ref 0.0–5.0)
Cholesterol, Total: 167 mg/dL (ref 100–199)
HDL: 94 mg/dL (ref >39)
LDL Chol Calc (NIH): 60 mg/dL (ref 0–99)
Triglycerides: 67 mg/dL (ref 0–149)
VLDL Cholesterol Cal: 13 mg/dL (ref 5–40)

## 2022-11-05 LAB — CBC WITH DIFFERENTIAL/PLATELET
Basophils Absolute: 0.1 10*3/uL (ref 0.0–0.2)
Basos: 1 %
EOS (ABSOLUTE): 0.2 10*3/uL (ref 0.0–0.4)
Eos: 2 %
Hematocrit: 39.6 % (ref 37.5–51.0)
Hemoglobin: 13 g/dL (ref 13.0–17.7)
Immature Grans (Abs): 0 10*3/uL (ref 0.0–0.1)
Immature Granulocytes: 0 %
Lymphocytes Absolute: 1.5 10*3/uL (ref 0.7–3.1)
Lymphs: 16 %
MCH: 31 pg (ref 26.6–33.0)
MCHC: 32.8 g/dL (ref 31.5–35.7)
MCV: 95 fL (ref 79–97)
Monocytes Absolute: 0.8 10*3/uL (ref 0.1–0.9)
Monocytes: 9 %
Neutrophils Absolute: 6.4 10*3/uL (ref 1.4–7.0)
Neutrophils: 72 %
Platelets: 267 10*3/uL (ref 150–450)
RBC: 4.19 x10E6/uL (ref 4.14–5.80)
RDW: 14.3 % (ref 11.6–15.4)
WBC: 8.9 10*3/uL (ref 3.4–10.8)

## 2022-11-05 LAB — THYROID PANEL WITH TSH
Free Thyroxine Index: 1.3 (ref 1.2–4.9)
T3 Uptake Ratio: 29 % (ref 24–39)
T4, Total: 4.6 ug/dL (ref 4.5–12.0)
TSH: 1.64 u[IU]/mL (ref 0.450–4.500)

## 2022-11-05 MED ORDER — GABAPENTIN 100 MG PO CAPS: 100.0000 mg | ORAL_CAPSULE | Freq: Two times a day (BID) | ORAL | 0 refills | Status: DC

## 2022-11-05 NOTE — Progress Notes (Signed)
Office Visit Note   Patient: Peter Howe           Date of Birth: 1940-06-14           MRN: [id number] Visit Date: 11/05/2022 Requested by: Sonny Masters, FNP [address] [place],  [place] [address] PCP: Sonny Masters, FNP  Subjective: Chief Complaint  Patient presents with   Right Hand - Pain   Left Hand - Pain    HPI: Peter Howe is a 82 y.o. male who presents to the office reporting pain in both shoulders wrist and hands.  He had an injection with Dr. Alvester Morin in his cervical spine and it did help some of the shoulder pain.  Now he has some issues with dorsal numbness on both hands.  Having a little bit of recurrent pain in the neck and shoulders.  Taking Tylenol for symptoms..                ROS: All systems reviewed are negative as they relate to the chief complaint within the history of present illness.  Patient denies fevers or chills.  Assessment & Plan: Visit Diagnoses:  1. Cervicalgia     Plan: Impression is recurrent radicular type pain likely coming from the neck with improvement in symptoms in the upper extremities following epidural steroid injection in the cervical spine.  Plan is we will try Neurontin and a low-dose 100 mg twice a day for 4 weeks and then refer back to Dr. Alvester Morin for C-spine ESI.  Patient is on Xarelto.  Follow-Up Instructions: No follow-ups on file.   Orders:  No orders of the defined types were placed in this encounter.  No orders of the defined types were placed in this encounter.     Procedures: No procedures performed   Clinical Data: No additional findings.  Objective: Vital Signs: There were no vitals taken for this visit.  Physical Exam:  Constitutional: Patient appears well-developed HEENT:  Head: Normocephalic Eyes:EOM are normal Neck: Normal range of motion Cardiovascular: Normal rate Pulmonary/chest: Effort normal Neurologic: Patient is alert Skin: Skin is warm Psychiatric: Patient has normal mood and  affect  Ortho Exam: Ortho exam demonstrates shoulder flexion and abduction both above 90 degrees.  He has 5 out of 5 grip EPL FPL interosseous wrist flexion extension bicep triceps and deltoid strength.  Cervical spine range of motion predictably diminished mildly due to arthritis and this in flexion extension as well as rotation.  No definite paresthesias C5-T1.  Radial pulse intact bilaterally.  Specialty Comments:  No specialty comments available.  Imaging: No results found.   PMFS History: Patient Active Problem List   Diagnosis Date Noted   Chronic pain of both shoulders 09/03/2022   Bilateral wrist pain 09/03/2022   PVD (peripheral vascular disease) (HCC) 04/30/2022   Panic attacks 04/30/2022   Lumbar spondylosis 03/10/2022   Bilateral inguinal hernia 01/10/2021   Aortic atherosclerosis (HCC) 06/24/2020   Carpal tunnel syndrome, left upper limb 05/15/2020   Primary insomnia 05/19/2019   Controlled substance agreement signed 05/19/2019   Elevated liver enzymes 06/08/2017   Pacemaker 06/04/2017   Gastroesophageal reflux disease 02/05/2016   Essential hypertension with goal blood pressure less than 130/80 09/18/2013   ATRIAL FIBRILLATION 01/02/2010   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   Past Medical History:  Diagnosis Date   Anxiety    on meds   Aortic atherosclerosis (HCC)    Arthritis    generalized   Bilateral inguinal hernia  01/10/2021   Cancer (HCC)    skin cancers removed   Diverticulosis 01/10/2021   GERD (gastroesophageal reflux disease)    on meds   Hyperlipidemia    on meds   Hypertension    on meds   Overweight(278.02)    obesity   Permanent atrial fibrillation (HCC)    Seasonal allergies    Tachycardia-bradycardia (HCC)    s/p PPM    Family History  Problem Relation Age of Onset   Cancer Mother 32       unknown location   CAD Father 56   Breast cancer Sister    Breast cancer Sister    Stroke Sister    Breast cancer Sister     Breast cancer Sister    Diabetes Brother    Cancer Brother    Alcohol abuse Brother    Heart disease Daughter    Colon polyps Neg Hx    Colon cancer Neg Hx    Esophageal cancer Neg Hx    Stomach cancer Neg Hx    Rectal cancer Neg Hx     Past Surgical History:  Procedure Laterality Date   ARM SKIN LESION BIOPSY / EXCISION Left    Melanoma -removed by Dr. Lurline Hare TUNNEL RELEASE Left 2022   PACEMAKER INSERTION  03/28/2003   St. 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

## 2022-11-10 ENCOUNTER — Telehealth: Payer: Self-pay | Admitting: Physical Medicine and Rehabilitation

## 2022-11-10 NOTE — Telephone Encounter (Signed)
Pt called to see if his referral for injection was approved. Please call pt at [phone].

## 2022-11-11 LAB — TOXASSURE SELECT 13 (MW), URINE

## 2022-11-11 NOTE — Telephone Encounter (Signed)
Spoke with patient and scheduled injection for 11/23/22. Patient aware driver needed

## 2022-11-16 NOTE — Progress Notes (Signed)
Remote pacemaker transmission.   

## 2022-11-17 ENCOUNTER — Telehealth: Payer: Self-pay | Admitting: Orthopedic Surgery

## 2022-11-17 NOTE — Telephone Encounter (Signed)
Can try increasing dosage of gabapentin if that low dose is not helping.  I would try taking two of those capsules at once (200mg ) up to 3 times daily (~every 8 hours).  Not surprised that the low dosage is not helping much but we dont want to start out too high initially

## 2022-11-17 NOTE — Telephone Encounter (Signed)
Called and advised. Pt stated understanding  °

## 2022-11-17 NOTE — Telephone Encounter (Signed)
Patient called and said that the pain medication isn't working. What can he do?[phone]

## 2022-11-23 ENCOUNTER — Other Ambulatory Visit: Payer: Self-pay

## 2022-11-23 ENCOUNTER — Ambulatory Visit: Payer: Medicare HMO | Admitting: Physical Medicine and Rehabilitation

## 2022-11-23 DIAGNOSIS — M5412 Radiculopathy, cervical region: Secondary | ICD-10-CM

## 2022-11-23 MED ORDER — METHYLPREDNISOLONE ACETATE 80 MG/ML IJ SUSP
80.0000 mg | Freq: Once | INTRAMUSCULAR | Status: AC
  Administered 2022-11-23: 80 mg

## 2022-11-23 NOTE — Patient Instructions (Signed)
[facility] Physiatry Discharge Instructions  *At any time if you have questions or concerns they can be answered by calling [phone]  All Patients: You may experience an increase in your symptoms for the first 2 days (it can take 2 days to 2 weeks for the steroid/cortisone to have its maximal effect). You may use ice to the site for the first 24 hours; 20 minutes on and 20 minutes off and may use heat after that time. You may resume and continue your current pain medications. If you need a refill please contact the prescribing physician. You may resume your medications if any were stopped for the procedure. You may shower but no swimming, tub bath or Jacuzzi for 24 hours. Please remove bandage after 4 hours. You may resume light activities as tolerated. If you had Spine Injection, you should not drive for the next 3 hours due to anesthetics used in the procedure. Please have someone drive for you.  *If you have had sedation, Valium, Xanax, or lorazepam: Do not drive or use public transportation for 24 hours, do not operating hazardous machinery or make important personal/business decisions for 24 hours.  POSSIBLE STEROID SIDE EFFECTS: If experienced these should only last for a short period. Change in menstrual flow  Edema in (swelling)  Increased appetite Skin flushing (redness)  Skin rash/acne  Thrush (oral) Vaginitis    Increased sweating  Depression Increased blood glucose levels Cramping and leg/calf  Euphoria (feeling happy)  POSSIBLE PROCEDURE SIDE EFFECTS: Please call our office if concerned. Increased pain Increased numbness/tingling  Headache Nausea/vomiting Hematoma (bruising/bleeding) Edema (swelling at the site) Weakness  Infection (red/drainage at site) Fever greater than 100.54F  *In the event of a headache after epidural steroid injection: Drink plenty of fluids, especially water and try to lay flat when possible. If the headache does not get better after a few days  or as always if concerned please call the office.

## 2022-11-23 NOTE — Progress Notes (Signed)
Functional Pain Scale - descriptive words and definitions  Unmanageable (7)  Pain interferes with normal ADL's/nothing seems to help/sleep is very difficult/active distractions are very difficult to concentrate on. Severe range order  Average Pain 7 137/86  PR 63  +Driver, -BT, -Dye Allergies.

## 2022-11-23 NOTE — Progress Notes (Signed)
Peter Howe - 82 y.o. male MRN [id number]  Date of birth: Jul 19, 1940  Office Visit Note: Visit Date: 11/23/2022 PCP: Sonny Masters, FNP Referred by: Sonny Masters, FNP  Subjective: Chief Complaint  Patient presents with   Spine - Pain   HPI:  Peter Howe is a 82 y.o. male who comes in today at the request of Dr. Burnard Bunting for planned Right C7-T1 Cervical Interlaminar epidural steroid injection with fluoroscopic guidance.  The patient has failed conservative care including home exercise, medications, time and activity modification.  This injection will be diagnostic and hopefully therapeutic.  Please see requesting physician notes for further details and justification. Also complaining og bilateral hand and wrist pain 24/7.   ROS Otherwise per HPI.  Assessment & Plan: Visit Diagnoses:    ICD-10-CM   1. Cervical radiculopathy  M54.12 XR C-ARM NO REPORT    Epidural Steroid injection    methylPREDNISolone acetate (DEPO-MEDROL) injection 80 mg      Plan: No additional findings.   Meds & Orders:  Meds ordered this encounter  Medications   methylPREDNISolone acetate (DEPO-MEDROL) injection 80 mg    Orders Placed This Encounter  Procedures   XR C-ARM NO REPORT   Epidural Steroid injection    Follow-up: Return for visit to requesting provider as needed.   Procedures: No procedures performed  Cervical Epidural Steroid Injection - Interlaminar Approach with Fluoroscopic Guidance  Patient: Peter Howe      Date of Birth: 08-12-1940 MRN: [id number] PCP: Sonny Masters, FNP      Visit Date: 11/23/2022   Universal Protocol:    Date/Time: 09/16/248:32 AM  Consent Given By: the patient  Position: PRONE  Additional Comments: Vital signs were monitored before and after the procedure. Patient was prepped and draped in the usual sterile fashion. The correct patient, procedure, and site was verified.   Injection Procedure Details:   Procedure diagnoses:  Cervical radiculopathy [M54.12]    Meds Administered:  Meds ordered this encounter  Medications   methylPREDNISolone acetate (DEPO-MEDROL) injection 80 mg     Laterality: Right  Location/Site: C7-T1  Needle: 3.5 in., 20 ga. Tuohy  Needle Placement: Paramedian epidural space  Findings:  -Comments: Excellent flow of contrast into the epidural space.  Procedure Details: Using a paramedian approach from the side mentioned above, the region overlying the inferior lamina was localized under fluoroscopic visualization and the soft tissues overlying this structure were infiltrated with 4 ml. of 1% Lidocaine without Epinephrine. A # 20 gauge, Tuohy needle was inserted into the epidural space using a paramedian approach.  The epidural space was localized using loss of resistance along with contralateral oblique bi-planar fluoroscopic views.  After negative aspirate for air, blood, and CSF, a 2 ml. volume of Isovue-250 was injected into the epidural space and the flow of contrast was observed. Radiographs were obtained for documentation purposes.   The injectate was administered into the level noted above.  Additional Comments:  The patient tolerated the procedure well Dressing: 2 x 2 sterile gauze and Band-Aid    Post-procedure details: Patient was observed during the procedure. Post-procedure instructions were reviewed.  Patient left the clinic in stable condition.   Clinical History: CT CERVICAL SPINE WITHOUT CONTRAST   TECHNIQUE: Multidetector CT imaging of the cervical spine was performed without intravenous contrast. Multiplanar CT image reconstructions were also generated.   RADIATION DOSE REDUCTION: This exam was performed according to the departmental dose-optimization program which includes automated exposure  control, adjustment of the mA and/or kV according to patient size and/or use of iterative reconstruction technique.   COMPARISON:  None Available.    FINDINGS: Alignment: Unremarkable   Skull base and vertebrae: No acute fracture. No primary bone lesion or focal pathologic process. Subjective osteopenia   Soft tissues and spinal canal: No evidence of soft tissue mass or acute inflammation.   Disc levels:   C2-3: Facet and uncovertebral spurring on the right where there is moderate foraminal stenosis.   C3-4: Degenerative facet spurring on both sides. Spondylosis. Mild bilateral foraminal narrowing   C4-5: Spondylosis with mild facet spurring. Patent foramina. There may be a central protrusion at this level but no high-grade spinal stenosis suggested on sagittal reformats   C5-6: Spondylosis with mainly ventral spurring. Facet spurring is mild. Patent canal and foramina   C6-7: Greatest level of degenerative disc narrowing with endplate ridging and uncovertebral spurring. Mild right foraminal stenosis   C7-T1:Unremarkable.   Upper chest: No acute finding   IMPRESSION: 1. Ordinary and generalized cervical spine degeneration as described. 2. Moderate foraminal narrowing on the right at C2-3. Patent appearance of the canal and foramina at the highlighted C4-5 level.     Electronically Signed   By: Tiburcio Pea M.D.   On: 08/25/2022 05:34     Objective:  VS:  HT:    WT:   BMI:     BP:   HR: bpm  TEMP: ( )  RESP:  Physical Exam Vitals and nursing note reviewed.  Constitutional:      General: He is not in acute distress.    Appearance: Normal appearance. He is not ill-appearing.  HENT:     Head: Normocephalic and atraumatic.     Right Ear: External ear normal.     Left Ear: External ear normal.  Eyes:     Extraocular Movements: Extraocular movements intact.  Cardiovascular:     Rate and Rhythm: Normal rate.     Pulses: Normal pulses.  Abdominal:     General: There is no distension.     Palpations: Abdomen is soft.  Musculoskeletal:        General: No signs of injury.     Cervical back: Neck  supple. Tenderness present. No rigidity.     Right lower leg: No edema.     Left lower leg: No edema.     Comments: Patient has good strength in the upper extremities with 5 out of 5 strength in wrist extension long finger flexion APB.  No intrinsic hand muscle atrophy.  Negative Hoffmann's test.  Lymphadenopathy:     Cervical: No cervical adenopathy.  Skin:    Findings: No erythema or rash.  Neurological:     General: No focal deficit present.     Mental Status: He is alert and oriented to person, place, and time.     Sensory: No sensory deficit.     Motor: No weakness or abnormal muscle tone.     Coordination: Coordination normal.  Psychiatric:        Mood and Affect: Mood normal.        Behavior: Behavior normal.      Imaging: No results found.

## 2022-11-23 NOTE — Procedures (Signed)
Cervical Epidural Steroid Injection - Interlaminar Approach with Fluoroscopic Guidance  Patient: Peter Howe      Date of Birth: 1940/10/08 MRN: [id number] PCP: Sonny Masters, FNP      Visit Date: 11/23/2022   Universal Protocol:    Date/Time: 09/16/248:32 AM  Consent Given By: the patient  Position: PRONE  Additional Comments: Vital signs were monitored before and after the procedure. Patient was prepped and draped in the usual sterile fashion. The correct patient, procedure, and site was verified.   Injection Procedure Details:   Procedure diagnoses: Cervical radiculopathy [M54.12]    Meds Administered:  Meds ordered this encounter  Medications   methylPREDNISolone acetate (DEPO-MEDROL) injection 80 mg     Laterality: Right  Location/Site: C7-T1  Needle: 3.5 in., 20 ga. Tuohy  Needle Placement: Paramedian epidural space  Findings:  -Comments: Excellent flow of contrast into the epidural space.  Procedure Details: Using a paramedian approach from the side mentioned above, the region overlying the inferior lamina was localized under fluoroscopic visualization and the soft tissues overlying this structure were infiltrated with 4 ml. of 1% Lidocaine without Epinephrine. A # 20 gauge, Tuohy needle was inserted into the epidural space using a paramedian approach.  The epidural space was localized using loss of resistance along with contralateral oblique bi-planar fluoroscopic views.  After negative aspirate for air, blood, and CSF, a 2 ml. volume of Isovue-250 was injected into the epidural space and the flow of contrast was observed. Radiographs were obtained for documentation purposes.   The injectate was administered into the level noted above.  Additional Comments:  The patient tolerated the procedure well Dressing: 2 x 2 sterile gauze and Band-Aid    Post-procedure details: Patient was observed during the procedure. Post-procedure instructions were  reviewed.  Patient left the clinic in stable condition.

## 2022-11-25 ENCOUNTER — Other Ambulatory Visit: Payer: Self-pay | Admitting: Orthopedic Surgery

## 2022-11-26 ENCOUNTER — Other Ambulatory Visit: Payer: Self-pay | Admitting: Orthopedic Surgery

## 2022-12-10 DIAGNOSIS — H524 Presbyopia: Secondary | ICD-10-CM | POA: Diagnosis not present

## 2022-12-10 DIAGNOSIS — H52223 Regular astigmatism, bilateral: Secondary | ICD-10-CM | POA: Diagnosis not present

## 2022-12-10 DIAGNOSIS — H5203 Hypermetropia, bilateral: Secondary | ICD-10-CM | POA: Diagnosis not present

## 2022-12-10 DIAGNOSIS — D3131 Benign neoplasm of right choroid: Secondary | ICD-10-CM | POA: Diagnosis not present

## 2022-12-10 DIAGNOSIS — H2513 Age-related nuclear cataract, bilateral: Secondary | ICD-10-CM | POA: Diagnosis not present

## 2022-12-14 DIAGNOSIS — Z1283 Encounter for screening for malignant neoplasm of skin: Secondary | ICD-10-CM | POA: Diagnosis not present

## 2022-12-14 DIAGNOSIS — D225 Melanocytic nevi of trunk: Secondary | ICD-10-CM | POA: Diagnosis not present

## 2022-12-14 DIAGNOSIS — X32XXXD Exposure to sunlight, subsequent encounter: Secondary | ICD-10-CM | POA: Diagnosis not present

## 2022-12-14 DIAGNOSIS — L570 Actinic keratosis: Secondary | ICD-10-CM | POA: Diagnosis not present

## 2022-12-16 ENCOUNTER — Other Ambulatory Visit (INDEPENDENT_AMBULATORY_CARE_PROVIDER_SITE_OTHER): Payer: Medicare HMO

## 2022-12-16 ENCOUNTER — Other Ambulatory Visit (INDEPENDENT_AMBULATORY_CARE_PROVIDER_SITE_OTHER): Payer: Self-pay

## 2022-12-16 ENCOUNTER — Ambulatory Visit: Payer: Medicare HMO | Admitting: Orthopedic Surgery

## 2022-12-16 DIAGNOSIS — M25532 Pain in left wrist: Secondary | ICD-10-CM

## 2022-12-16 DIAGNOSIS — M79642 Pain in left hand: Secondary | ICD-10-CM | POA: Diagnosis not present

## 2022-12-16 DIAGNOSIS — M79641 Pain in right hand: Secondary | ICD-10-CM

## 2022-12-16 DIAGNOSIS — M25531 Pain in right wrist: Secondary | ICD-10-CM | POA: Diagnosis not present

## 2022-12-17 ENCOUNTER — Encounter: Payer: Self-pay | Admitting: Orthopedic Surgery

## 2022-12-17 NOTE — Progress Notes (Signed)
Office Visit Note   Patient: Peter Howe           Date of Birth: 11-26-1940           MRN: [id number] Visit Date: 12/16/2022 Requested by: Sonny Masters, FNP [address] [place],  [place] [address] PCP: Sonny Masters, FNP  Subjective: Chief Complaint  Patient presents with   Other    Bilateral hand/wrist pain    HPI: Peter Howe is a 82 y.o. male who presents to the office reporting bilateral hand pain.  Had prior cervical spine ESI on 11/23/2022.  States that he has pain radiating into the wrist and fingers in both wrists.  Pain wakes him from sleep at night.  Does have increased pain with hand movement.  Reports decreased grip strength.  Also bilateral wrist stiffness.  No history of trauma.  Does have a remote history of carpal tunnel release on the left.  Neurontin has not been helpful.  He likes to golf.  Symptoms ongoing since December.  Prior to that he was playing golf 5 days a week.  It hurts him to drive.  He states that his wrist situation is "livable" but also "not good".  The cervical spine ESI did help his neck and arm pain but the pain in his fingers and wrists has persisted..                ROS: All systems reviewed are negative as they relate to the chief complaint within the history of present illness.  Patient denies fevers or chills.  Assessment & Plan: Visit Diagnoses:  1. Bilateral hand pain   2. 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  Not sure if he is a great candidate for that but I will refer him to our hand surgeon for further evaluation and management.  Follow-up with [place] as needed..  Follow-Up Instructions: No follow-ups on file.   Orders:  Orders Placed This Encounter  Procedures   XR Hand Complete Right   XR Hand Complete Left    Ambulatory referral to Orthopedic Surgery   No orders of the defined types were placed in this encounter.     Procedures: No procedures performed   Clinical Data: No additional findings.  Objective: Vital Signs: There were no vitals taken for this visit.  Physical Exam:  Constitutional: Patient appears well-developed HEENT:  Head: Normocephalic Eyes:EOM are normal Neck: Normal range of motion Cardiovascular: Normal rate Pulmonary/chest: Effort normal Neurologic: Patient is alert Skin: Skin is warm Psychiatric: Patient has normal mood and affect  Ortho Exam: Ortho exam demonstrates pretty reasonable grip strength bilaterally.  He does have diminished dorsiflexion and palmar flexion of both wrist to about half normal.  Pronation supination intact.  Radial pulse intact.  EPL FPL interosseous strength is intact.  No definite paresthesias on the dorsal or palmar aspect of the hand.  Negative Tinel's cubital tunnel in each elbow.  Specialty Comments:  CT CERVICAL SPINE WITHOUT CONTRAST   TECHNIQUE: Multidetector CT imaging of the cervical spine was performed without intravenous contrast. Multiplanar CT image reconstructions were also generated.   RADIATION DOSE REDUCTION: This exam was performed according to the departmental dose-optimization program which includes automated exposure control, adjustment of the mA and/or kV according to patient size and/or use  of iterative reconstruction technique.   COMPARISON:  None Available.   FINDINGS: Alignment: Unremarkable   Skull base and vertebrae: No acute fracture. No primary bone lesion or focal pathologic process. Subjective osteopenia   Soft tissues and spinal canal: No evidence of soft tissue mass or acute inflammation.   Disc levels:   C2-3: Facet and uncovertebral spurring on the right where there is moderate foraminal stenosis.   C3-4: Degenerative facet spurring on both sides. Spondylosis. Mild bilateral  foraminal narrowing   C4-5: Spondylosis with mild facet spurring. Patent foramina. There may be a central protrusion at this level but no high-grade spinal stenosis suggested on sagittal reformats   C5-6: Spondylosis with mainly ventral spurring. Facet spurring is mild. Patent canal and foramina   C6-7: Greatest level of degenerative disc narrowing with endplate ridging and uncovertebral spurring. Mild right foraminal stenosis   C7-T1:Unremarkable.   Upper chest: No acute finding   IMPRESSION: 1. Ordinary and generalized cervical spine degeneration as described. 2. Moderate foraminal narrowing on the right at C2-3. Patent appearance of the canal and foramina at the highlighted C4-5 level.     Electronically Signed   By: Tiburcio Pea M.D.   On: 08/25/2022 05:34  Imaging: XR Hand Complete Left  Result Date: 12/17/2022 AP lateral oblique radiographs left hand reviewed.  No acute fracture.  There is widening of the scapholunate interval.  Advanced SLAC wrist is present.  CMC arthritis also noted.  XR Hand Complete Right  Result Date: 12/17/2022 AP lateral oblique radiographs right hand reviewed.  No acute fracture.  SLAC wrist is present with severe arthritis at the radio scaphoid interface as well as the radiolunate interface.  CMC arthritis also present at the thumb.    PMFS History: Patient Active Problem List   Diagnosis Date Noted   Chronic pain of both shoulders 09/03/2022   Bilateral wrist pain 09/03/2022   PVD (peripheral vascular disease) (HCC) 04/30/2022   Panic attacks 04/30/2022   Lumbar spondylosis 03/10/2022   Bilateral inguinal hernia 01/10/2021   Aortic atherosclerosis (HCC) 06/24/2020   Carpal tunnel syndrome, left upper limb 05/15/2020   Primary insomnia 05/19/2019   Controlled substance agreement signed 05/19/2019   Elevated liver enzymes 06/08/2017   Pacemaker 06/04/2017   Gastroesophageal reflux disease 02/05/2016   Essential hypertension  with goal blood pressure less than 130/80 09/18/2013   ATRIAL FIBRILLATION 01/02/2010   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   Past Medical History:  Diagnosis Date   Anxiety    on meds   Aortic atherosclerosis (HCC)    Arthritis    generalized   Bilateral inguinal hernia 01/10/2021   Cancer (HCC)    skin cancers removed   Diverticulosis 01/10/2021   GERD (gastroesophageal reflux disease)    on meds   Hyperlipidemia    on meds   Hypertension    on meds   Overweight(278.02)    obesity   Permanent atrial fibrillation (HCC)    Seasonal allergies    Tachycardia-bradycardia (HCC)    s/p PPM    Family History  Problem Relation Age of Onset   Cancer Mother 6       unknown location   CAD Father 41   Breast cancer Sister    Breast cancer Sister    Stroke Sister    Breast cancer Sister    Breast cancer Sister    Diabetes Brother    Cancer Brother    Alcohol abuse Brother    Heart disease Daughter  Colon polyps Neg Hx    Colon cancer Neg Hx    Esophageal cancer Neg Hx    Stomach cancer Neg Hx    Rectal cancer Neg Hx     Past Surgical History:  Procedure Laterality Date   ARM SKIN LESION BIOPSY / EXCISION Left    Melanoma -removed by Dr. Lurline Hare TUNNEL RELEASE Left 2022   PACEMAKER INSERTION  03/28/2003   St. 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

## 2022-12-21 ENCOUNTER — Ambulatory Visit: Payer: Medicare HMO | Admitting: Orthopedic Surgery

## 2022-12-21 DIAGNOSIS — M19132 Post-traumatic osteoarthritis, left wrist: Secondary | ICD-10-CM

## 2022-12-21 DIAGNOSIS — M19131 Post-traumatic osteoarthritis, right wrist: Secondary | ICD-10-CM | POA: Diagnosis not present

## 2022-12-21 DIAGNOSIS — M25532 Pain in left wrist: Secondary | ICD-10-CM | POA: Diagnosis not present

## 2022-12-21 DIAGNOSIS — M19139 Post-traumatic osteoarthritis, unspecified wrist: Secondary | ICD-10-CM

## 2022-12-21 DIAGNOSIS — M25531 Pain in right wrist: Secondary | ICD-10-CM | POA: Diagnosis not present

## 2022-12-21 MED ORDER — LIDOCAINE HCL 1 % IJ SOLN
1.0000 mL | INTRAMUSCULAR | Status: AC | PRN
Start: 2022-12-21 — End: 2022-12-21
  Administered 2022-12-21: 1 mL

## 2022-12-21 MED ORDER — BETAMETHASONE SOD PHOS & ACET 6 (3-3) MG/ML IJ SUSP
6.0000 mg | INTRAMUSCULAR | Status: AC | PRN
Start: 2022-12-21 — End: 2022-12-21
  Administered 2022-12-21: 6 mg via INTRA_ARTICULAR

## 2022-12-21 NOTE — Progress Notes (Signed)
Peter Howe - 82 y.o. male MRN [id number]  Date of birth: Jul 10, 1940  Office Visit Note: Visit Date: 12/21/2022 PCP: Sonny Masters, FNP Referred by: Cammy Copa, MD  Subjective: No chief complaint on file.  HPI: Peter Howe is a pleasant 82 y.o. male who presents today for evaluation of bilateral wrist pain that is been present now for multiple months, worsening in nature.  Has not tried a prior formalized treatments for the wrist.  Feels that the pain is equal on both sides.  Pertinent ROS were reviewed with the patient and found to be negative unless otherwise specified above in HPI.   Visit Reason:bilateral hands Duration of symptoms: 3+ months Hand dominance: right Occupation: Retired Diabetic: No Smoking: No Heart/Lung History:Pacemaker Blood Thinners: Xarelto  Prior Testing/EMG: 12/17/22 xrays Injections (Date):none Treatments: none Prior Surgery:none  Assessment & Plan: Visit Diagnoses: No diagnosis found.  Plan: Extensive discussion was had with patient today regarding his bilateral SLAC wrist arthritis.  He has significant arthritic changes at the radial scaphoid interface as well as the radiolunate interface.  On the left wrist, there is notable degeneration at the capitate surface as well.  I discussed a variety of options with him ranging from conservative to surgical.  From a conservative standpoint, we discussed utilization of braces and cortisone injection for pain control.  We also discussed anti-inflammatory medication to be utilized as needed.  From a surgical standpoint, we discussed salvage procedure such as proximal row carpectomy with allograft spacer versus scaphoidectomy and 4 corner fusion.  We also discussed the possibility for wrist arthroplasty or fusion in the future.  At this juncture, given that he has not undergone formalized conservative treatment, he would like to begin with bilateral cortisone injections for the wrist as  well as bilateral wrist braces which I am in agreement with.  I have advised that he return to me in the future as needed, particularly should the conservative treatment not work.  We can tentatively schedule him to follow-up in approximately 3 months time for repeat discussion.  Follow-up: No follow-ups on file.   Meds & Orders: No orders of the defined types were placed in this encounter.  No orders of the defined types were placed in this encounter.    Procedures: Medium Joint Inj: bilateral radiocarpal on 12/21/2022 9:35 AM Details: 25 G needle Medications (Right): 1 mL lidocaine 1 %; 6 mg betamethasone acetate-betamethasone sodium phosphate 6 (3-3) MG/ML Medications (Left): 1 mL lidocaine 1 %; 6 mg betamethasone acetate-betamethasone sodium phosphate 6 (3-3) MG/ML         Clinical History: CT CERVICAL SPINE WITHOUT CONTRAST   TECHNIQUE: Multidetector CT imaging of the cervical spine was performed without intravenous contrast. Multiplanar CT image reconstructions were also generated.   RADIATION DOSE REDUCTION: This exam was performed according to the departmental dose-optimization program which includes automated exposure control, adjustment of the mA and/or kV according to patient size and/or use of iterative reconstruction technique.   COMPARISON:  None Available.   FINDINGS: Alignment: Unremarkable   Skull base and vertebrae: No acute fracture. No primary bone lesion or focal pathologic process. Subjective osteopenia   Soft tissues and spinal canal: No evidence of soft tissue mass or acute inflammation.   Disc levels:   C2-3: Facet and uncovertebral spurring on the right where there is moderate foraminal stenosis.   C3-4: Degenerative facet spurring on both sides. Spondylosis. Mild bilateral foraminal narrowing   C4-5: Spondylosis with mild facet spurring.  Patent foramina. There may be a central protrusion at this level but no high-grade spinal stenosis  suggested on sagittal reformats   C5-6: Spondylosis with mainly ventral spurring. Facet spurring is mild. Patent canal and foramina   C6-7: Greatest level of degenerative disc narrowing with endplate ridging and uncovertebral spurring. Mild right foraminal stenosis   C7-T1:Unremarkable.   Upper chest: No acute finding   IMPRESSION: 1. Ordinary and generalized cervical spine degeneration as described. 2. Moderate foraminal narrowing on the right at C2-3. Patent appearance of the canal and foramina at the highlighted C4-5 level.     Electronically Signed   By: Tiburcio Pea M.D.   On: 08/25/2022 05:34  He reports that he quit smoking about 29 years ago. His smoking use included cigarettes. He started smoking about 67 years ago. He has a 37.9 pack-year smoking history. He has been exposed to tobacco smoke. He has never used smokeless tobacco.  Recent Labs    07/01/22 1118  LABURIC 9.4*    Objective:   Vital Signs: There were no vitals taken for this visit.  Physical Exam  Gen: Well-appearing, in no acute distress; non-toxic CV: Regular Rate. Well-perfused. Warm.  Resp: Breathing unlabored on room air; no wheezing. Psych: Fluid speech in conversation; appropriate affect; normal thought process  Ortho Exam PHYSICAL EXAM:  General: Patient is well appearing and in no distress. Cervical spine mobility is full in all directions:  Skin and Muscle: No significant skin changes are apparent to upper extremities.  Muscle bulk and contour normal.  Range of Motion and Palpation Tests: Mobility is full about the elbows with flexion and extension.  Forearm supination and pronation are 65/65 bilaterally.   Wrist flexion/extension is limited bilaterally.  Right wrist 45/15, left wrist 45/15.  Digital flexion and extension are full passively.  Thumb opposition is full to the base of the small fingers bilaterally.    Now cords or nodules are palpated.  No triggering is observed.     Moderate tenderness over the thumb CMC articulations is observed.  Scaphoid shift test is positive for pain and clunk bilaterally.  Neurologic, Vascular, Motor: Sensation is intact to light touch bilateral hands. Fingers pink and well perfused.  Capillary refill is brisk.      Lab Results  Component Value Date   HGBA1C 5.5 03/26/2016     Imaging: No results found.  Past Medical/Family/Surgical/Social History: Medications & Allergies reviewed per EMR, new medications updated. Patient Active Problem List   Diagnosis Date Noted   Chronic pain of both shoulders 09/03/2022   Bilateral wrist pain 09/03/2022   PVD (peripheral vascular disease) (HCC) 04/30/2022   Panic attacks 04/30/2022   Lumbar spondylosis 03/10/2022   Bilateral inguinal hernia 01/10/2021   Aortic atherosclerosis (HCC) 06/24/2020   Carpal tunnel syndrome, left upper limb 05/15/2020   Primary insomnia 05/19/2019   Controlled substance agreement signed 05/19/2019   Elevated liver enzymes 06/08/2017   Pacemaker 06/04/2017   Gastroesophageal reflux disease 02/05/2016   Essential hypertension with goal blood pressure less than 130/80 09/18/2013   ATRIAL FIBRILLATION 01/02/2010   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   Past Medical History:  Diagnosis Date   Anxiety    on meds   Aortic atherosclerosis (HCC)    Arthritis    generalized   Bilateral inguinal hernia 01/10/2021   Cancer (HCC)    skin cancers removed   Diverticulosis 01/10/2021   GERD (gastroesophageal reflux disease)    on meds   Hyperlipidemia  on meds   Hypertension    on meds   Overweight(278.02)    obesity   Permanent atrial fibrillation (HCC)    Seasonal allergies    Tachycardia-bradycardia (HCC)    s/p PPM   Family History  Problem Relation Age of Onset   Cancer Mother 5       unknown location   CAD Father 9   Breast cancer Sister    Breast cancer Sister    Stroke Sister    Breast cancer Sister    Breast  cancer Sister    Diabetes Brother    Cancer Brother    Alcohol abuse Brother    Heart disease Daughter    Colon polyps Neg Hx    Colon cancer Neg Hx    Esophageal cancer Neg Hx    Stomach cancer Neg Hx    Rectal cancer Neg Hx    Past Surgical History:  Procedure Laterality Date   ARM SKIN LESION BIOPSY / EXCISION Left    Melanoma -removed by Dr. Lurline Hare TUNNEL RELEASE Left 2022   PACEMAKER INSERTION  03/28/2003   St. 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  Tobacco Use   Smoking status: Former    Current packs/day: 0.00    Average packs/day: 1 pack/day for 37.9 years (37.9 ttl pk-yrs)    Types: Cigarettes    Start date: 03/30/1955    Quit date: 03/09/1993    Years since quitting: 29.8    Passive exposure: Past   Smokeless tobacco: Never  Vaping Use   Vaping status: Never Used  Substance and Sexual Activity   Alcohol use: Yes    Alcohol/week: 7.0 standard drinks of alcohol    Types: 1 Glasses of wine, 4 Cans of beer, 2 Shots of liquor per week   Drug use: No   Sexual activity: Not Currently    Partners: Female    Amatullah Christy Fara Boros) Denese Killings, M.D.  [facility] 9:30 AM

## 2023-01-02 ENCOUNTER — Other Ambulatory Visit: Payer: Self-pay | Admitting: Cardiology

## 2023-01-02 DIAGNOSIS — I4821 Permanent atrial fibrillation: Secondary | ICD-10-CM

## 2023-01-04 NOTE — Telephone Encounter (Signed)
Prescription refill request for Xarelto received.  Indication:afib Last office visit:12/23 Weight:79.2  kg Age:82 Scr:1.0  8/24 CrCl:63.8  ml/min  Prescription refilled

## 2023-01-15 ENCOUNTER — Encounter: Payer: Self-pay | Admitting: Family Medicine

## 2023-01-15 ENCOUNTER — Ambulatory Visit (INDEPENDENT_AMBULATORY_CARE_PROVIDER_SITE_OTHER): Payer: Medicare HMO | Admitting: Family Medicine

## 2023-01-15 VITALS — BP 145/77 | HR 94 | Temp 97.9°F | Ht 64.0 in | Wt 178.2 lb

## 2023-01-15 DIAGNOSIS — M25532 Pain in left wrist: Secondary | ICD-10-CM | POA: Diagnosis not present

## 2023-01-15 DIAGNOSIS — R350 Frequency of micturition: Secondary | ICD-10-CM

## 2023-01-15 DIAGNOSIS — M109 Gout, unspecified: Secondary | ICD-10-CM

## 2023-01-15 DIAGNOSIS — R748 Abnormal levels of other serum enzymes: Secondary | ICD-10-CM | POA: Diagnosis not present

## 2023-01-15 DIAGNOSIS — M79641 Pain in right hand: Secondary | ICD-10-CM | POA: Diagnosis not present

## 2023-01-15 DIAGNOSIS — I10 Essential (primary) hypertension: Secondary | ICD-10-CM | POA: Diagnosis not present

## 2023-01-15 DIAGNOSIS — R351 Nocturia: Secondary | ICD-10-CM | POA: Diagnosis not present

## 2023-01-15 DIAGNOSIS — M25531 Pain in right wrist: Secondary | ICD-10-CM

## 2023-01-15 DIAGNOSIS — M79642 Pain in left hand: Secondary | ICD-10-CM

## 2023-01-15 LAB — URINALYSIS, ROUTINE W REFLEX MICROSCOPIC
Bilirubin, UA: NEGATIVE
Glucose, UA: NEGATIVE
Ketones, UA: NEGATIVE
Leukocytes,UA: NEGATIVE
Nitrite, UA: NEGATIVE
Specific Gravity, UA: 1.01 (ref 1.005–1.030)
Urobilinogen, Ur: 0.2 mg/dL (ref 0.2–1.0)
pH, UA: 6 (ref 5.0–7.5)

## 2023-01-15 LAB — MICROSCOPIC EXAMINATION
Bacteria, UA: NONE SEEN
Epithelial Cells (non renal): NONE SEEN /[HPF] (ref 0–10)
Renal Epithel, UA: NONE SEEN /[HPF]
WBC, UA: NONE SEEN /[HPF] (ref 0–5)
Yeast, UA: NONE SEEN

## 2023-01-15 MED ORDER — PREDNISONE 10 MG (21) PO TBPK
ORAL_TABLET | ORAL | 0 refills | Status: DC
Start: 2023-01-15 — End: 2023-02-23

## 2023-01-15 MED ORDER — TAMSULOSIN HCL 0.4 MG PO CAPS: 0.4000 mg | ORAL_CAPSULE | Freq: Every day | ORAL | 3 refills | Status: DC

## 2023-01-15 NOTE — Progress Notes (Signed)
Subjective:  Patient ID: Peter Howe, male    DOB: 25-Jun-1940, 82 y.o.   MRN: [id number]  Patient Care Team: Sonny Masters, FNP as PCP - General (Family Medicine) Lanier Prude, MD as PCP - Electrophysiology (Cardiology) Tressia Danas, MD (Inactive) as Consulting Physician (Gastroenterology) Franky Macho, MD as Consulting Physician (General Surgery) Valeria Batman, MD (Inactive) as Consulting Physician (Orthopedic Surgery) Nita Sells, MD (Dermatology) Delora Fuel, OD (Optometry)   Chief Complaint:  Urinary Frequency (Ongoing but has gotten worse ) and Arthritis (Bilateral hands- ongoing )   HPI: Peter Howe is a 82 y.o. male presenting on 01/15/2023 for Urinary Frequency (Ongoing but has gotten worse ) and Arthritis (Bilateral hands- ongoing )   Discussed the use of AI scribe software for clinical note transcription with the patient, who gave verbal consent to proceed.  History of Present Illness   The patient, with a known history of an enlarged prostate, presents with worsening urinary symptoms over the past few weeks. They report an increased frequency of urination, particularly at night, with up to five episodes. They also describe a sense of urgency and dribbling. The patient denies any hematuria, dysuria, weight loss, night sweats, or abdominal pain. There is no reported scrotal or rectal pressure, and they deny any fevers.  In addition to the urinary symptoms, the patient also reports discomfort in their hands, which has been ongoing for several months. They have received treatment from an orthopedic doctor, including injections in both wrists, which provided some relief. However, the discomfort persists, particularly in the wrist joints. The patient denies any other new symptoms or changes in their health.  The patient's gout has been well-controlled, and they have not had any recent flare-ups. The patient's blood pressure has been well-controlled,  and they have been regularly attending cardiology appointments. They are due for their next cardiology appointment in December.          Relevant past medical, surgical, family, and social history reviewed and updated as indicated.  Allergies and medications reviewed and updated. Data reviewed: Chart in Epic.   Past Medical History:  Diagnosis Date   Anxiety    on meds   Aortic atherosclerosis (HCC)    Arthritis    generalized   Bilateral inguinal hernia 01/10/2021   Cancer (HCC)    skin cancers removed   Diverticulosis 01/10/2021   GERD (gastroesophageal reflux disease)    on meds   Hyperlipidemia    on meds   Hypertension    on meds   Overweight(278.02)    obesity   Permanent atrial fibrillation (HCC)    Seasonal allergies    Tachycardia-bradycardia (HCC)    s/p PPM    Past Surgical History:  Procedure Laterality Date   ARM SKIN LESION BIOPSY / EXCISION Left    Melanoma -removed by Dr. Lurline Hare TUNNEL RELEASE Left 2022   PACEMAKER INSERTION  03/28/2003   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  Tobacco Use  Smoking status: Former    Current packs/day: 0.00    Average packs/day: 1 pack/day for 37.9 years (37.9 ttl pk-yrs)    Types: Cigarettes    Start date: 03/30/1955    Quit date: 03/09/1993    Years since quitting: 29.8    Passive exposure: Past   Smokeless tobacco: Never  Vaping Use   Vaping status: Never Used  Substance and Sexual Activity   Alcohol use: Yes    Alcohol/week: 7.0 standard drinks of alcohol    Types:  1 Glasses of wine, 4 Cans of beer, 2 Shots of liquor per week   Drug use: No   Sexual activity: Not Currently    Partners: Female  Other Topics Concern   Not on file  Social History Narrative   Owns a Materials engineer, enjoys golf.   Lives in [place]   Brother lives 5 miles away   Social Determinants of Health   Financial Resource Strain: Low Risk  (06/24/2022)   Overall Financial Resource Strain (CARDIA)    Difficulty of Paying Living Expenses: Not hard at all  Food Insecurity: No Food Insecurity (06/24/2022)   Hunger Vital Sign    Worried About Running Out of Food in the Last Year: Never true    Ran Out of Food in the Last Year: Never true  Transportation Needs: No Transportation Needs (06/24/2022)   PRAPARE - Administrator, Civil Service (Medical): No    Lack of Transportation (Non-Medical): No  Physical Activity: Sufficiently Active (06/24/2022)   Exercise Vital Sign    Days of Exercise per Week: 5 days    Minutes of Exercise per Session: 60 min  Stress: No Stress Concern Present (06/24/2022)   [employer] of Occupational Health - Occupational Stress Questionnaire    Feeling of Stress : Not at all  Social Connections: Moderately Integrated (06/24/2022)   Social Connection and Isolation Panel [NHANES]    Frequency of Communication with Friends and Family: More than three times a week    Frequency of Social Gatherings with Friends and Family: More than three times a week    Attends Religious Services: More than 4 times per year    Active Member of [employer] or Organizations: Yes    Attends Banker Meetings: More than 4 times per year    Marital Status: Widowed  Intimate Partner Violence: Not At Risk (06/24/2022)   Humiliation, Afraid, Rape, and Kick questionnaire    Fear of Current or Ex-Partner: No    Emotionally Abused: No    Physically Abused: No    Sexually Abused: No    Outpatient Encounter Medications as of 01/15/2023  Medication Sig    acetaminophen (TYLENOL) 325 MG tablet Take 650 mg by mouth every 6 (six) hours as needed for mild pain.   atenolol (TENORMIN) 50 MG tablet Take 1 tablet (50 mg total) by mouth daily.   colchicine 0.6 MG tablet Take 1.2 mg (2 tablets) by mouth at the first sign of a gout flare followed by 0.6 mg (1 tablet) one hour later.   levocetirizine (XYZAL) 5 MG tablet Take 1 tablet (5 mg total) by mouth every evening.   losartan (COZAAR) 50 MG tablet Take 1 tablet (50 mg total) by mouth daily.   Multiple Vitamin (MULTIVITAMIN WITH MINERALS) TABS tablet Take 1 tablet by mouth daily.   nitroGLYCERIN (NITROSTAT) 0.4 MG SL tablet Place 1 tablet under the tongue every 5 (five) minutes as needed for chest pain.   predniSONE (  STERAPRED UNI-PAK 21 TAB) 10 MG (21) TBPK tablet As directed x 6 days   rivaroxaban (XARELTO) 20 MG TABS tablet Take 1 tablet (20 mg total) by mouth daily with supper.   rosuvastatin (CRESTOR) 5 MG tablet Take 1 tablet (5 mg total) by mouth once a week.   sertraline (ZOLOFT) 25 MG tablet Take 1 tablet (25 mg total) by mouth daily.   tamsulosin (FLOMAX) 0.4 MG CAPS capsule Take 1 capsule (0.4 mg total) by mouth daily.   zolpidem (AMBIEN) 5 MG tablet Take 1 tablet (5 mg total) by mouth at bedtime.   gabapentin (NEURONTIN) 100 MG capsule Take 1 capsule (100 mg total) by mouth 2 (two) times daily. (Patient not taking: Reported on 01/15/2023)   No facility-administered encounter medications on file as of 01/15/2023.    Allergies  Allergen Reactions   Allopurinol Itching   Morphine And Codeine Other (See Comments)    Patient states it wired him up and didn't help. Prefers not to take again.   Penicillins Rash    Has patient had a PCN reaction causing immediate rash, facial/tongue/throat swelling, SOB or lightheadedness with hypotension: No Has patient had a PCN reaction causing severe rash involving mucus membranes or skin necrosis: No Has patient had a PCN reaction that required  hospitalization No Has patient had a PCN reaction occurring within the last 10 years: No If all of the above answers are "NO", then may proceed with Cephalosporin use.    Statins    Sulfonamide Derivatives Rash    Pertinent ROS per HPI, otherwise unremarkable      Objective:  BP (!) 145/77   Pulse 94   Temp 97.9 F (36.6 C) (Temporal)   Ht 5\' 4"  (1.626 m)   Wt 178 lb 3.2 oz (80.8 kg)   SpO2 94%   BMI 30.59 kg/m    Wt Readings from Last 3 Encounters:  01/15/23 178 lb 3.2 oz (80.8 kg)  11/04/22 174 lb 9.6 oz (79.2 kg)  09/03/22 176 lb 3.2 oz (79.9 kg)    Physical Exam Vitals and nursing note reviewed.  Constitutional:      General: He is not in acute distress.    Appearance: Normal appearance. He is not ill-appearing, toxic-appearing or diaphoretic.  HENT:     Head: Normocephalic and atraumatic.     Nose: Nose normal.     Mouth/Throat:     Mouth: Mucous membranes are moist.  Eyes:     Conjunctiva/sclera: Conjunctivae normal.     Pupils: Pupils are equal, round, and reactive to light.  Cardiovascular:     Rate and Rhythm: Normal rate. Rhythm irregularly irregular.     Heart sounds: No murmur heard.    No friction rub. No gallop.  Pulmonary:     Effort: Pulmonary effort is normal.     Breath sounds: Normal breath sounds.  Abdominal:     General: Bowel sounds are normal.     Palpations: Abdomen is soft.     Tenderness: There is no abdominal tenderness.  Musculoskeletal:     Right forearm: Normal.     Left forearm: Normal.     Right wrist: Swelling and tenderness present. Decreased range of motion.     Left wrist: Swelling and tenderness present. Decreased range of motion.     Right hand: Swelling and tenderness present. Decreased range of motion.     Left hand: Swelling and tenderness present. Decreased range of motion.     Cervical back: Neck supple.  Right lower leg: No edema.     Left lower leg: No edema.  Skin:    General: Skin is warm and dry.      Capillary Refill: Capillary refill takes less than 2 seconds.  Neurological:     General: No focal deficit present.     Mental Status: He is alert and oriented to person, place, and time.  Psychiatric:        Mood and Affect: Mood normal.        Behavior: Behavior normal.        Thought Content: Thought content normal.        Judgment: Judgment normal.          Results for orders placed or performed in visit on 11/04/22  CBC with Differential/Platelet  Result Value Ref Range   WBC 8.9 3.4 - 10.8 x10E3/uL   RBC 4.19 4.14 - 5.80 x10E6/uL   Hemoglobin 13.0 13.0 - 17.7 g/dL   Hematocrit 04.5 40.9 - 51.0 %   MCV 95 79 - 97 fL   MCH 31.0 26.6 - 33.0 pg   MCHC 32.8 31.5 - 35.7 g/dL   RDW 81.1 91.4 - 78.2 %   Platelets 267 150 - 450 x10E3/uL   Neutrophils 72 Not Estab. %   Lymphs 16 Not Estab. %   Monocytes 9 Not Estab. %   Eos 2 Not Estab. %   Basos 1 Not Estab. %   Neutrophils Absolute 6.4 1.4 - 7.0 x10E3/uL   Lymphocytes Absolute 1.5 0.7 - 3.1 x10E3/uL   Monocytes Absolute 0.8 0.1 - 0.9 x10E3/uL   EOS (ABSOLUTE) 0.2 0.0 - 0.4 x10E3/uL   Basophils Absolute 0.1 0.0 - 0.2 x10E3/uL   Immature Granulocytes 0 Not Estab. %   Immature Grans (Abs) 0.0 0.0 - 0.1 x10E3/uL  CMP14+EGFR  Result Value Ref Range   Glucose 87 70 - 99 mg/dL   BUN 15 8 - 27 mg/dL   Creatinine, Ser 9.56 0.76 - 1.27 mg/dL   eGFR 75 >21 HY/QMV/7.84   BUN/Creatinine Ratio 15 10 - 24   Sodium 139 134 - 144 mmol/L   Potassium 4.5 3.5 - 5.2 mmol/L   Chloride 101 96 - 106 mmol/L   CO2 23 20 - 29 mmol/L   Calcium 9.2 8.6 - 10.2 mg/dL   Total Protein 6.2 6.0 - 8.5 g/dL   Albumin 3.8 3.7 - 4.7 g/dL   Globulin, Total 2.4 1.5 - 4.5 g/dL   Bilirubin Total 0.5 0.0 - 1.2 mg/dL   Alkaline Phosphatase 85 44 - 121 IU/L   AST 27 0 - 40 IU/L   ALT 24 0 - 44 IU/L  Lipid panel  Result Value Ref Range   Cholesterol, Total 167 100 - 199 mg/dL   Triglycerides 67 0 - 149 mg/dL   HDL 94 >69 mg/dL   VLDL Cholesterol Cal 13  5 - 40 mg/dL   LDL Chol Calc (NIH) 60 0 - 99 mg/dL   Chol/HDL Ratio 1.8 0.0 - 5.0 ratio  Thyroid Panel With TSH  Result Value Ref Range   TSH 1.640 0.450 - 4.500 uIU/mL   T4, Total 4.6 4.5 - 12.0 ug/dL   T3 Uptake Ratio 29 24 - 39 %   Free Thyroxine Index 1.3 1.2 - 4.9  ToxASSURE Select 13 (MW), Urine  Result Value Ref Range   Summary Note        Pertinent labs & imaging results that were available during my care of the patient  were reviewed by me and considered in my medical decision making.  Assessment & Plan:  Susie was seen today for urinary frequency and arthritis.  Diagnoses and all orders for this visit:  Controlled gout -     Uric acid -     CMP14+EGFR -     ANA Comprehensive Panel  Urinary frequency -     Urinalysis, Routine w reflex microscopic -     Urine Culture -     PSA, total and free -     CMP14+EGFR -     CBC with Differential/Platelet -     tamsulosin (FLOMAX) 0.4 MG CAPS capsule; Take 1 capsule (0.4 mg total) by mouth daily.  Bilateral wrist pain -     ANA Comprehensive Panel -     predniSONE (STERAPRED UNI-PAK 21 TAB) 10 MG (21) TBPK tablet; As directed x 6 days  Bilateral hand pain -     ANA Comprehensive Panel -     predniSONE (STERAPRED UNI-PAK 21 TAB) 10 MG (21) TBPK tablet; As directed x 6 days  Nocturia -     PSA, total and free  Essential hypertension with goal blood pressure less than 130/80 -     Thyroid Panel With TSH -     CMP14+EGFR -     CBC with Differential/Platelet  Elevated liver enzymes -     CMP14+EGFR     Assessment and Plan    Benign Prostatic Hyperplasia (BPH) Progressive urinary symptoms over the past few months, including nocturia (5 times per night), dribbling, and occasional weak stream. No hematuria, dysuria, weight loss, night sweats, or lower abdominal pain. Likely due to prostate enlargement. Differential includes prostate enlargement vs. other urinary tract issues. Discussed starting Flomax, with caution  advised for orthostatic hypotension. If labs are normal, referral to urology for potential intervention discussed. 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 Imaging shows osteophytes, indicating osteoarthritis. Differential includes osteoarthritis vs. autoimmune arthritis. Discussed prednisone taper pack for symptom relief. Referral to hand specialist as per Dr. Diamantina Providence previous referral. - Order ANA test to rule out autoimmune arthritis - Prescribe prednisone taper pack - Refer to hand specialist as per Dr. Diamantina Providence previous referral  Gout Currently well-controlled. No recent flares reported. Discussed checking uric acid levels to monitor condition. - Check uric acid levels  General Health Maintenance Blood pressure slightly elevated today, possibly due to poor sleep. Regular cardiology follow-ups scheduled. Flu shot deferred due to upcoming prednisone treatment. - Monitor blood pressure - Administer flu shot at next visit  Follow-up - Follow up with lab results - Schedule urology referral if indicated by lab results.          Continue all other maintenance medications.  Follow up plan: Return if symptoms worsen or fail to improve.   Continue healthy lifestyle choices, including diet (rich in fruits, vegetables, and lean proteins, and low in salt and simple carbohydrates) and exercise (at least 30 minutes of moderate physical activity daily).  The above assessment and management plan was discussed with the patient. The patient verbalized understanding of and has agreed to the management plan. Patient is aware to call the clinic if they develop any new symptoms or if symptoms persist or  worsen. Patient  is aware when to return to the clinic for a follow-up visit. Patient educated on when it is appropriate to go to the emergency department.   Kari Baars, FNP-C [facility] [phone]

## 2023-01-16 LAB — CBC WITH DIFFERENTIAL/PLATELET
Basophils Absolute: 0 10*3/uL (ref 0.0–0.2)
Basos: 1 %
EOS (ABSOLUTE): 0.2 10*3/uL (ref 0.0–0.4)
Eos: 3 %
Hematocrit: 44.8 % (ref 37.5–51.0)
Hemoglobin: 14.5 g/dL (ref 13.0–17.7)
Immature Grans (Abs): 0 10*3/uL (ref 0.0–0.1)
Immature Granulocytes: 0 %
Lymphocytes Absolute: 1.1 10*3/uL (ref 0.7–3.1)
Lymphs: 16 %
MCH: 32.3 pg (ref 26.6–33.0)
MCHC: 32.4 g/dL (ref 31.5–35.7)
MCV: 100 fL — ABNORMAL HIGH (ref 79–97)
Monocytes Absolute: 0.7 10*3/uL (ref 0.1–0.9)
Monocytes: 10 %
Neutrophils Absolute: 4.7 10*3/uL (ref 1.4–7.0)
Neutrophils: 70 %
Platelets: 250 10*3/uL (ref 150–450)
RBC: 4.49 x10E6/uL (ref 4.14–5.80)
RDW: 14 % (ref 11.6–15.4)
WBC: 6.7 10*3/uL (ref 3.4–10.8)

## 2023-01-16 LAB — CMP14+EGFR
ALT: 20 [IU]/L (ref 0–44)
AST: 27 [IU]/L (ref 0–40)
Albumin: 4 g/dL (ref 3.7–4.7)
Alkaline Phosphatase: 92 [IU]/L (ref 44–121)
BUN/Creatinine Ratio: 12 (ref 10–24)
BUN: 13 mg/dL (ref 8–27)
Bilirubin Total: 0.4 mg/dL (ref 0.0–1.2)
CO2: 25 mmol/L (ref 20–29)
Calcium: 9.7 mg/dL (ref 8.6–10.2)
Chloride: 99 mmol/L (ref 96–106)
Creatinine, Ser: 1.07 mg/dL (ref 0.76–1.27)
Globulin, Total: 2.1 g/dL (ref 1.5–4.5)
Glucose: 96 mg/dL (ref 70–99)
Potassium: 4.9 mmol/L (ref 3.5–5.2)
Sodium: 140 mmol/L (ref 134–144)
Total Protein: 6.1 g/dL (ref 6.0–8.5)
eGFR: 69 mL/min/{1.73_m2} (ref >59)

## 2023-01-16 LAB — THYROID PANEL WITH TSH
Free Thyroxine Index: 1.9 (ref 1.2–4.9)
T3 Uptake Ratio: 31 % (ref 24–39)
T4, Total: 6.1 ug/dL (ref 4.5–12.0)
TSH: 1.2 u[IU]/mL (ref 0.450–4.500)

## 2023-01-16 LAB — ANA COMPREHENSIVE PANEL
Anti JO-1: <0.2 AI (ref 0.0–0.9)
Centromere Ab Screen: <0.2 AI (ref 0.0–0.9)
Chromatin Ab SerPl-aCnc: <0.2 AI (ref 0.0–0.9)
ENA RNP Ab: 0.2 AI (ref 0.0–0.9)
ENA SM Ab Ser-aCnc: <0.2 AI (ref 0.0–0.9)
ENA SSA (RO) Ab: <0.2 AI (ref 0.0–0.9)
ENA SSB (LA) Ab: <0.2 AI (ref 0.0–0.9)
Scleroderma (Scl-70) (ENA) Antibody, IgG: <0.2 AI (ref 0.0–0.9)
dsDNA Ab: <1 [IU]/mL (ref 0–9)

## 2023-01-16 LAB — PSA, TOTAL AND FREE
PSA, Free Pct: 19 %
PSA, Free: 0.38 ng/mL
Prostate Specific Ag, Serum: 2 ng/mL (ref 0.0–4.0)

## 2023-01-16 LAB — URIC ACID: Uric Acid: 6.2 mg/dL (ref 3.8–8.4)

## 2023-01-17 LAB — URINE CULTURE

## 2023-01-21 ENCOUNTER — Other Ambulatory Visit: Payer: Self-pay | Admitting: *Deleted

## 2023-01-21 DIAGNOSIS — R300 Dysuria: Secondary | ICD-10-CM

## 2023-01-27 ENCOUNTER — Ambulatory Visit: Payer: Medicare HMO | Admitting: Orthopedic Surgery

## 2023-02-02 ENCOUNTER — Ambulatory Visit (INDEPENDENT_AMBULATORY_CARE_PROVIDER_SITE_OTHER): Payer: Medicare HMO

## 2023-02-02 DIAGNOSIS — I495 Sick sinus syndrome: Secondary | ICD-10-CM | POA: Diagnosis not present

## 2023-02-02 DIAGNOSIS — I4821 Permanent atrial fibrillation: Secondary | ICD-10-CM

## 2023-02-03 LAB — CUP PACEART REMOTE DEVICE CHECK
Battery Remaining Longevity: 95 mo
Battery Remaining Percentage: 72 %
Battery Voltage: 3.01 V
Brady Statistic RV Percent Paced: 56 %
Date Time Interrogation Session: 20241126041828
Implantable Lead Connection Status: 753985
Implantable Lead Connection Status: 753985
Implantable Lead Implant Date: 19980120
Implantable Lead Implant Date: 20050119
Implantable Lead Location: 753859
Implantable Lead Location: 753860
Implantable Pulse Generator Implant Date: 20200917
Lead Channel Impedance Value: 680 Ohm
Lead Channel Pacing Threshold Amplitude: 0.75 V
Lead Channel Pacing Threshold Pulse Width: 0.4 ms
Lead Channel Sensing Intrinsic Amplitude: 12 mV
Lead Channel Setting Pacing Amplitude: 2.5 V
Lead Channel Setting Pacing Pulse Width: 0.4 ms
Lead Channel Setting Sensing Sensitivity: 2 mV
Pulse Gen Model: 2272
Pulse Gen Serial Number: 9154140

## 2023-02-08 ENCOUNTER — Other Ambulatory Visit: Payer: Medicare HMO

## 2023-02-08 DIAGNOSIS — R300 Dysuria: Secondary | ICD-10-CM | POA: Diagnosis not present

## 2023-02-08 LAB — URINALYSIS, COMPLETE
Bilirubin, UA: NEGATIVE
Glucose, UA: NEGATIVE
Ketones, UA: NEGATIVE
Leukocytes,UA: NEGATIVE
Nitrite, UA: NEGATIVE
Specific Gravity, UA: <1.005 — ABNORMAL LOW (ref 1.005–1.030)
Urobilinogen, Ur: 0.2 mg/dL (ref 0.2–1.0)
pH, UA: 5 (ref 5.0–7.5)

## 2023-02-08 LAB — MICROSCOPIC EXAMINATION
Bacteria, UA: NONE SEEN
Epithelial Cells (non renal): NONE SEEN /[HPF] (ref 0–10)
Renal Epithel, UA: NONE SEEN /[HPF]
WBC, UA: NONE SEEN /[HPF] (ref 0–5)
Yeast, UA: NONE SEEN

## 2023-02-09 ENCOUNTER — Other Ambulatory Visit: Payer: Self-pay | Admitting: Family Medicine

## 2023-02-09 DIAGNOSIS — R319 Hematuria, unspecified: Secondary | ICD-10-CM

## 2023-02-16 DIAGNOSIS — N401 Enlarged prostate with lower urinary tract symptoms: Secondary | ICD-10-CM | POA: Insufficient documentation

## 2023-02-16 NOTE — Progress Notes (Unsigned)
Name: Peter Howe DOB: 10-08-1940 MRN: [id number]  History of Present Illness: Peter Howe is a 82 y.o. male who presents today at [facility] for new patient appointment.  - GU history: 1. BPH with LUTS (frequency, nocturia, dribbling, weak stream). - Taking Flomax 0.4 mg daily.  Per chart review:  > 03/04/2022:  - CT stone study showed no GU stones, masses, or hydronephrosis. Prostate gland 5.4 by 4.2 by 4.7 cm (volume = 56 cm^3), compatible with mild prostatomegaly.   > 01/15/2023: - Normal renal function (creatinine 1.07, GFR 69). - Normal PSA (2.0). - UA: 1+ blood. - Urine microscopy: Normal (0 RBC/hpf). - Negative urine culture.  > 02/08/2023:  - UA: 1+ blood. - Urine microscopy: Normal (0 RBC/hpf).  Today: He reports weak urinary stream and urgency. Denies urinary hesitancy, dysuria, gross hematuria, straining to void, or sensations of incomplete emptying. He denies flank pain or abdominal pain.  He denies prior history of gross hematuria.  He denies history of kidney stones.  He denies history of pyelonephritis.  He denies history of recent or recurrent UTI. He denies history of GU malignancy or pelvic radiation.  He denies history of autoimmune disease. He reports history of smoking (quit 20-30 years ago; previously smoked 1 ppd x37 years). He reports taking anticoagulants (Xarelto).   Fall Screening: Do you usually have a device to assist in your mobility? No   Medications: Current Outpatient Medications  Medication Sig Dispense Refill   acetaminophen (TYLENOL) 325 MG tablet Take 650 mg by mouth every 6 (six) hours as needed for mild pain.     atenolol (TENORMIN) 50 MG tablet Take 1 tablet (50 mg total) by mouth daily. 90 tablet 3   gabapentin (NEURONTIN) 100 MG capsule Take 1 capsule (100 mg total) by mouth 2 (two) times daily. 60 capsule 0   levocetirizine (XYZAL) 5 MG tablet Take 1 tablet (5 mg total) by mouth every evening. 90  tablet 1   losartan (COZAAR) 50 MG tablet Take 1 tablet (50 mg total) by mouth daily. 90 tablet 1   Multiple Vitamin (MULTIVITAMIN WITH MINERALS) TABS tablet Take 1 tablet by mouth daily.     nitroGLYCERIN (NITROSTAT) 0.4 MG SL tablet Place 1 tablet under the tongue every 5 (five) minutes as needed for chest pain. 25 tablet 0   predniSONE (STERAPRED UNI-PAK 21 TAB) 10 MG (21) TBPK tablet As directed x 6 days 21 tablet 0   rivaroxaban (XARELTO) 20 MG TABS tablet Take 1 tablet (20 mg total) by mouth daily with supper. 90 tablet 1   rosuvastatin (CRESTOR) 5 MG tablet Take 1 tablet (5 mg total) by mouth once a week. 15 tablet 3   sertraline (ZOLOFT) 25 MG tablet Take 1 tablet (25 mg total) by mouth daily. 90 tablet 1   tamsulosin (FLOMAX) 0.4 MG CAPS capsule Take 1 capsule (0.4 mg total) by mouth daily. 30 capsule 3   zolpidem (AMBIEN) 5 MG tablet Take 1 tablet (5 mg total) by mouth at bedtime. 30 tablet 3   colchicine 0.6 MG tablet Take 1.2 mg (2 tablets) by mouth at the first sign of a gout flare followed by 0.6 mg (1 tablet) one hour later. (Patient not taking: Reported on 02/18/2023) 10 tablet 0   No current facility-administered medications for this visit.    Allergies: Allergies  Allergen Reactions   Allopurinol Itching   Morphine And Codeine Other (See Comments)    Patient states it wired him  up and didn't help. Prefers not to take again.   Penicillins Rash    Has patient had a PCN reaction causing immediate rash, facial/tongue/throat swelling, SOB or lightheadedness with hypotension: No Has patient had a PCN reaction causing severe rash involving mucus membranes or skin necrosis: No Has patient had a PCN reaction that required hospitalization No Has patient had a PCN reaction occurring within the last 10 years: No If all of the above answers are "NO", then may proceed with Cephalosporin use.    Statins    Sulfonamide Derivatives Rash    Past Medical History:  Diagnosis Date    Anxiety    on meds   Aortic atherosclerosis (HCC)    Arthritis    generalized   Bilateral inguinal hernia 01/10/2021   Cancer (HCC)    skin cancers removed   Diverticulosis 01/10/2021   GERD (gastroesophageal reflux disease)    on meds   Hyperlipidemia    on meds   Hypertension    on meds   Overweight(278.02)    obesity   Permanent atrial fibrillation (HCC)    Seasonal allergies    Tachycardia-bradycardia (HCC)    s/p PPM   Past Surgical History:  Procedure Laterality Date   ARM SKIN LESION BIOPSY / EXCISION Left    Melanoma -removed by Dr. Lurline Hare TUNNEL RELEASE Left 2022   PACEMAKER INSERTION  03/28/2003   St. Jude-PPM   PPM GENERATOR CHANGEOUT N/A 11/24/2018   Procedure: PPM GENERATOR CHANGEOUT;  Surgeon: Hillis Range, MD;  Location: [facility];  Service: Cardiovascular;  Laterality: N/A;   Family History  Problem Relation Age of Onset   Cancer Mother 16       unknown location   CAD Father 68   Breast cancer Sister    Breast cancer Sister    Stroke Sister    Breast cancer Sister    Breast cancer Sister    Diabetes Brother    Cancer Brother    Alcohol abuse Brother    Heart disease Daughter    Colon polyps Neg Hx    Colon cancer Neg Hx    Esophageal cancer Neg Hx    Stomach cancer Neg Hx    Rectal cancer Neg Hx    Social History   Socioeconomic History   Marital status: Widowed    Spouse name: Not on file   Number of children: 1   Years of education: 12   Highest education level: High school graduate  Occupational History   Occupation: runs a Materials engineer    Comment: part time  Tobacco Use   Smoking status: Former    Current packs/day: 0.00    Average packs/day: 1 pack/day for 37.9 years (37.9 ttl pk-yrs)    Types: Cigarettes    Start date: 03/30/1955    Quit date: 03/09/1993    Years since quitting: 29.9    Passive exposure: Past   Smokeless tobacco: Never  Vaping Use   Vaping status: Never Used  Substance and Sexual  Activity   Alcohol use: Yes    Alcohol/week: 7.0 standard drinks of alcohol    Types: 1 Glasses of wine, 4 Cans of beer, 2 Shots of liquor per week   Drug use: No   Sexual activity: Not Currently    Partners: Female  Other Topics Concern   Not on file  Social History Narrative   Owns a Materials engineer, enjoys golf.   Lives in [place]  lives 5 miles away   Social Drivers of Health   Financial Resource Strain: Low Risk  (06/24/2022)   Overall Financial Resource Strain (CARDIA)    Difficulty of Paying Living Expenses: Not hard at all  Food Insecurity: No Food Insecurity (06/24/2022)   Hunger Vital Sign    Worried About Running Out of Food in the Last Year: Never true    Ran Out of Food in the Last Year: Never true  Transportation Needs: No Transportation Needs (06/24/2022)   PRAPARE - Administrator, Civil Service (Medical): No    Lack of Transportation (Non-Medical): No  Physical Activity: Sufficiently Active (06/24/2022)   Exercise Vital Sign    Days of Exercise per Week: 5 days    Minutes of Exercise per Session: 60 min  Stress: No Stress Concern Present (06/24/2022)   [employer] of Occupational Health - Occupational Stress Questionnaire    Feeling of Stress : Not at all  Social Connections: Moderately Integrated (06/24/2022)   Social Connection and Isolation Panel [NHANES]    Frequency of Communication with Friends and Family: More than three times a week    Frequency of Social Gatherings with Friends and Family: More than three times a week    Attends Religious Services: More than 4 times per year    Active Member of [employer] or Organizations: Yes    Attends Banker Meetings: More than 4 times per year    Marital Status: Widowed  Intimate Partner Violence: Not At Risk (06/24/2022)   Humiliation, Afraid, Rape, and Kick questionnaire    Fear of Current or Ex-Partner: No    Emotionally Abused: No    Physically Abused: No    Sexually Abused:  No    Review of Systems Constitutional: Patient denies any unintentional weight loss or change in strength lntegumentary: Patient denies any rashes or pruritus Cardiovascular: Patient denies chest pain or syncope Respiratory: Patient denies shortness of breath Gastrointestinal: Patient denies nausea, vomiting, constipation, or diarrhea Musculoskeletal: Patient denies muscle cramps or weakness Neurologic: Patient denies convulsions or seizures Allergic/Immunologic: Patient denies recent allergic reaction(s) Hematologic/Lymphatic: Patient denies bleeding tendencies Endocrine: Patient denies heat/cold intolerance  GU: As per HPI.  OBJECTIVE Vitals:   02/18/23 0843  BP: 126/83  Pulse: 60  Temp: 98 F (36.7 C)   There is no height or weight on file to calculate BMI.  Physical Examination Constitutional: No obvious distress; patient is non-toxic appearing  Cardiovascular: No visible lower extremity edema.  Respiratory: The patient does not have audible wheezing/stridor; respirations do not appear labored  Gastrointestinal: Abdomen non-distended Musculoskeletal: Normal ROM of UEs  Skin: No obvious rashes/open sores  Neurologic: CN 2-12 grossly intact Psychiatric: Answered questions appropriately with normal affect  Hematologic/Lymphatic/Immunologic: No obvious bruises or sites of spontaneous bleeding  UA: 3-10 RBC/hpf with no evidence of UTI PVR: 0 ml  ASSESSMENT Benign prostatic hyperplasia with urinary frequency - Plan: Urinalysis, Routine w reflex microscopic, BLADDER SCAN AMB NON-IMAGING  Nocturia  Microscopic hematuria - Plan: CT HEMATURIA WORKUP  Former smoker - Plan: CT HEMATURIA WORKUP  BPH with LUTS (frequency and nocturia): Well managed with Flomax 0.4 mg daily  Microscopic hematuria:  For asymptomatic microscopic hematuria we discussed possible etiologies including but not limited to: vigorous exercise, sexual activity, stone, trauma, blood thinner use,  urinary tract infection, urethral irritation secondary to chronic kidney disease, glomerulonephropathy, BPH, malignancy. We discussed pt's smoking as a risk factor for GU cancer and encouraged continued smoking cessation.  Likely  due to his prostatomegaly along with anticoagulant use (Xarelto), however based on the AUA 2020 [facility] guideline and risk stratification for this patient / individual risk factors (age + former smoker) he was advised that the recommended workup includes CT hematuria protocol and cystoscopy. Cystoscopic evaluation will also be informative regarding prostatomegaly / any bladder outlet obstruction.    Pt verbalized understanding and agreement. All questions were answered.  PLAN Advised the following: 1. Continue Flomax 0.4 mg daily. 2. CT hematuria protocol. 3. Return for 1st available cystoscopy with any urology MD.  Orders Placed This Encounter  Procedures   CT HEMATURIA WORKUP    Standing Status:   Future    Expiration Date:   02/18/2024    Reason for Exam (SYMPTOM  OR DIAGNOSIS REQUIRED):   Microscopic hematuria    Preferred imaging location?:   [facility]   Urinalysis, Routine w reflex microscopic   BLADDER SCAN AMB NON-IMAGING    It has been explained that the patient is to follow regularly with their PCP in addition to all other providers involved in their care and to follow instructions provided by these respective offices. Patient advised to contact urology clinic if any urologic-pertaining questions, concerns, new symptoms or problems arise in the interim period.  There are no Patient Instructions on file for this visit.  Electronically signed by:  Donnita Falls, FNP   02/18/23    9:09 AM

## 2023-02-18 ENCOUNTER — Encounter: Payer: Self-pay | Admitting: Urology

## 2023-02-18 ENCOUNTER — Ambulatory Visit: Payer: Medicare HMO | Admitting: Urology

## 2023-02-18 VITALS — BP 126/83 | HR 60 | Temp 98.0°F

## 2023-02-18 DIAGNOSIS — R350 Frequency of micturition: Secondary | ICD-10-CM

## 2023-02-18 DIAGNOSIS — N401 Enlarged prostate with lower urinary tract symptoms: Secondary | ICD-10-CM | POA: Diagnosis not present

## 2023-02-18 DIAGNOSIS — R3129 Other microscopic hematuria: Secondary | ICD-10-CM | POA: Diagnosis not present

## 2023-02-18 DIAGNOSIS — Z87891 Personal history of nicotine dependence: Secondary | ICD-10-CM | POA: Insufficient documentation

## 2023-02-18 DIAGNOSIS — R351 Nocturia: Secondary | ICD-10-CM

## 2023-02-18 LAB — URINALYSIS, ROUTINE W REFLEX MICROSCOPIC
Bilirubin, UA: NEGATIVE
Glucose, UA: NEGATIVE
Ketones, UA: NEGATIVE
Leukocytes,UA: NEGATIVE
Nitrite, UA: NEGATIVE
Specific Gravity, UA: 1.02 (ref 1.005–1.030)
Urobilinogen, Ur: 0.2 mg/dL (ref 0.2–1.0)
pH, UA: 6 (ref 5.0–7.5)

## 2023-02-18 LAB — MICROSCOPIC EXAMINATION: Bacteria, UA: NONE SEEN

## 2023-02-18 LAB — BLADDER SCAN AMB NON-IMAGING: Scan Result: 0

## 2023-02-21 NOTE — Progress Notes (Unsigned)
Cardiology Office Note Date:  02/21/2023  Patient ID:  Peter Howe, Peter Howe January 29, 1941, MRN [id number] PCP:  Sonny Masters, FNP  Electrophysiologist: Dr. Johney Frame >>> Dr. Lalla Brothers    Chief Complaint:  *** annual visit  History of Present Illness: Peter Howe is a 82 y.o. male with history of HTN, permanent AFib, tachy-brady w/PPM.  I saw him 03/05/20 He is dong very well.  Walks daily 30-45 minutes and golfs regularly.  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 No CP, palpitations or cardiac awareness. Mentions some indigestion in the morning sometimes, though once moving around belches a few times and passes. No SOB or DOE Infrequent and fleeting orthostatic lightheadedness NO near syncope or syncope No bleeding or signs of bleeding Sees his PMD 2x a year  Seeing Drs. Allred >> Lalla Brothers since then  Dr. Lalla Brothers last on 02/16/22, reported myalgias on low dose statin, self stopped.  Mentioned mild intermittent hematochezia , hemoptysis Rates controlled, pacer functioning normally No changes made  *** lipids?  Myalgias?  Who manages *** permanent AF, rates *** Xarelto, dose, labs, bleeding      Device information SJM dual chamber PPM last gen change 2020 RV lead 2005 RA lead 1998 Has an ambandoned RV lead  Past Medical History:  Diagnosis Date   Anxiety    on meds   Aortic atherosclerosis (HCC)    Arthritis    generalized   Bilateral inguinal hernia 01/10/2021   Cancer (HCC)    skin cancers removed   Diverticulosis 01/10/2021   GERD (gastroesophageal reflux disease)    on meds   Hyperlipidemia    on meds   Hypertension    on meds   Overweight(278.02)    obesity   Permanent atrial fibrillation (HCC)    Seasonal allergies    Tachycardia-bradycardia (HCC)    s/p PPM    Past Surgical History:  Procedure Laterality Date   ARM SKIN LESION BIOPSY / EXCISION Left    Melanoma -removed by Dr. Lurline Hare TUNNEL RELEASE Left 2022   PACEMAKER  INSERTION  03/28/2003   St. Jude-PPM   PPM GENERATOR CHANGEOUT N/A 11/24/2018   Procedure: PPM GENERATOR CHANGEOUT;  Surgeon: Hillis Range, MD;  Location: [facility];  Service: Cardiovascular;  Laterality: N/A;    Current Outpatient Medications  Medication Sig Dispense Refill   acetaminophen (TYLENOL) 325 MG tablet Take 650 mg by mouth every 6 (six) hours as needed for mild pain.     atenolol (TENORMIN) 50 MG tablet Take 1 tablet (50 mg total) by mouth daily. 90 tablet 3   colchicine 0.6 MG tablet Take 1.2 mg (2 tablets) by mouth at the first sign of a gout flare followed by 0.6 mg (1 tablet) one hour later. (Patient not taking: Reported on 02/18/2023) 10 tablet 0   gabapentin (NEURONTIN) 100 MG capsule Take 1 capsule (100 mg total) by mouth 2 (two) times daily. 60 capsule 0   levocetirizine (XYZAL) 5 MG tablet Take 1 tablet (5 mg total) by mouth every evening. 90 tablet 1   losartan (COZAAR) 50 MG tablet Take 1 tablet (50 mg total) by mouth daily. 90 tablet 1   Multiple Vitamin (MULTIVITAMIN WITH MINERALS) TABS tablet Take 1 tablet by mouth daily.     nitroGLYCERIN (NITROSTAT) 0.4 MG SL tablet Place 1 tablet under the tongue every 5 (five) minutes as needed for chest pain. 25 tablet 0   predniSONE (STERAPRED UNI-PAK 21 TAB)  10 MG (21) TBPK tablet As directed x 6 days 21 tablet 0   rivaroxaban (XARELTO) 20 MG TABS tablet Take 1 tablet (20 mg total) by mouth daily with supper. 90 tablet 1   rosuvastatin (CRESTOR) 5 MG tablet Take 1 tablet (5 mg total) by mouth once a week. 15 tablet 3   sertraline (ZOLOFT) 25 MG tablet Take 1 tablet (25 mg total) by mouth daily. 90 tablet 1   tamsulosin (FLOMAX) 0.4 MG CAPS capsule Take 1 capsule (0.4 mg total) by mouth daily. 30 capsule 3   zolpidem (AMBIEN) 5 MG tablet Take 1 tablet (5 mg total) by mouth at bedtime. 30 tablet 3   No current facility-administered medications for this visit.    Allergies:   Allopurinol, Morphine and codeine,  Penicillins, Statins, and Sulfonamide derivatives   Social History:  The patient  reports that he quit smoking about 29 years ago. His smoking use included cigarettes. He started smoking about 67 years ago. He has a 37.9 pack-year smoking history. He has been exposed to tobacco smoke. He has never used smokeless tobacco. He reports current alcohol use of about 7.0 standard drinks of alcohol per week. He reports that he does not use drugs.   Family History:  The patient's family history includes Alcohol abuse in his brother; Breast cancer in his sister, sister, sister, and sister; CAD (age of onset: 57) in his father; Cancer in his brother; Cancer (age of onset: 68) in his mother; Diabetes in his brother; Heart disease in his daughter; Stroke in his sister.  ROS:  Please see the history of present illness.    All other systems are reviewed and otherwise negative.   PHYSICAL EXAM:  VS:  There were no vitals taken for this visit. BMI: There is no height or weight on file to calculate BMI. Well nourished, well developed, in no acute distress HEENT: normocephalic, atraumatic Neck: no JVD, carotid bruits or masses Cardiac:  *** irreg-irreg; no significant murmurs, no rubs, or gallops Lungs: *** CTA b/l, no wheezing, rhonchi or rales Abd: soft, nontender MS: no deformity oratrophy Ext: *** no edema Skin: warm and dry, no rash Neuro:  No gross deficits appreciated Psych: euthymic mood, full affect  *** PPM site (R side) is stable, no tethering or discomfort   EKG:  Done today and reviewed by myself shows  ***  Device interrogation done today and reviewed by myself:  *** Battery and lead measurements are good ***    03/26/2016; TTE Study Conclusions  - Left ventricle: The cavity size was normal. Wall thickness was    increased in a pattern of moderate LVH. Systolic function was    normal. The estimated ejection fraction was in the range of 60%    to 65%. Wall motion was normal; there  were no regional wall    motion abnormalities.  - Aortic valve: Mildly calcified annulus. Trileaflet; mildly    thickened leaflets. Valve area (VTI): 2.58 cm^2. Valve area    (Vmax): 2.48 cm^2.  - Left atrium: The atrium was mildly dilated.  - Atrial septum: No defect or patent foramen ovale was identified.  - Pulmonary arteries: Systolic pressure was mildly increased. PA    peak pressure: 31 mm Hg (S).  - Technically difficult study.   Recent Labs: 01/15/2023: ALT 20; BUN 13; Creatinine, Ser 1.07; Hemoglobin 14.5; Platelets 250; Potassium 4.9; Sodium 140; TSH 1.200  11/04/2022: Chol/HDL Ratio 1.8; Cholesterol, Total 167; HDL 94; LDL Chol Calc (NIH) 60;  Triglycerides 67   CrCl cannot be calculated (Patient's most recent lab result is older than the maximum 21 days allowed.).   Wt Readings from Last 3 Encounters:  01/15/23 178 lb 3.2 oz (80.8 kg)  11/04/22 174 lb 9.6 oz (79.2 kg)  09/03/22 176 lb 3.2 oz (79.9 kg)     Other studies reviewed: Additional studies/records reviewed today include: summarized above  ASSESSMENT AND PLAN:  1. PPM     *** intact function     *** No programming changes made  2. Permanent AFib     CHA2DS2Vasc is 3, on  Xarelto, *** appropriately dosed      3. HTN     *** Looks good   Disposition: ***  Current medicines are reviewed at length with the patient today.  The patient did not have any concerns regarding medicines.  Norma Fredrickson, PA-C 02/21/2023 12:21 PM     [facility] [address] [place] [address] [phone] (office)  [phone] (fax)

## 2023-02-23 ENCOUNTER — Encounter: Payer: Medicare HMO | Admitting: Cardiology

## 2023-02-23 ENCOUNTER — Ambulatory Visit: Payer: Medicare HMO | Attending: Physician Assistant | Admitting: Physician Assistant

## 2023-02-23 ENCOUNTER — Encounter: Payer: Self-pay | Admitting: Physician Assistant

## 2023-02-23 VITALS — BP 142/68 | HR 66 | Ht 64.0 in | Wt 184.8 lb

## 2023-02-23 DIAGNOSIS — R0602 Shortness of breath: Secondary | ICD-10-CM | POA: Diagnosis not present

## 2023-02-23 DIAGNOSIS — D6869 Other thrombophilia: Secondary | ICD-10-CM

## 2023-02-23 DIAGNOSIS — I4821 Permanent atrial fibrillation: Secondary | ICD-10-CM | POA: Diagnosis not present

## 2023-02-23 DIAGNOSIS — I10 Essential (primary) hypertension: Secondary | ICD-10-CM | POA: Diagnosis not present

## 2023-02-23 DIAGNOSIS — Z950 Presence of cardiac pacemaker: Secondary | ICD-10-CM | POA: Diagnosis not present

## 2023-02-23 LAB — CUP PACEART INCLINIC DEVICE CHECK
Battery Remaining Longevity: 93 mo
Battery Voltage: 3.01 V
Brady Statistic RA Percent Paced: 0 %
Brady Statistic RV Percent Paced: 56 %
Date Time Interrogation Session: 20241217173554
Implantable Lead Connection Status: 753985
Implantable Lead Connection Status: 753985
Implantable Lead Implant Date: 19980120
Implantable Lead Implant Date: 20050119
Implantable Lead Location: 753859
Implantable Lead Location: 753860
Implantable Pulse Generator Implant Date: 20200917
Lead Channel Impedance Value: 637.5 Ohm
Lead Channel Pacing Threshold Amplitude: 0.5 V
Lead Channel Pacing Threshold Amplitude: 0.5 V
Lead Channel Pacing Threshold Pulse Width: 0.4 ms
Lead Channel Pacing Threshold Pulse Width: 0.4 ms
Lead Channel Sensing Intrinsic Amplitude: 12 mV
Lead Channel Setting Pacing Amplitude: 2.5 V
Lead Channel Setting Pacing Pulse Width: 0.4 ms
Lead Channel Setting Sensing Sensitivity: 2 mV
Pulse Gen Model: 2272
Pulse Gen Serial Number: 9154140

## 2023-02-23 NOTE — Patient Instructions (Signed)
Medication Instructions:   Your physician recommends that you continue on your current medications as directed. Please refer to the Current Medication list given to you today.   *If you need a refill on your cardiac medications before your next appointment, please call your pharmacy*   Lab Work:  NONE ORDERED  TODAY   If you have labs (blood work) drawn today and your tests are completely normal, you will receive your results only by: MyChart Message (if you have MyChart) OR A paper copy in the mail If you have any lab test that is abnormal or we need to change your treatment, we will call you to review the results.   Testing/Procedures: Your physician has requested that you have an echocardiogram. Echocardiography is a painless test that uses sound waves to create images of your heart. It provides your doctor with information about the size and shape of your heart and how well your heart's chambers and valves are working. This procedure takes approximately one hour. There are no restrictions for this procedure. Please do NOT wear cologne, perfume, aftershave, or lotions (deodorant is allowed). Please arrive 15 minutes prior to your appointment time.  Please note: We ask at that you not bring children with you during ultrasound (echo/ vascular) testing. Due to room size and safety concerns, children are not allowed in the ultrasound rooms during exams. Our front office staff cannot provide observation of children in our lobby area while testing is being conducted. An adult accompanying a patient to their appointment will only be allowed in the ultrasound room at the discretion of the ultrasound technician under special circumstances. We apologize for any inconvenience.    Follow-Up: At [facility], you and your health needs are our priority.  As part of our continuing mission to provide you with exceptional heart care, we have created designated Provider Care Teams.  These Care  Teams include your primary Cardiologist (physician) and Advanced Practice Providers (APPs -  Physician Assistants and Nurse Practitioners) who all work together to provide you with the care you need, when you need it.  We recommend signing up for the patient portal called "MyChart".  Sign up information is provided on this After Visit Summary.  MyChart is used to connect with patients for Virtual Visits (Telemedicine).  Patients are able to view lab/test results, encounter notes, upcoming appointments, etc.  Non-urgent messages can be sent to your provider as well.   To learn more about what you can do with MyChart, go to [email or web address].    Your next appointment:   1 year(s)  Provider:   Steffanie Dunn, MD or Francis Dowse, PA-C    Other Instructions

## 2023-03-04 NOTE — Addendum Note (Signed)
Addended by: Geralyn Flash D on: 03/04/2023 03:43 PM   Modules accepted: Orders

## 2023-03-04 NOTE — Progress Notes (Signed)
Remote pacemaker transmission.   

## 2023-03-05 ENCOUNTER — Encounter: Payer: Self-pay | Admitting: Family Medicine

## 2023-03-05 ENCOUNTER — Ambulatory Visit (INDEPENDENT_AMBULATORY_CARE_PROVIDER_SITE_OTHER): Payer: Medicare HMO | Admitting: Family Medicine

## 2023-03-05 ENCOUNTER — Other Ambulatory Visit: Payer: Self-pay | Admitting: Cardiology

## 2023-03-05 VITALS — BP 134/76 | HR 61 | Temp 97.0°F | Ht 64.0 in | Wt 184.2 lb

## 2023-03-05 DIAGNOSIS — R609 Edema, unspecified: Secondary | ICD-10-CM

## 2023-03-05 DIAGNOSIS — I700 Atherosclerosis of aorta: Secondary | ICD-10-CM | POA: Diagnosis not present

## 2023-03-05 DIAGNOSIS — I4821 Permanent atrial fibrillation: Secondary | ICD-10-CM | POA: Diagnosis not present

## 2023-03-05 DIAGNOSIS — F5101 Primary insomnia: Secondary | ICD-10-CM | POA: Diagnosis not present

## 2023-03-05 DIAGNOSIS — I10 Essential (primary) hypertension: Secondary | ICD-10-CM | POA: Diagnosis not present

## 2023-03-05 DIAGNOSIS — F410 Panic disorder [episodic paroxysmal anxiety] without agoraphobia: Secondary | ICD-10-CM

## 2023-03-05 MED ORDER — SERTRALINE HCL 25 MG PO TABS: 25.0000 mg | ORAL_TABLET | Freq: Every day | ORAL | 1 refills | Status: DC

## 2023-03-05 MED ORDER — ZOLPIDEM TARTRATE 5 MG PO TABS: 5.0000 mg | ORAL_TABLET | Freq: Every day | ORAL | 3 refills | Status: DC

## 2023-03-05 MED ORDER — FUROSEMIDE 20 MG PO TABS: 20.0000 mg | ORAL_TABLET | Freq: Every day | ORAL | 3 refills | Status: DC

## 2023-03-05 NOTE — Progress Notes (Signed)
Subjective:  Patient ID: Peter Howe, male    DOB: 20-May-1940, 82 y.o.   MRN: [id number]  Patient Care Team: Sonny Masters, FNP as PCP - General (Family Medicine) Lanier Prude, MD as PCP - Electrophysiology (Cardiology) Tressia Danas, MD (Inactive) as Consulting Physician (Gastroenterology) Franky Macho, MD as Consulting Physician (General Surgery) Valeria Batman, MD (Inactive) as Consulting Physician (Orthopedic Surgery) Nita Sells, MD (Dermatology) Delora Fuel, OD (Optometry)   Chief Complaint:  Insomnia (3 month follow up - patient states that it has improved )   HPI: Peter Howe is a 82 y.o. male presenting on 03/05/2023 for Insomnia (3 month follow up - patient states that it has improved )   Discussed the use of AI scribe software for clinical note transcription with the patient, who gave verbal consent to proceed.  History of Present Illness   The patient presents today for management of chronic medical conditions. He has a history of arrhythmia, specifically atrial fibrillation and sinus tachycardia, and reports a recent weight gain of approximately six pounds within a week, without any changes in routine. This weight gain was associated with fluid retention, as evidenced by afternoon ankle swelling. The patient denied any changes in shortness of breath, fatigue, chest pain, or control of their arrhythmia. There were no reports of headaches, confusion, or weakness. He was evaluated by cardiology and an echo was ordered and is pending. He was not started on diuretics.   In addition to the cardiac concerns, the patient was also scheduled for a CT scan linked to their prostate. A urologist appointment was also scheduled for follow up after the CT scan.  The patient was on a regimen of beta blockers, Xarelto, and Crestor. They also took Ambien for sleep, with no reported daytime fatigue, and Sertraline (Zoloft) for mood regulation, which they felt was  effective. The patient reported no panic attacks.  The patient also reported some bruising with minor trauma, but denied any significant abnormal bleeding or blood in their stool. They were not started on any diuretic medication at the time of the weight gain and fluid retention.           03/05/2023    8:46 AM 11/04/2022    8:07 AM 06/24/2022    9:33 AM 04/30/2022    9:54 AM 04/15/2022    3:34 PM  Depression screen PHQ 2/9  Decreased Interest 0 0 0 0 0  Down, Depressed, Hopeless 0 0 0 0 0  PHQ - 2 Score 0 0 0 0 0  Altered sleeping 0 0 0 0   Tired, decreased energy 3 0 0 3   Change in appetite 0 0 0 0   Feeling bad or failure about yourself  0 0 0 0   Trouble concentrating 0 0 0 0   Moving slowly or fidgety/restless 0 0 0 0   Suicidal thoughts 0 0 0 0   PHQ-9 Score 3 0 0 3   Difficult doing work/chores Not difficult at all Not difficult at all Not difficult at all Not difficult at all       03/05/2023    8:47 AM 11/04/2022    8:07 AM 04/30/2022    9:55 AM 10/30/2021    9:13 AM  GAD 7 : Generalized Anxiety Score  Nervous, Anxious, on Edge 0 0 0 0  Control/stop worrying 0 0 0 0  Worry too much - different things  0 0 0  Trouble relaxing  0 0 0  Restless  0 0 0  Easily annoyed or irritable  0 0 0  Afraid - awful might happen  0 0 0  Total GAD 7 Score  0 0 0  Anxiety Difficulty  Not difficult at all Not difficult at all Not difficult at all       Relevant past medical, surgical, family, and social history reviewed and updated as indicated.  Allergies and medications reviewed and updated. Data reviewed: Chart in Epic.   Past Medical History:  Diagnosis Date   Anxiety    on meds   Aortic atherosclerosis (HCC)    Arthritis    generalized   Bilateral inguinal hernia 01/10/2021   Cancer (HCC)    skin cancers removed   Diverticulosis 01/10/2021   GERD (gastroesophageal reflux disease)    on meds   Hyperlipidemia    on meds   Hypertension    on meds    Overweight(278.02)    obesity   Permanent atrial fibrillation (HCC)    Seasonal allergies    Tachycardia-bradycardia (HCC)    s/p PPM    Past Surgical History:  Procedure Laterality Date   ARM SKIN LESION BIOPSY / EXCISION Left    Melanoma -removed by Dr. Lurline Hare TUNNEL RELEASE Left 2022   PACEMAKER INSERTION  03/28/2003   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  Tobacco Use   Smoking status: Former    Current packs/day: 0.00    Average packs/day: 1 pack/day for 37.9 years (37.9 ttl pk-yrs)    Types: Cigarettes    Start date: 03/30/1955    Quit date: 03/09/1993    Years since quitting: 30.0    Passive exposure: Past   Smokeless tobacco: Never  Vaping Use   Vaping status: Never Used  Substance and Sexual Activity   Alcohol use: Yes    Alcohol/week: 7.0 standard drinks of alcohol    Types: 1 Glasses of wine, 4 Cans of beer, 2 Shots of liquor per week   Drug use: No   Sexual activity: Not Currently    Partners: Female  Other Topics Concern   Not on file  Social History Narrative   Owns a Materials engineer, enjoys golf.   Lives in [place]   Brother lives 5 miles away   Social Drivers of Health   Financial Resource Strain: Low Risk  (06/24/2022)   Overall Financial Resource Strain (CARDIA)    Difficulty of Paying Living Expenses: Not hard at all  Food Insecurity: No Food Insecurity (06/24/2022)   Hunger Vital Sign    Worried About Running Out of Food in the Last Year: Never true    Ran Out of Food in the Last Year: Never true   Transportation Needs: No Transportation Needs (06/24/2022)   PRAPARE - Administrator, Civil Service (Medical): No    Lack of Transportation (Non-Medical): No  Physical Activity: Sufficiently Active (06/24/2022)   Exercise Vital Sign    Days of Exercise per Week:  5 days    Minutes of Exercise per Session: 60 min  Stress: No Stress Concern Present (06/24/2022)   [employer] of Occupational Health - Occupational Stress Questionnaire    Feeling of Stress : Not at all  Social Connections: Moderately Integrated (06/24/2022)   Social Connection and Isolation Panel [NHANES]    Frequency of Communication with Friends and Family: More than three times a week    Frequency of Social Gatherings with Friends and Family: More than three times a week    Attends Religious Services: More than 4 times per year    Active Member of [employer] or Organizations: Yes    Attends Banker Meetings: More than 4 times per year    Marital Status: Widowed  Intimate Partner Violence: Not At Risk (06/24/2022)   Humiliation, Afraid, Rape, and Kick questionnaire    Fear of Current or Ex-Partner: No    Emotionally Abused: No    Physically Abused: No    Sexually Abused: No    Outpatient Encounter Medications as of 03/05/2023  Medication Sig   acetaminophen (TYLENOL) 325 MG tablet Take 650 mg by mouth every 6 (six) hours as needed for mild pain.   atenolol (TENORMIN) 50 MG tablet Take 1 tablet (50 mg total) by mouth daily.   colchicine 0.6 MG tablet Take 1.2 mg (2 tablets) by mouth at the first sign of a gout flare followed by 0.6 mg (1 tablet) one hour later.   furosemide (LASIX) 20 MG tablet Take 1 tablet (20 mg total) by mouth daily.   losartan (COZAAR) 50 MG tablet Take 1 tablet (50 mg total) by mouth daily.   Multiple Vitamin (MULTIVITAMIN WITH MINERALS) TABS tablet Take 1 tablet by mouth daily.   nitroGLYCERIN (NITROSTAT) 0.4 MG SL tablet Place 1 tablet under the tongue every 5 (five)  minutes as needed for chest pain.   rivaroxaban (XARELTO) 20 MG TABS tablet Take 1 tablet (20 mg total) by mouth daily with supper.   rosuvastatin (CRESTOR) 5 MG tablet Take 1 tablet (5 mg total) by mouth once a week.   tamsulosin (FLOMAX) 0.4 MG CAPS capsule Take 1 capsule (0.4 mg total) by mouth daily.   [DISCONTINUED] sertraline (ZOLOFT) 25 MG tablet Take 1 tablet (25 mg total) by mouth daily.   [DISCONTINUED] zolpidem (AMBIEN) 5 MG tablet Take 1 tablet (5 mg total) by mouth at bedtime.   sertraline (ZOLOFT) 25 MG tablet Take 1 tablet (25 mg total) by mouth daily.   zolpidem (AMBIEN) 5 MG tablet Take 1 tablet (5 mg total) by mouth at bedtime.   No facility-administered encounter medications on file as of 03/05/2023.    Allergies  Allergen Reactions   Allopurinol Itching   Morphine And Codeine Other (See Comments)    Patient states it wired him up and didn't help. Prefers not to take again.   Penicillins Rash    Has patient had a PCN reaction causing immediate rash, facial/tongue/throat swelling, SOB or lightheadedness with hypotension: No Has patient had a PCN reaction causing severe rash involving mucus membranes or skin necrosis: No Has patient had a PCN reaction that required hospitalization No Has patient had a PCN reaction occurring within the last 10 years: No If all of the above answers are "NO", then may proceed with Cephalosporin use.    Statins    Sulfonamide Derivatives Rash    Pertinent ROS per HPI, otherwise unremarkable      Objective:  BP 134/76   Pulse 61  Temp (!) 97 F (36.1 C)   Ht 5\' 4"  (1.626 m)   Wt 184 lb 3.2 oz (83.6 kg)   SpO2 95%   BMI 31.62 kg/m    Wt Readings from Last 3 Encounters:  03/05/23 184 lb 3.2 oz (83.6 kg)  02/23/23 184 lb 12.8 oz (83.8 kg)  01/15/23 178 lb 3.2 oz (80.8 kg)    Physical Exam Vitals and nursing note reviewed.  Constitutional:      General: He is not in acute distress.    Appearance: Normal appearance. He is  well-developed and well-groomed. He is obese. He is not ill-appearing, toxic-appearing or diaphoretic.  HENT:     Head: Normocephalic and atraumatic.     Jaw: There is normal jaw occlusion.     Right Ear: Hearing normal.     Left Ear: Hearing normal.     Nose: Nose normal.     Mouth/Throat:     Lips: Pink.     Mouth: Mucous membranes are moist.     Pharynx: Oropharynx is clear. Uvula midline.  Eyes:     General: Lids are normal.     Extraocular Movements: Extraocular movements intact.     Conjunctiva/sclera: Conjunctivae normal.     Pupils: Pupils are equal, round, and reactive to light.  Neck:     Thyroid: No thyroid mass, thyromegaly or thyroid tenderness.     Vascular: No carotid bruit or JVD.     Trachea: Trachea and phonation normal.  Cardiovascular:     Rate and Rhythm: Normal rate. Rhythm irregularly irregular.     Chest Wall: PMI is not displaced.     Pulses: Normal pulses.     Heart sounds: Normal heart sounds. No murmur heard.    No friction rub. No gallop.     Comments: Edema to mid shins Pulmonary:     Effort: Pulmonary effort is normal. No respiratory distress.     Breath sounds: Normal breath sounds. No wheezing, rhonchi or rales.  Chest:     Comments: Pacemaker pocket well healed Abdominal:     General: Bowel sounds are normal. There is no distension or abdominal bruit.     Palpations: Abdomen is soft. There is no hepatomegaly or splenomegaly.     Tenderness: There is no abdominal tenderness. There is no right CVA tenderness or left CVA tenderness.     Hernia: No hernia is present.  Musculoskeletal:        General: Normal range of motion.     Cervical back: Normal range of motion and neck supple.     Right lower leg: 1+ Edema present.     Left lower leg: 1+ Edema present.  Lymphadenopathy:     Cervical: No cervical adenopathy.  Skin:    General: Skin is warm and dry.     Capillary Refill: Capillary refill takes less than 2 seconds.     Coloration: Skin is  not cyanotic, jaundiced or pale.     Findings: No rash.  Neurological:     General: No focal deficit present.     Mental Status: He is alert and oriented to person, place, and time.     Sensory: Sensation is intact.     Motor: Motor function is intact.     Coordination: Coordination is intact.     Gait: Gait is intact.     Peter Tendon Reflexes: Reflexes are normal and symmetric.  Psychiatric:        Attention and Perception: Attention and  perception normal.        Mood and Affect: Mood and affect normal.        Speech: Speech normal.        Behavior: Behavior normal. Behavior is cooperative.        Thought Content: Thought content normal.        Cognition and Memory: Cognition and memory normal.        Judgment: Judgment normal.     Results for orders placed or performed in visit on 02/23/23  CUP PACEART [facility] DEVICE CHECK   Collection Time: 02/23/23  5:35 PM  Result Value Ref Range   Date Time Interrogation Session [id number]    Pulse Generator Manufacturer [employer]    Pulse Gen Model 2272 Assurity MRI    Pulse Gen Serial Number [id number]    Clinic Name [facility]    Implantable Pulse Generator Type Implantable Pulse Generator    Implantable Pulse Generator Implant Date 57322025    Implantable Lead Manufacturer [facility]    Implantable Lead Model 1646T IsoFlex S    Implantable Lead Serial Number [id number]    Implantable Lead Implant Date 42706237    Implantable Lead Location Detail 1 UNKNOWN    Implantable Lead Location [id number]    Implantable Lead Connection Status [id number]    Implantable Lead Manufacturer [employer]    Implantable Lead Model 1242T Passive Plus    Implantable Lead Serial Number [id number]    Implantable Lead Implant Date 62831517    Implantable Lead Location Detail 1 UNKNOWN    Implantable Lead Location [id number]    Implantable Lead Connection Status [id number]    Lead Channel Setting Sensing Sensitivity 2.0 mV   Lead Channel Setting Pacing Pulse Width 0.4 ms   Lead  Channel Setting Pacing Amplitude 2.5 V   Lead Channel Impedance Value 637.5 ohm   Lead Channel Sensing Intrinsic Amplitude 12.0 mV   Lead Channel Pacing Threshold Amplitude 0.5 V   Lead Channel Pacing Threshold Pulse Width 0.4 ms   Lead Channel Pacing Threshold Amplitude 0.5 V   Lead Channel Pacing Threshold Pulse Width 0.4 ms   Battery Status Unknown    Battery Remaining Longevity 93 mo   Battery Voltage 3.01 V   Brady Statistic RA Percent Paced 0 %   Brady Statistic RV Percent Paced 56.0 %       Pertinent labs & imaging results that were available during my care of the patient were reviewed by me and considered in my medical decision making.  Assessment & Plan:  Rickye was seen today for insomnia.  Diagnoses and all orders for this visit:  Primary insomnia -     zolpidem (AMBIEN) 5 MG tablet; Take 1 tablet (5 mg total) by mouth at bedtime.  Aortic atherosclerosis (HCC) -     BMP8+EGFR -     BMP8+EGFR; Future  Permanent atrial fibrillation (HCC) -     BMP8+EGFR -     BMP8+EGFR; Future  Essential hypertension with goal blood pressure less than 130/80 -     BMP8+EGFR -     BMP8+EGFR; Future  Panic attacks -     sertraline (ZOLOFT) 25 MG tablet; Take 1 tablet (25 mg total) by mouth daily.  Pitting edema -     BMP8+EGFR -     Brain natriuretic peptide -     furosemide (LASIX) 20 MG tablet; Take 1 tablet (20 mg total) by mouth daily. -     BMP8+EGFR; Future     Assessment  and Plan    Fluid Retention Reports a weight gain of six pounds in one week without changes in routine. Noted to have 1+ pitting edema in the ankles, particularly in the late afternoon. No shortness of breath, chest pain, or significant fatigue. Renal and liver function tests from November were normal. Echocardiogram scheduled for mid-January to assess heart function. BNP to be checked to evaluate fluid status. Discussed starting low-dose Lasix to manage fluid retention and the need to monitor  potassium and kidney function. - Start Lasix 20 mg in the morning - Order blood work today - Schedule follow-up lab work in two weeks to check BMP - Advise fluid intake of two liters or less per day - Advise sodium intake of 2000 mg or less per day  Atrial Fibrillation Currently on a beta blocker and Xarelto. No episodes of uncontrolled heart rate, chest pain, or confusion reported. No abnormal bleeding or bruising noted with Xarelto use. Discussed the importance of rate control to prevent heart muscle weakening and fluid backup. - Continue current medications: beta blocker, Xarelto  Prostate Issues Scheduled for a CT scan and a follow-up test with a urologist in mid-January to evaluate urinary symptoms and prostate health. - Proceed with scheduled CT scan and urologist appointment  General Health Maintenance Doing well on sertraline (Zoloft) and Ambien with no reported side effects or panic attacks. Requires refill for sertraline. - Refill sertraline prescription  Follow-up - Schedule follow-up appointment in four months.          Continue all other maintenance medications.  Follow up plan: Return in about 4 months (around 07/04/2023), or if symptoms worsen or fail to improve, for chronic follow up.   Continue healthy lifestyle choices, including diet (rich in fruits, vegetables, and lean proteins, and low in salt and simple carbohydrates) and exercise (at least 30 minutes of moderate physical activity daily).  Educational handout given for insomnia  The above assessment and management plan was discussed with the patient. The patient verbalized understanding of and has agreed to the management plan. Patient is aware to call the clinic if they develop any new symptoms or if symptoms persist or worsen. Patient is aware when to return to the clinic for a follow-up visit. Patient educated on when it is appropriate to go to the emergency department.   Kari Baars, FNP-C [facility] [phone]

## 2023-03-06 LAB — BMP8+EGFR
BUN/Creatinine Ratio: 16 (ref 10–24)
BUN: 16 mg/dL (ref 8–27)
CO2: 24 mmol/L (ref 20–29)
Calcium: 9.4 mg/dL (ref 8.6–10.2)
Chloride: 103 mmol/L (ref 96–106)
Creatinine, Ser: 1.01 mg/dL (ref 0.76–1.27)
Glucose: 93 mg/dL (ref 70–99)
Potassium: 5.1 mmol/L (ref 3.5–5.2)
Sodium: 141 mmol/L (ref 134–144)
eGFR: 74 mL/min/{1.73_m2} (ref >59)

## 2023-03-06 LAB — BRAIN NATRIURETIC PEPTIDE: BNP: 179.4 pg/mL — ABNORMAL HIGH (ref 0.0–100.0)

## 2023-03-09 ENCOUNTER — Ambulatory Visit: Payer: Self-pay | Admitting: Urology

## 2023-03-15 ENCOUNTER — Ambulatory Visit: Payer: Medicare HMO | Admitting: Urology

## 2023-03-19 ENCOUNTER — Other Ambulatory Visit: Payer: Medicare HMO

## 2023-03-19 DIAGNOSIS — R609 Edema, unspecified: Secondary | ICD-10-CM

## 2023-03-19 DIAGNOSIS — I4821 Permanent atrial fibrillation: Secondary | ICD-10-CM | POA: Diagnosis not present

## 2023-03-19 DIAGNOSIS — I10 Essential (primary) hypertension: Secondary | ICD-10-CM | POA: Diagnosis not present

## 2023-03-19 DIAGNOSIS — I700 Atherosclerosis of aorta: Secondary | ICD-10-CM

## 2023-03-20 LAB — BMP8+EGFR
BUN/Creatinine Ratio: 14 (ref 10–24)
BUN: 15 mg/dL (ref 8–27)
CO2: 21 mmol/L (ref 20–29)
Calcium: 9.5 mg/dL (ref 8.6–10.2)
Chloride: 102 mmol/L (ref 96–106)
Creatinine, Ser: 1.06 mg/dL (ref 0.76–1.27)
Glucose: 99 mg/dL (ref 70–99)
Potassium: 4.3 mmol/L (ref 3.5–5.2)
Sodium: 138 mmol/L (ref 134–144)
eGFR: 70 mL/min/{1.73_m2} (ref >59)

## 2023-03-22 ENCOUNTER — Ambulatory Visit: Payer: Medicare HMO | Admitting: Orthopedic Surgery

## 2023-03-24 ENCOUNTER — Ambulatory Visit (HOSPITAL_COMMUNITY): Payer: Medicare HMO | Attending: Cardiology

## 2023-03-24 DIAGNOSIS — R0602 Shortness of breath: Secondary | ICD-10-CM | POA: Insufficient documentation

## 2023-03-24 LAB — ECHOCARDIOGRAM COMPLETE
Calc EF: 63.8 %
S' Lateral: 2.66 cm
Single Plane A2C EF: 64.1 %
Single Plane A4C EF: 63.3 %

## 2023-03-25 ENCOUNTER — Other Ambulatory Visit: Payer: Self-pay | Admitting: Family Medicine

## 2023-03-25 ENCOUNTER — Ambulatory Visit (HOSPITAL_COMMUNITY)
Admission: RE | Admit: 2023-03-25 | Discharge: 2023-03-25 | Disposition: A | Discharge status: Home / Self Care | Payer: Medicare HMO | Source: Ambulatory Visit | Attending: Urology | Admitting: Urology

## 2023-03-25 DIAGNOSIS — Z87891 Personal history of nicotine dependence: Secondary | ICD-10-CM | POA: Insufficient documentation

## 2023-03-25 DIAGNOSIS — K5750 Diverticulosis of both small and large intestine without perforation or abscess without bleeding: Secondary | ICD-10-CM | POA: Diagnosis not present

## 2023-03-25 DIAGNOSIS — R3129 Other microscopic hematuria: Secondary | ICD-10-CM | POA: Diagnosis not present

## 2023-03-25 DIAGNOSIS — N400 Enlarged prostate without lower urinary tract symptoms: Secondary | ICD-10-CM | POA: Diagnosis not present

## 2023-03-25 DIAGNOSIS — K8020 Calculus of gallbladder without cholecystitis without obstruction: Secondary | ICD-10-CM | POA: Diagnosis not present

## 2023-03-25 MED ORDER — IOHEXOL 300 MG/ML  SOLN
125.0000 mL | Freq: Once | INTRAMUSCULAR | Status: AC | PRN
  Administered 2023-03-25: 125 mL via INTRAVENOUS

## 2023-04-01 ENCOUNTER — Encounter: Payer: Self-pay | Admitting: Urology

## 2023-04-01 ENCOUNTER — Ambulatory Visit: Payer: Medicare HMO | Admitting: Urology

## 2023-04-01 VITALS — BP 133/71 | HR 81

## 2023-04-01 DIAGNOSIS — R3129 Other microscopic hematuria: Secondary | ICD-10-CM | POA: Diagnosis not present

## 2023-04-01 DIAGNOSIS — R350 Frequency of micturition: Secondary | ICD-10-CM | POA: Diagnosis not present

## 2023-04-01 DIAGNOSIS — N401 Enlarged prostate with lower urinary tract symptoms: Secondary | ICD-10-CM | POA: Diagnosis not present

## 2023-04-01 DIAGNOSIS — R351 Nocturia: Secondary | ICD-10-CM

## 2023-04-01 LAB — URINALYSIS, ROUTINE W REFLEX MICROSCOPIC
Bilirubin, UA: NEGATIVE
Ketones, UA: NEGATIVE
Leukocytes,UA: NEGATIVE
Nitrite, UA: NEGATIVE
Specific Gravity, UA: 1.01 (ref 1.005–1.030)
Urobilinogen, Ur: 0.2 mg/dL (ref 0.2–1.0)
pH, UA: 6 (ref 5.0–7.5)

## 2023-04-01 LAB — MICROSCOPIC EXAMINATION
Bacteria, UA: NONE SEEN
WBC, UA: NONE SEEN /HPF (ref 0–5)

## 2023-04-01 MED ORDER — CIPROFLOXACIN HCL 500 MG PO TABS
500.0000 mg | ORAL_TABLET | Freq: Once | ORAL | Status: AC
  Administered 2023-04-01: 500 mg via ORAL

## 2023-04-01 NOTE — Progress Notes (Signed)
Subjective:  1. Microscopic hematuria   2. Benign prostatic hyperplasia with urinary frequency   3. Nocturia     04/01/23: Mr. Trulson returns today in f/u for cystoscopy to evaluate his outlet and complete the hematuria w/u.   The CT hematuria study has not been formally read but on my reviewed the are no significant GU findings apart from prostate enlargement.   He has 3-10 RBC's today.      ROS:  ROS:  A complete review of systems was performed.  All systems are negative except for pertinent findings as noted.   ROS  Allergies  Allergen Reactions   Allopurinol Itching   Morphine And Codeine Other (See Comments)    Patient states it wired him up and didn't help. Prefers not to take again.   Penicillins Rash    Has patient had a PCN reaction causing immediate rash, facial/tongue/throat swelling, SOB or lightheadedness with hypotension: No Has patient had a PCN reaction causing severe rash involving mucus membranes or skin necrosis: No Has patient had a PCN reaction that required hospitalization No Has patient had a PCN reaction occurring within the last 10 years: No If all of the above answers are "NO", then may proceed with Cephalosporin use.    Statins    Sulfonamide Derivatives Rash    Outpatient Encounter Medications as of 04/01/2023  Medication Sig   acetaminophen (TYLENOL) 325 MG tablet Take 650 mg by mouth every 6 (six) hours as needed for mild pain.   atenolol (TENORMIN) 50 MG tablet Take 1 tablet (50 mg total) by mouth daily.   colchicine 0.6 MG tablet Take 1.2 mg (2 tablets) by mouth at the first sign of a gout flare followed by 0.6 mg (1 tablet) one hour later.   furosemide (LASIX) 20 MG tablet Take 1 tablet (20 mg total) by mouth daily.   losartan (COZAAR) 50 MG tablet Take 1 tablet (50 mg total) by mouth daily.   Multiple Vitamin (MULTIVITAMIN WITH MINERALS) TABS tablet Take 1 tablet by mouth daily.   nitroGLYCERIN (NITROSTAT) 0.4 MG SL tablet Place 1 tablet  under the tongue every 5 (five) minutes as needed for chest pain.   rivaroxaban (XARELTO) 20 MG TABS tablet Take 1 tablet (20 mg total) by mouth daily with supper.   rosuvastatin (CRESTOR) 5 MG tablet Take 1 tablet (5 mg total) by mouth once a week.   sertraline (ZOLOFT) 25 MG tablet Take 1 tablet (25 mg total) by mouth daily.   tamsulosin (FLOMAX) 0.4 MG CAPS capsule Take 1 capsule (0.4 mg total) by mouth daily.   zolpidem (AMBIEN) 5 MG tablet Take 1 tablet (5 mg total) by mouth at bedtime.   [EXPIRED] ciprofloxacin (CIPRO) tablet 500 mg    No facility-administered encounter medications on file as of 04/01/2023.    Past Medical History:  Diagnosis Date   Anxiety    on meds   Aortic atherosclerosis (HCC)    Arthritis    generalized   Bilateral inguinal hernia 01/10/2021   Cancer (HCC)    skin cancers removed   Diverticulosis 01/10/2021   GERD (gastroesophageal reflux disease)    on meds   Hyperlipidemia    on meds   Hypertension    on meds   Overweight(278.02)    obesity   Permanent atrial fibrillation (HCC)    Seasonal allergies    Tachycardia-bradycardia (HCC)    s/p PPM    Past Surgical History:  Procedure Laterality Date   ARM SKIN LESION  BIOPSY / EXCISION Left    Melanoma -removed by Dr. Lurline Hare TUNNEL RELEASE Left 2022   PACEMAKER INSERTION  03/28/2003   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  Tobacco Use   Smoking status: Former    Current packs/day: 0.00    Average packs/day: 1 pack/day for 37.9 years (37.9 ttl pk-yrs)     Types: Cigarettes    Start date: 03/30/1955    Quit date: 03/09/1993    Years since quitting: 30.0    Passive exposure: Past   Smokeless tobacco: Never  Vaping Use   Vaping status: Never Used  Substance and Sexual Activity   Alcohol use: Yes    Alcohol/week: 7.0 standard drinks of alcohol    Types: 1 Glasses of wine, 4 Cans of beer, 2 Shots of liquor per week   Drug use: No   Sexual activity: Not Currently    Partners: Female  Other Topics Concern   Not on file  Social History Narrative   Owns a Materials engineer, enjoys golf.   Lives in [place]   Brother lives 5 miles away   Social Drivers of Health   Financial Resource Strain: Low Risk  (06/24/2022)   Overall Financial Resource Strain (CARDIA)    Difficulty of Paying Living Expenses: Not hard at all  Food Insecurity: No Food Insecurity (06/24/2022)   Hunger Vital Sign    Worried About Running Out of Food in the Last Year: Never true    Ran Out of Food in the Last Year: Never true  Transportation Needs: No Transportation Needs (06/24/2022)   PRAPARE - Administrator, Civil Service (Medical): No    Lack of Transportation (Non-Medical): No  Physical Activity: Sufficiently Active (06/24/2022)   Exercise Vital Sign    Days of Exercise per Week: 5 days    Minutes of Exercise per Session: 60 min  Stress: No Stress Concern Present (06/24/2022)   [employer] of Occupational Health - Occupational Stress Questionnaire    Feeling of Stress : Not at all  Social Connections: Moderately Integrated (06/24/2022)   Social Connection and Isolation Panel [NHANES]    Frequency of Communication with Friends and Family: More than three times a week    Frequency of Social Gatherings with Friends and Family: More than three times a week    Attends Religious Services: More than 4 times per year    Active Member of [employer] or Organizations: Yes    Attends Banker Meetings: More than 4 times per year    Marital Status:  Widowed  Intimate Partner Violence: Not At Risk (06/24/2022)   Humiliation, Afraid, Rape, and Kick questionnaire    Fear of Current or Ex-Partner: No    Emotionally Abused: No    Physically Abused: No    Sexually Abused: No    Family History  Problem Relation Age of Onset   Cancer Mother 67       unknown location   CAD Father 52  Breast cancer Sister    Breast cancer Sister    Stroke Sister    Breast cancer Sister    Breast cancer Sister    Diabetes Brother    Cancer Brother    Alcohol abuse Brother    Heart disease Daughter    Colon polyps Neg Hx    Colon cancer Neg Hx    Esophageal cancer Neg Hx    Stomach cancer Neg Hx    Rectal cancer Neg Hx        Objective: Vitals:   04/01/23 1423  BP: 133/71  Pulse: 81     Physical Exam  Lab Results:  PSA No results found for: "PSA" No results found for: "TESTOSTERONE"    Studies/Results: No results found. Results for orders placed during the hospital encounter of 03/25/16  DG Abd 1 View  Narrative CLINICAL DATA:  Nausea up.  Weakness.  EXAM: ABDOMEN - 1 VIEW  COMPARISON:  03/26/2016  FINDINGS: Nasogastric tube tip is in the gastric antrum. Gas pattern does not suggest ileus or obstruction. No dilated small bowel loops are seen. Overall abdominal opacity secondary to a large amount of intraabdominal fat as shown by CT.  IMPRESSION: Nasogastric tube in the gastric antrum. Gas pattern unremarkable today.   Electronically Signed By: Paulina Fusi M.D. On: 03/28/2016 08:49  No results found for this or any previous visit.  No results found for this or any previous visit.  No results found for this or any previous visit.  No results found for this or any previous visit.  No results found for this or any previous visit.  No results found for this or any previous visit.  Results for orders placed during the hospital encounter of 03/04/22  CT RENAL STONE STUDY  Narrative CLINICAL DATA:   Right flank pain for 7 days.  Microscopic hematuria.  EXAM: CT ABDOMEN AND PELVIS WITHOUT CONTRAST  TECHNIQUE: Multidetector CT imaging of the abdomen and pelvis was performed following the standard protocol without IV contrast.  RADIATION DOSE REDUCTION: This exam was performed according to the departmental dose-optimization program which includes automated exposure control, adjustment of the mA and/or kV according to patient size and/or use of iterative reconstruction technique.  COMPARISON:  01/10/2021  FINDINGS: Lower chest: Pacer leads noted, including 2 ventricular leads, one of these may reflect an abandoned lead. Mild cardiomegaly with right coronary artery and aortic atherosclerosis and mitral valve calcification. Mild scarring in the posterior basal segment right lower lobe.  Hepatobiliary: Contracted gallbladder associated calcifications favoring gallstones as on image 25 series 2. No overt extrahepatic biliary dilatation. The  Pancreas: Unremarkable  Spleen: Unremarkable  Adrenals/Urinary Tract: Both adrenal glands appear normal. Mild prominence of renal sinus adipose tissues along with mild bilateral renal atrophy. No urinary tract calculi or hydronephrosis. The right anterior margin of the urinary bladder extends towards the right inguinal hernia but does not definitively herniate beyond the inguinal ring.  Stomach/Bowel: Periampullary duodenal diverticulum. Normal appendix. Scattered sigmoid colon diverticula.  Vascular/Lymphatic: Atherosclerosis is present, including aortoiliac atherosclerotic disease. No pathologic adenopathy. There is atheromatous plaque at the origin of the SMA.  Reproductive: Prostate gland 5.4 by 4.2 by 4.7 cm (volume = 56 cm^3), compatible with mild prostatomegaly. Mildly indistinct prostate margins, correlate clinically in assessing for prostatitis.  Other: No supplemental non-categorized findings.  Musculoskeletal: Notable  right groin hernia containing adipose tissues, probably a direct inguinal hernia although this is not completely certain. Part of the sigmoid colon and part of the  urinary bladder extend towards the hernia but do not pass beyond the inguinal ring. There is also a fatty left spermatic cord which may be due to an indirect left inguinal hernia.  Spurring of the sacroiliac joints. Lower thoracic and lumbar spondylosis and degenerative disc disease with grade 1 degenerative anterolisthesis at L4-5 contributing right foraminal impingement at the L4-5 level.  IMPRESSION: 1. A cause for the patient's right flank pain is not identified. No urinary tract calculi or hydronephrosis. 2. Notable right groin hernia containing adipose tissues, probably a direct inguinal hernia although this is not completely certain. Part of the sigmoid colon and part of the urinary bladder extend towards the hernia but do not pass beyond the inguinal ring. 3. There is also a fatty left spermatic cord which may be due to an indirect left inguinal hernia. 4. Mild cardiomegaly with right coronary artery and aortic atherosclerosis. Mitral valve calcification. 5. Cholelithiasis. 6. Periampullary duodenal diverticulum. 7. Scattered sigmoid colon diverticula. 8. Lower thoracic and lumbar spondylosis and degenerative disc disease with grade 1 degenerative anterolisthesis at L4-5 contributing to right foraminal impingement at the L4-5 level. 9. Mild prostatomegaly. Mildly indistinct prostate margins, correlate clinically in assessing for prostatitis.  Aortic Atherosclerosis (ICD10-I70.0).   Electronically Signed By: Gaylyn Rong M.D. On: 03/04/2022 14:08  I have reviewed his CT hematuria study but the report is pending.  Procedure: Cystoscopy  He was prepped with betadine and 2% lidocaine.  Cipro 500mg  po given.  The urethra is normal.  The prostate has moderate bilobar hyperplasia with coaptation.   Bladder wall has mild trabeculation without mucosal lesions.  UO's are normal.    Assessment & Plan: Microhematuria.   The w/u is negative.   He can return as needed.    Meds ordered this encounter  Medications   ciprofloxacin (CIPRO) tablet 500 mg     Orders Placed This Encounter  Procedures   Microscopic Examination   Urinalysis, Routine w reflex microscopic   Cystoscopy      Return if symptoms worsen or fail to improve.   CC: Peter Masters, FNP      Bjorn Pippin 04/02/2023

## 2023-04-21 ENCOUNTER — Other Ambulatory Visit: Payer: Self-pay | Admitting: Family Medicine

## 2023-05-04 ENCOUNTER — Ambulatory Visit (INDEPENDENT_AMBULATORY_CARE_PROVIDER_SITE_OTHER): Payer: Medicare HMO

## 2023-05-04 DIAGNOSIS — R001 Bradycardia, unspecified: Secondary | ICD-10-CM

## 2023-05-05 LAB — CUP PACEART REMOTE DEVICE CHECK
Battery Remaining Longevity: 92 mo
Battery Remaining Percentage: 70 %
Battery Voltage: 3.01 V
Brady Statistic RV Percent Paced: 64 %
Date Time Interrogation Session: 20250225145904
Implantable Lead Connection Status: 753985
Implantable Lead Connection Status: 753985
Implantable Lead Implant Date: 19980120
Implantable Lead Implant Date: 20050119
Implantable Lead Location: 753859
Implantable Lead Location: 753860
Implantable Pulse Generator Implant Date: 20200917
Lead Channel Impedance Value: 680 Ohm
Lead Channel Pacing Threshold Amplitude: 0.5 V
Lead Channel Pacing Threshold Pulse Width: 0.4 ms
Lead Channel Sensing Intrinsic Amplitude: 12 mV
Lead Channel Setting Pacing Amplitude: 2.5 V
Lead Channel Setting Pacing Pulse Width: 0.4 ms
Lead Channel Setting Sensing Sensitivity: 2 mV
Pulse Gen Model: 2272
Pulse Gen Serial Number: 9154140

## 2023-05-11 ENCOUNTER — Encounter: Payer: Self-pay | Admitting: Cardiology

## 2023-05-17 ENCOUNTER — Ambulatory Visit (INDEPENDENT_AMBULATORY_CARE_PROVIDER_SITE_OTHER): Admitting: Nurse Practitioner

## 2023-05-17 ENCOUNTER — Ambulatory Visit: Payer: Self-pay | Admitting: Family Medicine

## 2023-05-17 ENCOUNTER — Encounter: Payer: Self-pay | Admitting: Nurse Practitioner

## 2023-05-17 VITALS — BP 135/70 | HR 63 | Temp 97.0°F | Ht 64.0 in | Wt 185.4 lb

## 2023-05-17 DIAGNOSIS — R197 Diarrhea, unspecified: Secondary | ICD-10-CM | POA: Insufficient documentation

## 2023-05-17 DIAGNOSIS — R0981 Nasal congestion: Secondary | ICD-10-CM | POA: Insufficient documentation

## 2023-05-17 MED ORDER — AZELASTINE HCL 0.1 % NA SOLN
1.0000 | Freq: Two times a day (BID) | NASAL | 12 refills | Status: AC
Start: 2023-05-17 — End: ?

## 2023-05-17 MED ORDER — LOPERAMIDE HCL 2 MG PO TABS: 2.0000 mg | ORAL_TABLET | Freq: Four times a day (QID) | ORAL | 0 refills | Status: AC | PRN

## 2023-05-17 NOTE — Telephone Encounter (Signed)
  Chief Complaint: diarrhea Symptoms: diarrhea, cough, dry mouth Frequency: 7 days Pertinent Negatives: Patient denies CP, SOB, fever Disposition: [] ED /[] Urgent Care (no appt availability in office) / [x] Appointment(In office/virtual)/ []  [facility]/ [] Home Care/ [] Refused Recommended Disposition /[] [facility]/ []  Follow-up with PCP Additional Notes: Patient calls reporting diarrhea x 7 days. States stools are very loose, reports normal appetite and fluid intake. Per protocol, patient to be evaluated within 24 hours. First available appointment with PCP outside of guideline. Patient scheduled with first available provider in clinic for today at 1100. Care advice reviewed, patient verbalized understandingand denies further questions at this time. Alerting PCP for review.    Reason for Disposition  [1] MODERATE diarrhea (e.g., 4-6 times / day more than normal) AND [2] present > 48 hours (2 days)  Answer Assessment - Initial Assessment Questions 1. DIARRHEA SEVERITY: "How bad is the diarrhea?" "How many more stools have you had in the past 24 hours than normal?"    - NO DIARRHEA (SCALE 0)   - MILD (SCALE 1-3): Few loose or mushy BMs; increase of 1-3 stools over normal daily number of stools; mild increase in ostomy output.   -  MODERATE (SCALE 4-7): Increase of 4-6 stools daily over normal; moderate increase in ostomy output.   -  SEVERE (SCALE 8-10; OR "WORST POSSIBLE"): Increase of 7 or more stools daily over normal; moderate increase in ostomy output; incontinence.     4 times 2. ONSET: "When did the diarrhea begin?"      7 days 3. BM CONSISTENCY: "How loose or watery is the diarrhea?"      loose 4. VOMITING: "Are you also vomiting?" If Yes, ask: "How many times in the past 24 hours?"      Denies 5. ABDOMEN PAIN: "Are you having any abdomen pain?" If Yes, ask: "What does it feel like?" (e.g., crampy, dull, intermittent, constant)      Denies 6. ABDOMEN PAIN  SEVERITY: If present, ask: "How bad is the pain?"  (e.g., Scale 1-10; mild, moderate, or severe)   - MILD (1-3): doesn't interfere with normal activities, abdomen soft and not tender to touch    - MODERATE (4-7): interferes with normal activities or awakens from sleep, abdomen tender to touch    - SEVERE (8-10): excruciating pain, doubled over, unable to do any normal activities       Denies 7. ORAL INTAKE: If vomiting, "Have you been able to drink liquids?" "How much liquids have you had in the past 24 hours?"     Yes, drinking water and gatorade 8. HYDRATION: "Any signs of dehydration?" (e.g., dry mouth [not just dry lips], too weak to stand, dizziness, new weight loss) "When did you last urinate?"     Dry mouth at night 9. EXPOSURE: "Have you traveled to a foreign country recently?" "Have you been exposed to anyone with diarrhea?" "Could you have eaten any food that was spoiled?"     Denies 10. ANTIBIOTIC USE: "Are you taking antibiotics now or have you taken antibiotics in the past 2 months?"       Denies 11. OTHER SYMPTOMS: "Do you have any other symptoms?" (e.g., fever, blood in stool)       cough  Protocols used: Diarrhea-A-AH

## 2023-05-17 NOTE — Progress Notes (Signed)
 Acute Office Visit  Subjective:     Patient ID: Peter Howe, male    DOB: May 07, 1940, 83 y.o.   MRN: [id number]  Chief Complaint  Patient presents with   Diarrhea    Symptoms for a week   Cough   Nasal Congestion    HPI Peter Howe is a 83 yrs old male presents 05/17/2023 for an acute visit concerns for diarrhea and congestion Symptoms started 1-week ago diarrhea, ha cold and chest congest tooOTC sinus medicationstio n  Nasal congestion:  Presents with nasal congestion for the past seven days that has not been relieved with over-the-counter medications. The patient reports clear nasal discharge but denies fever, cough, sore throat, facial pain, or pressure. No history of recent illness, known allergies, or significant environmental exposures. No difficulty breathing or wheezing. The patient is seeking evaluation for persistent symptoms and possible alternative treatment options.  .Diarrhea: Patient complains of diarrhea. Onset of diarrhea was a week ago. Diarrhea is occurring approximately 5 times per day. Patient describes diarrhea as watery. Diarrhea has been associated with  nasal congestion .  Patient denies blood in stool, fever, recent antibiotic use, recent camping, recent travel, significant abdominal pain, unintentional weight loss.  Previous visits for diarrhea: none. Evaluation to date: none. 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/15/2020   Aortic atherosclerosis (HCC) 06/24/2020   Bilateral inguinal  hernia 01/10/2021   Lumbar spondylosis 03/10/2022   PVD (peripheral vascular disease) (HCC) 04/30/2022   Panic attacks 04/30/2022   Chronic pain of both shoulders 09/03/2022   Bilateral wrist pain 09/03/2022   Controlled gout 01/15/2023   Nocturia 01/15/2023   Bilateral hand pain 01/15/2023   Benign prostatic hyperplasia with urinary frequency 02/16/2023   Former smoker 02/18/2023   Diarrhea 05/17/2023   Nasal congestion 05/17/2023   Resolved Ambulatory Problems    Diagnosis Date Noted   BMI 33.0-33.9,adult 12/05/2008   Essential hypertension 12/05/2008   PACEMAKER, PERMANENT 12/05/2008   Shortness of breath 08/31/2012   Nonspecific chest pain 11/05/2015   Abdominal pain 11/05/2015   Hyperglycemia 03/26/2016   Abnormal EKG    Non-intractable vomiting with nausea    Abdominal distension    Epigastric pain 06/04/2017   Forearm mass, left 06/08/2019   No-show for appointment 10/29/2020   Past Medical History:  Diagnosis Date   Anxiety    Arthritis    Cancer (HCC)    Diverticulosis 01/10/2021   GERD (gastroesophageal reflux disease)    Hyperlipidemia    Hypertension    Overweight(278.02)    Permanent atrial fibrillation (HCC)    Seasonal allergies    Tachycardia-bradycardia (HCC)     Review of Systems  Constitutional:  Negative for chills and fever.  HENT:  Positive for congestion.   Respiratory:  Negative for cough and shortness of breath.   Cardiovascular:  Negative for chest pain and leg swelling.  Gastrointestinal:  Positive for diarrhea. Negative for vomiting.       5 times daily for 7 days  Musculoskeletal:  Negative for myalgias.  Skin:  Negative  for itching and rash.  Neurological:  Negative for dizziness and headaches.   Negative unless indicated in HPI   Objective:    BP 135/70   Pulse 63   Temp (!) 97 F (36.1 C) (Temporal)   Ht 5\' 4"  (1.626 m)   Wt 185 lb 6.4 oz (84.1 kg)   SpO2 97%   BMI 31.82 kg/m  BP Readings from Last 3 Encounters:   05/17/23 135/70  04/01/23 133/71  03/05/23 134/76   Wt Readings from Last 3 Encounters:  05/17/23 185 lb 6.4 oz (84.1 kg)  03/05/23 184 lb 3.2 oz (83.6 kg)  02/23/23 184 lb 12.8 oz (83.8 kg)      Physical Exam Vitals and nursing note reviewed.  Constitutional:      General: He is not in acute distress.    Appearance: Normal appearance.  HENT:     Head: Normocephalic and atraumatic.     Right Ear: Tympanic membrane, ear canal and external ear normal. There is no impacted cerumen.     Left Ear: Tympanic membrane, ear canal and external ear normal. There is no impacted cerumen.     Nose: Congestion present. No rhinorrhea.     Right Sinus: No maxillary sinus tenderness or frontal sinus tenderness.     Left Sinus: No maxillary sinus tenderness or frontal sinus tenderness.     Mouth/Throat:     Mouth: Mucous membranes are moist.  Eyes:     General: No scleral icterus.    Extraocular Movements: Extraocular movements intact.     Conjunctiva/sclera: Conjunctivae normal.     Pupils: Pupils are equal, round, and reactive to light.  Cardiovascular:     Heart sounds: Normal heart sounds.  Pulmonary:     Effort: Pulmonary effort is normal.     Breath sounds: Normal breath sounds.  Musculoskeletal:        General: Normal range of motion.     Right lower leg: No edema.     Left lower leg: No edema.  Skin:    General: Skin is warm and dry.     Findings: No rash.  Neurological:     Mental Status: He is alert and oriented to person, place, and time.  Psychiatric:        Mood and Affect: Mood normal.        Behavior: Behavior normal.        Thought Content: Thought content normal.        Judgment: Judgment normal.     No results found for any visits on 05/17/23.      Assessment & Plan:  Diarrhea, unspecified type -     Cdiff NAA+O+P+Stool Culture -     Loperamide HCl; Take 1 tablet (2 mg total) by mouth 4 (four) times daily as needed for diarrhea or loose stools.  Dispense:  30 tablet; Refill: 0  Nasal congestion -     Azelastine HCl; Place 1 spray into both nostrils 2 (two) times daily. Use in each nostril as directed  Dispense: 30 mL; Refill: 12   [name] is 83 yrs old Caucasian male seen today for viral illness, no acute distress Diarrhea: Imodium BID, BLAND diet, WASH Hands Congestion: Astelin BID, Humidifier  The above assessment and management plan was discussed with the patient. The patient verbalized understanding of and has agreed to the management plan. Patient is aware to call the clinic if they develop any new symptoms or if symptoms persist or worsen. Patient is aware when  to return to the clinic for a follow-up visit. Patient educated on when it is appropriate to go to the emergency department.  Return if symptoms worsen or fail to improve.  Arrie Aran Santa Lighter, [facility] [address] [place], [place] [address] [phone]  Note: This document was prepared by [employer] voice dictation technology and any errors that results from this process are unintentional.

## 2023-05-18 ENCOUNTER — Other Ambulatory Visit: Payer: Self-pay | Admitting: Family Medicine

## 2023-05-18 DIAGNOSIS — L821 Other seborrheic keratosis: Secondary | ICD-10-CM | POA: Diagnosis not present

## 2023-05-18 DIAGNOSIS — R350 Frequency of micturition: Secondary | ICD-10-CM

## 2023-05-24 ENCOUNTER — Other Ambulatory Visit: Payer: Self-pay | Admitting: Family Medicine

## 2023-05-24 DIAGNOSIS — R609 Edema, unspecified: Secondary | ICD-10-CM

## 2023-05-27 ENCOUNTER — Other Ambulatory Visit: Payer: Self-pay | Admitting: Cardiology

## 2023-05-27 DIAGNOSIS — I4821 Permanent atrial fibrillation: Secondary | ICD-10-CM

## 2023-05-27 NOTE — Telephone Encounter (Signed)
 Pt last saw Francis Dowse, [place] on 02/23/23, last labs 03/19/23 Creat 1.06, age 83, weight 84.1kg, CrCl 62.81, based on CrCl pt is on appropriate dosage of Xarelto 20mg  every day for afib.  Will refill rx.

## 2023-06-08 ENCOUNTER — Ambulatory Visit: Admitting: Orthopedic Surgery

## 2023-06-08 DIAGNOSIS — M19139 Post-traumatic osteoarthritis, unspecified wrist: Secondary | ICD-10-CM

## 2023-06-08 DIAGNOSIS — M180 Bilateral primary osteoarthritis of first carpometacarpal joints: Secondary | ICD-10-CM | POA: Diagnosis not present

## 2023-06-08 MED ORDER — BETAMETHASONE SOD PHOS & ACET 6 (3-3) MG/ML IJ SUSP
6.0000 mg | INTRAMUSCULAR | Status: AC | PRN
  Administered 2023-06-08: 6 mg via INTRA_ARTICULAR

## 2023-06-08 MED ORDER — LIDOCAINE HCL 1 % IJ SOLN
1.0000 mL | INTRAMUSCULAR | Status: AC | PRN
  Administered 2023-06-08: 1 mL

## 2023-06-08 NOTE — Progress Notes (Signed)
 Peter Howe - 83 y.o. male MRN [id number]  Date of birth: Jun 18, 1940  Office Visit Note: Visit Date: 06/08/2023 PCP: Sonny Masters, FNP Referred by: Sonny Masters, FNP  Subjective: No chief complaint on file.  HPI: Peter Howe is a pleasant 83 y.o. male who presents today for follow-up of bilateral wrist pain and bilateral basilar thumb pain, present for multiple months.  At his prior visit in October of last year, he underwent bilateral cortisone injection to the radiocarpal joint with moderate relief.  Today, his major complaint is basal joint pain at the bilateral thumbs, this has not been formally treated.  He has been utilizing wrist bracing periodically which does include the thumbs, he states he gets moderate relief from this as well.  Pertinent ROS were reviewed with the patient and found to be negative unless otherwise specified above in HPI.   Visit Reason:bilateral hands Duration of symptoms: 3+ months Hand dominance: right Occupation: Retired Diabetic: No Smoking: No Heart/Lung History:Pacemaker Blood Thinners: Xarelto  Prior Testing/EMG: 12/17/22 xrays Injections (Date): October 2024, bilateral radiocarpal joint Treatments: none Prior Surgery:none  Assessment & Plan: Visit Diagnoses:  1. Primary osteoarthritis of both first carpometacarpal joints   2. Scapholunate advanced collapse of wrist, unspecified laterality     Plan: Extensive discussion was once again had with patient today regarding his bilateral SLAC wrist arthritis and bilateral thumb CMC arthritis.  He has significant arthritic changes at the radial scaphoid interface as well as the radiolunate interface.  On the left wrist, there is notable degeneration at the capitate surface as well.  I discussed a variety of options with him ranging from conservative to surgical.  From a conservative standpoint, we discussed ongoing utilization of braces and cortisone injection for pain control.  We  also discussed anti-inflammatory medication to be utilized as needed.  From a surgical standpoint, we discussed that the slack wrist and bilateral thumb CMC arthritis is quite significant, these could be addressed in conjunction surgically, however if we can isolate 1 versus the other as a more symptomatic region, then this may help speed up recovery postoperatively if we address one of the above.  We discussed utilization of bilateral thumb CMC injections today for both diagnostic and therapeutic measures.  Once again, risk and benefits of cortisone injection were discussed in detail, patient agreed to proceed understanding the above.  Injections were performed to bilateral thumb CMC joints without incident.  I have recommended that he return in approximate 3 months for repeat discussion and recheck.  Follow-up: No follow-ups on file.   Meds & Orders: No orders of the defined types were placed in this encounter.  No orders of the defined types were placed in this encounter.    Procedures: Hand/UE Inj: bilateral thumb CMC for osteoarthritis on 06/08/2023 9:00 AM Indications: pain Details: 25 G needle Medications (Right): 1 mL lidocaine 1 %; 6 mg betamethasone acetate-betamethasone sodium phosphate 6 (3-3) MG/ML Medications (Left): 1 mL lidocaine 1 %; 6 mg betamethasone acetate-betamethasone sodium phosphate 6 (3-3) MG/ML Outcome: tolerated well, no immediate complications Procedure, treatment alternatives, risks and benefits explained, specific risks discussed.          Clinical History: CT CERVICAL SPINE WITHOUT CONTRAST   TECHNIQUE: Multidetector CT imaging of the cervical spine was performed without intravenous contrast. Multiplanar CT image reconstructions were also generated.   RADIATION DOSE REDUCTION: This exam was performed according to the departmental dose-optimization program which includes automated exposure control, adjustment of the  mA and/or kV according to patient size  and/or use of iterative reconstruction technique.   COMPARISON:  None Available.   FINDINGS: Alignment: Unremarkable   Skull base and vertebrae: No acute fracture. No primary bone lesion or focal pathologic process. Subjective osteopenia   Soft tissues and spinal canal: No evidence of soft tissue mass or acute inflammation.   Disc levels:   C2-3: Facet and uncovertebral spurring on the right where there is moderate foraminal stenosis.   C3-4: Degenerative facet spurring on both sides. Spondylosis. Mild bilateral foraminal narrowing   C4-5: Spondylosis with mild facet spurring. Patent foramina. There may be a central protrusion at this level but no high-grade spinal stenosis suggested on sagittal reformats   C5-6: Spondylosis with mainly ventral spurring. Facet spurring is mild. Patent canal and foramina   C6-7: Greatest level of degenerative disc narrowing with endplate ridging and uncovertebral spurring. Mild right foraminal stenosis   C7-T1:Unremarkable.   Upper chest: No acute finding   IMPRESSION: 1. Ordinary and generalized cervical spine degeneration as described. 2. Moderate foraminal narrowing on the right at C2-3. Patent appearance of the canal and foramina at the highlighted C4-5 level.     Electronically Signed   By: Tiburcio Pea M.D.   On: 08/25/2022 05:34  He reports that he quit smoking about 30 years ago. His smoking use included cigarettes. He started smoking about 68 years ago. He has a 37.9 pack-year smoking history. He has been exposed to tobacco smoke. He has never used smokeless tobacco.  Recent Labs    07/01/22 1118 01/15/23 1542  LABURIC 9.4* 6.2    Objective:   Vital Signs: There were no vitals taken for this visit.  Physical Exam  Gen: Well-appearing, in no acute distress; non-toxic CV: Regular Rate. Well-perfused. Warm.  Resp: Breathing unlabored on room air; no wheezing. Psych: Fluid speech in conversation; appropriate affect;  normal thought process  Ortho Exam PHYSICAL EXAM:  General: Patient is well appearing and in no distress. Cervical spine mobility is full in all directions:  Skin and Muscle: No significant skin changes are apparent to upper extremities.  Muscle bulk and contour normal.  Range of Motion and Palpation Tests: Mobility is full about the elbows with flexion and extension.  Forearm supination and pronation are 65/65 bilaterally.   Wrist flexion/extension is limited bilaterally.  Right wrist 45/15, left wrist 45/15.  Digital flexion and extension are full passively.  Thumb opposition is full to the base of the small fingers bilaterally.    Now cords or nodules are palpated.  No triggering is observed.    Significant tenderness over the thumb CMC articulations is observed, no significant MP hyperextension.  Scaphoid shift test is positive for pain and clunk bilaterally.  Neurologic, Vascular, Motor: Sensation is intact to light touch bilateral hands. Fingers pink and well perfused.  Capillary refill is brisk.      Lab Results  Component Value Date   HGBA1C 5.5 03/26/2016     Imaging: No results found.  Past Medical/Family/Surgical/Social History: Medications & Allergies reviewed per EMR, new medications updated. Patient Active Problem List   Diagnosis Date Noted   Diarrhea 05/17/2023   Nasal congestion 05/17/2023   Former smoker 02/18/2023   Benign prostatic hyperplasia with urinary frequency 02/16/2023   Controlled gout 01/15/2023   Nocturia 01/15/2023   Bilateral hand pain 01/15/2023   Chronic pain of both shoulders 09/03/2022   Bilateral wrist pain 09/03/2022   PVD (peripheral vascular disease) (HCC) 04/30/2022  Panic attacks 04/30/2022   Lumbar spondylosis 03/10/2022   Bilateral inguinal hernia 01/10/2021   Aortic atherosclerosis (HCC) 06/24/2020   Carpal tunnel syndrome, left upper limb 05/15/2020   Primary insomnia 05/19/2019   Controlled substance agreement  signed 05/19/2019   Elevated liver enzymes 06/08/2017   Pacemaker 06/04/2017   Gastroesophageal reflux disease 02/05/2016   Essential hypertension with goal blood pressure less than 130/80 09/18/2013   ATRIAL FIBRILLATION 01/02/2010   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   Past Medical History:  Diagnosis Date   Anxiety    on meds   Aortic atherosclerosis (HCC)    Arthritis    generalized   Bilateral inguinal hernia 01/10/2021   Cancer (HCC)    skin cancers removed   Diverticulosis 01/10/2021   GERD (gastroesophageal reflux disease)    on meds   Hyperlipidemia    on meds   Hypertension    on meds   Overweight(278.02)    obesity   Permanent atrial fibrillation (HCC)    Seasonal allergies    Tachycardia-bradycardia (HCC)    s/p PPM   Family History  Problem Relation Age of Onset   Cancer Mother 43       unknown location   CAD Father 77   Breast cancer Sister    Breast cancer Sister    Stroke Sister    Breast cancer Sister    Breast cancer Sister    Diabetes Brother    Cancer Brother    Alcohol abuse Brother    Heart disease Daughter    Colon polyps Neg Hx    Colon cancer Neg Hx    Esophageal cancer Neg Hx    Stomach cancer Neg Hx    Rectal cancer Neg Hx    Past Surgical History:  Procedure Laterality Date   ARM SKIN LESION BIOPSY / EXCISION Left    Melanoma -removed by Dr. Lurline Hare TUNNEL RELEASE Left 2022   PACEMAKER INSERTION  03/28/2003   St. 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  Tobacco Use   Smoking status: Former    Current packs/day: 0.00    Average packs/day: 1 pack/day for 37.9 years (37.9 ttl pk-yrs)    Types: Cigarettes    Start date: 03/30/1955    Quit date: 03/09/1993     Years since quitting: 30.2    Passive exposure: Past   Smokeless tobacco: Never  Vaping Use   Vaping status: Never Used  Substance and Sexual Activity   Alcohol use: Yes    Alcohol/week: 7.0 standard drinks of alcohol    Types: 1 Glasses of wine, 4 Cans of beer, 2 Shots of liquor per week   Drug use: No   Sexual activity: Not Currently    Partners: Female    Adline Kirshenbaum Fara Boros) Denese Killings, M.D. [facility]

## 2023-06-09 NOTE — Addendum Note (Signed)
 Addended by: Elease Etienne A on: 06/09/2023 01:22 PM   Modules accepted: Orders

## 2023-06-09 NOTE — Progress Notes (Signed)
 Remote pacemaker transmission.

## 2023-06-28 ENCOUNTER — Ambulatory Visit (INDEPENDENT_AMBULATORY_CARE_PROVIDER_SITE_OTHER): Payer: Medicare HMO

## 2023-06-28 VITALS — BP 135/70 | HR 63 | Ht 64.0 in | Wt 185.0 lb

## 2023-06-28 DIAGNOSIS — Z0000 Encounter for general adult medical examination without abnormal findings: Secondary | ICD-10-CM

## 2023-06-28 NOTE — Patient Instructions (Addendum)
 Mr. Birman , Thank you for taking time to come for your Medicare Wellness Visit. I appreciate your ongoing commitment to your health goals. Please review the following plan we discussed and let me know if I can assist you in the future.   Referrals/Orders/Follow-Ups/Clinician Recommendations: As suggested remember to have your ears checked and get your Tetanus vaccine at your nextvisit with your provider, 06/30/23.  This is a list of the screening recommended for you and due dates:  Health Maintenance  Topic Date Due   COVID-19 Vaccine (3 - Moderna risk series) 03/20/2020   Colon Cancer Screening  01/21/2023   DTaP/Tdap/Td vaccine (1 - Tdap) 03/04/2024*   Flu Shot  10/08/2023   Medicare Annual Wellness Visit  06/27/2024   Pneumonia Vaccine  Completed   Zoster (Shingles) Vaccine  Completed   HPV Vaccine  Aged Out   Meningitis B Vaccine  Aged Out  *Topic was postponed. The date shown is not the original due date.    Advanced directives: (Declined) Advance directive discussed with you today. Even though you declined this today, please call our office should you change your mind, and we can give you the proper paperwork for you to fill out.  Next Medicare Annual Wellness Visit scheduled for next year: Yes

## 2023-06-28 NOTE — Progress Notes (Signed)
 Subjective:   Peter Howe is a 83 y.o. who presents for a Medicare Wellness preventive visit.  Visit Complete: Virtual I connected with  Peter Howe on 06/28/23 by a audio enabled telemedicine application and verified that I am speaking with the correct person using two identifiers.  Patient Location: Home  Provider Location: Home Office  I discussed the limitations of evaluation and management by telemedicine. The patient expressed understanding and agreed to proceed.  Vital Signs: Because this visit was a virtual/telehealth visit, some criteria may be missing or patient reported. Any vitals not documented were not able to be obtained and vitals that have been documented are patient reported.  VideoDeclined- This patient declined Librarian, academic. Therefore the visit was completed with audio only.  Persons Participating in Visit: Patient.  AWV Questionnaire: No: Patient Medicare AWV questionnaire was not completed prior to this visit.  Cardiac Risk Factors include: advanced age (>24men, >39 women);obesity (BMI >30kg/m2);male gender;Other (see comment);hypertension, Risk factor comments: ATRIAL FIBRILLATION, pacemaker     Objective:    Today's Vitals   06/28/23 0802  BP: 135/70  Pulse: 63  Weight: 185 lb (83.9 kg)  Height: 5\' 4"  (1.626 m)   Body mass index is 31.76 kg/m.     06/28/2023    8:13 AM 06/24/2022    9:34 AM 04/23/2022    6:13 PM 12/08/2021   11:01 AM 06/05/2021    2:30 PM 01/10/2021    5:08 PM 10/27/2020   11:46 PM  Advanced Directives  Does Patient Have a Medical Advance Directive? No No No Yes No No No  Type of Theme park manager;Living will     Does patient want to make changes to medical advance directive?    No - Patient declined     Copy of Healthcare Power of Attorney in Chart?    No - copy requested     Would patient like information on creating a medical advance directive?  No -  Patient declined   No - Patient declined  No - Patient declined    Current Medications (verified) Outpatient Encounter Medications as of 06/28/2023  Medication Sig   acetaminophen  (TYLENOL ) 325 MG tablet Take 650 mg by mouth every 6 (six) hours as needed for mild pain.   atenolol  (TENORMIN ) 50 MG tablet Take 1 tablet (50 mg total) by mouth daily.   azelastine  (ASTELIN ) 0.1 % nasal spray Place 1 spray into both nostrils 2 (two) times daily. Use in each nostril as directed   colchicine  0.6 MG tablet Take 1.2 mg (2 tablets) by mouth at the first sign of a gout flare followed by 0.6 mg (1 tablet) one hour later.   furosemide  (LASIX ) 20 MG tablet TAKE ONE TABLET BY MOUTH ONCE DAILY   loperamide  (IMODIUM  A-D) 2 MG tablet Take 1 tablet (2 mg total) by mouth 4 (four) times daily as needed for diarrhea or loose stools.   losartan  (COZAAR ) 50 MG tablet Take 1 tablet (50 mg total) by mouth daily.   Multiple Vitamin (MULTIVITAMIN WITH MINERALS) TABS tablet Take 1 tablet by mouth daily.   nitroGLYCERIN  (NITROSTAT ) 0.4 MG SL tablet Place 1 tablet under the tongue every 5 (five) minutes as needed for chest pain.   rosuvastatin  (CRESTOR ) 5 MG tablet Take 1 tablet (5 mg total) by mouth once a week.   sertraline  (ZOLOFT ) 25 MG tablet Take 1 tablet (25 mg total) by mouth daily.   tamsulosin  (  FLOMAX ) 0.4 MG CAPS capsule Take 1 capsule (0.4 mg total) by mouth daily.   XARELTO  20 MG TABS tablet Take 1 tablet (20 mg total) by mouth daily with supper.   zolpidem  (AMBIEN ) 5 MG tablet Take 1 tablet (5 mg total) by mouth at bedtime.   No facility-administered encounter medications on file as of 06/28/2023.    Allergies (verified) Allopurinol , Morphine  and codeine, Penicillins, Statins, and Sulfonamide derivatives   History: Past Medical History:  Diagnosis Date   Anxiety    on meds   Aortic atherosclerosis (HCC)    Arthritis    generalized   Bilateral inguinal hernia 01/10/2021   Cancer (HCC)    skin cancers  removed   Diverticulosis 01/10/2021   GERD (gastroesophageal reflux disease)    on meds   Hyperlipidemia    on meds   Hypertension    on meds   Overweight(278.02)    obesity   Permanent atrial fibrillation (HCC)    Seasonal allergies    Tachycardia-bradycardia (HCC)    s/p PPM   Past Surgical History:  Procedure Laterality Date   ARM SKIN LESION BIOPSY / EXCISION Left    Melanoma -removed by Dr. Roel Clarity TUNNEL RELEASE Left 2022   PACEMAKER INSERTION  03/28/2003   St. Jude-PPM   PPM GENERATOR CHANGEOUT N/A 11/24/2018   Procedure: PPM GENERATOR CHANGEOUT;  Surgeon: Jolly Needle, MD;  Location: [facility];  Service: Cardiovascular;  Laterality: N/A;   Family History  Problem Relation Age of Onset   Cancer Mother 28       unknown location   CAD Father 42   Breast cancer Sister    Breast cancer Sister    Stroke Sister    Breast cancer Sister    Breast cancer Sister    Diabetes Brother    Cancer Brother    Alcohol abuse Brother    Heart disease Daughter    Colon polyps Neg Hx    Colon cancer Neg Hx    Esophageal cancer Neg Hx    Stomach cancer Neg Hx    Rectal cancer Neg Hx    Social History   Socioeconomic History   Marital status: Widowed    Spouse name: Not on file   Number of children: 1   Years of education: 12   Highest education level: High school graduate  Occupational History   Occupation: runs a Materials engineer    Comment: part time  Tobacco Use   Smoking status: Former    Current packs/day: 0.00    Average packs/day: 1 pack/day for 37.9 years (37.9 ttl pk-yrs)    Types: Cigarettes    Start date: 03/30/1955    Quit date: 03/09/1993    Years since quitting: 30.3    Passive exposure: Past   Smokeless tobacco: Never  Vaping Use   Vaping status: Never Used  Substance and Sexual Activity   Alcohol use: Yes    Alcohol/week: 7.0 standard drinks of alcohol    Types: 1 Glasses of wine, 4 Cans of beer, 2 Shots of liquor per week   Drug  use: No   Sexual activity: Not Currently    Partners: Female  Other Topics Concern   Not on file  Social History Narrative   Owns a Materials engineer, enjoys golf.   Lives in [place]   Brother lives 5 miles away   Social Drivers of Health   Financial Resource Strain: Low Risk  (06/28/2023)   Overall  Financial Resource Strain (CARDIA)    Difficulty of Paying Living Expenses: Not hard at all  Food Insecurity: No Food Insecurity (06/28/2023)   Hunger Vital Sign    Worried About Running Out of Food in the Last Year: Never true    Ran Out of Food in the Last Year: Never true  Transportation Needs: No Transportation Needs (06/28/2023)   PRAPARE - Administrator, Civil Service (Medical): No    Lack of Transportation (Non-Medical): No  Physical Activity: Sufficiently Active (06/28/2023)   Exercise Vital Sign    Days of Exercise per Week: 7 days    Minutes of Exercise per Session: 60 min  Stress: No Stress Concern Present (06/28/2023)   [employer] of Occupational Health - Occupational Stress Questionnaire    Feeling of Stress : Only a little  Social Connections: Moderately Integrated (06/28/2023)   Social Connection and Isolation Panel [NHANES]    Frequency of Communication with Friends and Family: More than three times a week    Frequency of Social Gatherings with Friends and Family: More than three times a week    Attends Religious Services: More than 4 times per year    Active Member of [employer] or Organizations: Yes    Attends Banker Meetings: More than 4 times per year    Marital Status: Widowed    Tobacco Counseling Counseling given: Yes    Clinical Intake:  Pre-visit preparation completed: Yes  Pain : No/denies pain (usual arthritis)     BMI - recorded: 31.76 Nutritional Status: BMI > 30  Obese Nutritional Risks: None Diabetes: No  Lab Results  Component Value Date   HGBA1C 5.5 03/26/2016     How often do you need to have someone help  you when you read instructions, pamphlets, or other written materials from your doctor or pharmacy?: 1 - Never  Interpreter Needed?: No  Information entered by :: Belia Bowl, CMA   Activities of Daily Living     06/28/2023    8:05 AM  In your present state of health, do you have any difficulty performing the following activities:  Hearing? 1  Comment both ears alittle, sug to get hearing test done at [facility] on Wed. 06/30/23  Vision? 1  Comment only up close/use Rx & readers [facility] [address] in [place], [place]  Difficulty concentrating or making decisions? 0  Walking or climbing stairs? 0  Comment feels like gets a little winded (SOB) once he reaches the top of the stairs  Dressing or bathing? 0  Doing errands, shopping? 0  Preparing Food and eating ? N  Using the Toilet? N  In the past six months, have you accidently leaked urine? Y  Do you have problems with loss of bowel control? N  Managing your Medications? N  Managing your Finances? N  Housekeeping or managing your Housekeeping? N    Patient Care Team: Galvin Jules, FNP as PCP - General (Family Medicine) Boyce Byes, MD as PCP - Electrophysiology (Cardiology) Lindle Rhea, MD (Inactive) as Consulting Physician (Gastroenterology) Alanda Allegra, MD as Consulting Physician (General Surgery) Shirlee Dotter, MD (Inactive) as Consulting Physician (Orthopedic Surgery) Denman Fischer, MD (Dermatology) Alexia Idler, OD (Optometry)  Indicate any recent Medical Services you may have received from other than [facility] providers in the past year (date may be approximate).     Assessment:   This is a routine wellness examination for [place].  Hearing/Vision screen Hearing Screening - Comments:: Per pt stated  that may have some lack of hearing in both ears/suggested for pt to get hearing test at next visit w/pcp on Wed., 4 Vision Screening - Comments:: Pt uses readers glasses for up close Pt goes to [facility] [address] [facility]  [facility]   Goals Addressed             This Visit's Progress    Patient Stated       Would love to get to playing golf again       Depression Screen     06/28/2023    8:15 AM 03/05/2023    8:46 AM 11/04/2022    8:07 AM 06/24/2022    9:33 AM 04/30/2022    9:54 AM 04/15/2022    3:34 PM 03/10/2022   11:39 AM  PHQ 2/9 Scores  PHQ - 2 Score 2 0 0 0 0 0 0  PHQ- 9 Score 5 3 0 0 3      Fall Risk     06/28/2023    8:14 AM 03/05/2023    8:46 AM 11/04/2022    8:07 AM 06/24/2022    9:15 AM 04/30/2022    9:54 AM  Fall Risk   Falls in the past year? 1 0 0 0 0  Number falls in past yr: 0   0   Injury with Fall? 0   0   Risk for fall due to : No Fall Risks No Fall Risks  No Fall Risks   Follow up Falls prevention discussed;Falls evaluation completed;Education provided Falls evaluation completed  Falls prevention discussed;Education provided;Falls evaluation completed     MEDICARE RISK AT HOME:  Medicare Risk at Home Any stairs in or around the home?: Yes (2 stairs going into the house from garage) If so, are there any without handrails?: No Home free of loose throw rugs in walkways, pet beds, electrical cords, etc?: Yes Adequate lighting in your home to reduce risk of falls?: Yes Life alert?: No Use of a cane, walker or w/c?: No Grab bars in the bathroom?: No Shower chair or bench in shower?: Yes Elevated toilet seat or a handicapped toilet?: Yes  TIMED UP AND GO:  Was the test performed?  No  Cognitive Function: 6CIT completed        06/28/2023    8:22 AM 06/24/2022    9:34 AM 06/05/2021    2:39 PM 06/03/2020    3:36 PM 05/02/2019    8:35 AM  6CIT Screen  What Year? 0 points 0 points 0 points 0 points 0 points  What month? 0 points 0 points 0 points 0 points 0 points  What time? 0 points 0 points 0 points 0 points 0 points  Count back from 20 0 points 0 points 0 points 0 points 0 points  Months in reverse 0 points 0 points 0 points 0 points 0 points  Repeat phrase 0  points 0 points 4 points 0 points 0 points  Total Score 0 points 0 points 4 points 0 points 0 points    Immunizations Immunization History  Administered Date(s) Administered   Fluad Quad(high Dose 65+) 02/05/2016, 12/24/2016, 01/31/2018, 02/26/2020, 01/17/2021, 03/10/2022   Influenza, High Dose Seasonal PF 01/25/2015, 01/25/2015, 02/05/2016, 02/05/2016, 12/24/2016, 12/24/2016, 01/31/2018, 01/31/2018   Influenza, Seasonal, Injecte, Preservative Fre 01/23/2014   Influenza-Unspecified 01/23/2014, 01/31/2018, 12/08/2018   Moderna SARS-COV2 Booster Vaccination 02/21/2020   Moderna Sars-Covid-2 Vaccination 04/01/2019, 04/29/2019, 02/21/2020   Pneumococcal Conjugate-13 12/15/2018   Pneumococcal Polysaccharide-23 04/24/2020   Zoster Recombinant(Shingrix ) 04/25/2021, 10/30/2021  Screening Tests Health Maintenance  Topic Date Due   COVID-19 Vaccine (3 - Moderna risk series) 03/20/2020   Colonoscopy  01/21/2023   DTaP/Tdap/Td (1 - Tdap) 03/04/2024 (Originally 03/30/1959)   INFLUENZA VACCINE  10/08/2023   Medicare Annual Wellness (AWV)  06/27/2024   Pneumonia Vaccine 73+ Years old  Completed   Zoster Vaccines- Shingrix   Completed   HPV VACCINES  Aged Out   Meningococcal B Vaccine  Aged Out    Health Maintenance  Health Maintenance Due  Topic Date Due   COVID-19 Vaccine (3 - Moderna risk series) 03/20/2020   Colonoscopy  01/21/2023   Health Maintenance Items Addressed: See Nurse Notes  Additional Screening:  Vision Screening: Recommended annual ophthalmology exams for early detection of glaucoma and other disorders of the eye.  Dental Screening: Recommended annual dental exams for proper oral hygiene  Community Resource Referral / Chronic Care Management: CRR required this visit?  No   CCM required this visit?  No     Plan:     I have personally reviewed and noted the following in the patient's chart:   Medical and social history Use of alcohol, tobacco or illicit  drugs  Current medications and supplements including opioid prescriptions. Patient is not currently taking opioid prescriptions. Functional ability and status Nutritional status Physical activity Advanced directives List of other physicians Hospitalizations, surgeries, and ER visits in previous 12 months Vitals Screenings to include cognitive, depression, and falls Referrals and appointments  In addition, I have reviewed and discussed with patient certain preventive protocols, quality metrics, and best practice recommendations. A written personalized care plan for preventive services as well as general preventive health recommendations were provided to patient.     Michaelle Adolphus, CMA   06/28/2023   After Visit Summary: (MyChart) Due to this being a telephonic visit, the after visit summary with patients personalized plan was offered to patient via MyChart   Notes:  Pt is aware that he needs to get his Tetanus vaccine up to date, will get it at his next OV w/pcp on 06/30/23.

## 2023-06-30 ENCOUNTER — Ambulatory Visit (INDEPENDENT_AMBULATORY_CARE_PROVIDER_SITE_OTHER): Payer: Medicare HMO | Admitting: Family Medicine

## 2023-06-30 ENCOUNTER — Encounter: Payer: Self-pay | Admitting: Family Medicine

## 2023-06-30 VITALS — BP 135/64 | HR 63 | Temp 97.4°F | Ht 64.0 in | Wt 183.0 lb

## 2023-06-30 DIAGNOSIS — I4821 Permanent atrial fibrillation: Secondary | ICD-10-CM

## 2023-06-30 DIAGNOSIS — Z950 Presence of cardiac pacemaker: Secondary | ICD-10-CM

## 2023-06-30 DIAGNOSIS — K219 Gastro-esophageal reflux disease without esophagitis: Secondary | ICD-10-CM | POA: Diagnosis not present

## 2023-06-30 DIAGNOSIS — I700 Atherosclerosis of aorta: Secondary | ICD-10-CM | POA: Diagnosis not present

## 2023-06-30 DIAGNOSIS — R350 Frequency of micturition: Secondary | ICD-10-CM | POA: Diagnosis not present

## 2023-06-30 DIAGNOSIS — I495 Sick sinus syndrome: Secondary | ICD-10-CM

## 2023-06-30 DIAGNOSIS — M25531 Pain in right wrist: Secondary | ICD-10-CM | POA: Diagnosis not present

## 2023-06-30 DIAGNOSIS — F5101 Primary insomnia: Secondary | ICD-10-CM

## 2023-06-30 DIAGNOSIS — I10 Essential (primary) hypertension: Secondary | ICD-10-CM | POA: Diagnosis not present

## 2023-06-30 DIAGNOSIS — I739 Peripheral vascular disease, unspecified: Secondary | ICD-10-CM | POA: Diagnosis not present

## 2023-06-30 DIAGNOSIS — M25532 Pain in left wrist: Secondary | ICD-10-CM | POA: Diagnosis not present

## 2023-06-30 DIAGNOSIS — N401 Enlarged prostate with lower urinary tract symptoms: Secondary | ICD-10-CM

## 2023-06-30 LAB — LIPID PANEL

## 2023-06-30 MED ORDER — LOSARTAN POTASSIUM 50 MG PO TABS: 50.0000 mg | ORAL_TABLET | Freq: Every day | ORAL | 1 refills | Status: DC

## 2023-06-30 MED ORDER — ZOLPIDEM TARTRATE 5 MG PO TABS: 5.0000 mg | ORAL_TABLET | Freq: Every day | ORAL | 3 refills | Status: DC

## 2023-06-30 MED ORDER — TAMSULOSIN HCL 0.4 MG PO CAPS: 0.4000 mg | ORAL_CAPSULE | Freq: Every day | ORAL | 1 refills | Status: DC

## 2023-06-30 NOTE — Progress Notes (Signed)
 Subjective:  Patient ID: Peter Howe, male    DOB: 02-01-1941, 83 y.o.   MRN: [id number]  Patient Care Team: Galvin Jules, FNP as PCP - General (Family Medicine) Boyce Byes, MD as PCP - Electrophysiology (Cardiology) Lindle Rhea, MD (Inactive) as Consulting Physician (Gastroenterology) Alanda Allegra, MD as Consulting Physician (General Surgery) Shirlee Dotter, MD (Inactive) as Consulting Physician (Orthopedic Surgery) Denman Fischer, MD (Dermatology) Alexia Idler, OD (Optometry)   Chief Complaint:  Insomnia (4 month follow up ) and Medical Management of Chronic Issues   HPI: [name] is a 83 y.o. male presenting on 06/30/2023 for Insomnia (4 month follow up ) and Medical Management of Chronic Issues   History of Present Illness   Peter Howe is an 83 year old male with arthritis and a pacemaker who presents for follow-up of his chronic conditions.  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 Previous treatments include injections in both wrists, most recently in March, which provided some relief but did not fully resolve his symptoms. He also underwent a treatment called Stem Wave, consisting of eleven sessions, which improved finger mobility but not the wrist pain. He describes the pain as 'bearable' but persistent.  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 His last pacemaker check was in January, and an echocardiogram was performed in February, followed by a CT scan, all of which were stable. He continues to take Xarelto  without any issues of bleeding or bruising.  He has peripheral vascular disease and experiences swelling in his legs and feet, for which he takes Lasix  daily. This medication helps manage the swelling. No current symptoms of heaviness, numbness, or tingling in the legs or feet.  His medication regimen includes losartan  and atenolol  for blood pressure, rosuvastatin  for lipid  management, sertraline  for mood, and Flomax  for prostate issues. He gets up once at night to urinate, which is an improvement. He occasionally takes Tums for reflux, which is generally well-controlled. He uses Ambien  at night without experiencing daytime fatigue or confusion upon waking.  He notes some difficulty with hearing, particularly in crowded environments. No changes in vision.          Relevant past medical, surgical, family, and social history reviewed and updated as indicated.  Allergies and medications reviewed and updated. Data reviewed: Chart in Epic.   Past Medical History:  Diagnosis Date   Anxiety    on meds   Aortic atherosclerosis (HCC)    Arthritis    generalized   Bilateral inguinal hernia 01/10/2021   Cancer (HCC)    skin cancers removed   Diverticulosis 01/10/2021   GERD (gastroesophageal reflux disease)    on meds   Hyperlipidemia    on meds   Hypertension    on meds   Overweight(278.02)    obesity   Permanent atrial fibrillation (HCC)    Seasonal allergies    Tachycardia-bradycardia (HCC)    s/p PPM    Past Surgical History:  Procedure Laterality Date   ARM SKIN LESION BIOPSY / EXCISION Left    Melanoma -removed by Dr. Roel Clarity TUNNEL RELEASE Left 2022   PACEMAKER INSERTION  03/28/2003   St. Jude-PPM   PPM GENERATOR CHANGEOUT N/A 11/24/2018   Procedure: PPM GENERATOR CHANGEOUT;  Surgeon: Jolly Needle, MD;  Location: [facility];  Service: Cardiovascular;  Laterality: N/A;    Social History   Socioeconomic History   Marital status:  Widowed    Spouse name: Not on file   Number of children: 1   Years of education: 12   Highest education level: High school graduate  Occupational History   Occupation: runs a Materials engineer    Comment: part time  Tobacco Use   Smoking status: Former    Current packs/day: 0.00    Average packs/day: 1 pack/day for 37.9 years (37.9 ttl pk-yrs)    Types: Cigarettes    Start date: 03/30/1955     Quit date: 03/09/1993    Years since quitting: 30.3    Passive exposure: Past   Smokeless tobacco: Never  Vaping Use   Vaping status: Never Used  Substance and Sexual Activity   Alcohol use: Yes    Alcohol/week: 7.0 standard drinks of alcohol    Types: 1 Glasses of wine, 4 Cans of beer, 2 Shots of liquor per week   Drug use: No   Sexual activity: Not Currently    Partners: Female  Other Topics Concern   Not on file  Social History Narrative   Owns a Materials engineer, enjoys golf.   Lives in [place]   Brother lives 5 miles away   Social Drivers of Health   Financial Resource Strain: Low Risk  (06/28/2023)   Overall Financial Resource Strain (CARDIA)    Difficulty of Paying Living Expenses: Not hard at all  Food Insecurity: No Food Insecurity (06/28/2023)   Hunger Vital Sign    Worried About Running Out of Food in the Last Year: Never true    Ran Out of Food in the Last Year: Never true  Transportation Needs: No Transportation Needs (06/28/2023)   PRAPARE - Administrator, Civil Service (Medical): No    Lack of Transportation (Non-Medical): No  Physical Activity: Sufficiently Active (06/28/2023)   Exercise Vital Sign    Days of Exercise per Week: 7 days    Minutes of Exercise per Session: 60 min  Stress: No Stress Concern Present (06/28/2023)   [employer] of Occupational Health - Occupational Stress Questionnaire    Feeling of Stress : Only a little  Social Connections: Moderately Integrated (06/28/2023)   Social Connection and Isolation Panel [NHANES]    Frequency of Communication with Friends and Family: More than three times a week    Frequency of Social Gatherings with Friends and Family: More than three times a week    Attends Religious Services: More than 4 times per year    Active Member of [employer] or Organizations: Yes    Attends Banker Meetings: More than 4 times per year    Marital Status: Widowed  Intimate Partner Violence: Not At Risk  (06/28/2023)   Humiliation, Afraid, Rape, and Kick questionnaire    Fear of Current or Ex-Partner: No    Emotionally Abused: No    Physically Abused: No    Sexually Abused: No    Outpatient Encounter Medications as of 06/30/2023  Medication Sig   acetaminophen  (TYLENOL ) 325 MG tablet Take 650 mg by mouth every 6 (six) hours as needed for mild pain.   atenolol  (TENORMIN ) 50 MG tablet Take 1 tablet (50 mg total) by mouth daily.   azelastine  (ASTELIN ) 0.1 % nasal spray Place 1 spray into both nostrils 2 (two) times daily. Use in each nostril as directed   colchicine  0.6 MG tablet Take 1.2 mg (2 tablets) by mouth at the first sign of a gout flare followed by 0.6 mg (1 tablet) one hour later.  furosemide  (LASIX ) 20 MG tablet TAKE ONE TABLET BY MOUTH ONCE DAILY   loperamide  (IMODIUM  A-D) 2 MG tablet Take 1 tablet (2 mg total) by mouth 4 (four) times daily as needed for diarrhea or loose stools.   Multiple Vitamin (MULTIVITAMIN WITH MINERALS) TABS tablet Take 1 tablet by mouth daily.   nitroGLYCERIN  (NITROSTAT ) 0.4 MG SL tablet Place 1 tablet under the tongue every 5 (five) minutes as needed for chest pain.   rosuvastatin  (CRESTOR ) 5 MG tablet Take 1 tablet (5 mg total) by mouth once a week.   sertraline  (ZOLOFT ) 25 MG tablet Take 1 tablet (25 mg total) by mouth daily.   XARELTO  20 MG TABS tablet Take 1 tablet (20 mg total) by mouth daily with supper.   losartan  (COZAAR ) 50 MG tablet Take 1 tablet (50 mg total) by mouth daily.   tamsulosin  (FLOMAX ) 0.4 MG CAPS capsule Take 1 capsule (0.4 mg total) by mouth daily.   zolpidem  (AMBIEN ) 5 MG tablet Take 1 tablet (5 mg total) by mouth at bedtime.   [DISCONTINUED] losartan  (COZAAR ) 50 MG tablet Take 1 tablet (50 mg total) by mouth daily.   [DISCONTINUED] tamsulosin  (FLOMAX ) 0.4 MG CAPS capsule Take 1 capsule (0.4 mg total) by mouth daily.   [DISCONTINUED] zolpidem  (AMBIEN ) 5 MG tablet Take 1 tablet (5 mg total) by mouth at bedtime.   No  facility-administered encounter medications on file as of 06/30/2023.    Allergies  Allergen Reactions   Allopurinol  Itching   Morphine  And Codeine Other (See Comments)    Patient states it wired him up and didn't help. Prefers not to take again.   Penicillins Rash    Has patient had a PCN reaction causing immediate rash, facial/tongue/throat swelling, SOB or lightheadedness with hypotension: No Has patient had a PCN reaction causing severe rash involving mucus membranes or skin necrosis: No Has patient had a PCN reaction that required hospitalization No Has patient had a PCN reaction occurring within the last 10 years: No If all of the above answers are "NO", then may proceed with Cephalosporin use.    Statins    Sulfonamide Derivatives Rash    Pertinent ROS per HPI, otherwise unremarkable      Objective:  BP 135/64   Pulse 63   Temp (!) 97.4 F (36.3 C)   Ht 5\' 4"  (1.626 m)   Wt 183 lb (83 kg)   SpO2 96%   BMI 31.41 kg/m    Wt Readings from Last 3 Encounters:  06/30/23 183 lb (83 kg)  06/28/23 185 lb (83.9 kg)  05/17/23 185 lb 6.4 oz (84.1 kg)    Physical Exam Vitals and nursing note reviewed.  Constitutional:      Appearance: Normal appearance. He is obese.  HENT:     Head: Normocephalic and atraumatic.     Nose: Nose normal.     Mouth/Throat:     Mouth: Mucous membranes are moist.     Pharynx: Oropharynx is clear.  Eyes:     Conjunctiva/sclera: Conjunctivae normal.     Pupils: Pupils are equal, round, and reactive to light.  Cardiovascular:     Rate and Rhythm: Normal rate. Rhythm irregularly irregular.     Heart sounds: Normal heart sounds.  Pulmonary:     Effort: Pulmonary effort is normal.     Breath sounds: Normal breath sounds.  Chest:     Comments: Pacemaker pocket well healed Abdominal:     General: Bowel sounds are normal.  Palpations: Abdomen is soft.     Tenderness: There is no abdominal tenderness.  Musculoskeletal:     Cervical back:  Neck supple.     Right lower leg: No edema.     Left lower leg: No edema.  Skin:    General: Skin is warm and dry.     Capillary Refill: Capillary refill takes less than 2 seconds.  Neurological:     General: No focal deficit present.     Mental Status: He is alert and oriented to person, place, and time.  Psychiatric:        Mood and Affect: Mood normal.        Behavior: Behavior normal.        Thought Content: Thought content normal.        Judgment: Judgment normal.     Results for orders placed or performed in visit on 05/04/23  CUP PACEART REMOTE DEVICE CHECK   Collection Time: 05/04/23  2:59 PM  Result Value Ref Range   Date Time Interrogation Session [id number]    Pulse Generator Manufacturer [employer]    Pulse Gen Model 2272 Assurity MRI    Pulse Gen Serial Number [id number]    Clinic Name [facility]    Implantable Pulse Generator Type Implantable Pulse Generator    Implantable Pulse Generator Implant Date 95621308    Implantable Lead Manufacturer [facility]    Implantable Lead Model 1646T IsoFlex S    Implantable Lead Serial Number [id number]    Implantable Lead Implant Date 65784696    Implantable Lead Location Detail 1 UNKNOWN    Implantable Lead Location [id number]    Implantable Lead Connection Status [id number]    Implantable Lead Manufacturer [employer]    Implantable Lead Model 1242T Passive Plus    Implantable Lead Serial Number [id number]    Implantable Lead Implant Date 29528413    Implantable Lead Location Detail 1 UNKNOWN    Implantable Lead Location [id number]    Implantable Lead Connection Status [id number]    Lead Channel Setting Sensing Sensitivity 2.0 mV   Lead Channel Setting Sensing Adaptation Mode Fixed Pacing    Lead Channel Setting Pacing Pulse Width 0.4 ms   Lead Channel Setting Pacing Amplitude 2.5 V   Lead Channel Status NULL    Lead Channel Impedance Value 680 ohm   Lead Channel Sensing Intrinsic Amplitude 12.0 mV   Lead Channel Pacing Threshold Amplitude 0.5 V    Lead Channel Pacing Threshold Pulse Width 0.4 ms   Battery Status MOS    Battery Remaining Longevity 92 mo   Battery Remaining Percentage 70.0 %   Battery Voltage 3.01 V   Brady Statistic RV Percent Paced 64.0 %       Pertinent labs & imaging results that were available during my care of the patient were reviewed by me and considered in my medical decision making.  Assessment & Plan:  Amanuel was seen today for insomnia and medical management of chronic issues.  Diagnoses and all orders for this visit:  Aortic atherosclerosis (HCC) -     CBC with Differential/Platelet -     CMP14+EGFR -     Lipid panel  PVD (peripheral vascular disease) (HCC) -     CBC with Differential/Platelet -     CMP14+EGFR -     Lipid panel  Essential hypertension with goal blood pressure less than 130/80 -     losartan  (COZAAR ) 50 MG tablet; Take 1 tablet (50 mg total) by mouth daily. -  CBC with Differential/Platelet -     CMP14+EGFR -     Lipid panel -     Thyroid  Panel With TSH -     VITAMIN D  25 Hydroxy (Vit-D Deficiency, Fractures)  Primary insomnia -     zolpidem  (AMBIEN ) 5 MG tablet; Take 1 tablet (5 mg total) by mouth at bedtime. -     Thyroid  Panel With TSH  Gastroesophageal reflux disease without esophagitis -     CBC with Differential/Platelet  Benign prostatic hyperplasia with urinary frequency -     tamsulosin  (FLOMAX ) 0.4 MG CAPS capsule; Take 1 capsule (0.4 mg total) by mouth daily. -     PSA, total and free  Bilateral wrist pain -     CMP14+EGFR -     VITAMIN D  25 Hydroxy (Vit-D Deficiency, Fractures)  Permanent atrial fibrillation (HCC) -     CBC with Differential/Platelet -     CMP14+EGFR -     Lipid panel -     Thyroid  Panel With TSH  SICK SINUS/ TACHY-BRADY SYNDROME -     CBC with Differential/Platelet -     CMP14+EGFR -     Lipid panel -     Thyroid  Panel With TSH  Pacemaker -     CBC with Differential/Platelet     Assessment and Plan    Wellness  Visit Routine wellness visit. Reports doing fairly well overall. Blood pressure and weight are stable. No chest pain, leg swelling, or shortness of breath. Hearing is slightly diminished in crowded environments. - Update all labs - Send medications to the pharmacy  Atrial Fibrillation Managed with Xarelto . No changes in bleeding or bruising.  Sick Sinus Syndrome with Pacemaker Pacemaker checked in January and is functioning well. Echocardiogram and cardiac catheterization in February showed no significant issues.  Peripheral Vascular Disease Managed with Lasix  for leg and foot swelling. No heaviness, numbness, or tingling in legs or feet.  Hypertension Blood pressure is well controlled with current medication regimen.  Hyperlipidemia Managed with rosuvastatin  (Crestor ).  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 Previous treatments include corticosteroid injections and Stemway therapy, which provided some relief. Considering acupuncture as an alternative treatment. Discussed potential benefits of acupuncture and the need to inform the acupuncturist about the pacemaker. - Contact an acupuncturist for evaluation and treatment  Benign Prostatic Hyperplasia Managed with Flomax . Reports getting up once at night to urinate, indicating good control.  Gastroesophageal Reflux Disease Well controlled. Occasionally uses antacids like Tums or Rolaids as needed.  Insomnia Managed with Ambien . No daytime fatigue or disorientation.  Depression Managed with sertraline  (Zoloft ). Reports no further problems since starting the medication.          Continue all other maintenance medications.  Follow up plan: Return for 3-4 months for insomnia, chronic follow up .   Continue healthy lifestyle choices, including diet (rich in fruits, vegetables, and lean proteins, and low in salt and simple carbohydrates) and exercise (at least 30 minutes of moderate physical activity daily).  Educational  handout given for insomnia  The above assessment and management plan was discussed with the patient. The patient verbalized understanding of and has agreed to the management plan. Patient is aware to call the clinic if they develop any new symptoms or if symptoms persist or worsen. Patient is aware when to return to the clinic for a follow-up visit. Patient educated on when it is appropriate to go to the emergency department.   Kattie Parrot, FNP-C [facility] [phone]

## 2023-07-01 ENCOUNTER — Other Ambulatory Visit: Payer: Self-pay | Admitting: *Deleted

## 2023-07-01 DIAGNOSIS — D696 Thrombocytopenia, unspecified: Secondary | ICD-10-CM

## 2023-07-01 LAB — CMP14+EGFR
ALT: 27 IU/L (ref 0–44)
AST: 29 IU/L (ref 0–40)
Albumin: 4.1 g/dL (ref 3.7–4.7)
Alkaline Phosphatase: 88 IU/L (ref 44–121)
BUN/Creatinine Ratio: 11 (ref 10–24)
BUN: 13 mg/dL (ref 8–27)
Bilirubin Total: 0.8 mg/dL (ref 0.0–1.2)
CO2: 23 mmol/L (ref 20–29)
Calcium: 9.3 mg/dL (ref 8.6–10.2)
Chloride: 103 mmol/L (ref 96–106)
Creatinine, Ser: 1.15 mg/dL (ref 0.76–1.27)
Globulin, Total: 1.9 g/dL (ref 1.5–4.5)
Glucose: 98 mg/dL (ref 70–99)
Potassium: 3.7 mmol/L (ref 3.5–5.2)
Sodium: 142 mmol/L (ref 134–144)
Total Protein: 6 g/dL (ref 6.0–8.5)
eGFR: 63 mL/min/{1.73_m2} (ref >59)

## 2023-07-01 LAB — CBC WITH DIFFERENTIAL/PLATELET
Basophils Absolute: 0 10*3/uL (ref 0.0–0.2)
Basos: 1 %
EOS (ABSOLUTE): 0.2 10*3/uL (ref 0.0–0.4)
Eos: 3 %
Hematocrit: 42.2 % (ref 37.5–51.0)
Hemoglobin: 14.4 g/dL (ref 13.0–17.7)
Immature Grans (Abs): 0 10*3/uL (ref 0.0–0.1)
Immature Granulocytes: 0 %
Lymphocytes Absolute: 1.1 10*3/uL (ref 0.7–3.1)
Lymphs: 18 %
MCH: 32.8 pg (ref 26.6–33.0)
MCHC: 34.1 g/dL (ref 31.5–35.7)
MCV: 96 fL (ref 79–97)
Monocytes Absolute: 0.6 10*3/uL (ref 0.1–0.9)
Monocytes: 10 %
Neutrophils Absolute: 4.3 10*3/uL (ref 1.4–7.0)
Neutrophils: 68 %
Platelets: 144 10*3/uL — ABNORMAL LOW (ref 150–450)
RBC: 4.39 x10E6/uL (ref 4.14–5.80)
RDW: 13 % (ref 11.6–15.4)
WBC: 6.3 10*3/uL (ref 3.4–10.8)

## 2023-07-01 LAB — VITAMIN D 25 HYDROXY (VIT D DEFICIENCY, FRACTURES): Vit D, 25-Hydroxy: 32.8 ng/mL (ref 30.0–100.0)

## 2023-07-01 LAB — THYROID PANEL WITH TSH
Free Thyroxine Index: 1.6 (ref 1.2–4.9)
T3 Uptake Ratio: 29 % (ref 24–39)
T4, Total: 5.5 ug/dL (ref 4.5–12.0)
TSH: 1.22 u[IU]/mL (ref 0.450–4.500)

## 2023-07-01 LAB — LIPID PANEL
Cholesterol, Total: 165 mg/dL (ref 100–199)
HDL: 104 mg/dL (ref >39)
LDL CALC COMMENT:: 1.6 ratio (ref 0.0–5.0)
LDL Chol Calc (NIH): 50 mg/dL (ref 0–99)
Triglycerides: 56 mg/dL (ref 0–149)
VLDL Cholesterol Cal: 11 mg/dL (ref 5–40)

## 2023-07-01 LAB — PSA, TOTAL AND FREE
PSA, Free Pct: 23.2 %
PSA, Free: 0.44 ng/mL
Prostate Specific Ag, Serum: 1.9 ng/mL (ref 0.0–4.0)

## 2023-07-08 ENCOUNTER — Other Ambulatory Visit

## 2023-07-09 ENCOUNTER — Other Ambulatory Visit

## 2023-07-09 DIAGNOSIS — D696 Thrombocytopenia, unspecified: Secondary | ICD-10-CM | POA: Diagnosis not present

## 2023-07-09 LAB — CBC WITH DIFFERENTIAL/PLATELET
Basophils Absolute: 0 10*3/uL (ref 0.0–0.2)
Basos: 1 %
EOS (ABSOLUTE): 0.2 10*3/uL (ref 0.0–0.4)
Eos: 3 %
Hematocrit: 44.1 % (ref 37.5–51.0)
Hemoglobin: 14.7 g/dL (ref 13.0–17.7)
Immature Grans (Abs): 0 10*3/uL (ref 0.0–0.1)
Immature Granulocytes: 0 %
Lymphocytes Absolute: 1.2 10*3/uL (ref 0.7–3.1)
Lymphs: 20 %
MCH: 33 pg (ref 26.6–33.0)
MCHC: 33.3 g/dL (ref 31.5–35.7)
MCV: 99 fL — ABNORMAL HIGH (ref 79–97)
Monocytes Absolute: 0.6 10*3/uL (ref 0.1–0.9)
Monocytes: 9 %
Neutrophils Absolute: 4.1 10*3/uL (ref 1.4–7.0)
Neutrophils: 67 %
Platelets: 168 10*3/uL (ref 150–450)
RBC: 4.45 x10E6/uL (ref 4.14–5.80)
RDW: 13.3 % (ref 11.6–15.4)
WBC: 6.2 10*3/uL (ref 3.4–10.8)

## 2023-07-13 ENCOUNTER — Encounter: Payer: Self-pay | Admitting: Family Medicine

## 2023-07-27 ENCOUNTER — Other Ambulatory Visit: Payer: Self-pay | Admitting: Family Medicine

## 2023-07-27 DIAGNOSIS — N401 Enlarged prostate with lower urinary tract symptoms: Secondary | ICD-10-CM

## 2023-07-27 DIAGNOSIS — F5101 Primary insomnia: Secondary | ICD-10-CM

## 2023-08-03 ENCOUNTER — Ambulatory Visit (INDEPENDENT_AMBULATORY_CARE_PROVIDER_SITE_OTHER): Payer: Medicare HMO

## 2023-08-03 DIAGNOSIS — I495 Sick sinus syndrome: Secondary | ICD-10-CM

## 2023-08-04 LAB — CUP PACEART REMOTE DEVICE CHECK
Battery Remaining Longevity: 89 mo
Battery Remaining Percentage: 68 %
Battery Voltage: 3.01 V
Brady Statistic RV Percent Paced: 68 %
Date Time Interrogation Session: 20250527074550
Implantable Lead Connection Status: 753985
Implantable Lead Connection Status: 753985
Implantable Lead Implant Date: 19980120
Implantable Lead Implant Date: 20050119
Implantable Lead Location: 753859
Implantable Lead Location: 753860
Implantable Pulse Generator Implant Date: 20200917
Lead Channel Impedance Value: 650 Ohm
Lead Channel Pacing Threshold Amplitude: 0.5 V
Lead Channel Pacing Threshold Pulse Width: 0.4 ms
Lead Channel Sensing Intrinsic Amplitude: 12 mV
Lead Channel Setting Pacing Amplitude: 2.5 V
Lead Channel Setting Pacing Pulse Width: 0.4 ms
Lead Channel Setting Sensing Sensitivity: 2 mV
Pulse Gen Model: 2272
Pulse Gen Serial Number: 9154140

## 2023-08-09 ENCOUNTER — Ambulatory Visit: Payer: Self-pay | Admitting: Cardiology

## 2023-08-18 DIAGNOSIS — C44529 Squamous cell carcinoma of skin of other part of trunk: Secondary | ICD-10-CM | POA: Diagnosis not present

## 2023-08-19 ENCOUNTER — Other Ambulatory Visit: Payer: Self-pay | Admitting: Family Medicine

## 2023-08-19 DIAGNOSIS — R609 Edema, unspecified: Secondary | ICD-10-CM

## 2023-09-02 ENCOUNTER — Other Ambulatory Visit: Payer: Self-pay | Admitting: Family Medicine

## 2023-09-02 DIAGNOSIS — I10 Essential (primary) hypertension: Secondary | ICD-10-CM

## 2023-09-05 ENCOUNTER — Other Ambulatory Visit: Payer: Self-pay | Admitting: Cardiology

## 2023-09-05 DIAGNOSIS — I4821 Permanent atrial fibrillation: Secondary | ICD-10-CM

## 2023-09-06 ENCOUNTER — Ambulatory Visit: Admitting: Orthopedic Surgery

## 2023-09-06 NOTE — Telephone Encounter (Signed)
 Prescription refill request for Xarelto  received.  Indication:afib Last office visit:12/24 Weight:83  kg Age:83 Scr:1.15  4/25 CrCl:57.14  ml/min  Prescription refilled

## 2023-09-14 ENCOUNTER — Ambulatory Visit: Admitting: Orthopedic Surgery

## 2023-09-14 DIAGNOSIS — M19139 Post-traumatic osteoarthritis, unspecified wrist: Secondary | ICD-10-CM | POA: Diagnosis not present

## 2023-09-14 DIAGNOSIS — M180 Bilateral primary osteoarthritis of first carpometacarpal joints: Secondary | ICD-10-CM

## 2023-09-14 MED ORDER — BETAMETHASONE SOD PHOS & ACET 6 (3-3) MG/ML IJ SUSP
6.0000 mg | INTRAMUSCULAR | Status: AC | PRN
  Administered 2023-09-14: 6 mg via INTRA_ARTICULAR

## 2023-09-14 MED ORDER — LIDOCAINE HCL 1 % IJ SOLN
1.0000 mL | INTRAMUSCULAR | Status: AC | PRN
  Administered 2023-09-14: 1 mL

## 2023-09-14 NOTE — Progress Notes (Signed)
 Peter Howe - 83 y.o. male MRN [id number]  Date of birth: 01-Nov-1940  Office Visit Note: Visit Date: 09/14/2023 PCP: Severa Rock HERO, FNP Referred by: Severa Rock HERO, FNP  Subjective: No chief complaint on file.  HPI: Peter Howe is a pleasant 83 y.o. male who returns today for follow-up of bilateral wrist pain and bilateral basilar thumb pain, present for multiple months.  At his prior visit in April of this year, he underwent bilateral cortisone injection to the bilateral thumb CMC joints.  He states that he received significant relief from these injections.  Of note, he did undergo prior radiocarpal injections in October of last year as well.  Today, he is interested in potential repeat injection to the bilateral thumb CMC regions given the significance of relief from the last injections.  Pertinent ROS were reviewed with the patient and found to be negative unless otherwise specified above in HPI.    Assessment & Plan: Visit Diagnoses:  1. Primary osteoarthritis of both first carpometacarpal joints   2. Scapholunate advanced collapse of wrist, unspecified laterality      Plan: Extensive discussion was once again had with patient today regarding his bilateral SLAC wrist arthritis and bilateral thumb CMC arthritis.  He has significant arthritic changes at the radial scaphoid interface as well as the radiolunate interface.  On the left wrist, there is notable degeneration at the capitate surface as well.  I discussed a variety of options with him ranging from conservative to surgical.  From a conservative standpoint, we discussed ongoing utilization of braces and cortisone injection for pain control.  We also discussed anti-inflammatory medication to be utilized as needed.  From a surgical standpoint, we discussed that the SLAC wrist and bilateral thumb CMC arthritis is quite significant, these could be addressed in conjunction surgically, however if we can isolate 1 versus the  other as a more symptomatic region, then this may help speed up recovery postoperatively if we address one of the above.  We discussed utilization of bilateral thumb CMC injections today for both diagnostic and therapeutic measures.  Once again, risk and benefits of cortisone injection were discussed in detail, patient agreed to proceed understanding the above.  Injections were performed to bilateral thumb CMC joints without incident.  I have recommended that he return in approximate 3 months for repeat discussion and recheck.  Follow-up: No follow-ups on file.   Meds & Orders: No orders of the defined types were placed in this encounter.  No orders of the defined types were placed in this encounter.    Procedures: Hand/UE Inj: bilateral thumb CMC for osteoarthritis on 09/14/2023 9:54 PM Indications: pain Details: 25 G needle Medications (Right): 1 mL lidocaine  1 %; 6 mg betamethasone  acetate-betamethasone  sodium phosphate 6 (3-3) MG/ML Medications (Left): 1 mL lidocaine  1 %; 6 mg betamethasone  acetate-betamethasone  sodium phosphate 6 (3-3) MG/ML Outcome: tolerated well, no immediate complications Procedure, treatment alternatives, risks and benefits explained, specific risks discussed.          Clinical History: CT CERVICAL SPINE WITHOUT CONTRAST   TECHNIQUE: Multidetector CT imaging of the cervical spine was performed without intravenous contrast. Multiplanar CT image reconstructions were also generated.   RADIATION DOSE REDUCTION: This exam was performed according to the departmental dose-optimization program which includes automated exposure control, adjustment of the mA and/or kV according to patient size and/or use of iterative reconstruction technique.   COMPARISON:  None Available.   FINDINGS: Alignment: Unremarkable   Skull base and  vertebrae: No acute fracture. No primary bone lesion or focal pathologic process. Subjective osteopenia   Soft tissues and spinal  canal: No evidence of soft tissue mass or acute inflammation.   Disc levels:   C2-3: Facet and uncovertebral spurring on the right where there is moderate foraminal stenosis.   C3-4: Degenerative facet spurring on both sides. Spondylosis. Mild bilateral foraminal narrowing   C4-5: Spondylosis with mild facet spurring. Patent foramina. There may be a central protrusion at this level but no high-grade spinal stenosis suggested on sagittal reformats   C5-6: Spondylosis with mainly ventral spurring. Facet spurring is mild. Patent canal and foramina   C6-7: Greatest level of degenerative disc narrowing with endplate ridging and uncovertebral spurring. Mild right foraminal stenosis   C7-T1:Unremarkable.   Upper chest: No acute finding   IMPRESSION: 1. Ordinary and generalized cervical spine degeneration as described. 2. Moderate foraminal narrowing on the right at C2-3. Patent appearance of the canal and foramina at the highlighted C4-5 level.     Electronically Signed   By: Dorn Roulette M.D.   On: 08/25/2022 05:34  He reports that he quit smoking about 30 years ago. His smoking use included cigarettes. He started smoking about 68 years ago. He has a 37.9 pack-year smoking history. He has been exposed to tobacco smoke. He has never used smokeless tobacco.  Recent Labs    01/15/23 1542  LABURIC 6.2    Objective:   Vital Signs: There were no vitals taken for this visit.  Physical Exam  Gen: Well-appearing, in no acute distress; non-toxic CV: Regular Rate. Well-perfused. Warm.  Resp: Breathing unlabored on room air; no wheezing. Psych: Fluid speech in conversation; appropriate affect; normal thought process  Ortho Exam PHYSICAL EXAM:  General: Patient is well appearing and in no distress.   Skin and Muscle: No significant skin changes are apparent to upper extremities.  Muscle bulk and contour normal.  Range of Motion and Palpation Tests: Mobility is full about  the elbows with flexion and extension.  Forearm supination and pronation are 65/65 bilaterally.   Wrist flexion/extension is limited bilaterally.  Right wrist 45/15, left wrist 45/15.  Digital flexion and extension are full passively.  Thumb opposition is full to the base of the small fingers bilaterally.    Now cords or nodules are palpated.  No triggering is observed.    Significant tenderness over the thumb CMC articulations is observed, no significant MP hyperextension.  Scaphoid shift test is positive for pain and clunk bilaterally.  Neurologic, Vascular, Motor: Sensation is intact to light touch bilateral hands. Fingers pink and well perfused.  Capillary refill is brisk.      Lab Results  Component Value Date   HGBA1C 5.5 03/26/2016     Imaging: No results found.  Past Medical/Family/Surgical/Social History: Medications & Allergies reviewed per EMR, new medications updated. Patient Active Problem List   Diagnosis Date Noted   Diarrhea 05/17/2023   Nasal congestion 05/17/2023   Former smoker 02/18/2023   Benign prostatic hyperplasia with urinary frequency 02/16/2023   Controlled gout 01/15/2023   Nocturia 01/15/2023   Bilateral hand pain 01/15/2023   Chronic pain of both shoulders 09/03/2022   Bilateral wrist pain 09/03/2022   PVD (peripheral vascular disease) (HCC) 04/30/2022   Panic attacks 04/30/2022   Lumbar spondylosis 03/10/2022   Bilateral inguinal hernia 01/10/2021   Aortic atherosclerosis (HCC) 06/24/2020   Carpal tunnel syndrome, left upper limb 05/15/2020   Primary insomnia 05/19/2019   Controlled substance agreement  signed 05/19/2019   Elevated liver enzymes 06/08/2017   Pacemaker 06/04/2017   Gastroesophageal reflux disease 02/05/2016   Essential hypertension with goal blood pressure less than 130/80 09/18/2013   ATRIAL FIBRILLATION 01/02/2010   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   Past Medical History:  Diagnosis Date   Anxiety    on  meds   Aortic atherosclerosis (HCC)    Arthritis    generalized   Bilateral inguinal hernia 01/10/2021   Cancer (HCC)    skin cancers removed   Diverticulosis 01/10/2021   GERD (gastroesophageal reflux disease)    on meds   Hyperlipidemia    on meds   Hypertension    on meds   Overweight(278.02)    obesity   Permanent atrial fibrillation (HCC)    Seasonal allergies    Tachycardia-bradycardia (HCC)    s/p PPM   Family History  Problem Relation Age of Onset   Cancer Mother 77       unknown location   CAD Father 51   Breast cancer Sister    Breast cancer Sister    Stroke Sister    Breast cancer Sister    Breast cancer Sister    Diabetes Brother    Cancer Brother    Alcohol abuse Brother    Heart disease Daughter    Colon polyps Neg Hx    Colon cancer Neg Hx    Esophageal cancer Neg Hx    Stomach cancer Neg Hx    Rectal cancer Neg Hx    Past Surgical History:  Procedure Laterality Date   ARM SKIN LESION BIOPSY / EXCISION Left    Melanoma -removed by Dr. Shona CARRY TUNNEL RELEASE Left 2022   PACEMAKER INSERTION  03/28/2003   St. Jude-PPM   PPM GENERATOR CHANGEOUT N/A 11/24/2018   Procedure: PPM GENERATOR CHANGEOUT;  Surgeon: Kelsie Agent, MD;  Location: [facility];  Service: Cardiovascular;  Laterality: N/A;   Social History   Occupational History   Occupation: runs a [employer]    Comment: part time  Tobacco Use   Smoking status: Former    Current packs/day: 0.00    Average packs/day: 1 pack/day for 37.9 years (37.9 ttl pk-yrs)    Types: Cigarettes    Start date: 03/30/1955    Quit date: 03/09/1993    Years since quitting: 30.5    Passive exposure: Past   Smokeless tobacco: Never  Vaping Use   Vaping status: Never Used  Substance and Sexual Activity   Alcohol use: Yes    Alcohol/week: 7.0 standard drinks of alcohol    Types: 1 Glasses of wine, 4 Cans of beer, 2 Shots of liquor per week   Drug use: No   Sexual activity: Not Currently     Partners: Female    Teoman Giraud Estela) Arlinda, M.D. [facility]

## 2023-09-20 NOTE — Addendum Note (Signed)
 Addended by: TAWNI DRILLING D on: 09/20/2023 02:53 PM   Modules accepted: Orders

## 2023-09-20 NOTE — Progress Notes (Signed)
 Remote pacemaker transmission.

## 2023-09-25 ENCOUNTER — Other Ambulatory Visit: Payer: Self-pay | Admitting: Family Medicine

## 2023-09-25 DIAGNOSIS — F410 Panic disorder [episodic paroxysmal anxiety] without agoraphobia: Secondary | ICD-10-CM

## 2023-10-07 ENCOUNTER — Other Ambulatory Visit: Payer: Self-pay | Admitting: Family Medicine

## 2023-10-07 DIAGNOSIS — I700 Atherosclerosis of aorta: Secondary | ICD-10-CM

## 2023-10-07 DIAGNOSIS — I739 Peripheral vascular disease, unspecified: Secondary | ICD-10-CM

## 2023-10-13 DIAGNOSIS — Z85828 Personal history of other malignant neoplasm of skin: Secondary | ICD-10-CM | POA: Diagnosis not present

## 2023-10-13 DIAGNOSIS — L570 Actinic keratosis: Secondary | ICD-10-CM | POA: Diagnosis not present

## 2023-10-13 DIAGNOSIS — X32XXXD Exposure to sunlight, subsequent encounter: Secondary | ICD-10-CM | POA: Diagnosis not present

## 2023-10-13 DIAGNOSIS — Z08 Encounter for follow-up examination after completed treatment for malignant neoplasm: Secondary | ICD-10-CM | POA: Diagnosis not present

## 2023-11-02 ENCOUNTER — Ambulatory Visit (INDEPENDENT_AMBULATORY_CARE_PROVIDER_SITE_OTHER): Admitting: Family Medicine

## 2023-11-02 ENCOUNTER — Ambulatory Visit (INDEPENDENT_AMBULATORY_CARE_PROVIDER_SITE_OTHER): Payer: Medicare HMO

## 2023-11-02 ENCOUNTER — Encounter: Payer: Self-pay | Admitting: Family Medicine

## 2023-11-02 ENCOUNTER — Other Ambulatory Visit: Payer: Self-pay | Admitting: Family Medicine

## 2023-11-02 VITALS — BP 147/78 | HR 64 | Temp 97.6°F | Ht 64.0 in | Wt 178.6 lb

## 2023-11-02 DIAGNOSIS — Z79899 Other long term (current) drug therapy: Secondary | ICD-10-CM

## 2023-11-02 DIAGNOSIS — Z950 Presence of cardiac pacemaker: Secondary | ICD-10-CM | POA: Diagnosis not present

## 2023-11-02 DIAGNOSIS — F5101 Primary insomnia: Secondary | ICD-10-CM | POA: Diagnosis not present

## 2023-11-02 DIAGNOSIS — I700 Atherosclerosis of aorta: Secondary | ICD-10-CM | POA: Diagnosis not present

## 2023-11-02 DIAGNOSIS — I10 Essential (primary) hypertension: Secondary | ICD-10-CM | POA: Diagnosis not present

## 2023-11-02 DIAGNOSIS — N401 Enlarged prostate with lower urinary tract symptoms: Secondary | ICD-10-CM

## 2023-11-02 DIAGNOSIS — R350 Frequency of micturition: Secondary | ICD-10-CM

## 2023-11-02 DIAGNOSIS — I4821 Permanent atrial fibrillation: Secondary | ICD-10-CM

## 2023-11-02 DIAGNOSIS — F410 Panic disorder [episodic paroxysmal anxiety] without agoraphobia: Secondary | ICD-10-CM | POA: Diagnosis not present

## 2023-11-02 DIAGNOSIS — I739 Peripheral vascular disease, unspecified: Secondary | ICD-10-CM | POA: Diagnosis not present

## 2023-11-02 DIAGNOSIS — I495 Sick sinus syndrome: Secondary | ICD-10-CM

## 2023-11-02 MED ORDER — TAMSULOSIN HCL 0.4 MG PO CAPS: 0.8000 mg | ORAL_CAPSULE | Freq: Every day | ORAL | 1 refills | Status: AC

## 2023-11-02 MED ORDER — LOSARTAN POTASSIUM 50 MG PO TABS
50.0000 mg | ORAL_TABLET | Freq: Every day | ORAL | 1 refills | Status: AC
Start: 2023-11-02 — End: ?

## 2023-11-02 MED ORDER — FUROSEMIDE 20 MG PO TABS: 20.0000 mg | ORAL_TABLET | Freq: Every day | ORAL | 1 refills | Status: AC

## 2023-11-02 MED ORDER — ZOLPIDEM TARTRATE 5 MG PO TABS: 5.0000 mg | ORAL_TABLET | Freq: Every day | ORAL | 5 refills | Status: AC

## 2023-11-02 MED ORDER — SERTRALINE HCL 25 MG PO TABS
25.0000 mg | ORAL_TABLET | Freq: Every day | ORAL | 1 refills | Status: AC
Start: 2023-11-02 — End: ?

## 2023-11-02 MED ORDER — ROSUVASTATIN CALCIUM 5 MG PO TABS: 5.0000 mg | ORAL_TABLET | ORAL | 1 refills | Status: DC

## 2023-11-02 NOTE — Progress Notes (Signed)
 Subjective:  Patient ID: Peter Howe, male    DOB: 12/10/1940, 83 y.o.   MRN: [id number]  Patient Care Team: Severa Rock HERO, FNP as PCP - General (Family Medicine) Cindie Ole DASEN, MD as PCP - Electrophysiology (Cardiology) Eda Iha, MD (Inactive) as Consulting Physician (Gastroenterology) Mavis Anes, MD as Consulting Physician (General Surgery) Anderson Maude LELON, MD (Inactive) as Consulting Physician (Orthopedic Surgery) Shona Rush, MD (Dermatology) Vicci Mcardle, OD (Optometry)   Chief Complaint:  Medical Management of Chronic Issues (3 month chronic follow up )   HPI: Peter Howe is a 83 y.o. male presenting on 11/02/2023 for Medical Management of Chronic Issues (3 month chronic follow up )  Peter Howe is an 83 year old male with atrial fibrillation and a pacemaker who presents for routine follow-up.  He experiences nocturia, getting up two to three times a night to urinate despite being on Flomax  0.4 mg. He has not previously tried increasing the dose.  He is taking Ambien  and sleeps well with it, denying any side effects from the medication.  He is on sertraline  (Zoloft ) and is doing well with it, with no side effects such as headaches or anxiety. His anxiety and panic are well controlled.  He has a history of allergies to statins but is tolerating rosuvastatin  (Crestor ) at a dose of one tablet per week without issues.  He takes Lasix  in the mornings and reports good urine output with it.  He is on Xarelto  for atrial fibrillation and denies any abnormal bleeding or bruising.  No chest pain, leg swelling, or shortness of breath.           11/02/2023    8:14 AM 06/30/2023    8:06 AM 06/28/2023    8:15 AM 03/05/2023    8:46 AM 11/04/2022    8:07 AM  Depression screen PHQ 2/9  Decreased Interest 0 0 1 0 0  Down, Depressed, Hopeless 0 0 1 0 0  PHQ - 2 Score 0 0 2 0 0  Altered sleeping 3 0 1 0 0  Tired, decreased energy 0 0 2 3 0   Change in appetite 0 0 0 0 0  Feeling bad or failure about yourself  0 0 0 0 0  Trouble concentrating 0 0 0 0 0  Moving slowly or fidgety/restless 0 0 0 0 0  Suicidal thoughts 0 0 0 0 0  PHQ-9 Score 3 0 5 3 0  Difficult doing work/chores Not difficult at all Not difficult at all Not difficult at all Not difficult at all Not difficult at all      11/02/2023    8:14 AM 06/30/2023    8:07 AM 03/05/2023    8:47 AM 11/04/2022    8:07 AM  GAD 7 : Generalized Anxiety Score  Nervous, Anxious, on Edge 0 0 0 0  Control/stop worrying 0 0 0 0  Worry too much - different things 0 0  0  Trouble relaxing 0 0  0  Restless 0 0  0  Easily annoyed or irritable 0 0  0  Afraid - awful might happen 0 0  0  Total GAD 7 Score 0 0  0  Anxiety Difficulty Not difficult at all Not difficult at all  Not difficult at all       Relevant past medical, surgical, family, and social history reviewed and updated as indicated.  Allergies and medications reviewed and updated. Data reviewed: Chart in Epic.  Past Medical History:  Diagnosis Date   Anxiety    on meds   Aortic atherosclerosis (HCC)    Arthritis    generalized   Bilateral inguinal hernia 01/10/2021   Cancer (HCC)    skin cancers removed   Diverticulosis 01/10/2021   GERD (gastroesophageal reflux disease)    on meds   Hyperlipidemia    on meds   Hypertension    on meds   Overweight(278.02)    obesity   Permanent atrial fibrillation (HCC)    Seasonal allergies    Tachycardia-bradycardia (HCC)    s/p PPM    Past Surgical History:  Procedure Laterality Date   ARM SKIN LESION BIOPSY / EXCISION Left    Melanoma -removed by Dr. Shona CARRY TUNNEL RELEASE Left 2022   PACEMAKER INSERTION  03/28/2003   [employer]-PPM   PPM GENERATOR CHANGEOUT N/A 11/24/2018   Procedure: PPM GENERATOR CHANGEOUT;  Surgeon: Kelsie Agent, MD;  Location: [facility];  Service: Cardiovascular;  Laterality: N/A;    Social History   Socioeconomic  History   Marital status: Widowed    Spouse name: Not on file   Number of children: 1   Years of education: 12   Highest education level: High school graduate  Occupational History   Occupation: runs a Materials engineer    Comment: part time  Tobacco Use   Smoking status: Former    Current packs/day: 0.00    Average packs/day: 1 pack/day for 37.9 years (37.9 ttl pk-yrs)    Types: Cigarettes    Start date: 03/30/1955    Quit date: 03/09/1993    Years since quitting: 30.6    Passive exposure: Past   Smokeless tobacco: Never  Vaping Use   Vaping status: Never Used  Substance and Sexual Activity   Alcohol use: Yes    Alcohol/week: 7.0 standard drinks of alcohol    Types: 1 Glasses of wine, 4 Cans of beer, 2 Shots of liquor per week   Drug use: No   Sexual activity: Not Currently    Partners: Female  Other Topics Concern   Not on file  Social History Narrative   Owns a Materials engineer, enjoys golf.   Lives in [place]   Brother lives 5 miles away   Social Drivers of Health   Financial Resource Strain: Low Risk  (06/28/2023)   Overall Financial Resource Strain (CARDIA)    Difficulty of Paying Living Expenses: Not hard at all  Food Insecurity: No Food Insecurity (06/28/2023)   Hunger Vital Sign    Worried About Running Out of Food in the Last Year: Never true    Ran Out of Food in the Last Year: Never true  Transportation Needs: No Transportation Needs (06/28/2023)   PRAPARE - Administrator, Civil Service (Medical): No    Lack of Transportation (Non-Medical): No  Physical Activity: Sufficiently Active (06/28/2023)   Exercise Vital Sign    Days of Exercise per Week: 7 days    Minutes of Exercise per Session: 60 min  Stress: No Stress Concern Present (06/28/2023)   [employer] of Occupational Health - Occupational Stress Questionnaire    Feeling of Stress : Only a little  Social Connections: Moderately Integrated (06/28/2023)   Social Connection and Isolation  Panel    Frequency of Communication with Friends and Family: More than three times a week    Frequency of Social Gatherings with Friends and Family: More than three times a week  Attends Religious Services: More than 4 times per year    Active Member of Clubs or Organizations: Yes    Attends Banker Meetings: More than 4 times per year    Marital Status: Widowed  Intimate Partner Violence: Not At Risk (06/28/2023)   Humiliation, Afraid, Rape, and Kick questionnaire    Fear of Current or Ex-Partner: No    Emotionally Abused: No    Physically Abused: No    Sexually Abused: No    Outpatient Encounter Medications as of 11/02/2023  Medication Sig   acetaminophen  (TYLENOL ) 325 MG tablet Take 650 mg by mouth every 6 (six) hours as needed for mild pain.   atenolol  (TENORMIN ) 50 MG tablet Take 1 tablet (50 mg total) by mouth daily.   azelastine  (ASTELIN ) 0.1 % nasal spray Place 1 spray into both nostrils 2 (two) times daily. Use in each nostril as directed   colchicine  0.6 MG tablet Take 1.2 mg (2 tablets) by mouth at the first sign of a gout flare followed by 0.6 mg (1 tablet) one hour later.   furosemide  (LASIX ) 20 MG tablet Take 1 tablet (20 mg total) by mouth daily.   loperamide  (IMODIUM  A-D) 2 MG tablet Take 1 tablet (2 mg total) by mouth 4 (four) times daily as needed for diarrhea or loose stools.   losartan  (COZAAR ) 50 MG tablet Take 1 tablet (50 mg total) by mouth daily.   Multiple Vitamin (MULTIVITAMIN WITH MINERALS) TABS tablet Take 1 tablet by mouth daily.   nitroGLYCERIN  (NITROSTAT ) 0.4 MG SL tablet Place 1 tablet under the tongue every 5 (five) minutes as needed for chest pain.   rosuvastatin  (CRESTOR ) 5 MG tablet Take 1 tablet (5 mg total) by mouth once a week.   sertraline  (ZOLOFT ) 25 MG tablet Take 1 tablet (25 mg total) by mouth daily.   tamsulosin  (FLOMAX ) 0.4 MG CAPS capsule Take 2 capsules (0.8 mg total) by mouth daily.   XARELTO  20 MG TABS tablet Take 1 tablet  (20 mg total) by mouth daily with supper.   zolpidem  (AMBIEN ) 5 MG tablet Take 1 tablet (5 mg total) by mouth at bedtime.   [DISCONTINUED] furosemide  (LASIX ) 20 MG tablet TAKE ONE TABLET BY MOUTH ONCE DAILY   [DISCONTINUED] losartan  (COZAAR ) 50 MG tablet Take 1 tablet (50 mg total) by mouth daily.   [DISCONTINUED] rosuvastatin  (CRESTOR ) 5 MG tablet Take 1 tablet (5 mg total) by mouth once a week.   [DISCONTINUED] sertraline  (ZOLOFT ) 25 MG tablet TAKE ONE TABLET BY MOUTH ONCE DAILY   [DISCONTINUED] tamsulosin  (FLOMAX ) 0.4 MG CAPS capsule Take 1 capsule (0.4 mg total) by mouth daily.   [DISCONTINUED] zolpidem  (AMBIEN ) 5 MG tablet Take 1 tablet (5 mg total) by mouth at bedtime.   No facility-administered encounter medications on file as of 11/02/2023.    Allergies  Allergen Reactions   Allopurinol  Itching   Morphine  And Codeine Other (See Comments)    Patient states it wired him up and didn't help. Prefers not to take again.   Penicillins Rash    Has patient had a PCN reaction causing immediate rash, facial/tongue/throat swelling, SOB or lightheadedness with hypotension: No Has patient had a PCN reaction causing severe rash involving mucus membranes or skin necrosis: No Has patient had a PCN reaction that required hospitalization No Has patient had a PCN reaction occurring within the last 10 years: No If all of the above answers are NO, then may proceed with Cephalosporin use.    Statins  Sulfonamide Derivatives Rash    Pertinent ROS per HPI, otherwise unremarkable      Objective:  BP (!) 147/78   Pulse 64   Temp 97.6 F (36.4 C)   Ht 5' 4 (1.626 m)   Wt 178 lb 9.6 oz (81 kg)   SpO2 96%   BMI 30.66 kg/m    Wt Readings from Last 3 Encounters:  11/02/23 178 lb 9.6 oz (81 kg)  06/30/23 183 lb (83 kg)  06/28/23 185 lb (83.9 kg)    Physical Exam Vitals and nursing note reviewed.  Constitutional:      General: He is not in acute distress.    Appearance: Normal  appearance. He is obese. He is not ill-appearing, toxic-appearing or diaphoretic.  HENT:     Head: Normocephalic and atraumatic.     Nose: Nose normal.     Mouth/Throat:     Mouth: Mucous membranes are moist.  Eyes:     Conjunctiva/sclera: Conjunctivae normal.     Pupils: Pupils are equal, round, and reactive to light.  Cardiovascular:     Rate and Rhythm: Normal rate. Rhythm irregularly irregular.     Heart sounds: Normal heart sounds.     Comments: Pacemaker pocket well healed Abdominal:     General: Abdomen is protuberant. Bowel sounds are normal.     Palpations: Abdomen is soft.  Musculoskeletal:     Cervical back: Neck supple.     Right lower leg: No edema.     Left lower leg: No edema.  Skin:    General: Skin is warm and dry.     Capillary Refill: Capillary refill takes less than 2 seconds.  Neurological:     General: No focal deficit present.     Mental Status: He is alert and oriented to person, place, and time.  Psychiatric:        Mood and Affect: Mood normal.        Behavior: Behavior normal.        Thought Content: Thought content normal.        Judgment: Judgment normal.      Results for orders placed or performed in visit on 08/03/23  CUP PACEART REMOTE DEVICE CHECK   Collection Time: 08/03/23  7:45 AM  Result Value Ref Range   Date Time Interrogation Session [id number]    Pulse Generator Manufacturer [employer]    Pulse Gen Model 2272 Assurity MRI    Pulse Gen Serial Number [id number]    Clinic Name [facility]    Implantable Pulse Generator Type Implantable Pulse Generator    Implantable Pulse Generator Implant Date 79799082    Implantable Lead Manufacturer [facility]    Implantable Lead Model 1646T IsoFlex S    Implantable Lead Serial Number [id number]    Implantable Lead Implant Date 79949880    Implantable Lead Location Detail 1 UNKNOWN    Implantable Lead Location [id number]    Implantable Lead Connection Status [id number]    Implantable Lead Manufacturer PACE     Implantable Lead Model 1242T Passive Plus    Implantable Lead Serial Number [id number]    Implantable Lead Implant Date 80019879    Implantable Lead Location Detail 1 UNKNOWN    Implantable Lead Location [id number]    Implantable Lead Connection Status [id number]    Lead Channel Setting Sensing Sensitivity 2.0 mV   Lead Channel Setting Sensing Adaptation Mode Fixed Pacing    Lead Channel Setting Pacing Pulse Width 0.4 ms   Lead Channel Setting Pacing  Amplitude 2.5 V   Lead Channel Status NULL    Lead Channel Impedance Value 650 ohm   Lead Channel Sensing Intrinsic Amplitude 12.0 mV   Lead Channel Pacing Threshold Amplitude 0.5 V   Lead Channel Pacing Threshold Pulse Width 0.4 ms   Battery Status MOS    Battery Remaining Longevity 89 mo   Battery Remaining Percentage 68.0 %   Battery Voltage 3.01 V   Brady Statistic RV Percent Paced 68.0 %       Pertinent labs & imaging results that were available during my care of the patient were reviewed by me and considered in my medical decision making.  Assessment & Plan:  Peter Howe was seen today for medical management of chronic issues.  Diagnoses and all orders for this visit:  Permanent atrial fibrillation (HCC) -     rosuvastatin  (CRESTOR ) 5 MG tablet; Take 1 tablet (5 mg total) by mouth once a week.  Primary insomnia -     zolpidem  (AMBIEN ) 5 MG tablet; Take 1 tablet (5 mg total) by mouth at bedtime. -     ToxASSURE Select 13 (MW), Urine  Controlled substance agreement signed -     zolpidem  (AMBIEN ) 5 MG tablet; Take 1 tablet (5 mg total) by mouth at bedtime. -     ToxASSURE Select 13 (MW), Urine  Benign prostatic hyperplasia with urinary frequency -     tamsulosin  (FLOMAX ) 0.4 MG CAPS capsule; Take 2 capsules (0.8 mg total) by mouth daily.  Panic attacks -     sertraline  (ZOLOFT ) 25 MG tablet; Take 1 tablet (25 mg total) by mouth daily.  PVD (peripheral vascular disease) (HCC) -     rosuvastatin  (CRESTOR ) 5 MG tablet; Take 1 tablet  (5 mg total) by mouth once a week.  Aortic atherosclerosis (HCC) -     rosuvastatin  (CRESTOR ) 5 MG tablet; Take 1 tablet (5 mg total) by mouth once a week.  Essential hypertension with goal blood pressure less than 130/80 -     losartan  (COZAAR ) 50 MG tablet; Take 1 tablet (50 mg total) by mouth daily. -     furosemide  (LASIX ) 20 MG tablet; Take 1 tablet (20 mg total) by mouth daily.  SICK SINUS/ TACHY-BRADY SYNDROME -     rosuvastatin  (CRESTOR ) 5 MG tablet; Take 1 tablet (5 mg total) by mouth once a week.  Pacemaker        Benign prostatic hyperplasia with lower urinary tract symptoms Reports nocturia, getting up two to three times a night. Currently on tamsulosin  0.4 mg. No previous trial of increased dosage. Discussed potential for increased dosage to 0.8 mg to improve symptoms, with caution regarding potential blood pressure drop. - Increase tamsulosin  to 0.8 mg nightly - Send prescription for tamsulosin  0.8 mg  Atrial fibrillation On Xarelto  for anticoagulation. No reports of abnormal bleeding or bruising. Pacemaker checks are done remotely and are up to date with good battery life. - Continue Xarelto  - Continue remote pacemaker checks  Insomnia Reports doing well on Ambien  with good sleep quality. - Continue Ambien  - Refill Ambien  prescription  Major depressive disorder, well controlled On sertraline  (Zoloft ) and reports no side effects such as headaches or anxiety. - Continue sertraline   Hyperlipidemia, statin intolerance, on rosuvastatin  Statin intolerance but is tolerating rosuvastatin  at a dose of once a week. No adverse effects reported. - Continue rosuvastatin  once a week          Continue all other maintenance medications.  Follow up plan: Return if  symptoms worsen or fail to improve.   Continue healthy lifestyle choices, including diet (rich in fruits, vegetables, and lean proteins, and low in salt and simple carbohydrates) and exercise (at least 30  minutes of moderate physical activity daily).  Educational handout given for health maintenance   The above assessment and management plan was discussed with the patient. The patient verbalized understanding of and has agreed to the management plan. Patient is aware to call the clinic if they develop any new symptoms or if symptoms persist or worsen. Patient is aware when to return to the clinic for a follow-up visit. Patient educated on when it is appropriate to go to the emergency department.   Rosaline Bruns, FNP-C [facility] [phone]

## 2023-11-03 ENCOUNTER — Ambulatory Visit: Payer: Self-pay | Admitting: Cardiology

## 2023-11-03 LAB — CUP PACEART REMOTE DEVICE CHECK
Battery Remaining Longevity: 86 mo
Battery Remaining Percentage: 66 %
Battery Voltage: 3.01 V
Brady Statistic RV Percent Paced: 70 %
Date Time Interrogation Session: 20250826031547
Implantable Lead Connection Status: 753985
Implantable Lead Connection Status: 753985
Implantable Lead Implant Date: 19980120
Implantable Lead Implant Date: 20050119
Implantable Lead Location: 753859
Implantable Lead Location: 753860
Implantable Pulse Generator Implant Date: 20200917
Lead Channel Impedance Value: 680 Ohm
Lead Channel Pacing Threshold Amplitude: 0.5 V
Lead Channel Pacing Threshold Pulse Width: 0.4 ms
Lead Channel Sensing Intrinsic Amplitude: 12 mV
Lead Channel Setting Pacing Amplitude: 2.5 V
Lead Channel Setting Pacing Pulse Width: 0.4 ms
Lead Channel Setting Sensing Sensitivity: 2 mV
Pulse Gen Model: 2272
Pulse Gen Serial Number: 9154140

## 2023-11-05 LAB — TOXASSURE SELECT 13 (MW), URINE

## 2023-11-14 ENCOUNTER — Other Ambulatory Visit: Payer: Self-pay | Admitting: Family Medicine

## 2023-11-14 DIAGNOSIS — I10 Essential (primary) hypertension: Secondary | ICD-10-CM

## 2023-11-23 NOTE — Progress Notes (Signed)
 Remote PPM Transmission

## 2023-12-23 NOTE — Progress Notes (Signed)
 Peter Howe                                          MRN: [id number]   12/23/2023   The [facility]I Quality Team Specialist reviewed this patient medical record for the purposes of chart review for care gap closure. The following were reviewed: chart review for care gap closure-controlling blood pressure.    VBCI Quality Team

## 2023-12-27 ENCOUNTER — Other Ambulatory Visit: Payer: Self-pay | Admitting: Family Medicine

## 2023-12-27 DIAGNOSIS — I739 Peripheral vascular disease, unspecified: Secondary | ICD-10-CM

## 2023-12-27 DIAGNOSIS — I495 Sick sinus syndrome: Secondary | ICD-10-CM

## 2023-12-27 DIAGNOSIS — I4821 Permanent atrial fibrillation: Secondary | ICD-10-CM

## 2023-12-27 DIAGNOSIS — I700 Atherosclerosis of aorta: Secondary | ICD-10-CM

## 2024-01-10 ENCOUNTER — Encounter: Payer: Self-pay | Admitting: Radiology

## 2024-01-17 DIAGNOSIS — H524 Presbyopia: Secondary | ICD-10-CM | POA: Diagnosis not present

## 2024-01-17 DIAGNOSIS — H2513 Age-related nuclear cataract, bilateral: Secondary | ICD-10-CM | POA: Diagnosis not present

## 2024-01-17 DIAGNOSIS — H52223 Regular astigmatism, bilateral: Secondary | ICD-10-CM | POA: Diagnosis not present

## 2024-01-17 DIAGNOSIS — D3131 Benign neoplasm of right choroid: Secondary | ICD-10-CM | POA: Diagnosis not present

## 2024-01-17 DIAGNOSIS — H5203 Hypermetropia, bilateral: Secondary | ICD-10-CM | POA: Diagnosis not present

## 2024-02-01 ENCOUNTER — Ambulatory Visit (INDEPENDENT_AMBULATORY_CARE_PROVIDER_SITE_OTHER)

## 2024-02-01 ENCOUNTER — Ambulatory Visit: Payer: Medicare HMO

## 2024-02-01 DIAGNOSIS — Z23 Encounter for immunization: Secondary | ICD-10-CM | POA: Diagnosis not present

## 2024-02-01 DIAGNOSIS — I4821 Permanent atrial fibrillation: Secondary | ICD-10-CM | POA: Diagnosis not present

## 2024-02-01 LAB — CUP PACEART REMOTE DEVICE CHECK
Battery Remaining Longevity: 84 mo
Battery Remaining Percentage: 64 %
Battery Voltage: 3.01 V
Brady Statistic RV Percent Paced: 70 %
Date Time Interrogation Session: 20251125045605
Implantable Lead Connection Status: 753985
Implantable Lead Connection Status: 753985
Implantable Lead Implant Date: 19980120
Implantable Lead Implant Date: 20050119
Implantable Lead Location: 753859
Implantable Lead Location: 753860
Implantable Pulse Generator Implant Date: 20200917
Lead Channel Impedance Value: 690 Ohm
Lead Channel Pacing Threshold Amplitude: 0.5 V
Lead Channel Pacing Threshold Pulse Width: 0.4 ms
Lead Channel Sensing Intrinsic Amplitude: 12 mV
Lead Channel Setting Pacing Amplitude: 2.5 V
Lead Channel Setting Pacing Pulse Width: 0.4 ms
Lead Channel Setting Sensing Sensitivity: 2 mV
Pulse Gen Model: 2272
Pulse Gen Serial Number: 9154140

## 2024-02-02 NOTE — Progress Notes (Signed)
 Remote PPM Transmission

## 2024-02-08 ENCOUNTER — Ambulatory Visit: Payer: Self-pay | Admitting: Cardiovascular Disease

## 2024-02-08 DIAGNOSIS — L821 Other seborrheic keratosis: Secondary | ICD-10-CM | POA: Diagnosis not present

## 2024-02-08 DIAGNOSIS — L570 Actinic keratosis: Secondary | ICD-10-CM | POA: Diagnosis not present

## 2024-02-08 DIAGNOSIS — X32XXXD Exposure to sunlight, subsequent encounter: Secondary | ICD-10-CM | POA: Diagnosis not present

## 2024-02-19 DIAGNOSIS — R350 Frequency of micturition: Secondary | ICD-10-CM | POA: Diagnosis not present

## 2024-02-25 ENCOUNTER — Other Ambulatory Visit: Payer: Self-pay | Admitting: Cardiology

## 2024-02-29 ENCOUNTER — Encounter: Payer: Self-pay | Admitting: Cardiology

## 2024-02-29 ENCOUNTER — Ambulatory Visit: Attending: Cardiology | Admitting: Cardiology

## 2024-02-29 VITALS — BP 127/73 | HR 63 | Ht 64.0 in | Wt 178.8 lb

## 2024-02-29 DIAGNOSIS — Z950 Presence of cardiac pacemaker: Secondary | ICD-10-CM

## 2024-02-29 DIAGNOSIS — I4821 Permanent atrial fibrillation: Secondary | ICD-10-CM | POA: Diagnosis not present

## 2024-02-29 DIAGNOSIS — I10 Essential (primary) hypertension: Secondary | ICD-10-CM | POA: Diagnosis not present

## 2024-02-29 DIAGNOSIS — R001 Bradycardia, unspecified: Secondary | ICD-10-CM | POA: Diagnosis not present

## 2024-02-29 LAB — CUP PACEART INCLINIC DEVICE CHECK
Battery Remaining Longevity: 82 mo
Battery Voltage: 3.01 V
Brady Statistic RA Percent Paced: 0 %
Brady Statistic RV Percent Paced: 71 %
Date Time Interrogation Session: 20251223112054
Implantable Lead Connection Status: 753985
Implantable Lead Connection Status: 753985
Implantable Lead Implant Date: 19980120
Implantable Lead Implant Date: 20050119
Implantable Lead Location: 753859
Implantable Lead Location: 753860
Implantable Pulse Generator Implant Date: 20200917
Lead Channel Impedance Value: 675 Ohm
Lead Channel Pacing Threshold Amplitude: 0.5 V
Lead Channel Pacing Threshold Amplitude: 0.5 V
Lead Channel Pacing Threshold Pulse Width: 0.4 ms
Lead Channel Pacing Threshold Pulse Width: 0.4 ms
Lead Channel Sensing Intrinsic Amplitude: 12 mV
Lead Channel Setting Pacing Amplitude: 2.5 V
Lead Channel Setting Pacing Pulse Width: 0.4 ms
Lead Channel Setting Sensing Sensitivity: 2 mV
Pulse Gen Model: 2272
Pulse Gen Serial Number: 9154140

## 2024-02-29 NOTE — Patient Instructions (Signed)

## 2024-02-29 NOTE — Progress Notes (Signed)
" °  Electrophysiology Office Follow up Visit Note:    Date:  02/29/2024   ID:  Peter Howe, Peter Howe 07-24-1940, MRN [id number]  PCP:  Severa Rock HERO, FNP  [facility] Cardiologist:  None  [facility] Electrophysiologist:  OLE ONEIDA HOLTS, MD    Interval History:     Peter Howe is a 83 y.o. male who presents for a follow up visit.   The patient was last seen by [facility] February 23, 2023.  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  He takes Xarelto  for stroke prophylaxis. He is doing well today.  No problems with his device.  He tell me about his 25 and 62-year-old grandchildren who he helps take care of frequently in [place].       Past medical, surgical, social and family history were reviewed.  ROS:   Please see the history of present illness.    All other systems reviewed and are negative.  EKGs/Labs/Other Studies Reviewed:    The following studies were reviewed today:  February 29, 2024 in-clinic device interrogation personally reviewed Battery and lead parameter stable.  March 24, 2023 echo Normal left ventricular function Trivial MR No significant valve abnormalities EKG Interpretation Date/Time:  Tuesday February 29 2024 11:14:50 EST Ventricular Rate:  63 PR Interval:    QRS Duration:  84 QT Interval:  402 QTC Calculation: 411 R Axis:   18  Text Interpretation: Atrial fibrillation with frequent ventricular-paced complexes Confirmed by Holts Ole [phone]) on 02/29/2024 11:24:15 AM    Physical Exam:    VS:  BP 127/73   Pulse 63   Ht 5' 4 (1.626 m)   Wt 178 lb 12.8 oz (81.1 kg)   SpO2 98%   BMI 30.69 kg/m     Wt Readings from Last 3 Encounters:  02/29/24 178 lb 12.8 oz (81.1 kg)  11/02/23 178 lb 9.6 oz (81 kg)  06/30/23 183 lb (83 kg)     GEN: no distress CARD: Irregularly irregular, No MRG.  Generator pocket well-healed RESP: No IWOB. CTAB.       ASSESSMENT:    1. Permanent atrial fibrillation (HCC)   2. Symptomatic bradycardia   3. Cardiac pacemaker in situ   4. 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  Continue remote monitoring  #Permanent atrial fibrillation On Xarelto , continue  #Hypertension At goal today.  Recommend checking blood pressures 1-2 times per week at home and recording the values.  Recommend bringing these recordings to the primary care physician.  I discussed my upcoming departure from [facility] during today's clinic appointment.  The patient will continue to follow-up with one of my EP partners moving forward.  Follow-up 1 year with EP APP   Signed, Ole Holts, MD, [facility], [facility] 02/29/2024 11:25 AM    Electrophysiology [facility] "

## 2024-03-08 ENCOUNTER — Ambulatory Visit: Payer: Self-pay | Admitting: Cardiovascular Disease

## 2024-03-19 NOTE — Progress Notes (Unsigned)
 Follow up bilateral [facility]; Injections bilaterally 09/14/23

## 2024-03-20 ENCOUNTER — Ambulatory Visit: Admitting: Orthopedic Surgery

## 2024-03-20 DIAGNOSIS — M19139 Post-traumatic osteoarthritis, unspecified wrist: Secondary | ICD-10-CM | POA: Diagnosis not present

## 2024-03-20 DIAGNOSIS — M25532 Pain in left wrist: Secondary | ICD-10-CM | POA: Diagnosis not present

## 2024-03-20 DIAGNOSIS — M180 Bilateral primary osteoarthritis of first carpometacarpal joints: Secondary | ICD-10-CM

## 2024-03-20 DIAGNOSIS — M25531 Pain in right wrist: Secondary | ICD-10-CM | POA: Diagnosis not present

## 2024-03-20 MED ORDER — BETAMETHASONE SOD PHOS & ACET 6 (3-3) MG/ML IJ SUSP
6.0000 mg | INTRAMUSCULAR | Status: AC | PRN
  Administered 2024-03-20: 6 mg via INTRA_ARTICULAR

## 2024-03-20 MED ORDER — LIDOCAINE HCL 1 % IJ SOLN
1.0000 mL | INTRAMUSCULAR | Status: AC | PRN
  Administered 2024-03-20: 1 mL

## 2024-04-04 ENCOUNTER — Other Ambulatory Visit: Payer: Self-pay | Admitting: Family Medicine

## 2024-04-05 ENCOUNTER — Ambulatory Visit: Admitting: Orthopedic Surgery

## 2024-04-05 NOTE — Progress Notes (Signed)
 Peter Howe                                          MRN: [id number]   04/05/2024   The VBCI Quality Team Specialist reviewed this patient medical record for the purposes of chart review for care gap closure. The following were reviewed: abstraction for care gap closure-controlling blood pressure.    VBCI Quality Team

## 2024-04-06 ENCOUNTER — Telehealth: Payer: Self-pay | Admitting: Cardiovascular Disease

## 2024-04-06 NOTE — Telephone Encounter (Signed)
" °*  STAT* If patient is at the pharmacy, call can be transferred to refill team.   1. Which medications need to be refilled? (please list name of each medication and dose if known) atenolol  (TENORMIN ) 50 MG tablet   4. Which pharmacy/location (including street and city if local pharmacy) is medication to be sent to?  [facility], [place] - [address]     5. Do they need a 30 day or 90 day supply? 90   "

## 2024-04-07 MED ORDER — ATENOLOL 50 MG PO TABS: 50.0000 mg | ORAL_TABLET | Freq: Every day | ORAL | 3 refills | Status: AC

## 2024-04-07 NOTE — Telephone Encounter (Signed)
 Refill sent

## 2024-05-02 ENCOUNTER — Ambulatory Visit: Payer: Medicare HMO

## 2024-05-04 ENCOUNTER — Ambulatory Visit: Payer: Self-pay | Admitting: Family Medicine

## 2024-08-01 ENCOUNTER — Ambulatory Visit

## 2024-10-31 ENCOUNTER — Ambulatory Visit

## 2025-01-30 ENCOUNTER — Ambulatory Visit

## 2025-05-01 ENCOUNTER — Ambulatory Visit

## 2025-07-31 ENCOUNTER — Ambulatory Visit

## 2025-10-30 ENCOUNTER — Ambulatory Visit
